# Patient Record
Sex: Male | Born: 1945
Health system: Southern US, Community
[De-identification: ages and names within clinical notes are randomized; demographics above are authoritative.]

## PROBLEM LIST (undated history)

## (undated) DIAGNOSIS — C4491 Basal cell carcinoma of skin, unspecified: Secondary | ICD-10-CM

## (undated) DIAGNOSIS — K219 Gastro-esophageal reflux disease without esophagitis: Secondary | ICD-10-CM

## (undated) DIAGNOSIS — Z85528 Personal history of other malignant neoplasm of kidney: Secondary | ICD-10-CM

## (undated) DIAGNOSIS — T7840XA Allergy, unspecified, initial encounter: Secondary | ICD-10-CM

## (undated) DIAGNOSIS — I10 Essential (primary) hypertension: Secondary | ICD-10-CM

## (undated) HISTORY — PX: TONSILLECTOMY: SUR1361

## (undated) HISTORY — PX: VASECTOMY: SHX75

## (undated) HISTORY — DX: Personal history of other malignant neoplasm of kidney: Z85.528

## (undated) HISTORY — PX: HERNIA REPAIR: SHX51

## (undated) HISTORY — PX: CATARACT EXTRACTION: SUR2

## (undated) HISTORY — DX: Allergy, unspecified, initial encounter: T78.40XA

## (undated) HISTORY — PX: KIDNEY SURGERY: SHX687

## (undated) HISTORY — DX: Essential (primary) hypertension: I10

## (undated) HISTORY — DX: Basal cell carcinoma of skin, unspecified: C44.91

## (undated) HISTORY — DX: Gastro-esophageal reflux disease without esophagitis: K21.9

---

## 2012-03-09 ENCOUNTER — Ambulatory Visit (INDEPENDENT_AMBULATORY_CARE_PROVIDER_SITE_OTHER): Payer: Medicare Other | Admitting: Internal Medicine

## 2012-03-09 ENCOUNTER — Ambulatory Visit: Payer: Medicare Other

## 2012-03-09 VITALS — BP 133/81 | HR 68 | Resp 18 | Ht >= 80 in | Wt 225.0 lb

## 2012-03-09 DIAGNOSIS — S0180XA Unspecified open wound of other part of head, initial encounter: Secondary | ICD-10-CM

## 2012-03-09 DIAGNOSIS — I10 Essential (primary) hypertension: Secondary | ICD-10-CM

## 2012-03-09 DIAGNOSIS — S0003XA Contusion of scalp, initial encounter: Secondary | ICD-10-CM

## 2012-03-09 DIAGNOSIS — Z23 Encounter for immunization: Secondary | ICD-10-CM

## 2012-03-09 DIAGNOSIS — S0083XA Contusion of other part of head, initial encounter: Secondary | ICD-10-CM

## 2012-03-09 IMAGING — CR DG NASAL BONES 3+V
2 series · 2 of 2 positions shown · non-contrast
Comparison: None.

CLINICAL DATA: Fall today.  Nasal laceration.

NASAL BONES - 3+ VIEW

[ap waters]
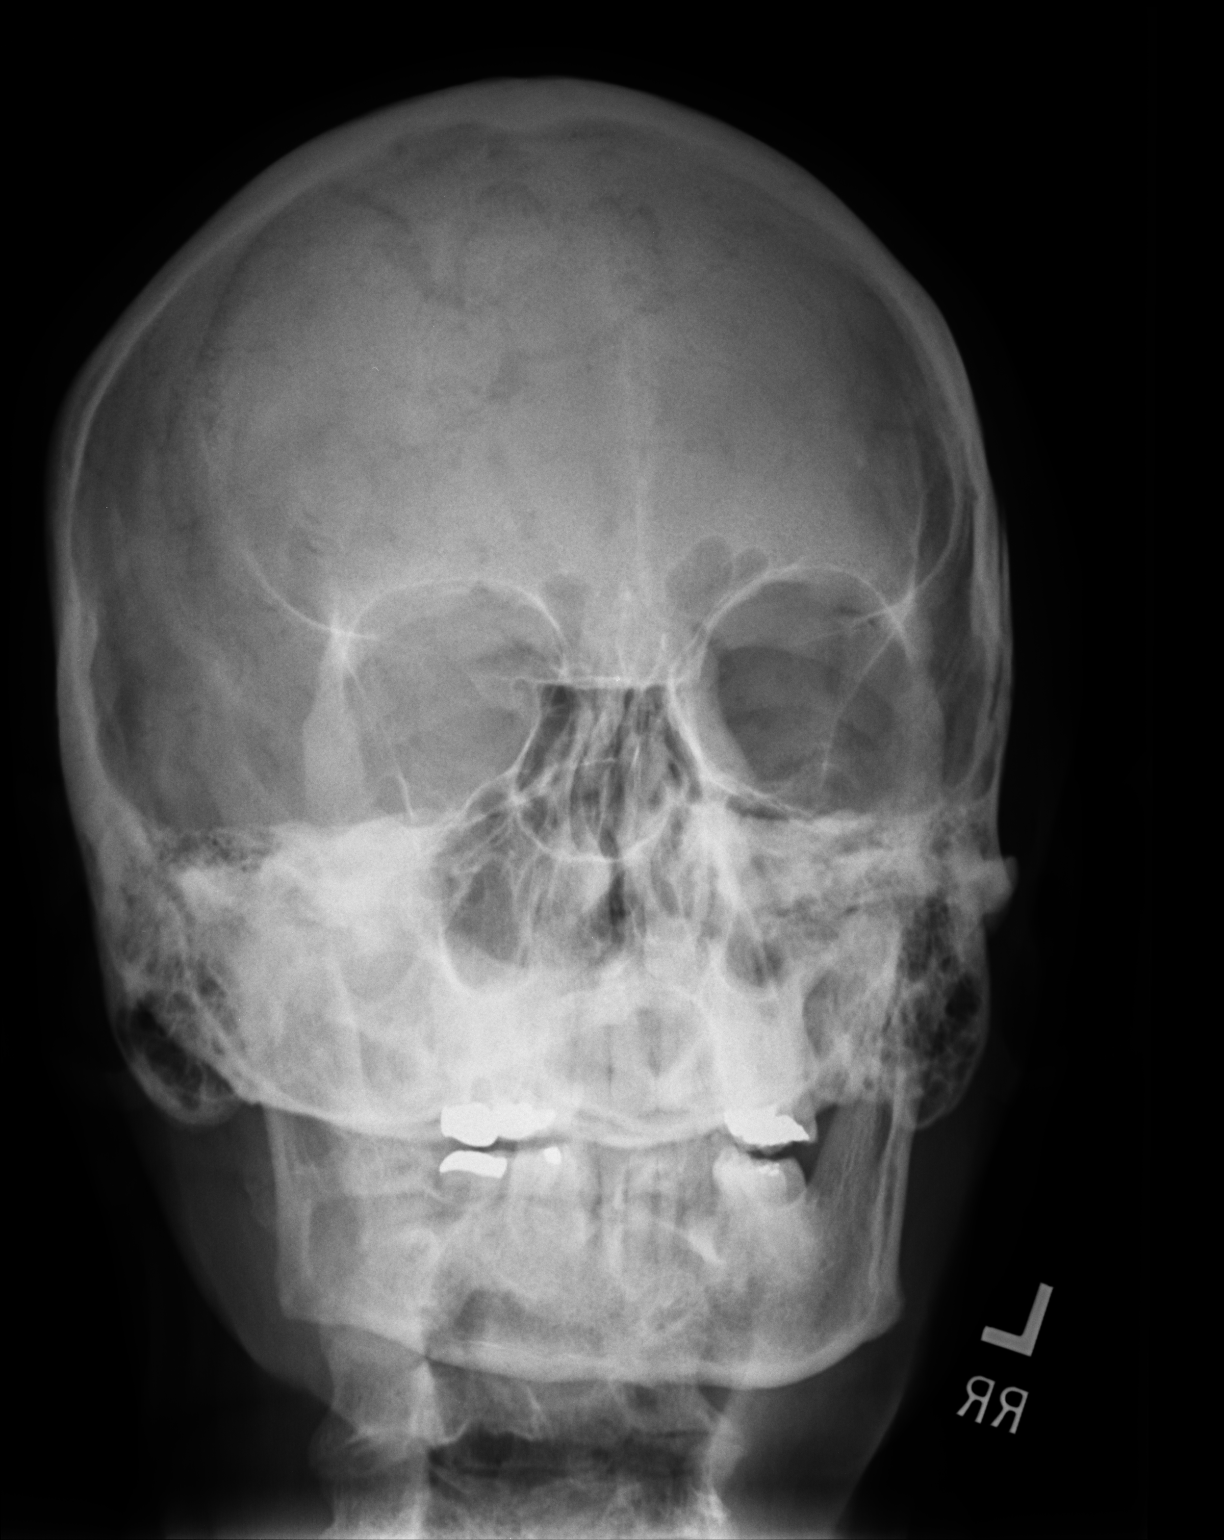

[lateral]
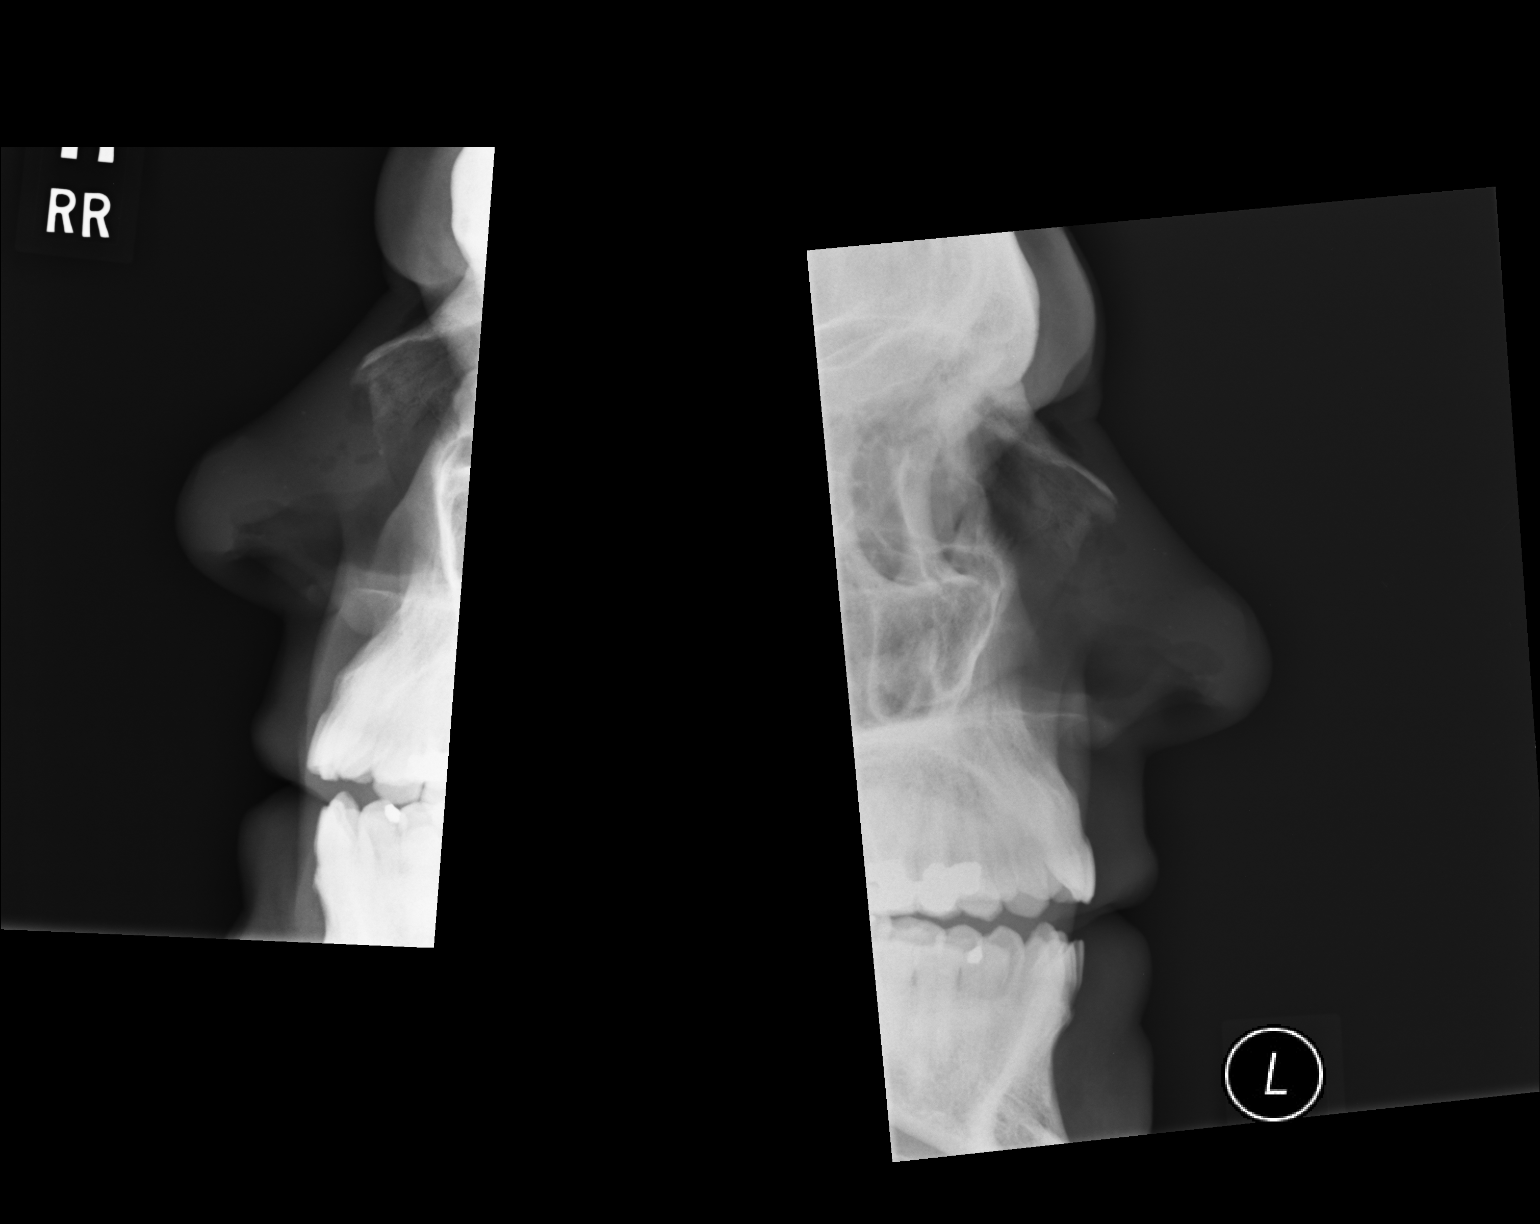

[2 of 2 positions shown; findings below may reference images not displayed]

FINDINGS: There is no evidence of acute fracture.  There are
lucencies within the soft tissues of the nose which may be related
to the laceration.  In addition, there are small radiodensities
which could reflect small foreign bodies or calcifications.
Visualized paranasal sinuses are clear.
IMPRESSION: No acute osseous findings. Apparent soft tissue emphysema and
calcifications or foreign bodies in the soft tissues of the nose.

## 2012-03-09 MED ORDER — HYDROCODONE-ACETAMINOPHEN 5-500 MG PO TABS: 1.0000 | ORAL_TABLET | Freq: Three times a day (TID) | ORAL | Status: DC | PRN

## 2012-03-09 NOTE — Progress Notes (Signed)
Patient ID: TRAMAIN GERSHMAN MRN: [id number], DOB: May 18, 1945, 66 y.o. Date of Encounter: 03/09/2012, 6:07 PM   PROCEDURE NOTE: Verbal consent obtained. Sterile technique employed. Numbing: Anesthesia obtained with 2% lidocaine plain.  Cleansed with soap and water. Irrigated.  Wound explored, no deep structures involved, no foreign bodies.   Wound repaired with # 6 SI and smaller inferior wound with #1 SI.   Hemostasis obtained. Wound cleansed and dressed.  Wound care instructions including precautions covered with patient. Handout given.  Anticipate suture removal in 10 days.   Rhoderick Moody, PA-C 03/09/2012 6:07 PM

## 2012-03-09 NOTE — Patient Instructions (Addendum)
Wound Care Wound care helps prevent pain and infection.  You may need a tetanus shot if:  You cannot remember when you had your last tetanus shot.  You have never had a tetanus shot.  The injury broke your skin. If you need a tetanus shot and you choose not to have one, you may get tetanus. Sickness from tetanus can be serious. HOME CARE   Only take medicine as told by your doctor.  Clean the wound daily with mild soap and water.  Change any bandages (dressings) as told by your doctor.  Put medicated cream and a bandage on the wound as told by your doctor.  Change the bandage if it gets wet, dirty, or starts to smell.  Take showers. Do not take baths, swim, or do anything that puts your wound under water.  Rest and raise (elevate) the wound until the pain and puffiness (swelling) are better.  Keep all doctor visits as told. GET HELP RIGHT AWAY IF:   Yellowish-white fluid (pus) comes from the wound.  Medicine does not lessen your pain.  There is a red streak going away from the wound.  You have a fever. MAKE SURE YOU:   Understand these instructions.  Will watch your condition.  Will get help right away if you are not doing well or get worse. Document Released: 02/07/2008 Document Revised: 07/23/2011 Document Reviewed: 09/03/2010 [facility] Patient Information 2013 [place], [place]. Head Injury, Adult You have had a head injury that does not appear serious at this time. A concussion is a state of changed mental ability, usually from a blow to the head. You should take clear liquids for the rest of the day and then resume your regular diet. You should not take sedatives or alcoholic beverages for as long as directed by your caregiver after discharge. After injuries such as yours, most problems occur within the first 24 hours. SYMPTOMS These minor symptoms may be experienced after discharge:  Memory difficulties.  Dizziness.  Headaches.  Double  vision.  Hearing difficulties.  Depression.  Tiredness.  Weakness.  Difficulty with concentration. If you experience any of these problems, you should not be alarmed. A concussion requires a few days for recovery. Many patients with head injuries frequently experience such symptoms. Usually, these problems disappear without medical care. If symptoms last for more than one day, notify your caregiver. See your caregiver sooner if symptoms are becoming worse rather than better. HOME CARE INSTRUCTIONS   During the next 24 hours you must stay with someone who can watch you for the warning signs listed below. Although it is unlikely that serious side effects will occur, you should be aware of signs and symptoms which may necessitate your return to this location. Side effects may occur up to 7  10 days following the injury. It is important for you to carefully monitor your condition and contact your caregiver or seek immediate medical attention if there is a change in your condition. SEEK IMMEDIATE MEDICAL CARE IF:   There is confusion or drowsiness.  You can not awaken the injured person.  There is nausea (feeling sick to your stomach) or continued, forceful vomiting.  You notice dizziness or unsteadiness which is getting worse, or inability to walk.  You have convulsions or unconsciousness.  You experience severe, persistent headaches not relieved by over-the-counter or prescription medicines for pain. (Do not take aspirin as this impairs clotting abilities). Take other pain medications only as directed.  You can not use arms  or legs normally.  There is clear or bloody discharge from the nose or ears. MAKE SURE YOU:   Understand these instructions.  Will watch your condition.  Will get help right away if you are not doing well or get worse. Document Released: 04/30/2005 Document Revised: 07/23/2011 Document Reviewed: 03/18/2009 [facility] Patient Information 2013 [place],  [place].

## 2012-03-09 NOTE — Progress Notes (Signed)
  Subjective:    Patient ID: Brandon [name], male    DOB: 08/13/1945, 66 y.o.   MRN: [id number]  HPI Tripped and fell on face today, End of nose has stellate wound. Nose does not appear deformed to family.   Review of Systems     Objective:   Physical Exam  Constitutional: He is oriented to person, place, and time. He appears well-developed and well-nourished. He appears distressed.  HENT:  Head: Head is with abrasion, with contusion and with laceration.    Right Ear: External ear normal.  Left Ear: External ear normal.  Mouth/Throat: Oropharynx is clear and moist.       Stellate wound face/nose  Eyes: EOM are normal. Pupils are equal, round, and reactive to light.  Neck: Normal range of motion. Neck supple.  Cardiovascular: Normal rate, regular rhythm and normal heart sounds.   Pulmonary/Chest: Effort normal and breath sounds normal.  Abdominal: Soft. There is no tenderness.  Musculoskeletal: Normal range of motion. He exhibits edema and tenderness.  Neurological: He is alert and oriented to person, place, and time. No cranial nerve deficit. He exhibits normal muscle tone. He displays a negative Romberg sign. Coordination and gait normal.       No drift  Balance good for him  Skin: Abrasion, ecchymosis and laceration noted.     Psychiatric: He has a normal mood and affect.  Wound repair by Ms. Marte PA  [facility] reading (PRIMARY) by  Dr.Leily Capek.no fx seen       Assessment & Plan:  Multiple contusions and abrasions Stellate laceration of nose

## 2015-11-21 DIAGNOSIS — H905 Unspecified sensorineural hearing loss: Secondary | ICD-10-CM | POA: Diagnosis not present

## 2015-11-21 DIAGNOSIS — R69 Illness, unspecified: Secondary | ICD-10-CM | POA: Diagnosis not present

## 2015-11-30 ENCOUNTER — Encounter: Payer: Self-pay | Admitting: Adult Health

## 2015-11-30 ENCOUNTER — Ambulatory Visit (INDEPENDENT_AMBULATORY_CARE_PROVIDER_SITE_OTHER): Payer: Commercial Managed Care - HMO | Admitting: Adult Health

## 2015-11-30 VITALS — BP 158/84 | Temp 98.1°F | Ht >= 80 in | Wt 239.6 lb

## 2015-11-30 DIAGNOSIS — Z7689 Persons encountering health services in other specified circumstances: Secondary | ICD-10-CM

## 2015-11-30 DIAGNOSIS — H6122 Impacted cerumen, left ear: Secondary | ICD-10-CM

## 2015-11-30 DIAGNOSIS — I10 Essential (primary) hypertension: Secondary | ICD-10-CM | POA: Diagnosis not present

## 2015-11-30 DIAGNOSIS — Z7189 Other specified counseling: Secondary | ICD-10-CM | POA: Diagnosis not present

## 2015-11-30 DIAGNOSIS — R351 Nocturia: Secondary | ICD-10-CM

## 2015-11-30 DIAGNOSIS — Z1211 Encounter for screening for malignant neoplasm of colon: Secondary | ICD-10-CM | POA: Diagnosis not present

## 2015-11-30 MED ORDER — LISINOPRIL-HYDROCHLOROTHIAZIDE 20-25 MG PO TABS: 1.0000 | ORAL_TABLET | Freq: Every day | ORAL | Status: DC

## 2015-11-30 MED ORDER — DOXAZOSIN MESYLATE 1 MG PO TABS: 1.0000 mg | ORAL_TABLET | Freq: Every day | ORAL | Status: DC

## 2015-11-30 NOTE — Progress Notes (Addendum)
Patient presents to clinic today to establish care. He is pleasant 70 year old male who  has a past medical history of Diabetes mellitus without complication ([place]); GERD (gastroesophageal reflux disease); Allergy; and Hypertension.  His last physical was two years ago.   Acute Concerns: Establish Care  Chronic Issues: DM  - Denies ever been on anything for diabetes, states " I think I am pre diabetic".   Hypertension  - He is not taking any medication currently due to running out. He felt as though it was well controlled when he was taking it.   BP Readings from Last 3 Encounters:  11/30/15 158/84  03/09/12 133/81   Nocturia - He has been getting up more frequent throughout the night and has found himself urinating more efoten throughout the day. He tried Flomax in the past and did not have good results with itr  Health Maintenance: Dental -- Does not see [facility] --Within Last five years Immunizations -- Will request from [facility].  Colonoscopy -- Unknown    Past Medical History  Diagnosis Date  . Diabetes mellitus without complication ([place])   . GERD (gastroesophageal reflux disease)   . Allergy   . Hypertension     No past surgical history on file.  Current Outpatient Prescriptions on File Prior to Visit  Medication Sig Dispense Refill  . lisinopril-hydrochlorothiazide (PRINZIDE,ZESTORETIC) 20-25 MG per tablet Take 1 tablet by mouth daily. Reported on 11/30/2015     No current facility-administered medications on file prior to visit.    No Known Allergies  Family History  Problem Relation Age of Onset  . Cancer Mother   . Cancer Father     Social History   Social History  . Marital Status: Married    Spouse Name: N/A  . Number of Children: N/A  . Years of Education: N/A   Occupational History  . Not on file.   Social History Main Topics  . Smoking status: Former Research scientist (life sciences)  . Smokeless tobacco: Not on file  . Alcohol Use: 0.0 oz/week    0 Standard  drinks or equivalent per week     Comment: "Beer every once in a while"  . Drug Use: Not on file  . Sexual Activity: Not on file   Other Topics Concern  . Not on file   Social History Narrative  . No narrative on file    Review of Systems  Constitutional: Negative.   HENT: Positive for hearing loss.   Eyes: Negative.   Respiratory: Negative.   Cardiovascular: Negative.   Gastrointestinal: Negative.   Genitourinary: Negative.   Neurological: Negative.   Psychiatric/Behavioral: Negative.   All other systems reviewed and are negative.   BP 158/84 mmHg  Temp(Src) 98.1 F (36.7 C) (Oral)  Ht 6\' 8"  (2.032 m)  Wt 239 lb 9.6 oz (108.682 kg)  BMI 26.32 kg/m2  Physical Exam  Constitutional: He is oriented to person, place, and time and well-developed, well-nourished, and in no distress. No distress.  HENT:  Head: Normocephalic and atraumatic.  Right Ear: External ear normal.  Left Ear: External ear normal.  Nose: Nose normal.  Mouth/Throat: Oropharynx is clear and moist. No oropharyngeal exudate.  Cerumen impaction in left ear  Cardiovascular: Normal rate, regular rhythm and intact distal pulses.  Exam reveals no gallop and no friction rub.   Murmur (soft dystolic murmer heard best at apex at sternal boarder) heard. Pulmonary/Chest: Effort normal and breath sounds normal. No respiratory distress. He has no wheezes.  He has no rales. He exhibits no tenderness.  Neurological: He is alert and oriented to person, place, and time. Gait normal. GCS score is 15.  Skin: Skin is warm and dry. No rash noted. He is not diaphoretic. No erythema. No pallor.  Psychiatric: Mood, memory, affect and judgment normal.  Nursing note and vitals reviewed.   Assessment/Plan: 1. Encounter to establish care - Follow up for CPE - Follow up sooner if needed - Work on diet and exercise  2. Colon cancer screening - Ambulatory referral to Gastroenterology  3. Essential hypertension -  lisinopril-hydrochlorothiazide (PRINZIDE,ZESTORETIC) 20-25 MG tablet; Take 1 tablet by mouth daily. Reported on 11/30/2015  Dispense: 90 tablet; Refill: 1 - Monitor BP at home.  - return precautions given  - Will follow up with at CPE 4. Cerumen impaction, left - Cerumen easily removed with irrigation   5. Nocturia - doxazosin (CARDURA) 1 MG tablet; Take 1 tablet (1 mg total) by mouth daily.  Dispense: 30 tablet; Refill: 3 Dorothyann Peng, NP

## 2015-11-30 NOTE — Patient Instructions (Signed)
It was great meeting you today!  I have sent in a prescription for the blood pressure medication you were taking. Take this daily.   Someone will call you to schedule your appointment for the colonoscopy  Schedule your exam for the physical

## 2015-12-05 ENCOUNTER — Encounter: Payer: Self-pay | Admitting: Gastroenterology

## 2015-12-13 ENCOUNTER — Encounter: Payer: Self-pay | Admitting: Adult Health

## 2016-01-20 ENCOUNTER — Ambulatory Visit (INDEPENDENT_AMBULATORY_CARE_PROVIDER_SITE_OTHER)
Admission: RE | Admit: 2016-01-20 | Discharge: 2016-01-20 | Disposition: A | Discharge status: Home / Self Care | Payer: Commercial Managed Care - HMO | Source: Ambulatory Visit | Attending: Adult Health | Admitting: Adult Health

## 2016-01-20 ENCOUNTER — Encounter: Payer: Self-pay | Admitting: Adult Health

## 2016-01-20 ENCOUNTER — Ambulatory Visit (INDEPENDENT_AMBULATORY_CARE_PROVIDER_SITE_OTHER): Payer: Commercial Managed Care - HMO | Admitting: Adult Health

## 2016-01-20 VITALS — BP 142/88 | HR 69 | Temp 98.1°F | Ht 78.0 in | Wt 235.2 lb

## 2016-01-20 DIAGNOSIS — R351 Nocturia: Secondary | ICD-10-CM | POA: Diagnosis not present

## 2016-01-20 DIAGNOSIS — Z0000 Encounter for general adult medical examination without abnormal findings: Secondary | ICD-10-CM

## 2016-01-20 DIAGNOSIS — R29898 Other symptoms and signs involving the musculoskeletal system: Secondary | ICD-10-CM

## 2016-01-20 DIAGNOSIS — I10 Essential (primary) hypertension: Secondary | ICD-10-CM

## 2016-01-20 DIAGNOSIS — M47812 Spondylosis without myelopathy or radiculopathy, cervical region: Secondary | ICD-10-CM | POA: Diagnosis not present

## 2016-01-20 DIAGNOSIS — Z23 Encounter for immunization: Secondary | ICD-10-CM | POA: Diagnosis not present

## 2016-01-20 DIAGNOSIS — R011 Cardiac murmur, unspecified: Secondary | ICD-10-CM | POA: Diagnosis not present

## 2016-01-20 LAB — LIPID PANEL
Cholesterol: 165 mg/dL (ref 0–200)
HDL: 38.3 mg/dL — ABNORMAL LOW (ref >39.00)
LDL Cholesterol: 116 mg/dL — ABNORMAL HIGH (ref 0–99)
NONHDL: 126.3
Total CHOL/HDL Ratio: 4
Triglycerides: 53 mg/dL (ref 0.0–149.0)
VLDL: 10.6 mg/dL (ref 0.0–40.0)

## 2016-01-20 LAB — CBC WITH DIFFERENTIAL/PLATELET
BASOS PCT: 0.6 % (ref 0.0–3.0)
Basophils Absolute: 0 10*3/uL (ref 0.0–0.1)
EOS ABS: 0 10*3/uL (ref 0.0–0.7)
EOS PCT: 1 % (ref 0.0–5.0)
HCT: 43.4 % (ref 39.0–52.0)
HEMOGLOBIN: 14.8 g/dL (ref 13.0–17.0)
LYMPHS PCT: 22.1 % (ref 12.0–46.0)
Lymphs Abs: 1.1 10*3/uL (ref 0.7–4.0)
MCHC: 34.1 g/dL (ref 30.0–36.0)
MCV: 88 fl (ref 78.0–100.0)
Monocytes Absolute: 0.3 10*3/uL (ref 0.1–1.0)
Monocytes Relative: 5.7 % (ref 3.0–12.0)
Neutro Abs: 3.6 10*3/uL (ref 1.4–7.7)
Neutrophils Relative %: 70.6 % (ref 43.0–77.0)
Platelets: 161 10*3/uL (ref 150.0–400.0)
RBC: 4.93 Mil/uL (ref 4.22–5.81)
RDW: 14.8 % (ref 11.5–15.5)
WBC: 5.1 10*3/uL (ref 4.0–10.5)

## 2016-01-20 LAB — POC URINALSYSI DIPSTICK (AUTOMATED)
Bilirubin, UA: NEGATIVE
Blood, UA: NEGATIVE
Glucose, UA: NEGATIVE
Ketones, UA: NEGATIVE
LEUKOCYTES UA: NEGATIVE
NITRITE UA: NEGATIVE
PH UA: 6
Spec Grav, UA: 1.02
UROBILINOGEN UA: 1

## 2016-01-20 LAB — BASIC METABOLIC PANEL
BUN: 24 mg/dL — ABNORMAL HIGH (ref 6–23)
CHLORIDE: 103 meq/L (ref 96–112)
CO2: 33 mEq/L — ABNORMAL HIGH (ref 19–32)
Calcium: 8.7 mg/dL (ref 8.4–10.5)
Creatinine, Ser: 1.07 mg/dL (ref 0.40–1.50)
GFR: 72.07 mL/min (ref >60.00)
Glucose, Bld: 105 mg/dL — ABNORMAL HIGH (ref 70–99)
POTASSIUM: 3.7 meq/L (ref 3.5–5.1)
Sodium: 140 mEq/L (ref 135–145)

## 2016-01-20 LAB — HEPATIC FUNCTION PANEL
ALBUMIN: 3.9 g/dL (ref 3.5–5.2)
ALK PHOS: 56 U/L (ref 39–117)
ALT: 19 U/L (ref 0–53)
AST: 22 U/L (ref 0–37)
Bilirubin, Direct: 0.2 mg/dL (ref 0.0–0.3)
TOTAL PROTEIN: 6.4 g/dL (ref 6.0–8.3)
Total Bilirubin: 1 mg/dL (ref 0.2–1.2)

## 2016-01-20 LAB — TSH: TSH: 1.24 u[IU]/mL (ref 0.35–4.50)

## 2016-01-20 LAB — HEMOGLOBIN A1C: Hgb A1c MFr Bld: 5.8 % (ref 4.6–6.5)

## 2016-01-20 IMAGING — DX DG CERVICAL SPINE COMPLETE 4+V
5 series · 5 of 5 positions shown · non-contrast
Comparison: None.

CLINICAL DATA: Chronic limited range of motion.  No known injury.

EXAM:
CERVICAL SPINE - COMPLETE 4+ VIEW

[c-spine lat]
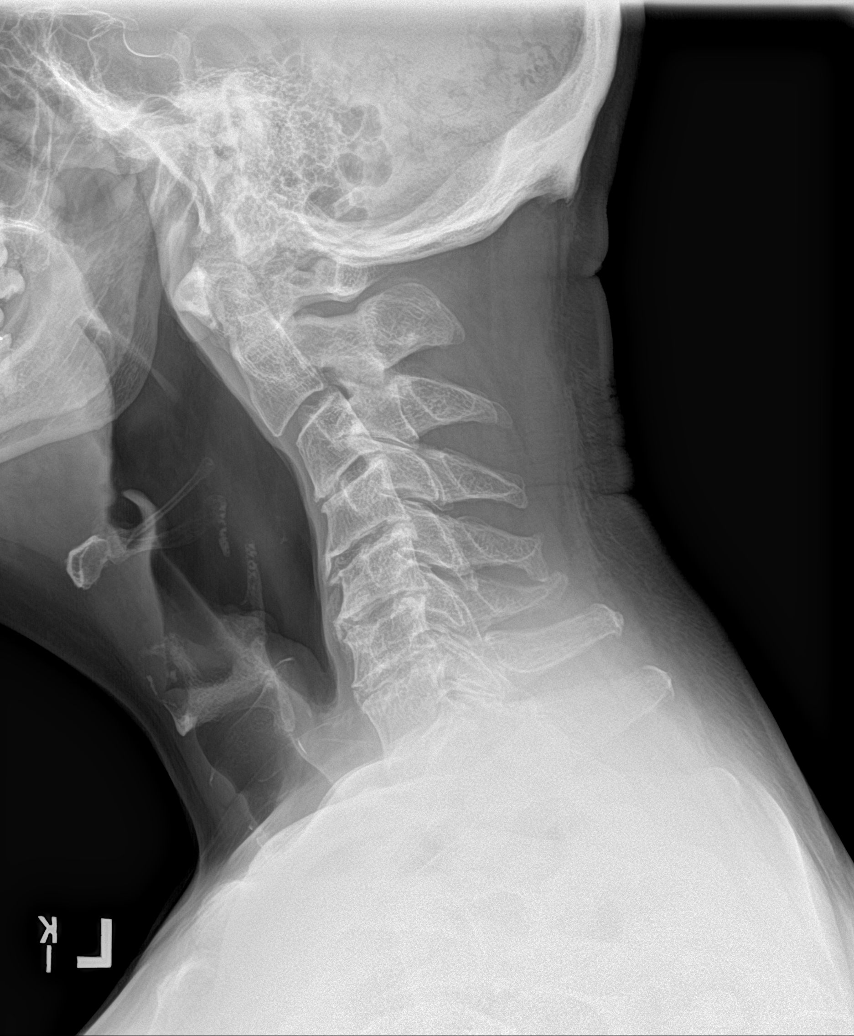

[c-spine obl (1 of 2)]
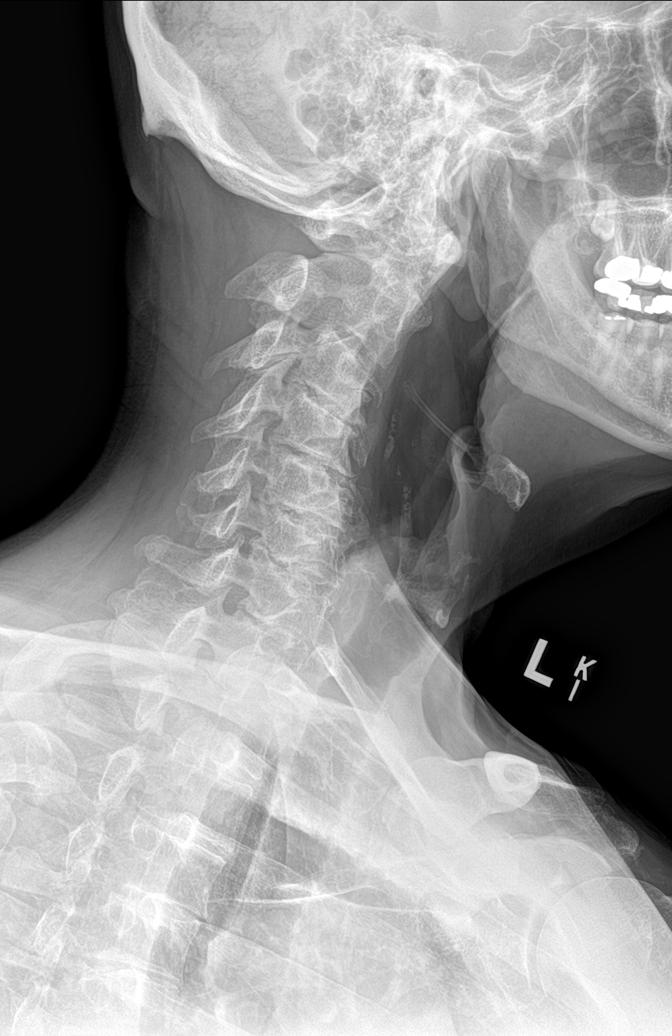

[c-spine obl (2 of 2)]
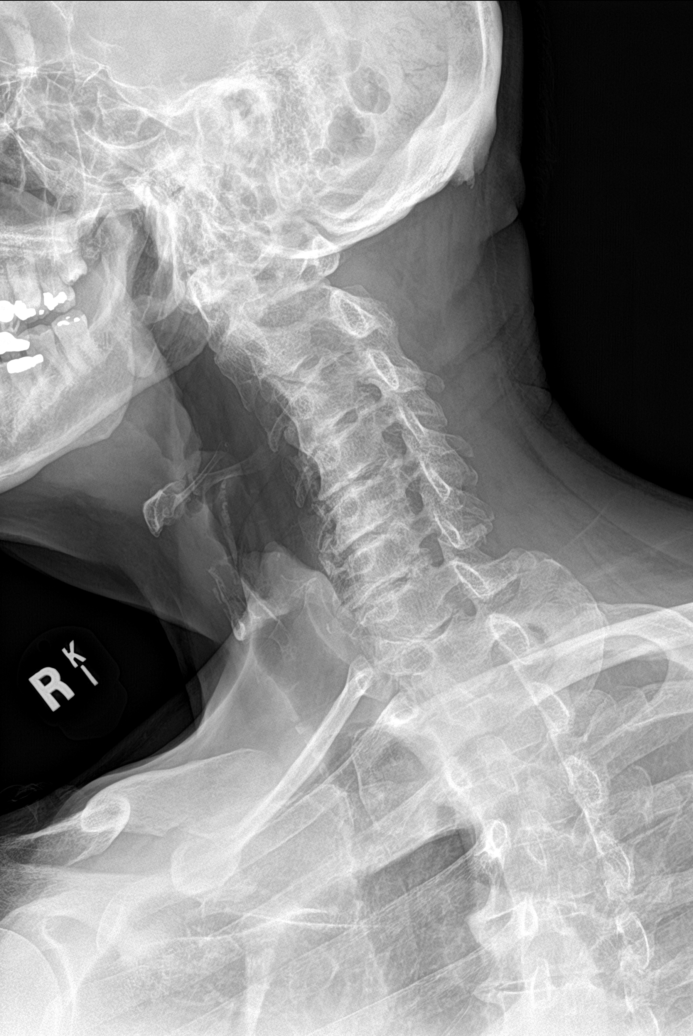

[c-spine ap]
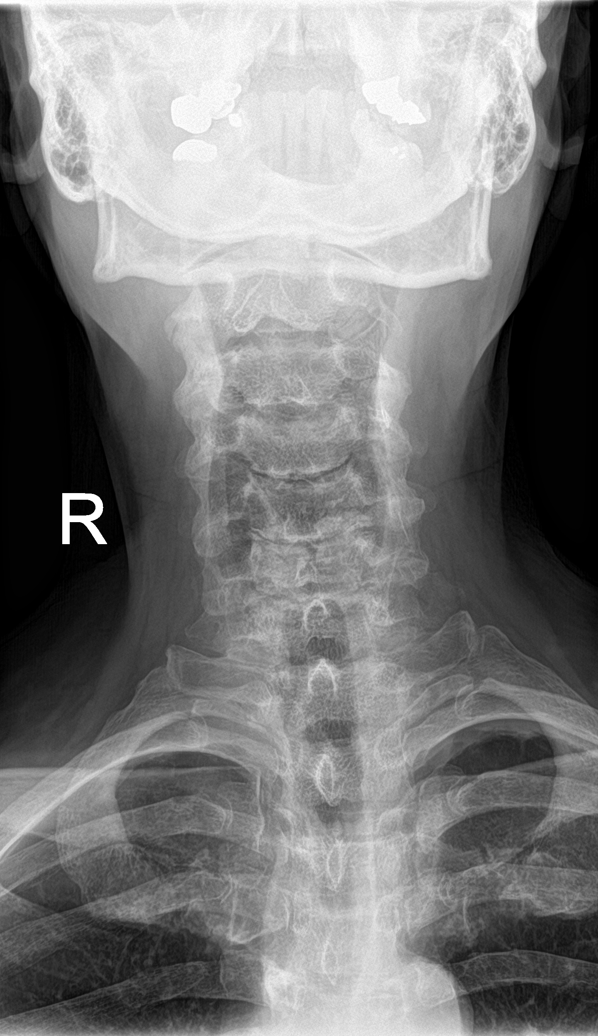

[c-spine open mouth]
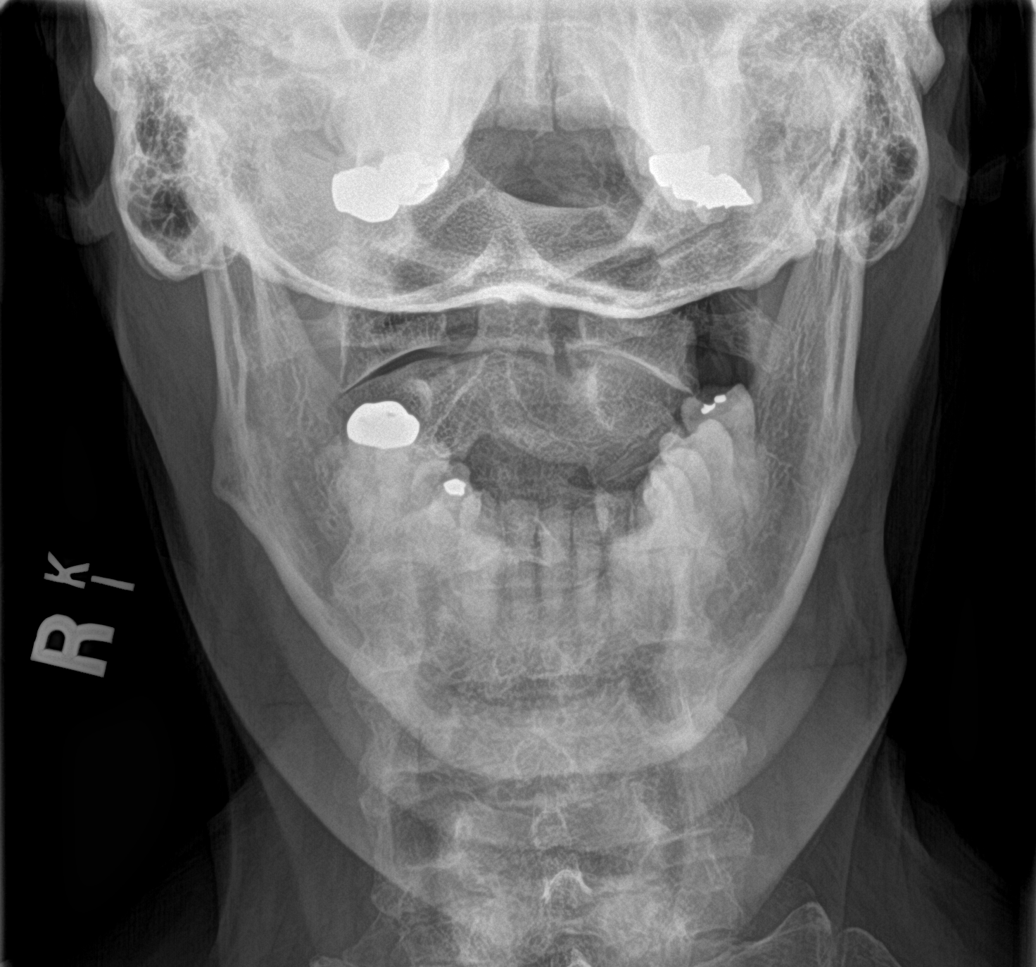

[5 of 5 positions shown; findings below may reference images not displayed]

FINDINGS: Diffuse degenerative disc disease throughout the cervical spine from
C3-4 through C7-T1. Degenerative facet disease bilaterally. Mild to
moderate multilevel neural foraminal narrowing, right greater than
left. No fracture. Loss of normal cervical lordosis. Prevertebral
soft tissues are normal.
IMPRESSION: Loss of cervical lordosis.

Diffuse spondylosis with bilateral multi level neural foraminal
narrowing, right greater than left.

No acute findings.

## 2016-01-20 MED ORDER — DOXAZOSIN MESYLATE 1 MG PO TABS: 1.0000 mg | ORAL_TABLET | Freq: Every day | ORAL | 1 refills | Status: DC

## 2016-01-20 NOTE — Progress Notes (Signed)
Pre visit review using our clinic review tool, if applicable. No additional management support is needed unless otherwise documented below in the visit note. 

## 2016-01-20 NOTE — Patient Instructions (Addendum)
It was great seeing you today!  I will follow up with you regarding your blood work   Someone from Cardiology will call you to schedule your appointment   Please go to the [place] office for your x ray   Health Maintenance, Male A healthy lifestyle and preventative care can promote health and wellness.  Maintain regular health, dental, and eye exams.  Eat a healthy diet. Foods like vegetables, fruits, whole grains, low-fat dairy products, and lean protein foods contain the nutrients you need and are low in calories. Decrease your intake of foods high in solid fats, added sugars, and salt. Get information about a proper diet from your health care provider, if necessary.  Regular physical exercise is one of the most important things you can do for your health. Most adults should get at least 150 minutes of moderate-intensity exercise (any activity that increases your heart rate and causes you to sweat) each week. In addition, most adults need muscle-strengthening exercises on 2 or more days a week.   Maintain a healthy weight. The body mass index (BMI) is a screening tool to identify possible weight problems. It provides an estimate of body fat based on height and weight. Your health care provider can find your BMI and can help you achieve or maintain a healthy weight. For males 20 years and older:  A BMI below 18.5 is considered underweight.  A BMI of 18.5 to 24.9 is normal.  A BMI of 25 to 29.9 is considered overweight.  A BMI of 30 and above is considered obese.  Maintain normal blood lipids and cholesterol by exercising and minimizing your intake of saturated fat. Eat a balanced diet with plenty of fruits and vegetables. Blood tests for lipids and cholesterol should begin at age 75 and be repeated every 5 years. If your lipid or cholesterol levels are high, you are over age 85, or you are at high risk for heart disease, you may need your cholesterol levels checked more frequently.Ongoing  high lipid and cholesterol levels should be treated with medicines if diet and exercise are not working.  If you smoke, find out from your health care provider how to quit. If you do not use tobacco, do not start.  Lung cancer screening is recommended for adults aged 74-80 years who are at high risk for developing lung cancer because of a history of smoking. A yearly low-dose CT scan of the lungs is recommended for people who have at least a 30-pack-year history of smoking and are current smokers or have quit within the past 15 years. A pack year of smoking is smoking an average of 1 pack of cigarettes a day for 1 year (for example, a 30-pack-year history of smoking could mean smoking 1 pack a day for 30 years or 2 packs a day for 15 years). Yearly screening should continue until the smoker has stopped smoking for at least 15 years. Yearly screening should be stopped for people who develop a health problem that would prevent them from having lung cancer treatment.  If you choose to drink alcohol, do not have more than 2 drinks per day. One drink is considered to be 12 oz (360 mL) of beer, 5 oz (150 mL) of wine, or 1.5 oz (45 mL) of liquor.  Avoid the use of street drugs. Do not share needles with anyone. Ask for help if you need support or instructions about stopping the use of drugs.  High blood pressure causes heart disease and increases  the risk of stroke. High blood pressure is more likely to develop in:  People who have blood pressure in the end of the normal range (100-139/85-89 mm Hg).  People who are overweight or obese.  People who are African American.  If you are 13-77 years of age, have your blood pressure checked every 3-5 years. If you are 31 years of age or older, have your blood pressure checked every year. You should have your blood pressure measured twice--once when you are at a hospital or clinic, and once when you are not at a hospital or clinic. Record the average of the two  measurements. To check your blood pressure when you are not at a hospital or clinic, you can use:  An automated blood pressure machine at a pharmacy.  A home blood pressure monitor.  If you are 19-31 years old, ask your health care provider if you should take aspirin to prevent heart disease.  Diabetes screening involves taking a blood sample to check your fasting blood sugar level. This should be done once every 3 years after age 17 if you are at a normal weight and without risk factors for diabetes. Testing should be considered at a younger age or be carried out more frequently if you are overweight and have at least 1 risk factor for diabetes.  Colorectal cancer can be detected and often prevented. Most routine colorectal cancer screening begins at the age of 34 and continues through age 44. However, your health care provider may recommend screening at an earlier age if you have risk factors for colon cancer. On a yearly basis, your health care provider may provide home test kits to check for hidden blood in the stool. A small camera at the end of a tube may be used to directly examine the colon (sigmoidoscopy or colonoscopy) to detect the earliest forms of colorectal cancer. Talk to your health care provider about this at age 48 when routine screening begins. A direct exam of the colon should be repeated every 5-10 years through age 6, unless early forms of precancerous polyps or small growths are found.  People who are at an increased risk for hepatitis B should be screened for this virus. You are considered at high risk for hepatitis B if:  You were born in a country where hepatitis B occurs often. Talk with your health care provider about which countries are considered high risk.  Your parents were born in a high-risk country and you have not received a shot to protect against hepatitis B (hepatitis B vaccine).  You have HIV or AIDS.  You use needles to inject street drugs.  You live  with, or have sex with, someone who has hepatitis B.  You are a man who has sex with other men (MSM).  You get hemodialysis treatment.  You take certain medicines for conditions like cancer, organ transplantation, and autoimmune conditions.  Hepatitis C blood testing is recommended for all people born from [birth year] through 1967 and any individual with known risk factors for hepatitis C.  Healthy men should no longer receive prostate-specific antigen (PSA) blood tests as part of routine cancer screening. Talk to your health care provider about prostate cancer screening.  Testicular cancer screening is not recommended for adolescents or adult males who have no symptoms. Screening includes self-exam, a health care provider exam, and other screening tests. Consult with your health care provider about any symptoms you have or any concerns you have about testicular cancer.  Practice  safe sex. Use condoms and avoid high-risk sexual practices to reduce the spread of sexually transmitted infections (STIs).  You should be screened for STIs, including gonorrhea and chlamydia if:  You are sexually active and are younger than 24 years.  You are older than 24 years, and your health care provider tells you that you are at risk for this type of infection.  Your sexual activity has changed since you were last screened, and you are at an increased risk for chlamydia or gonorrhea. Ask your health care provider if you are at risk.  If you are at risk of being infected with HIV, it is recommended that you take a prescription medicine daily to prevent HIV infection. This is called pre-exposure prophylaxis (PrEP). You are considered at risk if:  You are a man who has sex with other men (MSM).  You are a heterosexual man who is sexually active with multiple partners.  You take drugs by injection.  You are sexually active with a partner who has HIV.  Talk with your health care provider about whether you are at  high risk of being infected with HIV. If you choose to begin PrEP, you should first be tested for HIV. You should then be tested every 3 months for as long as you are taking PrEP.  Use sunscreen. Apply sunscreen liberally and repeatedly throughout the day. You should seek shade when your shadow is shorter than you. Protect yourself by wearing long sleeves, pants, a wide-brimmed hat, and sunglasses year round whenever you are outdoors.  Tell your health care provider of new moles or changes in moles, especially if there is a change in shape or color. Also, tell your health care provider if a mole is larger than the size of a pencil eraser.  A one-time screening for abdominal aortic aneurysm (AAA) and surgical repair of large AAAs by ultrasound is recommended for men aged 34-75 years who are current or former smokers.  Stay current with your vaccines (immunizations).   This information is not intended to replace advice given to you by your health care provider. Make sure you discuss any questions you have with your health care provider.   Document Released: 10/27/2007 Document Revised: 05/21/2014 Document Reviewed: 09/25/2010 Elsevier Interactive Patient Education [employer].

## 2016-01-20 NOTE — Progress Notes (Signed)
Called and left VM for pt to return call to office.  

## 2016-01-20 NOTE — Progress Notes (Signed)
Subjective:    Patient ID: Brandon [name], male    DOB: Apr 10, 1946, 70 y.o.   MRN: [id number]  HPI  Patient presents for yearly preventative medicine examination. He is a pleasant 70 year old male who  has a past medical history of Allergy; Diabetes mellitus without complication ([place]); GERD (gastroesophageal reflux disease); History of kidney cancer; and Hypertension.   All immunizations and health maintenance protocols were reviewed with the patient and needed orders were placed.  Appropriate screening laboratory values were ordered for the patient including screening of hyperlipidemia, renal function and hepatic function. If indicated by BPH, a PSA was ordered.  Medication reconciliation,  past medical history, social history, problem list and allergies were reviewed in detail with the patient  Goals were established with regard to weight loss, exercise, and  diet in compliance with medications  End of life planning was discussed.   Review of Systems  Constitutional: Negative.   HENT: Positive for hearing loss.   Eyes: Negative.   Respiratory: Negative.   Cardiovascular: Negative.   Gastrointestinal: Negative.   Genitourinary: Negative.   Musculoskeletal: Negative.   Skin: Negative.   Neurological: Negative.   Hematological: Negative.   Psychiatric/Behavioral: Negative.   All other systems reviewed and are negative.      Objective:   Physical Exam  Constitutional: He is oriented to person, place, and time. He appears well-developed and well-nourished. No distress.  HENT:  Head: Normocephalic and atraumatic.  Right Ear: External ear normal.  Left Ear: External ear normal.  Nose: Nose normal.  Mouth/Throat: Oropharynx is clear and moist. No oropharyngeal exudate.  Eyes: Conjunctivae and EOM are normal. Pupils are equal, round, and reactive to light. Right eye exhibits no discharge. Left eye exhibits no discharge.  Neck: Neck supple. Normal carotid pulses present.  Carotid bruit is not present. No tracheal deviation present. No thyroid mass and no thyromegaly present.  Loss of ROM with turning head to the left. This is a chroic issue. Denies any pain or  Tightness in his neck.   Cardiovascular: Normal rate, regular rhythm and intact distal pulses.  Exam reveals no gallop and no friction rub.   Murmur (soft, heard best at apex ) heard. Pulmonary/Chest: Effort normal and breath sounds normal. No respiratory distress. He has no wheezes. He has no rales. He exhibits no tenderness.  Abdominal: Soft. Bowel sounds are normal. He exhibits no distension and no mass. There is no tenderness. There is no rebound and no guarding.  Genitourinary: Penis normal. Rectal exam shows external hemorrhoid. Rectal exam shows no internal hemorrhoid, no mass, no tenderness, anal tone normal and guaiac negative stool. Prostate is enlarged. Prostate is not tender. No penile tenderness.  Musculoskeletal: Normal range of motion.  Lymphadenopathy:    He has no cervical adenopathy.  Neurological: He is alert and oriented to person, place, and time. He has normal reflexes. He displays normal reflexes. No cranial nerve deficit. He exhibits normal muscle tone. Coordination normal.  Skin: Skin is warm and dry. No rash noted. He is not diaphoretic. No erythema. No pallor.  Psychiatric: He has a normal mood and affect. His behavior is normal. Judgment and thought content normal.  Nursing note and vitals reviewed.     Assessment & Plan:  1. Routine general medical examination at a health care facility - Encouraged heart healthy diet and exercise - Follow up in one year or sooner if needed - POCT Urinalysis Dipstick (Automated) - Basic metabolic panel - CBC with  Differential/Platelet - Hemoglobin A1c - Hepatic function panel - Lipid panel - TSH  2. Heart murmur - He has not been told in the past that he has a heart murmur.  - EKG 12-Lead - Ambulatory referral to Cardiology  3.  Essential hypertension - Not at goal at this time. - Will continue to monitor  - POCT Urinalysis Dipstick (Automated) - Basic metabolic panel - CBC with Differential/Platelet - Hemoglobin A1c - Hepatic function panel - Lipid panel - TSH  4. Nocturia - doxazosin (CARDURA) 1 MG tablet; Take 1 tablet (1 mg total) by mouth daily.  Dispense: 90 tablet; Refill: 1  5. Decreased range of motion of neck - DG Cervical Spine Complete; Future 6. Need for prophylactic vaccination against Streptococcus pneumoniae (pneumococcus) - Pneumococcal conjugate vaccine 13-valent  7. Encounter for immunization - Flu vaccine HIGH DOSE PF  Dorothyann Peng, NP

## 2016-01-23 ENCOUNTER — Telehealth: Payer: Self-pay | Admitting: Adult Health

## 2016-01-23 NOTE — Telephone Encounter (Signed)
Updated patient on his x ray results. He will discuss findings with his wife and then will decide if he would like to see orthopedics

## 2016-01-24 ENCOUNTER — Encounter: Payer: Self-pay | Admitting: Adult Health

## 2016-01-30 ENCOUNTER — Ambulatory Visit (AMBULATORY_SURGERY_CENTER): Payer: Self-pay

## 2016-01-30 VITALS — Ht 78.0 in | Wt 236.0 lb

## 2016-01-30 DIAGNOSIS — Z1211 Encounter for screening for malignant neoplasm of colon: Secondary | ICD-10-CM

## 2016-01-30 MED ORDER — SUPREP BOWEL PREP KIT 17.5-3.13-1.6 GM/177ML PO SOLN: 1.0000 | Freq: Once | ORAL | 0 refills | Status: AC

## 2016-01-30 NOTE — Progress Notes (Signed)
No allergies to eggs or soy No past problems with anesthesia No diet meds No home oxygen  Declined emmi 

## 2016-02-01 ENCOUNTER — Encounter: Payer: Self-pay | Admitting: Gastroenterology

## 2016-02-03 ENCOUNTER — Encounter: Payer: Self-pay | Admitting: Gastroenterology

## 2016-02-13 ENCOUNTER — Other Ambulatory Visit: Payer: Self-pay | Admitting: *Deleted

## 2016-02-13 ENCOUNTER — Encounter: Payer: Commercial Managed Care - HMO | Admitting: Gastroenterology

## 2016-02-13 ENCOUNTER — Encounter: Payer: Self-pay | Admitting: Adult Health

## 2016-02-13 DIAGNOSIS — I10 Essential (primary) hypertension: Secondary | ICD-10-CM

## 2016-02-13 MED ORDER — LISINOPRIL-HYDROCHLOROTHIAZIDE 20-25 MG PO TABS: 1.0000 | ORAL_TABLET | Freq: Every day | ORAL | 1 refills | Status: DC

## 2016-02-13 NOTE — Telephone Encounter (Signed)
Rx done and the pt was notified via Mychart message. 

## 2016-02-14 ENCOUNTER — Ambulatory Visit: Payer: Commercial Managed Care - HMO | Admitting: Cardiovascular Disease

## 2016-02-17 ENCOUNTER — Ambulatory Visit (AMBULATORY_SURGERY_CENTER): Payer: Commercial Managed Care - HMO | Admitting: Gastroenterology

## 2016-02-17 ENCOUNTER — Encounter: Payer: Self-pay | Admitting: Gastroenterology

## 2016-02-17 VITALS — BP 131/73 | HR 59 | Temp 97.3°F | Resp 11 | Ht 78.0 in | Wt 236.0 lb

## 2016-02-17 DIAGNOSIS — Z1212 Encounter for screening for malignant neoplasm of rectum: Secondary | ICD-10-CM

## 2016-02-17 DIAGNOSIS — D125 Benign neoplasm of sigmoid colon: Secondary | ICD-10-CM

## 2016-02-17 DIAGNOSIS — Z1211 Encounter for screening for malignant neoplasm of colon: Secondary | ICD-10-CM | POA: Diagnosis present

## 2016-02-17 DIAGNOSIS — D123 Benign neoplasm of transverse colon: Secondary | ICD-10-CM

## 2016-02-17 MED ORDER — SODIUM CHLORIDE 0.9 % IV SOLN: 500.0000 mL | INTRAVENOUS | Status: DC

## 2016-02-17 NOTE — Patient Instructions (Addendum)
  NO NSAIDS FOR TWO WEEKS, 03/02/16. (MOTRIN, IBUPROFEN, ADVIL, ALEVE, NAPROSYN ETC)   YOU HAD AN ENDOSCOPIC PROCEDURE TODAY AT [facility]:   Refer to the procedure report that was given to you for any specific questions about what was found during the examination.  If the procedure report does not answer your questions, please call your gastroenterologist to clarify.  If you requested that your care partner not be given the details of your procedure findings, then the procedure report has been included in a sealed envelope for you to review at your convenience later.  YOU SHOULD EXPECT: Some feelings of bloating in the abdomen. Passage of more gas than usual.  Walking can help get rid of the air that was put into your GI tract during the procedure and reduce the bloating. If you had a lower endoscopy (such as a colonoscopy or flexible sigmoidoscopy) you may notice spotting of blood in your stool or on the toilet paper. If you underwent a bowel prep for your procedure, you may not have a normal bowel movement for a few days.  Please Note:  You might notice some irritation and congestion in your nose or some drainage.  This is from the oxygen used during your procedure.  There is no need for concern and it should clear up in a day or so.  SYMPTOMS TO REPORT IMMEDIATELY:   Following lower endoscopy (colonoscopy or flexible sigmoidoscopy):  Excessive amounts of blood in the stool  Significant tenderness or worsening of abdominal pains  Swelling of the abdomen that is new, acute  Fever of 100F or higher    For urgent or emergent issues, a gastroenterologist can be reached at any hour by calling [phone].   DIET:  We do recommend a small meal at first, but then you may proceed to your regular diet.  Drink plenty of fluids but you should avoid alcoholic beverages for 24 hours.  ACTIVITY:  You should plan to take it easy for the rest of today and you should NOT DRIVE or use  heavy machinery until tomorrow (because of the sedation medicines used during the test).    FOLLOW UP: Our staff will call the number listed on your records the next business day following your procedure to check on you and address any questions or concerns that you may have regarding the information given to you following your procedure. If we do not reach you, we will leave a message.  However, if you are feeling well and you are not experiencing any problems, there is no need to return our call.  We will assume that you have returned to your regular daily activities without incident.  If any biopsies were taken you will be contacted by phone or by letter within the next 1-3 weeks.  Please call us at [phone] if you have not heard about the biopsies in 3 weeks.    SIGNATURES/CONFIDENTIALITY: You and/or your care partner have signed paperwork which will be entered into your electronic medical record.  These signatures attest to the fact that that the information above on your After Visit Summary has been reviewed and is understood.  Full responsibility of the confidentiality of this discharge information lies with you and/or your care-partner.

## 2016-02-17 NOTE — Op Note (Signed)
[facility] Patient Name: Brandon [name] Procedure Date: 02/17/2016 11:01 AM MRN: [id number] Endoscopist: Remo Lipps P. Havery Moros , MD Age: 70 Referring MD:  Date of Birth: June 25, 1945 Gender: Male Account #: [id number] Procedure:                Colonoscopy Indications:              Screening for malignant neoplasm in the colon Medicines:                Monitored Anesthesia Care Procedure:                Pre-Anesthesia Assessment:                           - Prior to the procedure, a History and Physical                            was performed, and patient medications and                            allergies were reviewed. The patient's tolerance of                            previous anesthesia was also reviewed. The risks                            and benefits of the procedure and the sedation                            options and risks were discussed with the patient.                            All questions were answered, and informed consent                            was obtained. Prior Anticoagulants: The patient has                            taken no previous anticoagulant or antiplatelet                            agents. ASA Grade Assessment: II - A patient with                            mild systemic disease. After reviewing the risks                            and benefits, the patient was deemed in                            satisfactory condition to undergo the procedure.                           After obtaining informed consent, the colonoscope  was passed under direct vision. Throughout the                            procedure, the patient's blood pressure, pulse, and                            oxygen saturations were monitored continuously. The                            Model CF-HQ190L [id number]) scope was introduced                            through the anus and advanced to the the cecum,                            identified  by appendiceal orifice and ileocecal                            valve. The quality of the bowel preparation was                            good. The ileocecal valve, appendiceal orifice, and                            rectum were photographed. Scope In: 11:03:44 AM Scope Out: 11:32:40 AM Scope Withdrawal Time: 0 hours 21 minutes 40 seconds  Total Procedure Duration: 0 hours 28 minutes 56 seconds  Findings:                 The perianal exam findings include non-thrombosed                            external hemorrhoids.                           A single medium-sized angiodysplastic lesion was                            found in the transverse colon.                           The colon revealed excessive looping, it was                            technically challenging to intubate the cecum.                           Four sessile polyps were found in the transverse                            colon. The polyps were 3 to 8 mm in size. These                            polyps were removed with a cold snare. Resection  and retrieval were complete.                           A 6 mm polyp was found in the sigmoid colon. The                            polyp was pedunculated. The polyp was removed with                            a hot snare. Resection and retrieval were complete.                           Multiple medium-mouthed diverticula were found in                            the left colon.                           Non-bleeding internal hemorrhoids were found during                            retroflexion.                           The exam was otherwise without abnormality. Complications:            No immediate complications. Estimated blood loss:                            Minimal. Estimated Blood Loss:     Estimated blood loss was minimal. Impression:               - Large non-thrombosed external hemorrhoids found                            on perianal exam.                            - A single colonic angiodysplastic lesion.                           - There was significant looping of the colon.                           - Four 3 to 8 mm polyps in the transverse colon,                            removed with a cold snare. Resected and retrieved.                           - One 6 mm polyp in the sigmoid colon, removed with                            a hot snare. Resected and retrieved.                           -  Diverticulosis in the left colon.                           - Non-bleeding internal hemorrhoids.                           - The examination was otherwise normal. Recommendation:           - Patient has a contact number available for                            emergencies. The signs and symptoms of potential                            delayed complications were discussed with the                            patient. Return to normal activities tomorrow.                            Written discharge instructions were provided to the                            patient.                           - Resume previous diet.                           - Continue present medications.                           - No ibuprofen, naproxen, or other non-steroidal                            anti-inflammatory drugs for 2 weeks after polyp                            removal.                           - Await pathology results.                           - Consideration for hemorrhoid banding if                            hemorrhoids are causing symptoms.                           - Repeat colonoscopy is recommended for                            surveillance. The colonoscopy date will be                            determined after pathology results from today's  exam become available for review. Remo Lipps P. Armbruster, MD 02/17/2016 11:38:02 AM This report has been signed electronically.

## 2016-02-17 NOTE — Progress Notes (Signed)
A/ox3 pleased with MAC, report to Karen RN 

## 2016-02-17 NOTE — Progress Notes (Signed)
Called to room to assist during endoscopic procedure.  Patient ID and intended procedure confirmed with present staff. Received instructions for my participation in the procedure from the performing physician.  

## 2016-02-20 ENCOUNTER — Telehealth: Payer: Self-pay

## 2016-02-20 ENCOUNTER — Telehealth: Payer: Self-pay | Admitting: *Deleted

## 2016-02-20 NOTE — Telephone Encounter (Signed)
No answer, message left for the patient. 

## 2016-02-20 NOTE — Telephone Encounter (Signed)
  Follow up Call-  Call back number 02/17/2016  Post procedure Call Back phone  # [phone]  Permission to leave phone message Yes  Some recent data might be hidden    Patient was called for follow up after his procedure on 02/17/2016. No answer at the number given for follow up phone call. A message was left on the answering machine. This was the third attempt to contact the patient.

## 2016-02-20 NOTE — Telephone Encounter (Signed)
  Follow up Call-  Call back number 02/17/2016  Post procedure Call Back phone  # [phone]  Permission to leave phone message Yes  Some recent data might be hidden    Patient was called for follow up after his procedure on 02/17/2016. No answer at the number given for follow up phone call. A message was left on the answering machine. This was the second attempt to contact the patient.

## 2016-02-21 NOTE — Progress Notes (Signed)
Cardiology Office Note   Date:  02/22/2016   ID:  Brandon [name], DOB 16-Dec-1945, MRN [id number]  PCP:  Dorothyann Peng, NP  Cardiologist:   Skeet Latch, MD   Chief Complaint  Patient presents with  . New Patient (Initial Visit)  . Edema    pt states right foot a month ago       History of Present Illness: Brandon [name] is a 70 y.o. male with hypertension who presents for evaluation of a murmur.  [name] saw Dorothyann Peng, NP on 01/20/16.  At that appointment he was noted to have a murmur and was referred to cardiology for evaluation. At that appointment his blood pressure was poorly-controlled   He reports that he has been feeling well.  He and his wife exercise twice per week through the [employer] program.  He also does yard work regularly. Any palpitations, lightheadedness, or dizziness. He quit smoking in 1980. He denies chest pain or shortness of breath with this activity. Occasionally have swelling in his legs when the weather is hot outside. It improves with elevation. He denies orthopnea or PND.  He hasn't noted    Past Medical History:  Diagnosis Date  . Allergy   . Diabetes mellitus without complication ([place])   . GERD (gastroesophageal reflux disease)   . History of kidney cancer    benign; told not cancerous  . Hypertension     Past Surgical History:  Procedure Laterality Date  . CATARACT EXTRACTION Bilateral   . HERNIA REPAIR     x2  . KIDNEY SURGERY Right    Had right kidney removed.   . TONSILLECTOMY    . VASECTOMY       Current Outpatient Prescriptions  Medication Sig Dispense Refill  . Cyanocobalamin (VITAMIN B-12 PO)     . doxazosin (CARDURA) 1 MG tablet Take 1 tablet (1 mg total) by mouth daily. 90 tablet 1  . lisinopril-hydrochlorothiazide (PRINZIDE,ZESTORETIC) 20-25 MG tablet Take 1 tablet by mouth daily. Reported on 11/30/2015 90 tablet 1   Current Facility-Administered Medications  Medication Dose Route Frequency Provider Last  Rate Last Dose  . 0.9 %  sodium chloride infusion  500 mL Intravenous Continuous Manus Gunning, MD        Allergies:   Review of patient's allergies indicates no known allergies.    Social History:  The patient  reports that he has quit smoking. He has never used smokeless tobacco. He reports that he drinks alcohol. He reports that he does not use drugs.   Family History:  The patient's family history includes Cancer in his father and mother.    ROS:  Please see the history of present illness.   Otherwise, review of systems are positive for none.   All other systems are reviewed and negative.    PHYSICAL EXAM: VS:  BP 113/72   Pulse 70   Ht 6' (1.829 m)   Wt 236 lb 6.4 oz (107.2 kg)   SpO2 95%   BMI 32.06 kg/m  , BMI Body mass index is 32.06 kg/m. GENERAL:  Well appearing HEENT:  Pupils equal round and reactive, fundi not visualized, oral mucosa unremarkable NECK:  No jugular venous distention, waveform within normal limits, carotid upstroke brisk and symmetric, no bruits, no thyromegaly LYMPHATICS:  No cervical adenopathy LUNGS:  Clear to auscultation bilaterally HEART:  RRR.  PMI not displaced or sustained,S1 and S2 within normal limits, no S3, no S4, no clicks, no rubs,  I/VY holosystolic murmur at the apex ABD:  Flat, positive bowel sounds normal in frequency in pitch, no bruits, no rebound, no guarding, no midline pulsatile mass, no hepatomegaly, no splenomegaly EXT:  2 plus pulses throughout, no edema, no cyanosis no clubbing SKIN:  No rashes no nodules NEURO:  Cranial nerves II through XII grossly intact, motor grossly intact throughout PSYCH:  Cognitively intact, oriented to person place and time    EKG:  EKG is not ordered today. The ekg ordered 01/20/16 demonstrates sinus rhythm. Rate 64 bpm. RSR prime in lead V1.     Recent Labs: 01/20/2016: ALT 19; BUN 24; Creatinine, Ser 1.07; Hemoglobin 14.8; Platelets 161.0; Potassium 3.7; Sodium 140; TSH 1.24     Lipid Panel    Component Value Date/Time   CHOL 165 01/20/2016 0928   TRIG 53.0 01/20/2016 0928   HDL 38.30 (L) 01/20/2016 0928   CHOLHDL 4 01/20/2016 0928   VLDL 10.6 01/20/2016 0928   LDLCALC 116 (H) 01/20/2016 0928      Wt Readings from Last 3 Encounters:  02/22/16 236 lb 6.4 oz (107.2 kg)  02/17/16 236 lb (107 kg)  01/30/16 236 lb (107 kg)      ASSESSMENT AND PLAN:  #  Murmur: [name] has a murmur that seems to be consistent with mild mitral regurgitation. He is completely asymptomatic and has no physical exam findings concerning for heart failure.  We will obtain a baseline echocardiogram to better assess.   # Hypertension: Blood pressure well-controlled. Continue lisinopril, hydrochlorothiazide, and doxazosin.   Current medicines are reviewed at length with the patient today.  The patient  concerns regarding medicines.  The following changes have been made:  no change  Labs/ tests ordered today include:   Orders Placed This Encounter  Procedures  . ECHOCARDIOGRAM COMPLETE     Disposition:   FU with Sonni Barse C. Oval Linsey, MD, [facility] as needed.     This note was written with the assistance of speech recognition software.  Please excuse any transcriptional errors.  Signed, Leonte Horrigan C. Oval Linsey, MD, [facility]  02/22/2016 10:50 AM    [place]

## 2016-02-22 ENCOUNTER — Ambulatory Visit (INDEPENDENT_AMBULATORY_CARE_PROVIDER_SITE_OTHER): Payer: Commercial Managed Care - HMO | Admitting: Cardiovascular Disease

## 2016-02-22 ENCOUNTER — Encounter: Payer: Self-pay | Admitting: Cardiovascular Disease

## 2016-02-22 VITALS — BP 113/72 | HR 70 | Ht 72.0 in | Wt 236.4 lb

## 2016-02-22 DIAGNOSIS — R011 Cardiac murmur, unspecified: Secondary | ICD-10-CM

## 2016-02-22 DIAGNOSIS — I10 Essential (primary) hypertension: Secondary | ICD-10-CM | POA: Diagnosis not present

## 2016-02-22 NOTE — Patient Instructions (Signed)
Medication Instructions:  Your physician recommends that you continue on your current medications as directed. Please refer to the Current Medication list given to you today.  Labwork: none  Testing/Procedures: Your physician has requested that you have an echocardiogram. Echocardiography is a painless test that uses sound waves to create images of your heart. It provides your doctor with information about the size and shape of your heart and how well your heart's chambers and valves are working. This procedure takes approximately one hour. There are no restrictions for this procedure. [facility] [address]  Follow-Up: As needed

## 2016-02-23 ENCOUNTER — Encounter: Payer: Self-pay | Admitting: Gastroenterology

## 2016-03-12 ENCOUNTER — Ambulatory Visit (HOSPITAL_COMMUNITY): Payer: Commercial Managed Care - HMO | Attending: Cardiology

## 2016-03-12 ENCOUNTER — Other Ambulatory Visit: Payer: Self-pay

## 2016-03-12 DIAGNOSIS — I071 Rheumatic tricuspid insufficiency: Secondary | ICD-10-CM | POA: Insufficient documentation

## 2016-03-12 DIAGNOSIS — I341 Nonrheumatic mitral (valve) prolapse: Secondary | ICD-10-CM | POA: Diagnosis not present

## 2016-03-12 DIAGNOSIS — R011 Cardiac murmur, unspecified: Secondary | ICD-10-CM | POA: Insufficient documentation

## 2016-03-12 DIAGNOSIS — I340 Nonrheumatic mitral (valve) insufficiency: Secondary | ICD-10-CM | POA: Insufficient documentation

## 2016-03-14 ENCOUNTER — Telehealth: Payer: Self-pay | Admitting: Cardiovascular Disease

## 2016-03-14 NOTE — Telephone Encounter (Signed)
New message  ° ° °Patient calling back to speak with nurse  °

## 2016-03-14 NOTE — Telephone Encounter (Signed)
Advised patient

## 2016-06-18 ENCOUNTER — Other Ambulatory Visit: Payer: Self-pay | Admitting: Adult Health

## 2016-06-18 DIAGNOSIS — I10 Essential (primary) hypertension: Secondary | ICD-10-CM

## 2016-10-04 ENCOUNTER — Ambulatory Visit (INDEPENDENT_AMBULATORY_CARE_PROVIDER_SITE_OTHER): Payer: Commercial Managed Care - HMO | Admitting: Adult Health

## 2016-10-04 ENCOUNTER — Encounter: Payer: Self-pay | Admitting: Adult Health

## 2016-10-04 VITALS — BP 128/60 | Ht 72.0 in | Wt 247.0 lb

## 2016-10-04 DIAGNOSIS — H9193 Unspecified hearing loss, bilateral: Secondary | ICD-10-CM | POA: Diagnosis not present

## 2016-10-04 NOTE — Progress Notes (Signed)
Subjective:    Patient ID: Brandon [name], male    DOB: [birth year], 71 y.o.   MRN: [id number]  HPI  71 year old male who  has a past medical history of Allergy; Diabetes mellitus without complication ([place]); GERD (gastroesophageal reflux disease); History of kidney cancer; and Hypertension. He presents to the office today for what he believes is cerumen impaction in bilateral ears. He has an appointment with his hearing aid company tomorrow. Has found that it has been harder for him to hear lately.   Denies any ear pain or drainage    Review of Systems See HPI   Past Medical History:  Diagnosis Date  . Allergy   . Diabetes mellitus without complication ([place])   . GERD (gastroesophageal reflux disease)   . History of kidney cancer    benign; told not cancerous  . Hypertension     Social History   Social History  . Marital status: Married    Spouse name: N/A  . Number of children: N/A  . Years of education: N/A   Occupational History  . Not on file.   Social History Main Topics  . Smoking status: Former Research scientist (life sciences)  . Smokeless tobacco: Never Used  . Alcohol use 0.0 oz/week     Comment: "Beer every once in a while"  . Drug use: No  . Sexual activity: Not on file   Other Topics Concern  . Not on file   Social History Narrative   Retired from [employer]   Married    Three children (Two in [place] and one in [place])     Past Surgical History:  Procedure Laterality Date  . CATARACT EXTRACTION Bilateral   . HERNIA REPAIR     x2  . KIDNEY SURGERY Right    Had right kidney removed.   . TONSILLECTOMY    . VASECTOMY      Family History  Problem Relation Age of Onset  . Cancer Mother        Breast   . Cancer Father        Lung   . Colon cancer Neg Hx     No Known Allergies  Current Outpatient Prescriptions on File Prior to Visit  Medication Sig Dispense Refill  . Cyanocobalamin (VITAMIN B-12 PO)     . doxazosin (CARDURA) 1 MG tablet Take 1 tablet (1 mg  total) by mouth daily. 90 tablet 1  . lisinopril-hydrochlorothiazide (PRINZIDE,ZESTORETIC) 20-25 MG tablet TAKE 1 TABLET EVERY DAY 90 tablet 2   Current Facility-Administered Medications on File Prior to Visit  Medication Dose Route Frequency Provider Last Rate Last Dose  . 0.9 %  sodium chloride infusion  500 mL Intravenous Continuous Armbruster, Renelda Loma, MD        BP 128/60 (BP Location: Right Arm, Patient Position: Sitting, Cuff Size: Normal)   Ht 6' (1.829 m)   Wt 247 lb (112 kg)   BMI 33.50 kg/m       Objective:   Physical Exam  Constitutional: He is oriented to person, place, and time. He appears well-developed and well-nourished. No distress.  HENT:  Bilateral TM's visualized. No signs of infection.  No cerumen impaction   Neurological: He is alert and oriented to person, place, and time.  Skin: Skin is warm and dry. No rash noted. He is not diaphoretic. No erythema. No pallor.  Psychiatric: He has a normal mood and affect. His behavior is normal. Judgment and thought content normal.  Nursing note and vitals reviewed.     Assessment & Plan:  1. Bilateral hearing loss, unspecified hearing loss type - Appears to be a hearing aid malfunction rather than cerumen impaction   Dorothyann Peng, NP

## 2016-11-23 ENCOUNTER — Other Ambulatory Visit: Payer: Self-pay | Admitting: Adult Health

## 2016-11-23 DIAGNOSIS — I10 Essential (primary) hypertension: Secondary | ICD-10-CM

## 2017-01-31 ENCOUNTER — Encounter: Payer: Self-pay | Admitting: Adult Health

## 2017-01-31 ENCOUNTER — Ambulatory Visit (INDEPENDENT_AMBULATORY_CARE_PROVIDER_SITE_OTHER): Payer: Medicare HMO | Admitting: Adult Health

## 2017-01-31 VITALS — BP 144/84 | Temp 97.6°F | Ht 78.0 in | Wt 243.0 lb

## 2017-01-31 DIAGNOSIS — Z23 Encounter for immunization: Secondary | ICD-10-CM

## 2017-01-31 DIAGNOSIS — N400 Enlarged prostate without lower urinary tract symptoms: Secondary | ICD-10-CM | POA: Diagnosis not present

## 2017-01-31 DIAGNOSIS — Z1159 Encounter for screening for other viral diseases: Secondary | ICD-10-CM | POA: Diagnosis not present

## 2017-01-31 DIAGNOSIS — I10 Essential (primary) hypertension: Secondary | ICD-10-CM

## 2017-01-31 DIAGNOSIS — R0989 Other specified symptoms and signs involving the circulatory and respiratory systems: Secondary | ICD-10-CM

## 2017-01-31 DIAGNOSIS — Z0000 Encounter for general adult medical examination without abnormal findings: Secondary | ICD-10-CM | POA: Diagnosis not present

## 2017-01-31 LAB — LIPID PANEL
CHOL/HDL RATIO: 4
CHOLESTEROL: 160 mg/dL (ref 0–200)
HDL: 45.6 mg/dL (ref >39.00)
LDL CALC: 102 mg/dL — AB (ref 0–99)
NonHDL: 114.37
TRIGLYCERIDES: 63 mg/dL (ref 0.0–149.0)
VLDL: 12.6 mg/dL (ref 0.0–40.0)

## 2017-01-31 LAB — CBC WITH DIFFERENTIAL/PLATELET
BASOS PCT: 1 % (ref 0.0–3.0)
Basophils Absolute: 0 10*3/uL (ref 0.0–0.1)
EOS ABS: 0.1 10*3/uL (ref 0.0–0.7)
EOS PCT: 1.5 % (ref 0.0–5.0)
HEMATOCRIT: 44.1 % (ref 39.0–52.0)
Hemoglobin: 14.8 g/dL (ref 13.0–17.0)
LYMPHS PCT: 22 % (ref 12.0–46.0)
Lymphs Abs: 1.1 10*3/uL (ref 0.7–4.0)
MCHC: 33.6 g/dL (ref 30.0–36.0)
MCV: 89 fl (ref 78.0–100.0)
MONOS PCT: 7 % (ref 3.0–12.0)
Monocytes Absolute: 0.4 10*3/uL (ref 0.1–1.0)
NEUTROS ABS: 3.4 10*3/uL (ref 1.4–7.7)
Neutrophils Relative %: 68.5 % (ref 43.0–77.0)
Platelets: 143 10*3/uL — ABNORMAL LOW (ref 150.0–400.0)
RBC: 4.96 Mil/uL (ref 4.22–5.81)
RDW: 14.2 % (ref 11.5–15.5)
WBC: 5 10*3/uL (ref 4.0–10.5)

## 2017-01-31 LAB — HEPATIC FUNCTION PANEL
ALBUMIN: 3.8 g/dL (ref 3.5–5.2)
ALT: 10 U/L (ref 0–53)
AST: 16 U/L (ref 0–37)
Alkaline Phosphatase: 48 U/L (ref 39–117)
Bilirubin, Direct: 0.2 mg/dL (ref 0.0–0.3)
TOTAL PROTEIN: 6.4 g/dL (ref 6.0–8.3)
Total Bilirubin: 1 mg/dL (ref 0.2–1.2)

## 2017-01-31 LAB — BASIC METABOLIC PANEL
BUN: 23 mg/dL (ref 6–23)
CHLORIDE: 106 meq/L (ref 96–112)
CO2: 31 meq/L (ref 19–32)
CREATININE: 1.06 mg/dL (ref 0.40–1.50)
Calcium: 8.9 mg/dL (ref 8.4–10.5)
GFR: 72.65 mL/min (ref >60.00)
Glucose, Bld: 93 mg/dL (ref 70–99)
Potassium: 3.6 mEq/L (ref 3.5–5.1)
Sodium: 143 mEq/L (ref 135–145)

## 2017-01-31 LAB — TSH: TSH: 1.24 u[IU]/mL (ref 0.35–4.50)

## 2017-01-31 LAB — PSA: PSA: 5.63 ng/mL — ABNORMAL HIGH (ref 0.10–4.00)

## 2017-01-31 LAB — HEMOGLOBIN A1C: Hgb A1c MFr Bld: 5.8 % (ref 4.6–6.5)

## 2017-01-31 MED ORDER — TAMSULOSIN HCL 0.4 MG PO CAPS: 0.4000 mg | ORAL_CAPSULE | Freq: Every day | ORAL | 1 refills | Status: DC

## 2017-01-31 NOTE — Progress Notes (Signed)
Subjective:    Patient ID: Brandon [name], male    DOB: 03-23-1946, 71 y.o.   MRN: [id number]  HPI  Patient presents for yearly preventative medicine examination. He is a pleasant 71 year old male who  has a past medical history of Allergy; GERD (gastroesophageal reflux disease); History of kidney cancer; and Hypertension.   He takes Lisinopril-HCTZ 20-25 mg for hypertension. BP today is BP: (!) 144/84 - He did not take his medication this morning.    He takes Cardura 1 mg for BPH with LUTS. He reports that he tried this medication for three months and did not notice any improvement. He continues to have nocturia and will have the urge to urinate hourly. He stream is poor. Denies any pain   All immunizations and health maintenance protocols were reviewed with the patient and needed orders were placed. Flu shot will be given today   Appropriate screening laboratory values were ordered for the patient including screening of hyperlipidemia, renal function and hepatic function.  Medication reconciliation,  past medical history, social history, problem list and allergies were reviewed in detail with the patient  Goals were established with regard to weight loss, exercise, and  diet in compliance with medications. He tries to eat healthy and exercises multiple times per week as well as does yard work.   End of life planning was discussed. He has an advanced directive and living will   He had a colonoscopy in 2017, he is to return in 2020 due to polyps. He does not participate in routine dental or vision care.   Review of Systems  Constitutional: Negative.   HENT: Positive for hearing loss (chronic ).   Eyes: Negative.   Respiratory: Negative.   Cardiovascular: Negative.   Gastrointestinal: Negative.   Endocrine: Negative.   Genitourinary: Negative.   Musculoskeletal: Negative.   Skin: Negative.   Allergic/Immunologic: Negative.   Neurological: Negative.   Hematological: Negative.     Psychiatric/Behavioral: Negative.    Past Medical History:  Diagnosis Date  . Allergy   . GERD (gastroesophageal reflux disease)   . History of kidney cancer    benign; told not cancerous  . Hypertension     Social History   Social History  . Marital status: Married    Spouse name: N/A  . Number of children: N/A  . Years of education: N/A   Occupational History  . Not on file.   Social History Main Topics  . Smoking status: Former Research scientist (life sciences)  . Smokeless tobacco: Never Used  . Alcohol use 0.0 oz/week     Comment: "Beer every once in a while"  . Drug use: No  . Sexual activity: Not on file   Other Topics Concern  . Not on file   Social History Narrative   Retired from [employer]   Married    Three children (Two in [place] and one in [place])     Past Surgical History:  Procedure Laterality Date  . CATARACT EXTRACTION Bilateral   . HERNIA REPAIR     x2  . KIDNEY SURGERY Right    Had right kidney removed.   . TONSILLECTOMY    . VASECTOMY      Family History  Problem Relation Age of Onset  . Cancer Mother        Breast   . Cancer Father        Lung   . Colon cancer Neg Hx     No Known  Allergies  Current Outpatient Prescriptions on File Prior to Visit  Medication Sig Dispense Refill  . Cyanocobalamin (VITAMIN B-12 PO)     . lisinopril-hydrochlorothiazide (PRINZIDE,ZESTORETIC) 20-25 MG tablet TAKE 1 TABLET EVERY DAY 90 tablet 2  . doxazosin (CARDURA) 1 MG tablet Take 1 tablet (1 mg total) by mouth daily. (Patient not taking: Reported on 01/31/2017) 90 tablet 1   No current facility-administered medications on file prior to visit.     BP (!) 144/84 (BP Location: Left Arm)   Temp 97.6 F (36.4 C) (Oral)   Ht 6\' 6"  (1.981 m)   Wt 243 lb (110.2 kg)   BMI 28.08 kg/m       Objective:   Physical Exam  Constitutional: He is oriented to person, place, and time. He appears well-developed and well-nourished. No distress.  HENT:  Head: Normocephalic and  atraumatic.  Right Ear: External ear normal.  Left Ear: External ear normal.  Nose: Nose normal.  Mouth/Throat: Oropharynx is clear and moist. No oropharyngeal exudate.  Eyes: Pupils are equal, round, and reactive to light. Conjunctivae and EOM are normal. Right eye exhibits no discharge. Left eye exhibits no discharge. No scleral icterus.  Neck: Trachea normal and normal range of motion. Neck supple. No JVD present. Carotid bruit is not present. No tracheal deviation present. No thyroid mass and no thyromegaly present.  Cardiovascular: Normal rate, regular rhythm and intact distal pulses.  Exam reveals no gallop and no friction rub.   Murmur heard.  Systolic murmur is present  Pulmonary/Chest: Effort normal and breath sounds normal. No stridor. No respiratory distress. He has no wheezes. He has no rales. He exhibits no tenderness.  Abdominal: Soft. Bowel sounds are normal. He exhibits abdominal bruit. He exhibits no distension and no mass. There is no tenderness. There is no rebound and no guarding.  Musculoskeletal: Normal range of motion. He exhibits no edema, tenderness or deformity.  Lymphadenopathy:    He has no cervical adenopathy.  Neurological: He is alert and oriented to person, place, and time. No cranial nerve deficit. Coordination normal.  Skin: Skin is warm and dry. No rash noted. He is not diaphoretic. No erythema. No pallor.  Scattered cheery angiomas and AK's   Psychiatric: He has a normal mood and affect. His behavior is normal. Judgment and thought content normal.  Nursing note and vitals reviewed.     Assessment & Plan:  1. Routine general medical examination at a health care facility - Continue to exercise and eat healthy  - Follow up in one year or sooner if needed - Basic metabolic panel - CBC with Differential/Platelet - Hepatic function panel - Lipid panel - TSH - Hemoglobin A1c - PSA  2. Essential hypertension - BP stable when taking medication - Basic  metabolic panel - CBC with Differential/Platelet - Hepatic function panel - Lipid panel - TSH - Hemoglobin A1c  3. Benign prostatic hyperplasia without lower urinary tract symptoms - D/c Cardura and try flomax  - PSA - tamsulosin (FLOMAX) 0.4 MG CAPS capsule; Take 1 capsule (0.4 mg total) by mouth daily.  Dispense: 90 capsule; Refill: 1  4. Abdominal bruit  - CT ANGIO CHEST AORTA W/CM &/OR WO/CM; Future - Consider referral to cardio thoracic  5. Need for hepatitis C screening test  - Hep C Antibody  6. Need for prophylactic vaccination and inoculation against influenza  - Flu vaccine HIGH DOSE PF (Fluzone High dose)  Dorothyann Peng, NP

## 2017-01-31 NOTE — Patient Instructions (Signed)
It was great seeing you today   I will follow up with you regarding your blood work   Someone will call you to schedule your CT of the abdomen   Health Maintenance, Male A healthy lifestyle and preventative care can promote health and wellness.  Maintain regular health, dental, and eye exams.  Eat a healthy diet. Foods like vegetables, fruits, whole grains, low-fat dairy products, and lean protein foods contain the nutrients you need and are low in calories. Decrease your intake of foods high in solid fats, added sugars, and salt. Get information about a proper diet from your health care provider, if necessary.  Regular physical exercise is one of the most important things you can do for your health. Most adults should get at least 150 minutes of moderate-intensity exercise (any activity that increases your heart rate and causes you to sweat) each week. In addition, most adults need muscle-strengthening exercises on 2 or more days a week.   Maintain a healthy weight. The body mass index (BMI) is a screening tool to identify possible weight problems. It provides an estimate of body fat based on height and weight. Your health care provider can find your BMI and can help you achieve or maintain a healthy weight. For males 20 years and older:  A BMI below 18.5 is considered underweight.  A BMI of 18.5 to 24.9 is normal.  A BMI of 25 to 29.9 is considered overweight.  A BMI of 30 and above is considered obese.  Maintain normal blood lipids and cholesterol by exercising and minimizing your intake of saturated fat. Eat a balanced diet with plenty of fruits and vegetables. Blood tests for lipids and cholesterol should begin at age 22 and be repeated every 5 years. If your lipid or cholesterol levels are high, you are over age 41, or you are at high risk for heart disease, you may need your cholesterol levels checked more frequently.Ongoing high lipid and cholesterol levels should be treated with  medicines if diet and exercise are not working.  If you smoke, find out from your health care provider how to quit. If you do not use tobacco, do not start.  Lung cancer screening is recommended for adults aged 61-80 years who are at high risk for developing lung cancer because of a history of smoking. A yearly low-dose CT scan of the lungs is recommended for people who have at least a 30-pack-year history of smoking and are current smokers or have quit within the past 15 years. A pack year of smoking is smoking an average of 1 pack of cigarettes a day for 1 year (for example, a 30-pack-year history of smoking could mean smoking 1 pack a day for 30 years or 2 packs a day for 15 years). Yearly screening should continue until the smoker has stopped smoking for at least 15 years. Yearly screening should be stopped for people who develop a health problem that would prevent them from having lung cancer treatment.  If you choose to drink alcohol, do not have more than 2 drinks per day. One drink is considered to be 12 oz (360 mL) of beer, 5 oz (150 mL) of wine, or 1.5 oz (45 mL) of liquor.  Avoid the use of street drugs. Do not share needles with anyone. Ask for help if you need support or instructions about stopping the use of drugs.  High blood pressure causes heart disease and increases the risk of stroke. High blood pressure is more likely  develop in:  People who have blood pressure in the end of the normal range (100-139/85-89 mm Hg).  People who are overweight or obese.  People who are African American.  If you are 18-39 years of age, have your blood pressure checked every 3-5 years. If you are 40 years of age or older, have your blood pressure checked every year. You should have your blood pressure measured twice--once when you are at a hospital or clinic, and once when you are not at a hospital or clinic. Record the average of the two measurements. To check your blood pressure when you are not  at a hospital or clinic, you can use:  An automated blood pressure machine at a pharmacy.  A home blood pressure monitor.  If you are 45-79 years old, ask your health care provider if you should take aspirin to prevent heart disease.  Diabetes screening involves taking a blood sample to check your fasting blood sugar level. This should be done once every 3 years after age 45 if you are at a normal weight and without risk factors for diabetes. Testing should be considered at a younger age or be carried out more frequently if you are overweight and have at least 1 risk factor for diabetes.  Colorectal cancer can be detected and often prevented. Most routine colorectal cancer screening begins at the age of 50 and continues through age 75. However, your health care provider may recommend screening at an earlier age if you have risk factors for colon cancer. On a yearly basis, your health care provider may provide home test kits to check for hidden blood in the stool. A small camera at the end of a tube may be used to directly examine the colon (sigmoidoscopy or colonoscopy) to detect the earliest forms of colorectal cancer. Talk to your health care provider about this at age 50 when routine screening begins. A direct exam of the colon should be repeated every 5-10 years through age 75, unless early forms of precancerous polyps or small growths are found.  People who are at an increased risk for hepatitis B should be screened for this virus. You are considered at high risk for hepatitis B if:  You were born in a country where hepatitis B occurs often. Talk with your health care provider about which countries are considered high risk.  Your parents were born in a high-risk country and you have not received a shot to protect against hepatitis B (hepatitis B vaccine).  You have HIV or AIDS.  You use needles to inject street drugs.  You live with, or have sex with, someone who has hepatitis B.  You  are a man who has sex with other men (MSM).  You get hemodialysis treatment.  You take certain medicines for conditions like cancer, organ transplantation, and autoimmune conditions.  Hepatitis C blood testing is recommended for all people born from 1945 through 1965 and any individual with known risk factors for hepatitis C.  Healthy men should no longer receive prostate-specific antigen (PSA) blood tests as part of routine cancer screening. Talk to your health care provider about prostate cancer screening.  Testicular cancer screening is not recommended for adolescents or adult males who have no symptoms. Screening includes self-exam, a health care provider exam, and other screening tests. Consult with your health care provider about any symptoms you have or any concerns you have about testicular cancer.  Practice safe sex. Use condoms and avoid high-risk sexual practices to reduce   the spread of sexually transmitted infections (STIs).  You should be screened for STIs, including gonorrhea and chlamydia if:  You are sexually active and are younger than 24 years.  You are older than 24 years, and your health care provider tells you that you are at risk for this type of infection.  Your sexual activity has changed since you were last screened, and you are at an increased risk for chlamydia or gonorrhea. Ask your health care provider if you are at risk.  If you are at risk of being infected with HIV, it is recommended that you take a prescription medicine daily to prevent HIV infection. This is called pre-exposure prophylaxis (PrEP). You are considered at risk if:  You are a man who has sex with other men (MSM).  You are a heterosexual man who is sexually active with multiple partners.  You take drugs by injection.  You are sexually active with a partner who has HIV.  Talk with your health care provider about whether you are at high risk of being infected with HIV. If you choose to begin  PrEP, you should first be tested for HIV. You should then be tested every 3 months for as long as you are taking PrEP.  Use sunscreen. Apply sunscreen liberally and repeatedly throughout the day. You should seek shade when your shadow is shorter than you. Protect yourself by wearing long sleeves, pants, a wide-brimmed hat, and sunglasses year round whenever you are outdoors.  Tell your health care provider of new moles or changes in moles, especially if there is a change in shape or color. Also, tell your health care provider if a mole is larger than the size of a pencil eraser.  A one-time screening for abdominal aortic aneurysm (AAA) and surgical repair of large AAAs by ultrasound is recommended for men aged 65-75 years who are current or former smokers.  Stay current with your vaccines (immunizations).   This information is not intended to replace advice given to you by your health care provider. Make sure you discuss any questions you have with your health care provider.   Document Released: 10/27/2007 Document Revised: 05/21/2014 Document Reviewed: 09/25/2010 Elsevier Interactive Patient Education 2016 [employer]   

## 2017-02-01 ENCOUNTER — Encounter: Payer: Self-pay | Admitting: Adult Health

## 2017-02-01 ENCOUNTER — Other Ambulatory Visit: Payer: Self-pay

## 2017-02-01 DIAGNOSIS — R0989 Other specified symptoms and signs involving the circulatory and respiratory systems: Secondary | ICD-10-CM

## 2017-02-01 LAB — HEPATITIS C ANTIBODY
HEP C AB: NONREACTIVE
SIGNAL TO CUT-OFF: 0.04 (ref <1.00)

## 2017-02-05 ENCOUNTER — Other Ambulatory Visit: Payer: Self-pay | Admitting: Adult Health

## 2017-02-05 DIAGNOSIS — N400 Enlarged prostate without lower urinary tract symptoms: Secondary | ICD-10-CM

## 2017-02-07 ENCOUNTER — Ambulatory Visit (INDEPENDENT_AMBULATORY_CARE_PROVIDER_SITE_OTHER)
Admission: RE | Admit: 2017-02-07 | Discharge: 2017-02-07 | Disposition: A | Discharge status: Home / Self Care | Payer: Medicare HMO | Source: Ambulatory Visit | Attending: Adult Health | Admitting: Adult Health

## 2017-02-07 ENCOUNTER — Telehealth: Payer: Self-pay | Admitting: Adult Health

## 2017-02-07 DIAGNOSIS — R0989 Other specified symptoms and signs involving the circulatory and respiratory systems: Secondary | ICD-10-CM | POA: Diagnosis not present

## 2017-02-07 IMAGING — CT CT ANGIO ABDOMEN
2 of 8 series · 14 of 46 positions shown, 18 images · IV contrast (isovue)
Comparison: None.

CLINICAL DATA: Abdominal bruit.

EXAM:
CT ANGIOGRAPHY CHEST AND ABDOMEN
TECHNIQUE: Multidetector CT imaging of the chest and abdomen was performed
using the standard protocol during bolus administration of
intravenous contrast. Multiplanar CT image reconstructions and MIPs
were obtained to evaluate the vascular anatomy.
CONTRAST:  80 mL of Isovue 370 intravenously.

[Series 4: aorta 3.0 i31f 2 · axial · 0.82mm/px · z∈[+931,+1336]mm · 11 of 155 slices shown, 15 images]
[im 10/155  soft-tissue]
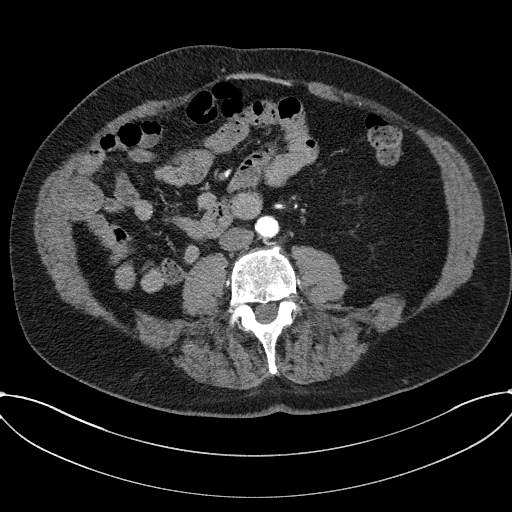
[im 10/155  bone]
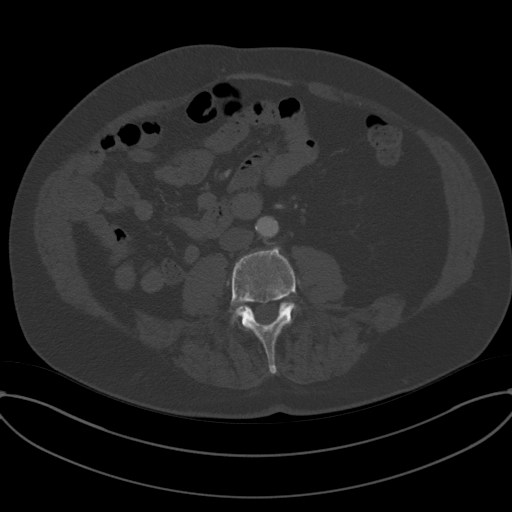
[im 28/155  soft-tissue]
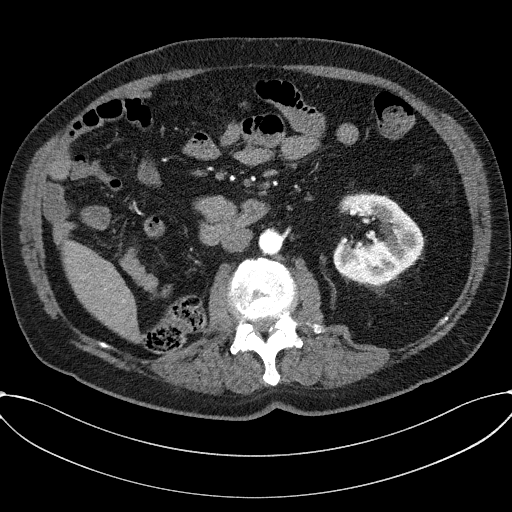
[im 46/155  soft-tissue]
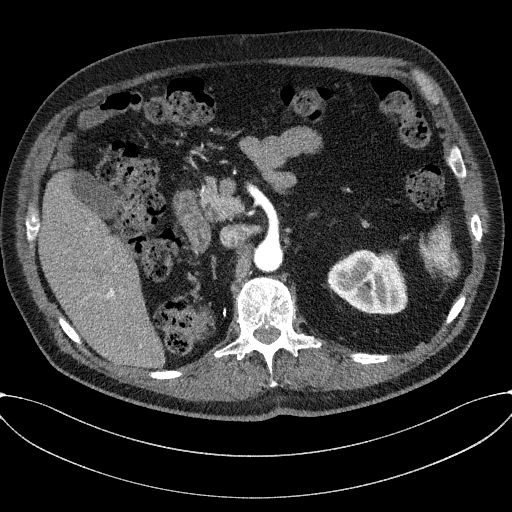
[im 64/155  soft-tissue]
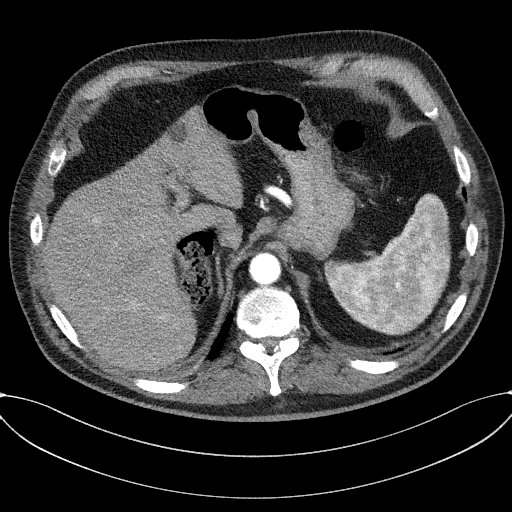
[im 82/155  soft-tissue]
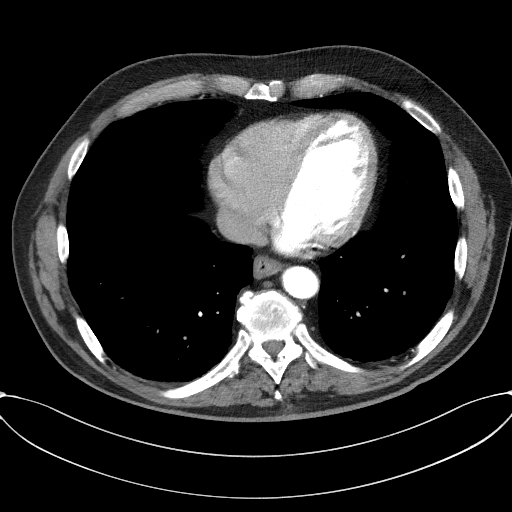
[im 91/155  soft-tissue]
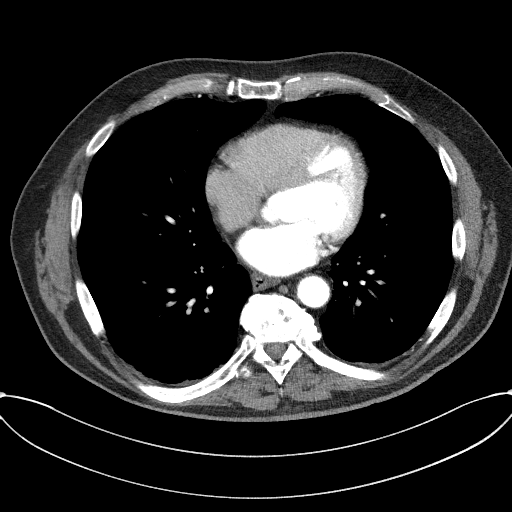
[im 109/155  soft-tissue]
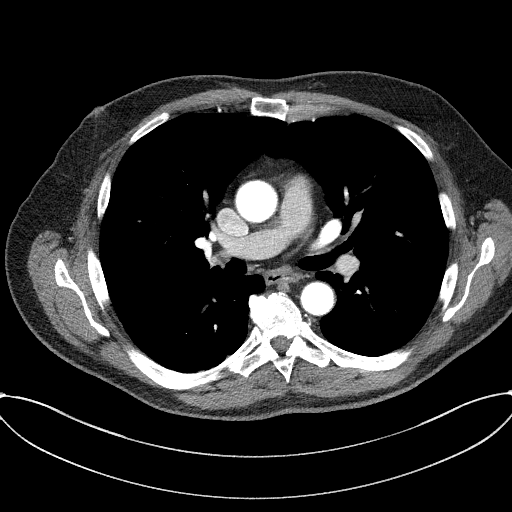
[im 118/155  lung]
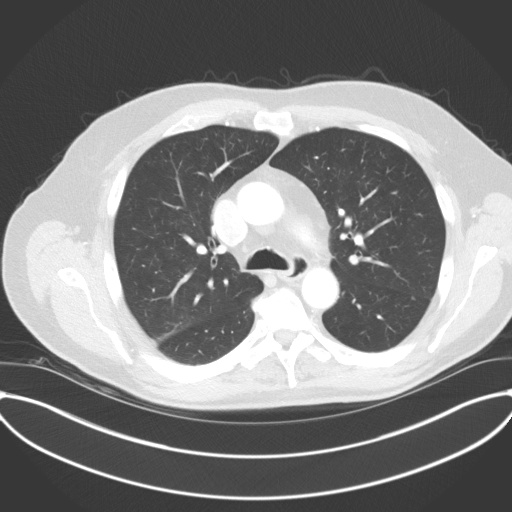
[im 127/155  soft-tissue]
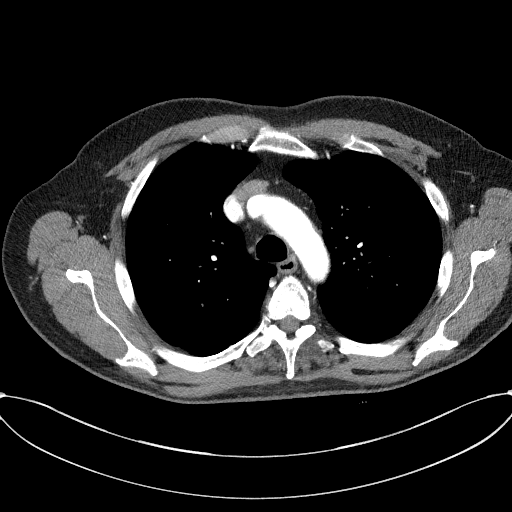
[im 127/155  lung]
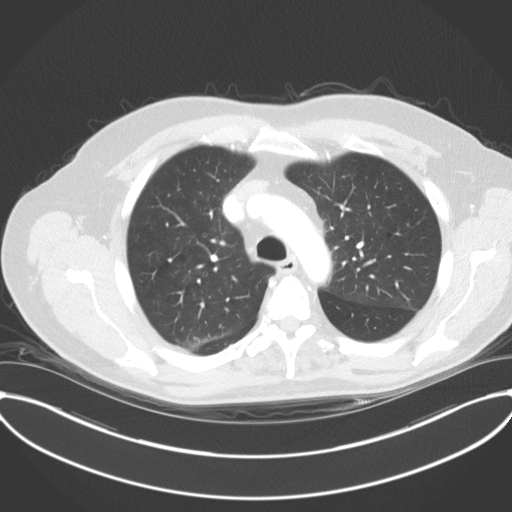
[im 136/155  lung]
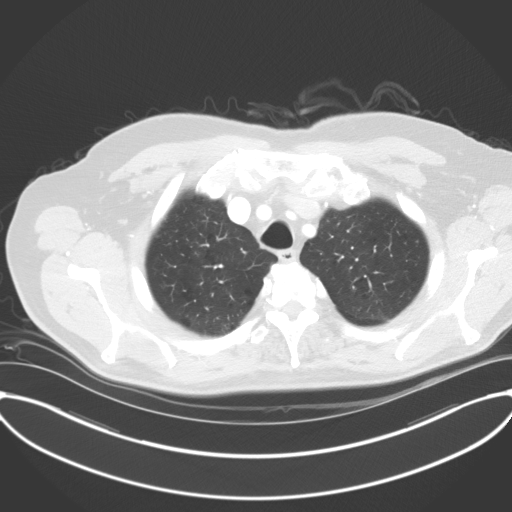
[im 145/155  soft-tissue]
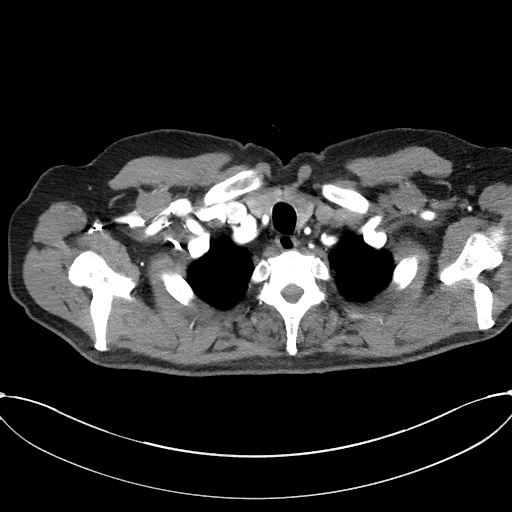
[im 145/155  lung]
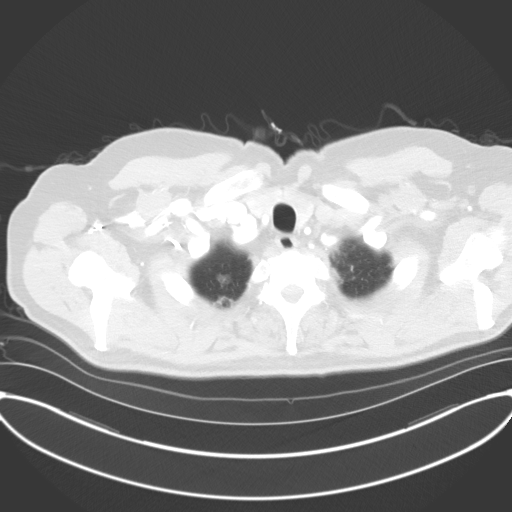
[im 145/155  bone]
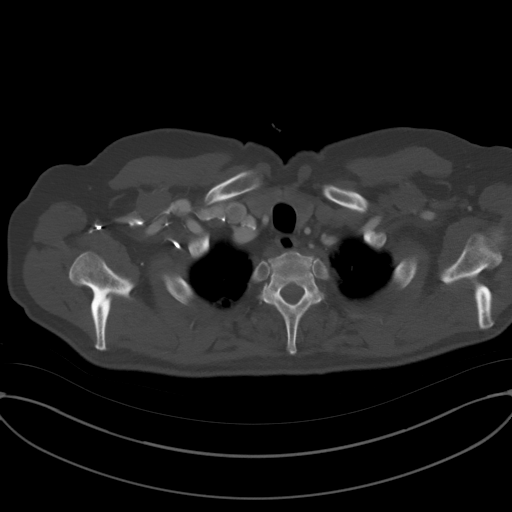

[Series 8: coronals · coronal · 0.86mm/px · 3 of 148 slices shown]
[im 37/148  soft-tissue]
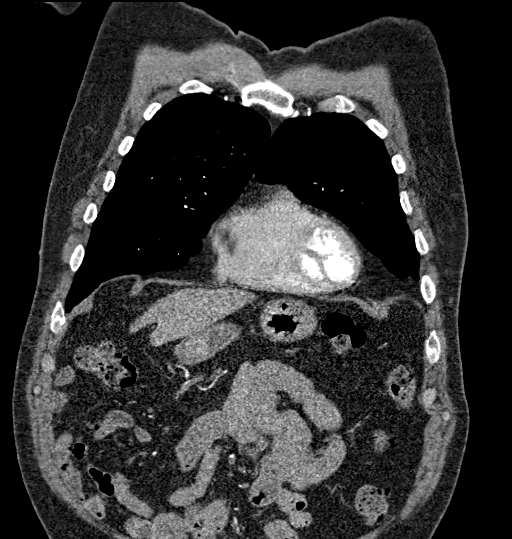
[im 74/148  soft-tissue]
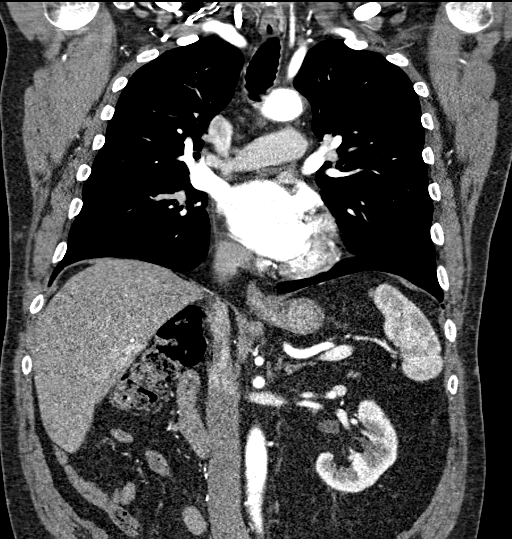
[im 111/148  soft-tissue]
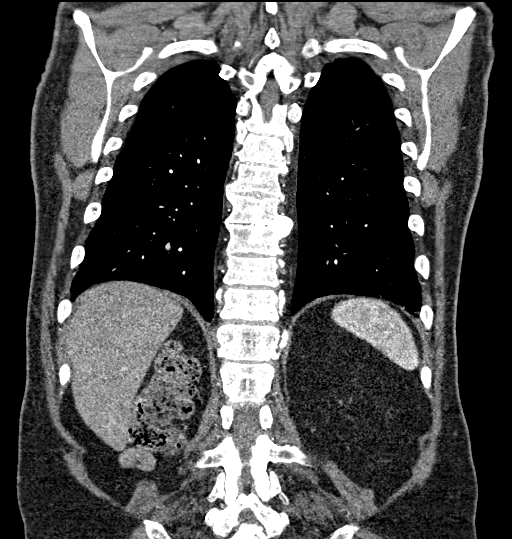

[14 of 46 positions shown; findings below may reference images not displayed]

FINDINGS: CTA CHEST FINDINGS

Cardiovascular: Satisfactory opacification of the pulmonary arteries
to the segmental level. No evidence of pulmonary embolism. Normal
heart size. No pericardial effusion.

Mediastinum/Nodes: No enlarged mediastinal, hilar, or axillary lymph
nodes. Thyroid gland, trachea, and esophagus demonstrate no
significant findings.

Lungs/Pleura: Lungs are clear. No pleural effusion or pneumothorax.

Musculoskeletal: No chest wall abnormality. No acute or significant
osseous findings.

Review of the MIP images confirms the above findings.

CTA ABDOMEN FINDINGS

Aorta: Normal caliber aorta without aneurysm, dissection, vasculitis
or significant stenosis. Atherosclerosis of infrarenal abdominal
aorta is noted.

Celiac: Patent without evidence of aneurysm, dissection, vasculitis
or significant stenosis.

SMA: Patent without evidence of aneurysm, dissection, vasculitis or
significant stenosis.

Renals: Status post right nephrectomy. Two left renal arteries are
noted without significant stenosis.

IMA: Patent without evidence of aneurysm, dissection, vasculitis or
significant stenosis.

Inflow: Patent without evidence of aneurysm, dissection, vasculitis
or significant stenosis.

Veins: No obvious venous abnormality within the limitations of this
arterial phase study.

Hepatobiliary: No gallstones are noted.  Hepatic cysts are noted.

Pancreas: No abnormality seen.

Spleen: No abnormality seen.

Adrenals/Urinary Tract: Adrenal glands appear normal. Status post
right nephrectomy. Left kidney and visualize ureter appear normal.
No hydronephrosis or renal obstruction is noted.

Stomach/Bowel: Stomach and visualized bowel appear normal.

Lymphatic: No significant adenopathy is noted.

Musculoskeletal: Multilevel degenerative disc disease is noted in
the visualized lumbar spine. No acute abnormality is noted.

Other: No abnormal fluid collection or hernia is noted.

Review of the MIP images confirms the above findings.
IMPRESSION: No evidence of thoracic or abdominal aortic aneurysm or dissection.

Atherosclerosis of abdominal aorta is noted.

Status post right nephrectomy.

Multilevel degenerative disc disease is noted in the lumbar spine.

Great vessels in the chest are widely patent, as well as visualized
mesenteric and renal arteries in the abdomen.

## 2017-02-07 MED ORDER — ATORVASTATIN CALCIUM 20 MG PO TABS
20.0000 mg | ORAL_TABLET | Freq: Every day | ORAL | 3 refills | Status: DC
Start: 2017-02-07 — End: 2018-02-03

## 2017-02-07 MED ORDER — IOPAMIDOL (ISOVUE-370) INJECTION 76%
80.0000 mL | Freq: Once | INTRAVENOUS | Status: AC | PRN
  Administered 2017-02-07: 80 mL via INTRAVENOUS

## 2017-02-07 NOTE — Telephone Encounter (Signed)
Spoke to patient and informed him of his CT results. He does not have a aneurysm. He did have some aorta atherosclerosis . He is ok with starting Lipitor

## 2017-03-07 ENCOUNTER — Ambulatory Visit (INDEPENDENT_AMBULATORY_CARE_PROVIDER_SITE_OTHER): Payer: Medicare HMO | Admitting: Adult Health

## 2017-03-07 ENCOUNTER — Other Ambulatory Visit: Payer: Medicare HMO

## 2017-03-07 ENCOUNTER — Encounter: Payer: Self-pay | Admitting: Adult Health

## 2017-03-07 VITALS — BP 118/80 | Temp 98.2°F | Ht 78.0 in | Wt 249.0 lb

## 2017-03-07 DIAGNOSIS — N400 Enlarged prostate without lower urinary tract symptoms: Secondary | ICD-10-CM | POA: Diagnosis not present

## 2017-03-07 DIAGNOSIS — D492 Neoplasm of unspecified behavior of bone, soft tissue, and skin: Secondary | ICD-10-CM | POA: Diagnosis not present

## 2017-03-07 DIAGNOSIS — C44612 Basal cell carcinoma of skin of right upper limb, including shoulder: Secondary | ICD-10-CM | POA: Diagnosis not present

## 2017-03-07 LAB — PSA: PSA: 6.38 ng/mL — AB (ref 0.10–4.00)

## 2017-03-07 NOTE — Progress Notes (Signed)
Subjective:    Patient ID: Brandon [name], male    DOB: Jan 30, 1946, 71 y.o.   MRN: [id number]  HPI  71 year old male who  has a past medical history of Allergy; GERD (gastroesophageal reflux disease); History of kidney cancer; and Hypertension.  He presents to the office today for removal of skin neoplasm on right hand. Reports that this has been present for " a long time" but has just started to become painful.   Denies any drainage from area.   Review of Systems See HPI   Past Medical History:  Diagnosis Date  . Allergy   . GERD (gastroesophageal reflux disease)   . History of kidney cancer    benign; told not cancerous  . Hypertension     Social History   Social History  . Marital status: Married    Spouse name: N/A  . Number of children: N/A  . Years of education: N/A   Occupational History  . Not on file.   Social History Main Topics  . Smoking status: Former Research scientist (life sciences)  . Smokeless tobacco: Never Used  . Alcohol use 0.0 oz/week     Comment: "Beer every once in a while"  . Drug use: No  . Sexual activity: Not on file   Other Topics Concern  . Not on file   Social History Narrative   Retired from [employer]   Married    Three children (Two in [place] and one in [place])     Past Surgical History:  Procedure Laterality Date  . CATARACT EXTRACTION Bilateral   . HERNIA REPAIR     x2  . KIDNEY SURGERY Right    Had right kidney removed.   . TONSILLECTOMY    . VASECTOMY      Family History  Problem Relation Age of Onset  . Cancer Mother        Breast   . Cancer Father        Lung   . Colon cancer Neg Hx     No Known Allergies  Current Outpatient Prescriptions on File Prior to Visit  Medication Sig Dispense Refill  . atorvastatin (LIPITOR) 20 MG tablet Take 1 tablet (20 mg total) by mouth daily. 90 tablet 3  . cholecalciferol (VITAMIN D) 1000 units tablet Take 1,000 Units by mouth daily.    . Cyanocobalamin (VITAMIN B-12 PO)     .  lisinopril-hydrochlorothiazide (PRINZIDE,ZESTORETIC) 20-25 MG tablet TAKE 1 TABLET EVERY DAY 90 tablet 2  . tamsulosin (FLOMAX) 0.4 MG CAPS capsule Take 1 capsule (0.4 mg total) by mouth daily. 90 capsule 1  . vitamin C (ASCORBIC ACID) 500 MG tablet      No current facility-administered medications on file prior to visit.     BP 118/80 (BP Location: Left Arm)   Temp 98.2 F (36.8 C) (Oral)   Ht 6\' 6"  (1.981 m)   Wt 249 lb (112.9 kg)   BMI 28.77 kg/m       Objective:   Physical Exam  Constitutional: He is oriented to person, place, and time. He appears well-developed and well-nourished. No distress.  Neurological: He is alert and oriented to person, place, and time.  Skin: Skin is warm and dry. No rash noted. He is not diaphoretic. No erythema. No pallor.  Approx 4 mm Irritated neoplasm of uncertain origin on right hand in webbing between thumb and pointer finger.   Psychiatric: He has a normal mood and affect. His behavior is  normal. Judgment and thought content normal.  Nursing note and vitals reviewed.     Assessment & Plan:  1. Skin neoplasm Procedure including risks/benefits explained to patient.  Questions were answered. After informed consent was obtained and a time out completed, site was cleansed with betadine and then alcohol. 1% Lidocaine with epinephrine was injected under lesion and then shave biopsy was performed. Area was cauterized to obtain hemostasis.  Pt tolerated procedure well.  Specimen sent for pathology review.  Pt instructed to keep the area dry for 24 hours and to contact us if he develops redness, drainage or swelling at the site.  Pt may use tylenol as needed for discomfort today.  - Dermatology pathology  2. Benign prostatic hyperplasia without lower urinary tract symptoms  - PSA   Dorothyann Peng, NP

## 2017-03-15 ENCOUNTER — Other Ambulatory Visit: Payer: Self-pay | Admitting: Adult Health

## 2017-03-15 DIAGNOSIS — C4491 Basal cell carcinoma of skin, unspecified: Secondary | ICD-10-CM

## 2017-03-15 DIAGNOSIS — R972 Elevated prostate specific antigen [PSA]: Secondary | ICD-10-CM

## 2017-03-15 DIAGNOSIS — N400 Enlarged prostate without lower urinary tract symptoms: Secondary | ICD-10-CM

## 2017-03-26 DIAGNOSIS — C4491 Basal cell carcinoma of skin, unspecified: Secondary | ICD-10-CM | POA: Diagnosis not present

## 2017-04-18 ENCOUNTER — Encounter: Payer: Self-pay | Admitting: Adult Health

## 2017-04-18 DIAGNOSIS — N400 Enlarged prostate without lower urinary tract symptoms: Secondary | ICD-10-CM

## 2017-04-18 MED ORDER — TAMSULOSIN HCL 0.4 MG PO CAPS: 0.4000 mg | ORAL_CAPSULE | Freq: Every day | ORAL | 2 refills | Status: DC

## 2017-05-01 DIAGNOSIS — S90229A Contusion of unspecified lesser toe(s) with damage to nail, initial encounter: Secondary | ICD-10-CM | POA: Diagnosis not present

## 2017-05-01 DIAGNOSIS — L219 Seborrheic dermatitis, unspecified: Secondary | ICD-10-CM | POA: Diagnosis not present

## 2017-05-01 DIAGNOSIS — Z85828 Personal history of other malignant neoplasm of skin: Secondary | ICD-10-CM | POA: Diagnosis not present

## 2017-05-01 DIAGNOSIS — L821 Other seborrheic keratosis: Secondary | ICD-10-CM | POA: Diagnosis not present

## 2017-05-02 ENCOUNTER — Ambulatory Visit: Payer: Medicare HMO | Admitting: Adult Health

## 2017-05-03 ENCOUNTER — Encounter: Payer: Self-pay | Admitting: Family Medicine

## 2017-05-03 ENCOUNTER — Ambulatory Visit: Payer: Medicare HMO | Admitting: Family Medicine

## 2017-05-03 VITALS — BP 110/80 | HR 71 | Temp 98.3°F | Wt 248.8 lb

## 2017-05-03 DIAGNOSIS — H6123 Impacted cerumen, bilateral: Secondary | ICD-10-CM

## 2017-05-03 NOTE — Progress Notes (Signed)
Subjective:    Patient ID: Brandon [name], male    DOB: Oct 08, 1945, 71 y.o.   MRN: [id number]  No chief complaint on file.   HPI Pt was seen today for acute concern.  Pt endorses history of frequent cerumen impaction.  Pt wears hearing aids and often has the wax removed prior to fitting of new hearing aids.  Pt denies ear pain.  Past Medical History:  Diagnosis Date  . Allergy   . GERD (gastroesophageal reflux disease)   . History of kidney cancer    benign; told not cancerous  . Hypertension     No Known Allergies  ROS General: Denies fever, chills, night sweats, changes in weight, changes in appetite HEENT: Denies headaches, ear pain, changes in vision, rhinorrhea, sore throat   +cerumen in both ears, hearing loss CV: Denies CP, palpitations, SOB, orthopnea Pulm: Denies SOB, cough, wheezing GI: Denies abdominal pain, nausea, vomiting, diarrhea, constipation GU: Denies dysuria, hematuria, frequency, vaginal discharge Msk: Denies muscle cramps, joint pains Neuro: Denies weakness, numbness, tingling Skin: Denies rashes, bruising Psych: Denies depression, anxiety, hallucinations     Objective:    Blood pressure 110/80, pulse 71, temperature 98.3 F (36.8 C), temperature source Oral, weight 248 lb 12.8 oz (112.9 kg).   Gen. Pleasant, well-nourished, in no distress, normal affect  HEENT: Hearing aids in place b/l.  [place]/AT, face symmetric, no scleral icterus, PERRLA, nares patent without drainage, b/l ear canals with moderate cerumen, TMs nml.  Canals clear after irrigation. Lungs: no accessory muscle use, CTAB, no wheezes or rales Cardiovascular: RRR, no m/r/g, no peripheral edema Neuro:  A&Ox3, CN II-XII intact, normal gait    Wt Readings from Last 3 Encounters:  05/03/17 248 lb 12.8 oz (112.9 kg)  03/07/17 249 lb (112.9 kg)  01/31/17 243 lb (110.2 kg)      Assessment/Plan:  Bilateral impacted cerumen  -Bilateral ear irrigation -Patient encouraged to obtain  Debrox eardrops for home use.  Follow-up PRN  Grier Mitts, MD

## 2017-05-03 NOTE — Patient Instructions (Addendum)
Earwax Buildup, Adult The ears produce a substance called earwax that helps keep bacteria out of the ear and protects the skin in the ear canal. Occasionally, earwax can build up in the ear and cause discomfort or hearing loss. What increases the risk? This condition is more likely to develop in people who:  Are male.  Are elderly.  Naturally produce more earwax.  Clean their ears often with cotton swabs.  Use earplugs often.  Use in-ear headphones often.  Wear hearing aids.  Have narrow ear canals.  Have earwax that is overly thick or sticky.  Have eczema.  Are dehydrated.  Have excess hair in the ear canal.  What are the signs or symptoms? Symptoms of this condition include:  Reduced or muffled hearing.  A feeling of fullness in the ear or feeling that the ear is plugged.  Fluid coming from the ear.  Ear pain.  Ear itch.  Ringing in the ear.  Coughing.  An obvious piece of earwax that can be seen inside the ear canal.  How is this diagnosed? This condition may be diagnosed based on:  Your symptoms.  Your medical history.  An ear exam. During the exam, your health care provider will look into your ear with an instrument called an otoscope.  You may have tests, including a hearing test. How is this treated? This condition may be treated by:  Using ear drops to soften the earwax.  Having the earwax removed by a health care provider. The health care provider may: ? Flush the ear with water. ? Use an instrument that has a loop on the end (curette). ? Use a suction device.  Surgery to remove the wax buildup. This may be done in severe cases.  Follow these instructions at home:  Take over-the-counter and prescription medicines only as told by your health care provider.  Do not put any objects, including cotton swabs, into your ear. You can clean the opening of your ear canal with a washcloth or facial tissue.  Follow instructions from your health  care provider about cleaning your ears. Do not over-clean your ears.  Drink enough fluid to keep your urine clear or pale yellow. This will help to thin the earwax.  Keep all follow-up visits as told by your health care provider. If earwax builds up in your ears often or if you use hearing aids, consider seeing your health care provider for routine, preventive ear cleanings. Ask your health care provider how often you should schedule your cleanings.  If you have hearing aids, clean them according to instructions from the manufacturer and your health care provider. Contact a health care provider if:  You have ear pain.  You develop a fever.  You have blood, pus, or other fluid coming from your ear.  You have hearing loss.  You have ringing in your ears that does not go away.  Your symptoms do not improve with treatment.  You feel like the room is spinning (vertigo). Summary  Earwax can build up in the ear and cause discomfort or hearing loss.  The most common symptoms of this condition include reduced or muffled hearing and a feeling of fullness in the ear or feeling that the ear is plugged.  This condition may be diagnosed based on your symptoms, your medical history, and an ear exam.  This condition may be treated by using ear drops to soften the earwax or by having the earwax removed by a health care provider.  Do   not put any objects, including cotton swabs, into your ear. You can clean the opening of your ear canal with a washcloth or facial tissue. This information is not intended to replace advice given to you by your health care provider. Make sure you discuss any questions you have with your health care provider. Document Released: 06/07/2004 Document Revised: 07/11/2016 Document Reviewed: 07/11/2016 Elsevier Interactive Patient Education  2018 [employer] Inc[employer]  

## 2017-05-16 DIAGNOSIS — R972 Elevated prostate specific antigen [PSA]: Secondary | ICD-10-CM | POA: Diagnosis not present

## 2017-05-16 DIAGNOSIS — Z905 Acquired absence of kidney: Secondary | ICD-10-CM | POA: Diagnosis not present

## 2017-05-16 DIAGNOSIS — R3915 Urgency of urination: Secondary | ICD-10-CM | POA: Diagnosis not present

## 2017-07-17 ENCOUNTER — Encounter: Payer: Self-pay | Admitting: Adult Health

## 2017-08-06 DIAGNOSIS — R972 Elevated prostate specific antigen [PSA]: Secondary | ICD-10-CM | POA: Diagnosis not present

## 2017-08-06 LAB — PSA: PSA: 5.91

## 2017-08-15 DIAGNOSIS — R972 Elevated prostate specific antigen [PSA]: Secondary | ICD-10-CM | POA: Diagnosis not present

## 2017-08-15 DIAGNOSIS — Z905 Acquired absence of kidney: Secondary | ICD-10-CM | POA: Diagnosis not present

## 2017-08-15 DIAGNOSIS — R3915 Urgency of urination: Secondary | ICD-10-CM | POA: Diagnosis not present

## 2017-09-05 ENCOUNTER — Encounter: Payer: Self-pay | Admitting: Family Medicine

## 2017-09-17 DIAGNOSIS — R972 Elevated prostate specific antigen [PSA]: Secondary | ICD-10-CM | POA: Diagnosis not present

## 2017-09-17 DIAGNOSIS — C61 Malignant neoplasm of prostate: Secondary | ICD-10-CM | POA: Diagnosis not present

## 2017-09-26 DIAGNOSIS — R3915 Urgency of urination: Secondary | ICD-10-CM | POA: Diagnosis not present

## 2017-09-26 DIAGNOSIS — C61 Malignant neoplasm of prostate: Secondary | ICD-10-CM | POA: Diagnosis not present

## 2017-09-26 DIAGNOSIS — N5201 Erectile dysfunction due to arterial insufficiency: Secondary | ICD-10-CM | POA: Diagnosis not present

## 2017-09-26 DIAGNOSIS — Z905 Acquired absence of kidney: Secondary | ICD-10-CM | POA: Diagnosis not present

## 2017-09-27 ENCOUNTER — Encounter: Payer: Self-pay | Admitting: Family Medicine

## 2017-12-02 ENCOUNTER — Encounter: Payer: Self-pay | Admitting: Adult Health

## 2017-12-02 ENCOUNTER — Other Ambulatory Visit: Payer: Self-pay

## 2017-12-02 DIAGNOSIS — I10 Essential (primary) hypertension: Secondary | ICD-10-CM

## 2017-12-02 MED ORDER — LISINOPRIL-HYDROCHLOROTHIAZIDE 20-25 MG PO TABS: 1.0000 | ORAL_TABLET | Freq: Every day | ORAL | 0 refills | Status: DC

## 2018-02-03 ENCOUNTER — Other Ambulatory Visit: Payer: Self-pay | Admitting: Adult Health

## 2018-02-03 DIAGNOSIS — I10 Essential (primary) hypertension: Secondary | ICD-10-CM

## 2018-02-13 ENCOUNTER — Encounter: Payer: Self-pay | Admitting: Adult Health

## 2018-02-13 ENCOUNTER — Ambulatory Visit (INDEPENDENT_AMBULATORY_CARE_PROVIDER_SITE_OTHER): Payer: Medicare HMO | Admitting: Adult Health

## 2018-02-13 VITALS — BP 126/74 | HR 68 | Temp 98.4°F | Ht >= 80 in | Wt 249.6 lb

## 2018-02-13 DIAGNOSIS — Z0000 Encounter for general adult medical examination without abnormal findings: Secondary | ICD-10-CM | POA: Diagnosis not present

## 2018-02-13 DIAGNOSIS — Z23 Encounter for immunization: Secondary | ICD-10-CM | POA: Diagnosis not present

## 2018-02-13 DIAGNOSIS — N400 Enlarged prostate without lower urinary tract symptoms: Secondary | ICD-10-CM | POA: Diagnosis not present

## 2018-02-13 DIAGNOSIS — E785 Hyperlipidemia, unspecified: Secondary | ICD-10-CM | POA: Insufficient documentation

## 2018-02-13 DIAGNOSIS — I10 Essential (primary) hypertension: Secondary | ICD-10-CM

## 2018-02-13 LAB — HEPATIC FUNCTION PANEL
ALK PHOS: 54 U/L (ref 39–117)
ALT: 23 U/L (ref 0–53)
AST: 21 U/L (ref 0–37)
Albumin: 3.8 g/dL (ref 3.5–5.2)
BILIRUBIN DIRECT: 0.2 mg/dL (ref 0.0–0.3)
BILIRUBIN TOTAL: 0.8 mg/dL (ref 0.2–1.2)
TOTAL PROTEIN: 6.4 g/dL (ref 6.0–8.3)

## 2018-02-13 LAB — CBC WITH DIFFERENTIAL/PLATELET
BASOS ABS: 0 10*3/uL (ref 0.0–0.1)
BASOS PCT: 0.8 % (ref 0.0–3.0)
EOS ABS: 0.1 10*3/uL (ref 0.0–0.7)
Eosinophils Relative: 1.4 % (ref 0.0–5.0)
HEMATOCRIT: 42.4 % (ref 39.0–52.0)
Hemoglobin: 14.5 g/dL (ref 13.0–17.0)
LYMPHS ABS: 1.3 10*3/uL (ref 0.7–4.0)
LYMPHS PCT: 25.8 % (ref 12.0–46.0)
MCHC: 34.3 g/dL (ref 30.0–36.0)
MCV: 86.3 fl (ref 78.0–100.0)
Monocytes Absolute: 0.4 10*3/uL (ref 0.1–1.0)
Monocytes Relative: 7.3 % (ref 3.0–12.0)
NEUTROS ABS: 3.3 10*3/uL (ref 1.4–7.7)
NEUTROS PCT: 64.7 % (ref 43.0–77.0)
PLATELETS: 130 10*3/uL — AB (ref 150.0–400.0)
RBC: 4.91 Mil/uL (ref 4.22–5.81)
RDW: 13.6 % (ref 11.5–15.5)
WBC: 5 10*3/uL (ref 4.0–10.5)

## 2018-02-13 LAB — LIPID PANEL
CHOL/HDL RATIO: 3
Cholesterol: 98 mg/dL (ref 0–200)
HDL: 36.1 mg/dL — AB (ref >39.00)
LDL Cholesterol: 52 mg/dL (ref 0–99)
NONHDL: 62.33
TRIGLYCERIDES: 51 mg/dL (ref 0.0–149.0)
VLDL: 10.2 mg/dL (ref 0.0–40.0)

## 2018-02-13 LAB — BASIC METABOLIC PANEL
BUN: 23 mg/dL (ref 6–23)
CALCIUM: 9 mg/dL (ref 8.4–10.5)
CHLORIDE: 103 meq/L (ref 96–112)
CO2: 31 meq/L (ref 19–32)
CREATININE: 1.2 mg/dL (ref 0.40–1.50)
GFR: 62.78 mL/min (ref >60.00)
Glucose, Bld: 125 mg/dL — ABNORMAL HIGH (ref 70–99)
Potassium: 3.5 mEq/L (ref 3.5–5.1)
Sodium: 139 mEq/L (ref 135–145)

## 2018-02-13 LAB — PSA: PSA: 2.21 ng/mL (ref 0.10–4.00)

## 2018-02-13 LAB — TSH: TSH: 1.62 u[IU]/mL (ref 0.35–4.50)

## 2018-02-13 NOTE — Progress Notes (Signed)
Subjective:    Patient ID: Brandon [name], male    DOB: [birth year], 72 y.o.   MRN: [id number]  HPI Patient presents for yearly preventative medicine examination. He is a pleasant 72 year old male who  has a past medical history of Allergy, Basal cell carcinoma, GERD (gastroesophageal reflux disease), History of kidney cancer, and Hypertension.  Essential Hypertension - Takes lisinopril-HCTZ 20-25 mg. He is very well controlled on this medication.  He denies any episodes of lightheadedness dizziness or syncopal episodes BP Readings from Last 3 Encounters:  02/13/18 126/74  05/03/17 110/80  03/07/17 118/80   Hyperlipidemia - Takes Lipitor - controlled Lab Results  Component Value Date   CHOL 160 01/31/2017   HDL 45.60 01/31/2017   LDLCALC 102 (H) 01/31/2017   TRIG 63.0 01/31/2017   CHOLHDL 4 01/31/2017   BPH -elevated PSA last year, he was seen by urology and had biopsy done with was concerning for slow growing cancer. He was started on Proscar in addition to Flomax. Reports that he continues to get up the the restroom at night frequently.   All immunizations and health maintenance protocols were reviewed with the patient and needed orders were placed. He is due for flu vaccination   Appropriate screening laboratory values were ordered for the patient including screening of hyperlipidemia, renal function and hepatic function. If indicated by BPH, a PSA was ordered.  Medication reconciliation,  past medical history, social history, problem list and allergies were reviewed in detail with the patient  Goals were established with regard to weight loss, exercise, and  diet in compliance with medications Wt Readings from Last 3 Encounters:  02/13/18 249 lb 9.6 oz (113.2 kg)  05/03/17 248 lb 12.8 oz (112.9 kg)  03/07/17 249 lb (112.9 kg)   End of life planning was discussed.  He is up-to-date on his colonoscopy. He has not had his dental or vision screens this year.   He has no  acute complaints.   Review of Systems  Constitutional: Negative.   HENT: Positive for hearing loss.   Eyes: Negative.   Respiratory: Negative.   Cardiovascular: Negative.   Gastrointestinal: Negative.   Endocrine: Negative.   Genitourinary: Negative.   Musculoskeletal: Negative.   Skin: Negative.   Allergic/Immunologic: Negative.   Neurological: Negative.   Hematological: Negative.   Psychiatric/Behavioral: Negative.   All other systems reviewed and are negative.  Past Medical History:  Diagnosis Date  . Allergy   . Basal cell carcinoma   . GERD (gastroesophageal reflux disease)   . History of kidney cancer    benign; told not cancerous  . Hypertension     Social History   Socioeconomic History  . Marital status: Married    Spouse name: Not on file  . Number of children: Not on file  . Years of education: Not on file  . Highest education level: Not on file  Occupational History  . Not on file  Social Needs  . Financial resource strain: Not on file  . Food insecurity:    Worry: Not on file    Inability: Not on file  . Transportation needs:    Medical: Not on file    Non-medical: Not on file  Tobacco Use  . Smoking status: Former Research scientist (life sciences)  . Smokeless tobacco: Never Used  Substance and Sexual Activity  . Alcohol use: Yes    Alcohol/week: 0.0 standard drinks    Comment: "Beer every once in a while"  . Drug use: No  .  Sexual activity: Not on file  Lifestyle  . Physical activity:    Days per week: Not on file    Minutes per session: Not on file  . Stress: Not on file  Relationships  . Social connections:    Talks on phone: Not on file    Gets together: Not on file    Attends religious service: Not on file    Active member of club or organization: Not on file    Attends meetings of clubs or organizations: Not on file    Relationship status: Not on file  . Intimate partner violence:    Fear of current or ex partner: Not on file    Emotionally abused: Not  on file    Physically abused: Not on file    Forced sexual activity: Not on file  Other Topics Concern  . Not on file  Social History Narrative   Retired from [employer]   Married    Three children (Two in [place] and one in [place])     Past Surgical History:  Procedure Laterality Date  . CATARACT EXTRACTION Bilateral   . HERNIA REPAIR     x2  . KIDNEY SURGERY Right    Had right kidney removed.   . TONSILLECTOMY    . VASECTOMY      Family History  Problem Relation Age of Onset  . Cancer Mother        Breast   . Cancer Father        Lung   . Colon cancer Neg Hx     No Known Allergies  Current Outpatient Medications on File Prior to Visit  Medication Sig Dispense Refill  . atorvastatin (LIPITOR) 20 MG tablet TAKE 1 TABLET EVERY DAY 90 tablet 0  . cholecalciferol (VITAMIN D) 1000 units tablet Take 1,000 Units by mouth daily.    . Cyanocobalamin (VITAMIN B-12 PO)     . finasteride (PROSCAR) 5 MG tablet     . lisinopril-hydrochlorothiazide (PRINZIDE,ZESTORETIC) 20-25 MG tablet TAKE 1 TABLET EVERY DAY 90 tablet 0  . tamsulosin (FLOMAX) 0.4 MG CAPS capsule Take 1 capsule (0.4 mg total) by mouth daily. (Patient taking differently: Take 0.4 mg by mouth 2 (two) times daily. ) 90 capsule 2  . vitamin C (ASCORBIC ACID) 500 MG tablet      No current facility-administered medications on file prior to visit.     BP 126/74 (BP Location: Left Arm, Patient Position: Sitting, Cuff Size: Normal)   Pulse 68   Temp 98.4 F (36.9 C) (Oral)   Ht 6\' 8"  (2.032 m)   Wt 249 lb 9.6 oz (113.2 kg)   SpO2 97%   BMI 27.42 kg/m       Objective:   Physical Exam  Constitutional: He is oriented to person, place, and time. He appears well-developed and well-nourished. No distress.  HENT:  Head: Normocephalic and atraumatic.  Right Ear: External ear normal.  Left Ear: External ear normal.  Nose: Nose normal.  Mouth/Throat: Oropharynx is clear and moist. No oropharyngeal exudate.  Eyes:  Pupils are equal, round, and reactive to light. Conjunctivae and EOM are normal. Right eye exhibits no discharge. Left eye exhibits no discharge. No scleral icterus.  Neck: Normal range of motion. Neck supple. No JVD present. No tracheal deviation present. No thyromegaly present.  Cardiovascular: Normal rate, regular rhythm and intact distal pulses. Exam reveals no gallop and no friction rub.  Murmur heard. Pulmonary/Chest: Effort normal and breath sounds  normal. No stridor. No respiratory distress. He has no wheezes. He has no rales. He exhibits no tenderness.  Abdominal: Soft. Bowel sounds are normal. He exhibits no distension and no mass. There is no tenderness. There is no rebound and no guarding. No hernia.  Musculoskeletal: Normal range of motion. He exhibits no edema, tenderness or deformity.  Lymphadenopathy:    He has no cervical adenopathy.  Neurological: He is alert and oriented to person, place, and time. He displays normal reflexes. No cranial nerve deficit or sensory deficit. He exhibits normal muscle tone. Coordination normal.  Skin: Skin is warm and dry. Capillary refill takes less than 2 seconds. No rash noted. He is not diaphoretic. No erythema. No pallor.  Psychiatric: He has a normal mood and affect. His behavior is normal. Judgment and thought content normal.  Nursing note and vitals reviewed.     Assessment & Plan:  1. Routine general medical examination at a health care facility - Encouraged heart healthy diet and exercise  - Follow up in one year or sooner if needed - Basic metabolic panel - CBC with Differential/Platelet - Hepatic function panel - Lipid panel - TSH  2. Benign prostatic hyperplasia without lower urinary tract symptoms  - PSA  3. Essential hypertension - Well controlled. No change in medications  - Basic metabolic panel - CBC with Differential/Platelet - Hepatic function panel - Lipid panel - TSH  4. Hyperlipidemia, unspecified  hyperlipidemia type - Consider increase in statin  - Basic metabolic panel - CBC with Differential/Platelet - Hepatic function panel - Lipid panel - TSH  5. Need for influenza vaccination - Flu vaccine HIGH DOSE PF (Fluzone High dose)  Dorothyann Peng, NP

## 2018-03-21 DIAGNOSIS — C61 Malignant neoplasm of prostate: Secondary | ICD-10-CM | POA: Diagnosis not present

## 2018-03-27 DIAGNOSIS — N5201 Erectile dysfunction due to arterial insufficiency: Secondary | ICD-10-CM | POA: Diagnosis not present

## 2018-03-27 DIAGNOSIS — R3915 Urgency of urination: Secondary | ICD-10-CM | POA: Diagnosis not present

## 2018-03-27 DIAGNOSIS — C61 Malignant neoplasm of prostate: Secondary | ICD-10-CM | POA: Diagnosis not present

## 2018-04-30 ENCOUNTER — Other Ambulatory Visit: Payer: Self-pay | Admitting: Adult Health

## 2018-04-30 DIAGNOSIS — I10 Essential (primary) hypertension: Secondary | ICD-10-CM

## 2018-05-01 NOTE — Telephone Encounter (Signed)
Sent to the pharmacy by e-scribe. 

## 2018-07-25 DIAGNOSIS — H26491 Other secondary cataract, right eye: Secondary | ICD-10-CM | POA: Diagnosis not present

## 2018-07-28 ENCOUNTER — Other Ambulatory Visit: Payer: Self-pay | Admitting: Urology

## 2018-07-28 DIAGNOSIS — C61 Malignant neoplasm of prostate: Secondary | ICD-10-CM

## 2018-10-09 ENCOUNTER — Other Ambulatory Visit: Payer: Self-pay

## 2018-10-09 ENCOUNTER — Ambulatory Visit
Admission: RE | Admit: 2018-10-09 | Discharge: 2018-10-09 | Disposition: A | Discharge status: Home / Self Care | Payer: Medicare HMO | Source: Ambulatory Visit | Attending: Urology | Admitting: Urology

## 2018-10-09 DIAGNOSIS — C61 Malignant neoplasm of prostate: Secondary | ICD-10-CM

## 2018-10-09 DIAGNOSIS — N401 Enlarged prostate with lower urinary tract symptoms: Secondary | ICD-10-CM | POA: Diagnosis not present

## 2018-10-09 IMAGING — MR MR PROSTATE WO/W CM
56 series · 56 of 56 positions shown · IV contrast (20 ml multihance)
Comparison: None.

CLINICAL DATA: Prostate cancer. PSA level of 2.05 on [DATE].
Biopsy on [DATE] revealed Gleason 3+3=6 prostate adenocarcinoma
in 20% of 1 core at the right mid lateral gland.

EXAM:
MR PROSTATE WITHOUT AND WITH CONTRAST
TECHNIQUE: Multiplanar multisequence MRI images were obtained of the pelvis
centered about the prostate. Pre and post contrast images were
obtained.
CONTRAST:  20mL MULTIHANCE GADOBENATE DIMEGLUMINE 529 MG/ML IV SOLN

[Series 3: T1 · axial · 8.0mm · 1.06mm/px · 1 of 28 slices shown (1 of 2)]
[im 1/28]
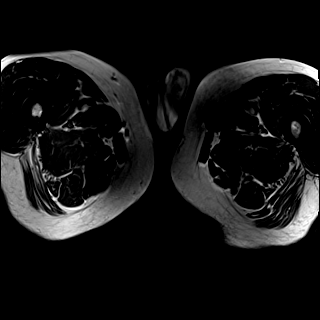

[Series 4: bSSFP fat-sat · axial · 8.0mm · 0.74mm/px · 1 of 28 slices shown]
[im 1/28]
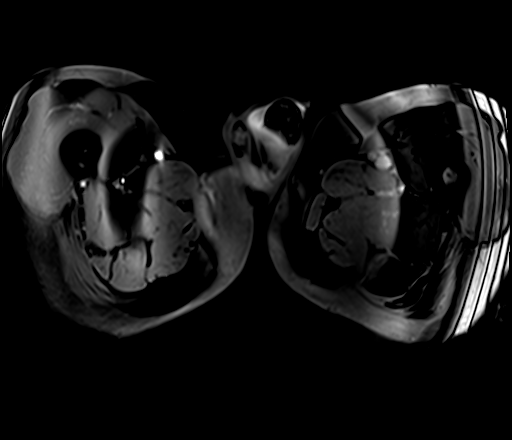

[Series 5: T2 · sagittal · 3.5mm · 0.56mm/px · 1 of 39 slices shown (1 of 4)]
[im 1/39]
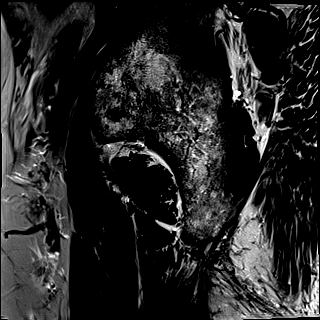

[Series 6: T1 · axial · 3.0mm · 0.31mm/px · 1 of 28 slices shown (2 of 2)]
[im 1/28]
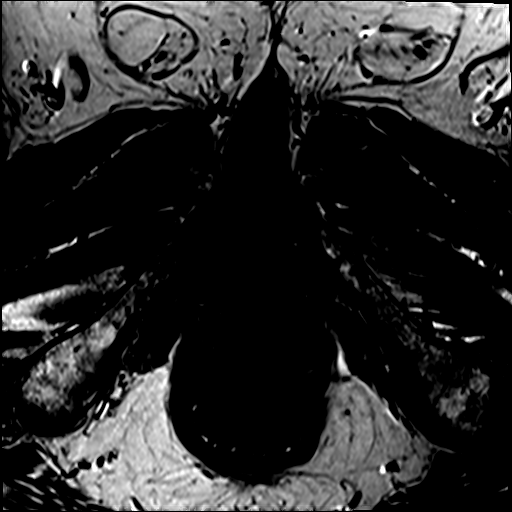

[Series 7: T2 · axial · 3.5mm · 0.56mm/px · 1 of 23 slices shown (2 of 4)]
[im 1/23]
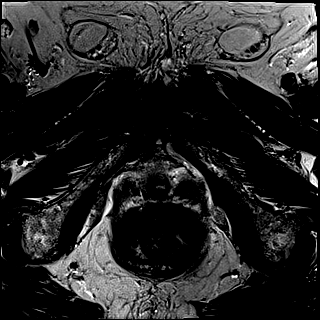

[Series 8: T2 · axial · 1.0mm · 1.04mm/px · 1 of 88 slices shown (3 of 4)]
[im 1/88]
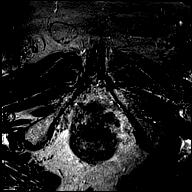

[Series 9: T2 · coronal · 3.5mm · 0.56mm/px · 1 of 23 slices shown (4 of 4)]
[im 1/23]
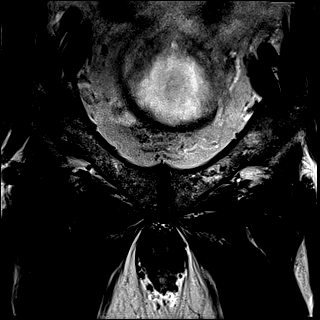

[Series 10: DWI · axial · 3.5mm · 1.56mm/px · 1 of 72 slices shown (1 of 2)]
[im 1/72]
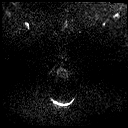

[Series 11: DWI · axial · 3.5mm · 1.56mm/px · 1 of 24 slices shown (2 of 2)]
[im 1/24]
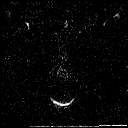

[Series 12: pre t1_twist_tra_dyn_ttc=6.4s · axial · non-contrast · 3.5mm · 0.83mm/px · 1 of 26 slices shown]
[im 1/26]
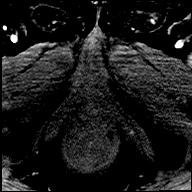

[Series 13: post t1_twist_tra_dyn-copy center · axial · 3.5mm · 0.83mm/px · 1 of 26 slices shown (1 of 24)]
[im 1/26]
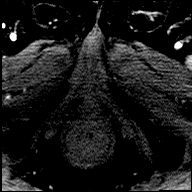

[Series 14: post t1_twist_tra_dyn-copy center · axial · 3.5mm · 0.83mm/px · 1 of 26 slices shown (2 of 24)]
[im 1/26]
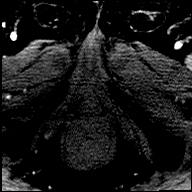

[Series 15: post t1_twist_tra_dyn-copy cent_sub_ttc=(id) · axial · 3.5mm · 0.83mm/px · 1 of 26 slices shown (1 of 22)]
[im 1/26]
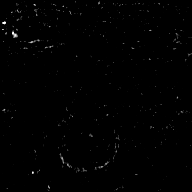

[Series 16: post t1_twist_tra_dyn-copy center · axial · 3.5mm · 0.83mm/px · 1 of 26 slices shown (3 of 24)]
[im 1/26]
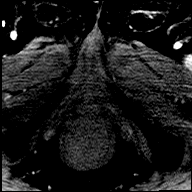

[Series 17: post t1_twist_tra_dyn-copy cent_sub_ttc=(id) · axial · 3.5mm · 0.83mm/px · 1 of 26 slices shown (2 of 22)]
[im 1/26]
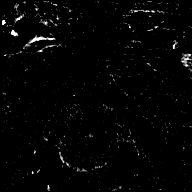

[Series 18: post t1_twist_tra_dyn-copy center · axial · 3.5mm · 0.83mm/px · 1 of 26 slices shown (4 of 24)]
[im 1/26]
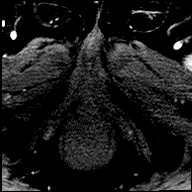

[Series 19: post t1_twist_tra_dyn-copy cent_sub_ttc=(id) · axial · 3.5mm · 0.83mm/px · 1 of 26 slices shown (3 of 22)]
[im 1/26]
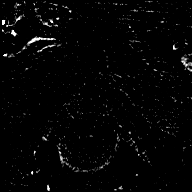

[Series 20: post t1_twist_tra_dyn-copy center · axial · 3.5mm · 0.83mm/px · 1 of 26 slices shown (5 of 24)]
[im 1/26]
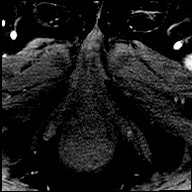

[Series 21: post t1_twist_tra_dyn-copy cent_sub_ttc=(id) · axial · 3.5mm · 0.83mm/px · 1 of 26 slices shown (4 of 22)]
[im 1/26]
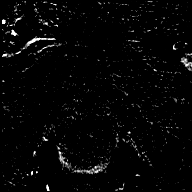

[Series 22: post t1_twist_tra_dyn-copy center · axial · 3.5mm · 0.83mm/px · 1 of 26 slices shown (6 of 24)]
[im 1/26]
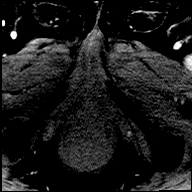

[Series 23: post t1_twist_tra_dyn-copy cent_sub_ttc=(id) · axial · 3.5mm · 0.83mm/px · 1 of 26 slices shown (5 of 22)]
[im 1/26]
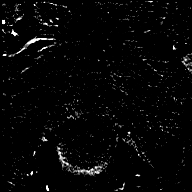

[Series 24: post t1_twist_tra_dyn-copy center · axial · 3.5mm · 0.83mm/px · 1 of 26 slices shown (7 of 24)]
[im 1/26]
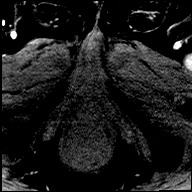

[Series 25: post t1_twist_tra_dyn-copy cent_sub_ttc=(id) · axial · 3.5mm · 0.83mm/px · 1 of 26 slices shown (6 of 22)]
[im 1/26]
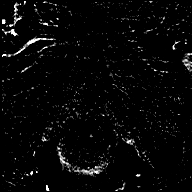

[Series 26: post t1_twist_tra_dyn-copy center · axial · 3.5mm · 0.83mm/px · 1 of 26 slices shown (8 of 24)]
[im 1/26]
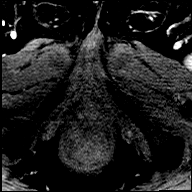

[Series 27: post t1_twist_tra_dyn-copy cent_sub_ttc=(id) · axial · 3.5mm · 0.83mm/px · 1 of 26 slices shown (7 of 22)]
[im 1/26]
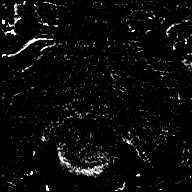

[Series 28: post t1_twist_tra_dyn-copy center · axial · 3.5mm · 0.83mm/px · 1 of 26 slices shown (9 of 24)]
[im 1/26]
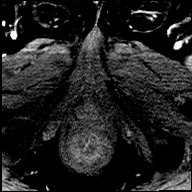

[Series 29: post t1_twist_tra_dyn-copy cent_sub_ttc=(id) · axial · 3.5mm · 0.83mm/px · 1 of 26 slices shown (8 of 22)]
[im 1/26]
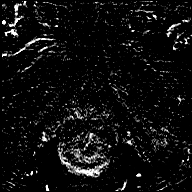

[Series 30: post t1_twist_tra_dyn-copy center · axial · 3.5mm · 0.83mm/px · 1 of 26 slices shown (10 of 24)]
[im 1/26]
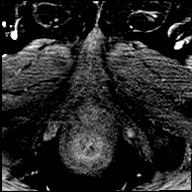

[Series 31: post t1_twist_tra_dyn-copy cent_sub_ttc=(id) · axial · 3.5mm · 0.83mm/px · 1 of 26 slices shown (9 of 22)]
[im 1/26]
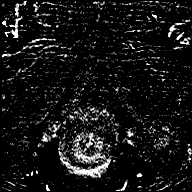

[Series 32: post t1_twist_tra_dyn-copy center · axial · 3.5mm · 0.83mm/px · 1 of 26 slices shown (11 of 24)]
[im 1/26]
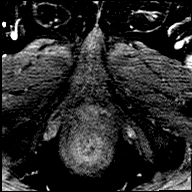

[Series 33: post t1_twist_tra_dyn-copy cent_sub_ttc=(id) · axial · 3.5mm · 0.83mm/px · 1 of 26 slices shown (10 of 22)]
[im 1/26]
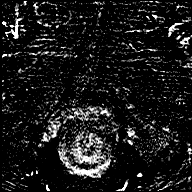

[Series 34: post t1_twist_tra_dyn-copy center · axial · 3.5mm · 0.83mm/px · 1 of 26 slices shown (12 of 24)]
[im 1/26]
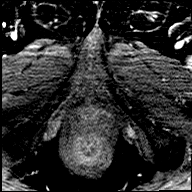

[Series 35: post t1_twist_tra_dyn-copy cent_sub_ttc=(id) · axial · 3.5mm · 0.83mm/px · 1 of 26 slices shown (11 of 22)]
[im 1/26]
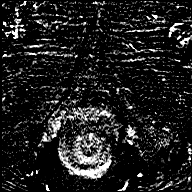

[Series 36: post t1_twist_tra_dyn-copy center · axial · 3.5mm · 0.83mm/px · 1 of 26 slices shown (13 of 24)]
[im 1/26]
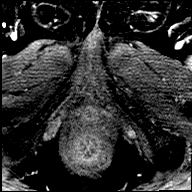

[Series 37: post t1_twist_tra_dyn-copy cent_sub_ttc=(id) · axial · 3.5mm · 0.83mm/px · 1 of 26 slices shown (12 of 22)]
[im 1/26]
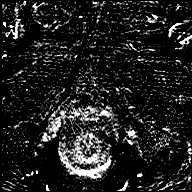

[Series 38: post t1_twist_tra_dyn-copy center · axial · 3.5mm · 0.83mm/px · 1 of 26 slices shown (14 of 24)]
[im 1/26]
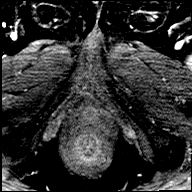

[Series 39: post t1_twist_tra_dyn-copy cent_sub_ttc=(id) · axial · 3.5mm · 0.83mm/px · 1 of 26 slices shown (13 of 22)]
[im 1/26]
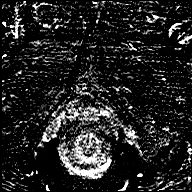

[Series 40: post t1_twist_tra_dyn-copy center · axial · 3.5mm · 0.83mm/px · 1 of 26 slices shown (15 of 24)]
[im 1/26]
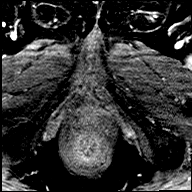

[Series 41: post t1_twist_tra_dyn-copy cent_sub_ttc=(id) · axial · 3.5mm · 0.83mm/px · 1 of 26 slices shown (14 of 22)]
[im 1/26]
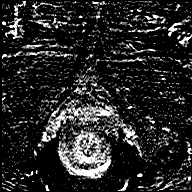

[Series 42: post t1_twist_tra_dyn-copy center · axial · 3.5mm · 0.83mm/px · 1 of 26 slices shown (16 of 24)]
[im 1/26]
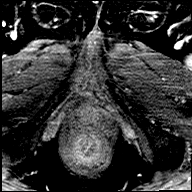

[Series 43: post t1_twist_tra_dyn-copy cent_sub_ttc=(id) · axial · 3.5mm · 0.83mm/px · 1 of 26 slices shown (15 of 22)]
[im 1/26]
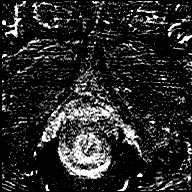

[Series 44: post t1_twist_tra_dyn-copy center · axial · 3.5mm · 0.83mm/px · 1 of 26 slices shown (17 of 24)]
[im 1/26]
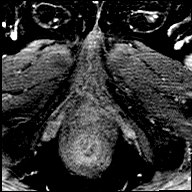

[Series 45: post t1_twist_tra_dyn-copy cent_sub_ttc=(id) · axial · 3.5mm · 0.83mm/px · 1 of 26 slices shown (16 of 22)]
[im 1/26]
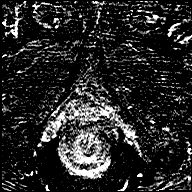

[Series 46: post t1_twist_tra_dyn-copy center · axial · 3.5mm · 0.83mm/px · 1 of 26 slices shown (18 of 24)]
[im 1/26]
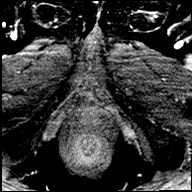

[Series 47: post t1_twist_tra_dyn-copy cent_sub_ttc=(id) · axial · 3.5mm · 0.83mm/px · 1 of 26 slices shown (17 of 22)]
[im 1/26]
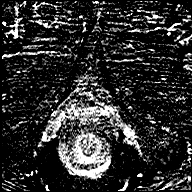

[Series 48: post t1_twist_tra_dyn-copy center · axial · 3.5mm · 0.83mm/px · 1 of 26 slices shown (19 of 24)]
[im 1/26]
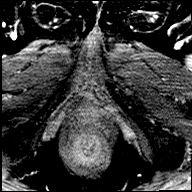

[Series 49: post t1_twist_tra_dyn-copy cent_sub_ttc=(id) · axial · 3.5mm · 0.83mm/px · 1 of 26 slices shown (18 of 22)]
[im 1/26]
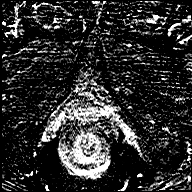

[Series 50: post t1_twist_tra_dyn-copy center · axial · 3.5mm · 0.83mm/px · 1 of 26 slices shown (20 of 24)]
[im 1/26]
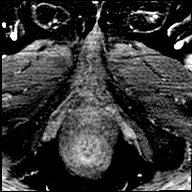

[Series 51: post t1_twist_tra_dyn-copy cent_sub_ttc=(id) · axial · 3.5mm · 0.83mm/px · 1 of 26 slices shown (19 of 22)]
[im 1/26]
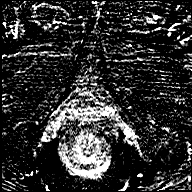

[Series 52: post t1_twist_tra_dyn-copy center · axial · 3.5mm · 0.83mm/px · 1 of 26 slices shown (21 of 24)]
[im 1/26]
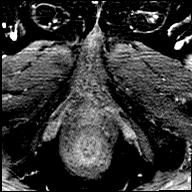

[Series 53: post t1_twist_tra_dyn-copy cent_sub_ttc=(id) · axial · 3.5mm · 0.83mm/px · 1 of 26 slices shown (20 of 22)]
[im 1/26]
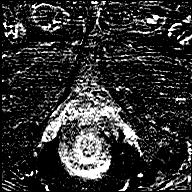

[Series 54: post t1_twist_tra_dyn-copy center · axial · 3.5mm · 0.83mm/px · 1 of 26 slices shown (22 of 24)]
[im 1/26]
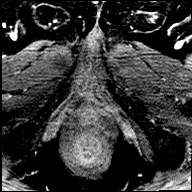

[Series 55: post t1_twist_tra_dyn-copy cent_sub_ttc=(id) · axial · 3.5mm · 0.83mm/px · 1 of 26 slices shown (21 of 22)]
[im 1/26]
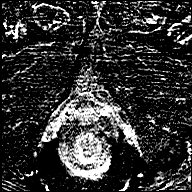

[Series 56: post t1_twist_tra_dyn-copy center · axial · 3.5mm · 0.83mm/px · 1 of 26 slices shown (23 of 24)]
[im 1/26]
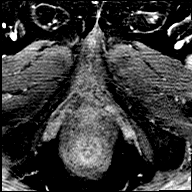

[Series 57: post t1_twist_tra_dyn-copy cent_sub_ttc=(id) · axial · 3.5mm · 0.83mm/px · 1 of 26 slices shown (22 of 22)]
[im 1/26]
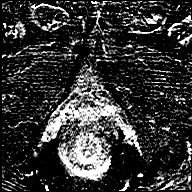

[Series 58: post t1_twist_tra_dyn-copy center · axial · 3.5mm · 0.83mm/px · 1 of 26 slices shown (24 of 24)]
[im 1/26]
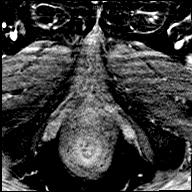

[56 of 56 positions shown; findings below may reference images not displayed]

FINDINGS: Prostate: Typical findings of benign prostatic hypertrophy noted.
Somewhat thin peripheral zone noted without discrete focal lesion of
PI-RADS category 3 or higher. There is some streaky T2 hypodensity
in the peripheral zone but without a significant degree of
hypointensity on ADC map images without associated enhancement or
accentuated restriction of diffusion on high B value images.

Volume: 3D volumetric analysis: Prostate volume 133.89 cubic cm (6.5
by 5.9 by 6.9 cm).

Transcapsular spread:  Absent

Seminal vesicle involvement: Absent

Neurovascular bundle involvement: Absent

Pelvic adenopathy: Absent

Bone metastasis: Absent

Other findings: No supplemental non-categorized findings.
IMPRESSION: 1. No definite identified lesions characteristic of prostate cancer.
2. Typical findings of benign prostatic hypertrophy. Prostate volume
approximately 134 cubic cm.

## 2018-10-09 MED ORDER — GADOBENATE DIMEGLUMINE 529 MG/ML IV SOLN
20.0000 mL | Freq: Once | INTRAVENOUS | Status: AC | PRN
  Administered 2018-10-09: 09:00:00 20 mL via INTRAVENOUS

## 2018-10-13 DIAGNOSIS — C61 Malignant neoplasm of prostate: Secondary | ICD-10-CM | POA: Diagnosis not present

## 2018-10-16 DIAGNOSIS — Z905 Acquired absence of kidney: Secondary | ICD-10-CM | POA: Diagnosis not present

## 2018-10-16 DIAGNOSIS — C61 Malignant neoplasm of prostate: Secondary | ICD-10-CM | POA: Diagnosis not present

## 2018-10-16 DIAGNOSIS — N5201 Erectile dysfunction due to arterial insufficiency: Secondary | ICD-10-CM | POA: Diagnosis not present

## 2018-10-16 DIAGNOSIS — R3915 Urgency of urination: Secondary | ICD-10-CM | POA: Diagnosis not present

## 2019-01-31 ENCOUNTER — Other Ambulatory Visit: Payer: Self-pay | Admitting: Adult Health

## 2019-01-31 DIAGNOSIS — I10 Essential (primary) hypertension: Secondary | ICD-10-CM

## 2019-02-03 ENCOUNTER — Encounter: Payer: Self-pay | Admitting: Family Medicine

## 2019-02-03 NOTE — Telephone Encounter (Signed)
Sent to the pharmacy by e-scribe for 90 days.  Letter released to MyChart. 

## 2019-02-11 ENCOUNTER — Encounter: Payer: Self-pay | Admitting: Adult Health

## 2019-02-12 ENCOUNTER — Encounter: Payer: Self-pay | Admitting: Gastroenterology

## 2019-04-14 DIAGNOSIS — N5201 Erectile dysfunction due to arterial insufficiency: Secondary | ICD-10-CM | POA: Diagnosis not present

## 2019-04-14 DIAGNOSIS — C61 Malignant neoplasm of prostate: Secondary | ICD-10-CM | POA: Diagnosis not present

## 2019-04-14 DIAGNOSIS — R3915 Urgency of urination: Secondary | ICD-10-CM | POA: Diagnosis not present

## 2019-05-27 NOTE — Progress Notes (Signed)
Subjective:    Patient ID: Brandon [name], male    DOB: 1945-11-13, 74 y.o.   MRN: [id number]  HPI Patient presents for yearly preventative medicine examination. He is a pleasant 74 year old male who  has a past medical history of Allergy, Basal cell carcinoma, GERD (gastroesophageal reflux disease), History of kidney cancer, and Hypertension.  Essential Hypertension -currently prescribed lisinopril/hydrochlorothiazide 20-25 mg.  He is very well controlled on this medication.  He denies episodes of lightheadedness, dizziness, headaches, blurred vision or syncope. BP Readings from Last 3 Encounters:  05/28/19 120/78  02/13/18 126/74  05/03/17 110/80   Hyperlipidemia - takes Lipitor. He denies myalgia or fatigue  Lab Results  Component Value Date   CHOL 98 02/13/2018   HDL 36.10 (L) 02/13/2018   LDLCALC 52 02/13/2018   TRIG 51.0 02/13/2018   CHOLHDL 3 02/13/2018   Malignant Neoplasm of Prostate -is currently managed by urology.  On proscar and Flomax. Is seen every 6 months. No active treatment due to slow growing nature.   All immunizations and health maintenance protocols were reviewed with the patient and needed orders were placed. He is up to date on vaccinations   Appropriate screening laboratory values were ordered for the patient including screening of hyperlipidemia, renal function and hepatic function.  Medication reconciliation,  past medical history, social history, problem list and allergies were reviewed in detail with the patient  Goals were established with regard to weight loss, exercise, and  diet in compliance with medications, he has not been exercising much but tries to follow a heart healthy diet  Wt Readings from Last 3 Encounters:  05/28/19 251 lb (113.9 kg)  02/13/18 249 lb 9.6 oz (113.2 kg)  05/03/17 248 lb 12.8 oz (112.9 kg)   End of life planning was discussed. He has an advanced directive and living will.   Review of Systems  Constitutional:  Negative.   HENT: Positive for hearing loss.   Eyes: Negative.   Respiratory: Negative.   Cardiovascular: Negative.   Gastrointestinal: Negative.   Endocrine: Negative.   Genitourinary: Negative.   Musculoskeletal: Negative.   Skin: Negative.   Allergic/Immunologic: Negative.   Neurological: Negative.   Hematological: Negative.   Psychiatric/Behavioral: Negative.   All other systems reviewed and are negative.  Past Medical History:  Diagnosis Date  . Allergy   . Basal cell carcinoma   . GERD (gastroesophageal reflux disease)   . History of kidney cancer    benign; told not cancerous  . Hypertension     Social History   Socioeconomic History  . Marital status: Married    Spouse name: Not on file  . Number of children: Not on file  . Years of education: Not on file  . Highest education level: Not on file  Occupational History  . Not on file  Tobacco Use  . Smoking status: Former Research scientist (life sciences)  . Smokeless tobacco: Never Used  Substance and Sexual Activity  . Alcohol use: Yes    Alcohol/week: 0.0 standard drinks    Comment: "Beer every once in a while"  . Drug use: No  . Sexual activity: Not on file  Other Topics Concern  . Not on file  Social History Narrative   Retired from [employer]   Married    Three children (Two in [place] and one in [place])    Social Determinants of Health   Financial Resource Strain:   . Difficulty of Paying Living Expenses: Not on file  Food Insecurity:   . Worried About Charity fundraiser in the Last Year: Not on file  . Ran Out of Food in the Last Year: Not on file  Transportation Needs:   . Lack of Transportation (Medical): Not on file  . Lack of Transportation (Non-Medical): Not on file  Physical Activity:   . Days of Exercise per Week: Not on file  . Minutes of Exercise per Session: Not on file  Stress:   . Feeling of Stress : Not on file  Social Connections:   . Frequency of Communication with Friends and Family: Not on file    . Frequency of Social Gatherings with Friends and Family: Not on file  . Attends Religious Services: Not on file  . Active Member of Clubs or Organizations: Not on file  . Attends Archivist Meetings: Not on file  . Marital Status: Not on file  Intimate Partner Violence:   . Fear of Current or Ex-Partner: Not on file  . Emotionally Abused: Not on file  . Physically Abused: Not on file  . Sexually Abused: Not on file    Past Surgical History:  Procedure Laterality Date  . CATARACT EXTRACTION Bilateral   . HERNIA REPAIR     x2  . KIDNEY SURGERY Right    Had right kidney removed.   . TONSILLECTOMY    . VASECTOMY      Family History  Problem Relation Age of Onset  . Cancer Mother        Breast   . Cancer Father        Lung   . Colon cancer Neg Hx     No Known Allergies  Current Outpatient Medications on File Prior to Visit  Medication Sig Dispense Refill  . cholecalciferol (VITAMIN D) 1000 units tablet Take 1,000 Units by mouth daily.    . Cyanocobalamin (VITAMIN B-12 PO)     . finasteride (PROSCAR) 5 MG tablet     . FLUZONE HIGH-DOSE QUADRIVALENT 0.7 ML SUSY     . SHINGRIX injection     . tamsulosin (FLOMAX) 0.4 MG CAPS capsule Take 1 capsule (0.4 mg total) by mouth daily. (Patient taking differently: Take 0.4 mg by mouth 2 (two) times daily. ) 90 capsule 2  . vitamin C (ASCORBIC ACID) 500 MG tablet      No current facility-administered medications on file prior to visit.    BP 120/78   Pulse 69   Temp (!) 97.2 F (36.2 C) (Other (Comment))   Ht 6' 6.75" (2 m)   Wt 251 lb (113.9 kg)   SpO2 97%   BMI 28.46 kg/m       Objective:   Physical Exam Vitals and nursing note reviewed.  Constitutional:      General: He is not in acute distress.    Appearance: He is well-developed. He is obese. He is not diaphoretic.  HENT:     Head: Normocephalic and atraumatic.     Right Ear: Tympanic membrane, ear canal and external ear normal.     Left Ear:  Tympanic membrane, ear canal and external ear normal. There is impacted cerumen.     Nose: Nose normal. No congestion or rhinorrhea.     Mouth/Throat:     Mouth: Mucous membranes are moist.     Pharynx: Oropharynx is clear. No oropharyngeal exudate or posterior oropharyngeal erythema.  Eyes:     General:        Right eye: No discharge.  Left eye: No discharge.     Conjunctiva/sclera: Conjunctivae normal.     Pupils: Pupils are equal, round, and reactive to light.  Neck:     Thyroid: No thyromegaly.     Trachea: No tracheal deviation.  Cardiovascular:     Rate and Rhythm: Normal rate and regular rhythm.     Pulses: Normal pulses.     Heart sounds: Murmur present. No friction rub. No gallop.   Pulmonary:     Effort: Pulmonary effort is normal. No respiratory distress.     Breath sounds: Normal breath sounds. No stridor. No wheezing, rhonchi or rales.  Chest:     Chest wall: No tenderness.  Abdominal:     General: Bowel sounds are normal. There is no distension.     Palpations: Abdomen is soft. There is no mass.     Tenderness: There is no abdominal tenderness. There is no right CVA tenderness, left CVA tenderness, guarding or rebound.     Hernia: No hernia is present.  Musculoskeletal:        General: No swelling, tenderness, deformity or signs of injury. Normal range of motion.     Right lower leg: No edema.     Left lower leg: No edema.  Lymphadenopathy:     Cervical: No cervical adenopathy.  Skin:    General: Skin is warm and dry.     Capillary Refill: Capillary refill takes less than 2 seconds.     Coloration: Skin is not jaundiced or pale.     Findings: No bruising, erythema, lesion or rash.  Neurological:     General: No focal deficit present.     Mental Status: He is alert and oriented to person, place, and time.     Cranial Nerves: No cranial nerve deficit.     Sensory: No sensory deficit.     Motor: No weakness.     Coordination: Coordination normal.      Gait: Gait normal.     Deep Tendon Reflexes: Reflexes normal.  Psychiatric:        Mood and Affect: Mood normal.        Behavior: Behavior normal.        Thought Content: Thought content normal.        Judgment: Judgment normal.       Assessment & Plan:  1. Routine general medical examination at a health care facility - Encouraged to start walking more. Continue with heart healthy diet.  - Follow up in one year or sooner if needed - CBC with Differential/Platelet - CMP - Lipid panel - TSH  2. 2. Prostate cancer ([place]) - Follow up with urology as directed. - PSA  3. Essential hypertension - Well controlled. No change in medication  - CBC with Differential/Platelet - CMP - Lipid panel - TSH - lisinopril-hydrochlorothiazide (ZESTORETIC) 20-25 MG tablet; Take 1 tablet by mouth daily.  Dispense: 90 tablet; Refill: 3  4. Hyperlipidemia, unspecified hyperlipidemia type - Consider change in statin dose - CBC with Differential/Platelet - CMP - Lipid panel - TSH  5. Impacted cerumen of left ear -Warm water was applied and gentle ear lavage was performed. There were no complications and following the disimpaction tympanic membrane was visible. Tympanic membrane was intact following the procedure. Auditory canal was normal. Patient tolerated procedure well.   Dorothyann Peng, NP

## 2019-05-28 ENCOUNTER — Ambulatory Visit (INDEPENDENT_AMBULATORY_CARE_PROVIDER_SITE_OTHER): Payer: Medicare HMO | Admitting: Adult Health

## 2019-05-28 ENCOUNTER — Encounter: Payer: Self-pay | Admitting: Adult Health

## 2019-05-28 ENCOUNTER — Other Ambulatory Visit: Payer: Self-pay

## 2019-05-28 VITALS — BP 120/78 | HR 69 | Temp 97.2°F | Ht 78.75 in | Wt 251.0 lb

## 2019-05-28 DIAGNOSIS — C61 Malignant neoplasm of prostate: Secondary | ICD-10-CM

## 2019-05-28 DIAGNOSIS — H6122 Impacted cerumen, left ear: Secondary | ICD-10-CM

## 2019-05-28 DIAGNOSIS — I10 Essential (primary) hypertension: Secondary | ICD-10-CM

## 2019-05-28 DIAGNOSIS — Z0001 Encounter for general adult medical examination with abnormal findings: Secondary | ICD-10-CM | POA: Diagnosis not present

## 2019-05-28 DIAGNOSIS — E785 Hyperlipidemia, unspecified: Secondary | ICD-10-CM | POA: Diagnosis not present

## 2019-05-28 DIAGNOSIS — Z0000 Encounter for general adult medical examination without abnormal findings: Secondary | ICD-10-CM

## 2019-05-28 LAB — CBC WITH DIFFERENTIAL/PLATELET
Basophils Absolute: 0.1 10*3/uL (ref 0.0–0.1)
Basophils Relative: 1.1 % (ref 0.0–3.0)
Eosinophils Absolute: 0.1 10*3/uL (ref 0.0–0.7)
Eosinophils Relative: 1.4 % (ref 0.0–5.0)
HCT: 42 % (ref 39.0–52.0)
Hemoglobin: 14.6 g/dL (ref 13.0–17.0)
Lymphocytes Relative: 23.8 % (ref 12.0–46.0)
Lymphs Abs: 1.3 10*3/uL (ref 0.7–4.0)
MCHC: 34.7 g/dL (ref 30.0–36.0)
MCV: 86.2 fl (ref 78.0–100.0)
Monocytes Absolute: 0.5 10*3/uL (ref 0.1–1.0)
Monocytes Relative: 8.4 % (ref 3.0–12.0)
Neutro Abs: 3.6 10*3/uL (ref 1.4–7.7)
Neutrophils Relative %: 65.3 % (ref 43.0–77.0)
Platelets: 124 10*3/uL — ABNORMAL LOW (ref 150.0–400.0)
RBC: 4.88 Mil/uL (ref 4.22–5.81)
RDW: 13.5 % (ref 11.5–15.5)
WBC: 5.5 10*3/uL (ref 4.0–10.5)

## 2019-05-28 LAB — LIPID PANEL
Cholesterol: 101 mg/dL (ref 0–200)
HDL: 38.9 mg/dL — ABNORMAL LOW (ref >39.00)
LDL Cholesterol: 54 mg/dL (ref 0–99)
NonHDL: 62.59
Total CHOL/HDL Ratio: 3
Triglycerides: 41 mg/dL (ref 0.0–149.0)
VLDL: 8.2 mg/dL (ref 0.0–40.0)

## 2019-05-28 LAB — COMPREHENSIVE METABOLIC PANEL
ALT: 16 U/L (ref 0–53)
AST: 19 U/L (ref 0–37)
Albumin: 3.8 g/dL (ref 3.5–5.2)
Alkaline Phosphatase: 61 U/L (ref 39–117)
BUN: 19 mg/dL (ref 6–23)
CO2: 30 mEq/L (ref 19–32)
Calcium: 9 mg/dL (ref 8.4–10.5)
Chloride: 104 mEq/L (ref 96–112)
Creatinine, Ser: 1 mg/dL (ref 0.40–1.50)
GFR: 72.65 mL/min (ref >60.00)
Glucose, Bld: 137 mg/dL — ABNORMAL HIGH (ref 70–99)
Potassium: 3.4 mEq/L — ABNORMAL LOW (ref 3.5–5.1)
Sodium: 141 mEq/L (ref 135–145)
Total Bilirubin: 0.8 mg/dL (ref 0.2–1.2)
Total Protein: 6.2 g/dL (ref 6.0–8.3)

## 2019-05-28 LAB — PSA: PSA: 1.62 ng/mL (ref 0.10–4.00)

## 2019-05-28 LAB — TSH: TSH: 1.49 u[IU]/mL (ref 0.35–4.50)

## 2019-05-28 MED ORDER — ATORVASTATIN CALCIUM 20 MG PO TABS: 20.0000 mg | ORAL_TABLET | Freq: Every day | ORAL | 3 refills | Status: DC

## 2019-05-28 MED ORDER — LISINOPRIL-HYDROCHLOROTHIAZIDE 20-25 MG PO TABS: 1.0000 | ORAL_TABLET | Freq: Every day | ORAL | 3 refills | Status: DC

## 2019-05-28 NOTE — Patient Instructions (Signed)
Health Maintenance Due  Topic Date Due  . COLONOSCOPY  02/17/2019    Depression screen [facility] 2/9 05/28/2019 02/13/2018 01/31/2017  Decreased Interest 0 0 0  Down, Depressed, Hopeless 0 0 0  PHQ - 2 Score 0 0 0

## 2019-07-30 DIAGNOSIS — H04123 Dry eye syndrome of bilateral lacrimal glands: Secondary | ICD-10-CM | POA: Diagnosis not present

## 2019-09-22 DIAGNOSIS — L819 Disorder of pigmentation, unspecified: Secondary | ICD-10-CM | POA: Diagnosis not present

## 2019-09-22 DIAGNOSIS — D229 Melanocytic nevi, unspecified: Secondary | ICD-10-CM | POA: Diagnosis not present

## 2019-09-22 DIAGNOSIS — L905 Scar conditions and fibrosis of skin: Secondary | ICD-10-CM | POA: Diagnosis not present

## 2019-09-22 DIAGNOSIS — L814 Other melanin hyperpigmentation: Secondary | ICD-10-CM | POA: Diagnosis not present

## 2019-09-22 DIAGNOSIS — L821 Other seborrheic keratosis: Secondary | ICD-10-CM | POA: Diagnosis not present

## 2019-09-22 DIAGNOSIS — D1801 Hemangioma of skin and subcutaneous tissue: Secondary | ICD-10-CM | POA: Diagnosis not present

## 2019-09-22 DIAGNOSIS — L989 Disorder of the skin and subcutaneous tissue, unspecified: Secondary | ICD-10-CM | POA: Diagnosis not present

## 2019-09-22 DIAGNOSIS — D485 Neoplasm of uncertain behavior of skin: Secondary | ICD-10-CM | POA: Diagnosis not present

## 2019-09-22 DIAGNOSIS — Z85828 Personal history of other malignant neoplasm of skin: Secondary | ICD-10-CM | POA: Diagnosis not present

## 2019-10-13 DIAGNOSIS — C61 Malignant neoplasm of prostate: Secondary | ICD-10-CM | POA: Diagnosis not present

## 2019-10-19 DIAGNOSIS — C61 Malignant neoplasm of prostate: Secondary | ICD-10-CM | POA: Diagnosis not present

## 2019-10-19 DIAGNOSIS — N400 Enlarged prostate without lower urinary tract symptoms: Secondary | ICD-10-CM | POA: Diagnosis not present

## 2019-10-26 DIAGNOSIS — N5201 Erectile dysfunction due to arterial insufficiency: Secondary | ICD-10-CM | POA: Diagnosis not present

## 2019-10-26 DIAGNOSIS — C61 Malignant neoplasm of prostate: Secondary | ICD-10-CM | POA: Diagnosis not present

## 2019-10-26 DIAGNOSIS — R3915 Urgency of urination: Secondary | ICD-10-CM | POA: Diagnosis not present

## 2020-02-29 ENCOUNTER — Other Ambulatory Visit: Payer: Self-pay | Admitting: Adult Health

## 2020-02-29 DIAGNOSIS — I10 Essential (primary) hypertension: Secondary | ICD-10-CM

## 2020-04-11 DIAGNOSIS — C61 Malignant neoplasm of prostate: Secondary | ICD-10-CM | POA: Diagnosis not present

## 2020-04-18 DIAGNOSIS — R3915 Urgency of urination: Secondary | ICD-10-CM | POA: Diagnosis not present

## 2020-04-18 DIAGNOSIS — C61 Malignant neoplasm of prostate: Secondary | ICD-10-CM | POA: Diagnosis not present

## 2020-04-18 DIAGNOSIS — N5201 Erectile dysfunction due to arterial insufficiency: Secondary | ICD-10-CM | POA: Diagnosis not present

## 2020-04-18 DIAGNOSIS — Z905 Acquired absence of kidney: Secondary | ICD-10-CM | POA: Diagnosis not present

## 2020-04-28 ENCOUNTER — Ambulatory Visit (INDEPENDENT_AMBULATORY_CARE_PROVIDER_SITE_OTHER): Payer: Medicare HMO | Admitting: Family Medicine

## 2020-04-28 ENCOUNTER — Other Ambulatory Visit: Payer: Self-pay

## 2020-04-28 ENCOUNTER — Encounter: Payer: Self-pay | Admitting: Family Medicine

## 2020-04-28 VITALS — BP 116/74 | HR 78 | Temp 98.6°F | Wt 255.6 lb

## 2020-04-28 DIAGNOSIS — H9193 Unspecified hearing loss, bilateral: Secondary | ICD-10-CM | POA: Diagnosis not present

## 2020-04-28 DIAGNOSIS — H6123 Impacted cerumen, bilateral: Secondary | ICD-10-CM | POA: Diagnosis not present

## 2020-04-28 NOTE — Patient Instructions (Signed)
Earwax Buildup, Adult The ears produce a substance called earwax that helps keep bacteria out of the ear and protects the skin in the ear canal. Occasionally, earwax can build up in the ear and cause discomfort or hearing loss. What increases the risk? This condition is more likely to develop in people who:  Are male.  Are elderly.  Naturally produce more earwax.  Clean their ears often with cotton swabs.  Use earplugs often.  Use in-ear headphones often.  Wear hearing aids.  Have narrow ear canals.  Have earwax that is overly thick or sticky.  Have eczema.  Are dehydrated.  Have excess hair in the ear canal. What are the signs or symptoms? Symptoms of this condition include:  Reduced or muffled hearing.  A feeling of fullness in the ear or feeling that the ear is plugged.  Fluid coming from the ear.  Ear pain.  Ear itch.  Ringing in the ear.  Coughing.  An obvious piece of earwax that can be seen inside the ear canal. How is this diagnosed? This condition may be diagnosed based on:  Your symptoms.  Your medical history.  An ear exam. During the exam, your health care provider will look into your ear with an instrument called an otoscope. You may have tests, including a hearing test. How is this treated? This condition may be treated by:  Using ear drops to soften the earwax.  Having the earwax removed by a health care provider. The health care provider may: ? Flush the ear with water. ? Use an instrument that has a loop on the end (curette). ? Use a suction device.  Surgery to remove the wax buildup. This may be done in severe cases. Follow these instructions at home:   Take over-the-counter and prescription medicines only as told by your health care provider.  Do not put any objects, including cotton swabs, into your ear. You can clean the opening of your ear canal with a washcloth or facial tissue.  Follow instructions from your health care  provider about cleaning your ears. Do not over-clean your ears.  Drink enough fluid to keep your urine clear or pale yellow. This will help to thin the earwax.  Keep all follow-up visits as told by your health care provider. If earwax builds up in your ears often or if you use hearing aids, consider seeing your health care provider for routine, preventive ear cleanings. Ask your health care provider how often you should schedule your cleanings.  If you have hearing aids, clean them according to instructions from the manufacturer and your health care provider. Contact a health care provider if:  You have ear pain.  You develop a fever.  You have blood, pus, or other fluid coming from your ear.  You have hearing loss.  You have ringing in your ears that does not go away.  Your symptoms do not improve with treatment.  You feel like the room is spinning (vertigo). Summary  Earwax can build up in the ear and cause discomfort or hearing loss.  The most common symptoms of this condition include reduced or muffled hearing and a feeling of fullness in the ear or feeling that the ear is plugged.  This condition may be diagnosed based on your symptoms, your medical history, and an ear exam.  This condition may be treated by using ear drops to soften the earwax or by having the earwax removed by a health care provider.  Do not put any   objects, including cotton swabs, into your ear. You can clean the opening of your ear canal with a washcloth or facial tissue. This information is not intended to replace advice given to you by your health care provider. Make sure you discuss any questions you have with your health care provider. Document Revised: 04/12/2017 Document Reviewed: 07/11/2016 Elsevier Patient Education  2020 [employer]  

## 2020-04-28 NOTE — Progress Notes (Signed)
Subjective:    Patient ID: Brandon [name], male    DOB: 04-18-1946, 74 y.o.   MRN: [id number]  No chief complaint on file. Patient accompanied by his wife.  HPI Patient is a 74 year old male with past medical history significant for HTN, GERD, BPH, HLD, heart murmur, decreased hearing who is followed by Sallee Provencal, NP and who was seen today for ongoing concern. Patient typically wears hearing aids however unable to hear 2/2 wax buildup.  States occurs several times per year.  OTC Debrox eardrops have not helped in the past.  Patient denies ear pain/pressure.  Past Medical History:  Diagnosis Date  . Allergy   . Basal cell carcinoma   . GERD (gastroesophageal reflux disease)   . History of kidney cancer    benign; told not cancerous  . Hypertension     No Known Allergies  ROS General: Denies fever, chills, night sweats, changes in weight, changes in appetite HEENT: Denies headaches, ear pain, changes in vision, rhinorrhea, sore throat  + decreased hearing, concern for earwax buildup CV: Denies CP, palpitations, SOB, orthopnea Pulm: Denies SOB, cough, wheezing GI: Denies abdominal pain, nausea, vomiting, diarrhea, constipation GU: Denies dysuria, hematuria, frequency Msk: Denies muscle cramps, joint pains Neuro: Denies weakness, numbness, tingling Skin: Denies rashes, bruising Psych: Denies depression, anxiety, hallucinations      Objective:    Blood pressure 116/74, pulse 78, temperature 98.6 F (37 C), temperature source Oral, weight 255 lb 9.6 oz (115.9 kg), SpO2 97 %.  Gen. Pleasant, well-nourished, in no distress, normal affect, hard of hearing HEENT: [place]/AT, face symmetric, conjunctiva clear, no scleral icterus, PERRLA, EOMI, nares patent without drainage.  Normal external ears bilaterally.  Left canal occluded with thick, hard appearing cerumen.  Right canal partially occluded with cerumen.   R TM and canal normal after irrigation.  L TM remained occluded after  irrigation.  Curette used to remove a large portion of cerumen.  Hydrogen peroxide and water used to soften remaining cerumen.   Lungs: no accessory muscle use Cardiovascular: RRR, no peripheral edema Musculoskeletal: No deformities, no cyanosis or clubbing, normal tone Neuro:  A&Ox3, CN II-XII intact, normal gait Skin:  Warm, no lesions/ rash   Wt Readings from Last 3 Encounters:  04/28/20 255 lb 9.6 oz (115.9 kg)  05/28/19 251 lb (113.9 kg)  02/13/18 249 lb 9.6 oz (113.2 kg)    Lab Results  Component Value Date   WBC 5.5 05/28/2019   HGB 14.6 05/28/2019   HCT 42.0 05/28/2019   PLT 124.0 (L) 05/28/2019   GLUCOSE 137 (H) 05/28/2019   CHOL 101 05/28/2019   TRIG 41.0 05/28/2019   HDL 38.90 (L) 05/28/2019   LDLCALC 54 05/28/2019   ALT 16 05/28/2019   AST 19 05/28/2019   NA 141 05/28/2019   K 3.4 (L) 05/28/2019   CL 104 05/28/2019   CREATININE 1.00 05/28/2019   BUN 19 05/28/2019   CO2 30 05/28/2019   TSH 1.49 05/28/2019   PSA 1.62 05/28/2019   HGBA1C 5.8 01/31/2017    Assessment/Plan:  Bilateral impacted cerumen -likely 2/2 hearing aid use. -Consent obtained.  Bilateral ears irrigated.  L TM remained occluded after irrigation.  Curette used by this provider to remove a large portion of cerumen.  Hydrogen peroxide and water used to soften remaining cerumen.   -pt to try debrox ear gtts to further soften the remaining portion of cerumen. -Patient advised okay to come in more frequently for ear cleaning if needed  -  Given handout  Decreased Hearing -use hearing aids regularly -continue f/u with Audiology as needed  F/u as needed  Grier Mitts, MD

## 2020-05-31 ENCOUNTER — Encounter: Payer: Medicare HMO | Admitting: Adult Health

## 2020-06-30 ENCOUNTER — Ambulatory Visit (INDEPENDENT_AMBULATORY_CARE_PROVIDER_SITE_OTHER): Payer: Medicare HMO | Admitting: Adult Health

## 2020-06-30 ENCOUNTER — Encounter: Payer: Self-pay | Admitting: Adult Health

## 2020-06-30 ENCOUNTER — Other Ambulatory Visit: Payer: Self-pay

## 2020-06-30 VITALS — BP 102/72 | Temp 98.4°F | Ht 79.0 in | Wt 251.0 lb

## 2020-06-30 DIAGNOSIS — E785 Hyperlipidemia, unspecified: Secondary | ICD-10-CM | POA: Diagnosis not present

## 2020-06-30 DIAGNOSIS — C61 Malignant neoplasm of prostate: Secondary | ICD-10-CM | POA: Diagnosis not present

## 2020-06-30 DIAGNOSIS — R2681 Unsteadiness on feet: Secondary | ICD-10-CM

## 2020-06-30 DIAGNOSIS — Z0000 Encounter for general adult medical examination without abnormal findings: Secondary | ICD-10-CM

## 2020-06-30 DIAGNOSIS — I10 Essential (primary) hypertension: Secondary | ICD-10-CM | POA: Diagnosis not present

## 2020-06-30 LAB — CBC WITH DIFFERENTIAL/PLATELET
Basophils Absolute: 0.1 10*3/uL (ref 0.0–0.1)
Basophils Relative: 1 % (ref 0.0–3.0)
Eosinophils Absolute: 0.1 10*3/uL (ref 0.0–0.7)
Eosinophils Relative: 1.4 % (ref 0.0–5.0)
HCT: 41.3 % (ref 39.0–52.0)
Hemoglobin: 14.4 g/dL (ref 13.0–17.0)
Lymphocytes Relative: 16.8 % (ref 12.0–46.0)
Lymphs Abs: 1 10*3/uL (ref 0.7–4.0)
MCHC: 34.9 g/dL (ref 30.0–36.0)
MCV: 86.2 fl (ref 78.0–100.0)
Monocytes Absolute: 0.4 10*3/uL (ref 0.1–1.0)
Monocytes Relative: 7.1 % (ref 3.0–12.0)
Neutro Abs: 4.3 10*3/uL (ref 1.4–7.7)
Neutrophils Relative %: 73.7 % (ref 43.0–77.0)
Platelets: 140 10*3/uL — ABNORMAL LOW (ref 150.0–400.0)
RBC: 4.79 Mil/uL (ref 4.22–5.81)
RDW: 13.7 % (ref 11.5–15.5)
WBC: 5.8 10*3/uL (ref 4.0–10.5)

## 2020-06-30 LAB — COMPREHENSIVE METABOLIC PANEL
ALT: 11 U/L (ref 0–53)
AST: 15 U/L (ref 0–37)
Albumin: 3.7 g/dL (ref 3.5–5.2)
Alkaline Phosphatase: 63 U/L (ref 39–117)
BUN: 18 mg/dL (ref 6–23)
CO2: 31 mEq/L (ref 19–32)
Calcium: 9.1 mg/dL (ref 8.4–10.5)
Chloride: 104 mEq/L (ref 96–112)
Creatinine, Ser: 1.01 mg/dL (ref 0.40–1.50)
GFR: 71.94 mL/min (ref >60.00)
Glucose, Bld: 122 mg/dL — ABNORMAL HIGH (ref 70–99)
Potassium: 3.6 mEq/L (ref 3.5–5.1)
Sodium: 139 mEq/L (ref 135–145)
Total Bilirubin: 1.3 mg/dL — ABNORMAL HIGH (ref 0.2–1.2)
Total Protein: 6.2 g/dL (ref 6.0–8.3)

## 2020-06-30 LAB — LIPID PANEL
Cholesterol: 93 mg/dL (ref 0–200)
HDL: 39.5 mg/dL (ref >39.00)
LDL Cholesterol: 44 mg/dL (ref 0–99)
NonHDL: 53.51
Total CHOL/HDL Ratio: 2
Triglycerides: 48 mg/dL (ref 0.0–149.0)
VLDL: 9.6 mg/dL (ref 0.0–40.0)

## 2020-06-30 LAB — TSH: TSH: 1.44 u[IU]/mL (ref 0.35–4.50)

## 2020-06-30 LAB — PSA: PSA: 1.34 ng/mL (ref 0.10–4.00)

## 2020-06-30 LAB — HEMOGLOBIN A1C: Hgb A1c MFr Bld: 6.5 % (ref 4.6–6.5)

## 2020-06-30 NOTE — Patient Instructions (Signed)
It was great seeing you today!   We will follow up with you regarding your blood work   Someone from PT will call you to schedule your appointment   Please let me know if you need anything   I will see you back in one year

## 2020-06-30 NOTE — Progress Notes (Signed)
Subjective:    Patient ID: Brandon [name], male    DOB: Jul 25, 1945, 75 y.o.   MRN: [id number]  HPI Patient presents for yearly preventative medicine examination. He is a pleasant 75 year old male who  has a past medical history of Allergy, Basal cell carcinoma, GERD (gastroesophageal reflux disease), History of kidney cancer, and Hypertension.  Essential Hypertension -currently prescribed lisinopril/hydrochlorothiazide 20-25 mg.  He is very well controlled on this medication and denies episodes of lightheadedness, dizziness, headaches, blurred vision, or syncopal episodes.  Hyperlipidemia-takes Lipitor.  He denies myalgia or fatigue  H/O prostate Cancer -currently managed by urology.  On Proscar and Flomax.  Is seen every 6 months.  There is no active treatment due to slow-growing nature of his cancer.  Gait Instability -longstanding issue.  Denies dizziness or lightheadedness just feels as though his gait is unsteady and that he is off balance from time to time.  Is interested currently in doing physical therapy.  He did have one fall over the last year when he was working outside, fell into a large [place] plantar. Denies serious injury with fall.   All immunizations and health maintenance protocols were reviewed with the patient and needed orders were placed.  Appropriate screening laboratory values were ordered for the patient including screening of hyperlipidemia, renal function and hepatic function. If indicated by BPH, a PSA was ordered.  Medication reconciliation,  past medical history, social history, problem list and allergies were reviewed in detail with the patient  Goals were established with regard to weight loss, exercise, and  diet in compliance with medications  End of life planning was discussed.   Review of Systems  Constitutional: Negative.   HENT: Positive for hearing loss.   Eyes: Negative.   Respiratory: Negative.   Cardiovascular: Negative.    Gastrointestinal: Negative.   Endocrine: Negative.   Genitourinary: Negative.   Musculoskeletal: Positive for gait problem.  Skin: Negative.   Allergic/Immunologic: Negative.   Hematological: Negative.   Psychiatric/Behavioral: Negative.   All other systems reviewed and are negative.  Past Medical History:  Diagnosis Date  . Allergy   . Basal cell carcinoma   . GERD (gastroesophageal reflux disease)   . History of kidney cancer    benign; told not cancerous  . Hypertension     Social History   Socioeconomic History  . Marital status: Married    Spouse name: Not on file  . Number of children: Not on file  . Years of education: Not on file  . Highest education level: Not on file  Occupational History  . Not on file  Tobacco Use  . Smoking status: Former Research scientist (life sciences)  . Smokeless tobacco: Never Used  Substance and Sexual Activity  . Alcohol use: Yes    Alcohol/week: 0.0 standard drinks    Comment: "Beer every once in a while"  . Drug use: No  . Sexual activity: Not on file  Other Topics Concern  . Not on file  Social History Narrative   Retired from [employer]   Married    Three children (Two in [place] and one in [place])    Social Determinants of Health   Financial Resource Strain: Not on file  Food Insecurity: Not on file  Transportation Needs: Not on file  Physical Activity: Not on file  Stress: Not on file  Social Connections: Not on file  Intimate Partner Violence: Not on file    Past Surgical History:  Procedure Laterality Date  .  CATARACT EXTRACTION Bilateral   . HERNIA REPAIR     x2  . KIDNEY SURGERY Right    Had right kidney removed.   . TONSILLECTOMY    . VASECTOMY      Family History  Problem Relation Age of Onset  . Cancer Mother        Breast   . Cancer Father        Lung   . Colon cancer Neg Hx     No Known Allergies  Current Outpatient Medications on File Prior to Visit  Medication Sig Dispense Refill  . atorvastatin (LIPITOR) 20  MG tablet TAKE 1 TABLET EVERY DAY 90 tablet 1  . cholecalciferol (VITAMIN D) 1000 units tablet Take 1,000 Units by mouth daily.    . Cyanocobalamin (VITAMIN B-12 PO)     . finasteride (PROSCAR) 5 MG tablet     . FLUZONE HIGH-DOSE QUADRIVALENT 0.7 ML SUSY     . lisinopril-hydrochlorothiazide (ZESTORETIC) 20-25 MG tablet TAKE 1 TABLET EVERY DAY 90 tablet 1  . SHINGRIX injection     . tamsulosin (FLOMAX) 0.4 MG CAPS capsule Take 1 capsule (0.4 mg total) by mouth daily. (Patient taking differently: Take 0.4 mg by mouth 2 (two) times daily.) 90 capsule 2  . vitamin C (ASCORBIC ACID) 500 MG tablet      No current facility-administered medications on file prior to visit.    There were no vitals taken for this visit.      Objective:   Physical Exam Vitals and nursing note reviewed.  Constitutional:      General: He is not in acute distress.    Appearance: Normal appearance. He is well-developed and normal weight.  HENT:     Head: Normocephalic and atraumatic.     Right Ear: Tympanic membrane, ear canal and external ear normal. There is no impacted cerumen.     Left Ear: Tympanic membrane, ear canal and external ear normal. There is no impacted cerumen.     Nose: Nose normal. No congestion or rhinorrhea.     Mouth/Throat:     Mouth: Mucous membranes are moist.     Pharynx: Oropharynx is clear. No oropharyngeal exudate or posterior oropharyngeal erythema.  Eyes:     General:        Right eye: No discharge.        Left eye: No discharge.     Extraocular Movements: Extraocular movements intact.     Conjunctiva/sclera: Conjunctivae normal.     Pupils: Pupils are equal, round, and reactive to light.  Neck:     Vascular: No carotid bruit.     Trachea: No tracheal deviation.  Cardiovascular:     Rate and Rhythm: Normal rate and regular rhythm.     Pulses: Normal pulses.     Heart sounds: Murmur (heard best at [facility]) heard.   Systolic murmur is present with a grade of 2/6. No friction rub.  No gallop.   Pulmonary:     Effort: Pulmonary effort is normal. No respiratory distress.     Breath sounds: Normal breath sounds. No stridor. No wheezing, rhonchi or rales.  Chest:     Chest wall: No tenderness.  Abdominal:     General: Bowel sounds are normal. There is no distension.     Palpations: Abdomen is soft. There is no mass.     Tenderness: There is no abdominal tenderness. There is no right CVA tenderness, left CVA tenderness, guarding or rebound.     Hernia:  No hernia is present.  Musculoskeletal:        General: No swelling, tenderness, deformity or signs of injury. Normal range of motion.     Right lower leg: No edema.     Left lower leg: No edema.  Lymphadenopathy:     Cervical: No cervical adenopathy.  Skin:    General: Skin is warm and dry.     Capillary Refill: Capillary refill takes less than 2 seconds.     Coloration: Skin is not jaundiced or pale.     Findings: No bruising, erythema, lesion or rash.  Neurological:     General: No focal deficit present.     Mental Status: He is alert and oriented to person, place, and time.     Cranial Nerves: No cranial nerve deficit.     Sensory: No sensory deficit.     Motor: No weakness.     Coordination: Coordination normal.     Gait: Gait normal.     Deep Tendon Reflexes: Reflexes normal.  Psychiatric:        Mood and Affect: Mood normal.        Behavior: Behavior normal.        Thought Content: Thought content normal.        Judgment: Judgment normal.        Assessment & Plan:  1. Routine general medical examination at a health care facility - Start walking more often  - Follow up in one year or sooner if needed - CBC with Differential/Platelet; Future - Comprehensive metabolic panel; Future - Hemoglobin A1c; Future - Lipid panel; Future - TSH; Future - TSH - Lipid panel - Hemoglobin A1c - Comprehensive metabolic panel - CBC with Differential/Platelet  2. Prostate cancer ([place]) - Follow up with Urology  as directed - PSA; Future - PSA  3. Essential hypertension - Well controlled. No change in medications  - CBC with Differential/Platelet; Future - Comprehensive metabolic panel; Future - Hemoglobin A1c; Future - Lipid panel; Future - TSH; Future - TSH - Lipid panel - Hemoglobin A1c - Comprehensive metabolic panel - CBC with Differential/Platelet  4. Hyperlipidemia, unspecified hyperlipidemia type - Consider increase in statin  - CBC with Differential/Platelet; Future - Comprehensive metabolic panel; Future - Hemoglobin A1c; Future - Lipid panel; Future - TSH; Future - TSH - Lipid panel - Hemoglobin A1c - Comprehensive metabolic panel - CBC with Differential/Platelet  5. Gait instability  - Ambulatory referral to Physical Therapy  Dorothyann Peng, NP

## 2020-07-05 ENCOUNTER — Encounter: Payer: Self-pay | Admitting: Adult Health

## 2020-07-13 ENCOUNTER — Ambulatory Visit (INDEPENDENT_AMBULATORY_CARE_PROVIDER_SITE_OTHER): Payer: Medicare HMO | Admitting: Adult Health

## 2020-07-13 ENCOUNTER — Other Ambulatory Visit: Payer: Self-pay

## 2020-07-13 VITALS — BP 124/68 | HR 75 | Temp 97.7°F | Ht 79.0 in | Wt 254.2 lb

## 2020-07-13 DIAGNOSIS — H6123 Impacted cerumen, bilateral: Secondary | ICD-10-CM | POA: Diagnosis not present

## 2020-07-13 NOTE — Progress Notes (Signed)
Subjective:    Patient ID: Brandon [name], male    DOB: [birth year], 75 y.o.   MRN: [id number]  HPI 75 year old male who  has a past medical history of Allergy, Basal cell carcinoma, GERD (gastroesophageal reflux disease), History of kidney cancer, and Hypertension.  He presents to the office today for bilateral cerumen impaction.  Went to [facility] earlier for audiology exam in order to get hearing aids and was told that he had too much earwax in both of his ear canals and needed to have the ears cleaned out.  He denies pain.  Does have loss of hearing but nothing worsening.   Review of Systems See HPI   Past Medical History:  Diagnosis Date  . Allergy   . Basal cell carcinoma   . GERD (gastroesophageal reflux disease)   . History of kidney cancer    benign; told not cancerous  . Hypertension     Social History   Socioeconomic History  . Marital status: Married    Spouse name: Not on file  . Number of children: Not on file  . Years of education: Not on file  . Highest education level: Not on file  Occupational History  . Not on file  Tobacco Use  . Smoking status: Former Research scientist (life sciences)  . Smokeless tobacco: Never Used  Substance and Sexual Activity  . Alcohol use: Yes    Alcohol/week: 0.0 standard drinks    Comment: "Beer every once in a while"  . Drug use: No  . Sexual activity: Not on file  Other Topics Concern  . Not on file  Social History Narrative   Retired from [employer]   Married    Three children (Two in [place] and one in [place])    Social Determinants of Health   Financial Resource Strain: Not on file  Food Insecurity: Not on file  Transportation Needs: Not on file  Physical Activity: Not on file  Stress: Not on file  Social Connections: Not on file  Intimate Partner Violence: Not on file    Past Surgical History:  Procedure Laterality Date  . CATARACT EXTRACTION Bilateral   . HERNIA REPAIR     x2  . KIDNEY SURGERY Right    Had right kidney  removed.   . TONSILLECTOMY    . VASECTOMY      Family History  Problem Relation Age of Onset  . Cancer Mother        Breast   . Cancer Father        Lung   . Colon cancer Neg Hx     No Known Allergies  Current Outpatient Medications on File Prior to Visit  Medication Sig Dispense Refill  . atorvastatin (LIPITOR) 20 MG tablet TAKE 1 TABLET EVERY DAY 90 tablet 1  . b complex vitamins capsule Take 1 capsule by mouth daily.    . cholecalciferol (VITAMIN D) 1000 units tablet Take 1,000 Units by mouth daily.    . finasteride (PROSCAR) 5 MG tablet     . FLUZONE HIGH-DOSE QUADRIVALENT 0.7 ML SUSY     . lisinopril-hydrochlorothiazide (ZESTORETIC) 20-25 MG tablet TAKE 1 TABLET EVERY DAY 90 tablet 1  . SHINGRIX injection     . tamsulosin (FLOMAX) 0.4 MG CAPS capsule Take 1 capsule (0.4 mg total) by mouth daily. (Patient taking differently: Take 0.4 mg by mouth 2 (two) times daily.) 90 capsule 2  . vitamin C (ASCORBIC ACID) 500 MG tablet  No current facility-administered medications on file prior to visit.    BP 124/68   Pulse 75   Temp 97.7 F (36.5 C) (Oral)   Ht 6\' 7"  (2.007 m)   Wt 254 lb 3.2 oz (115.3 kg)   SpO2 96%   BMI 28.64 kg/m       Objective:   Physical Exam Vitals and nursing note reviewed.  Constitutional:      Appearance: Normal appearance.  HENT:     Right Ear: There is impacted cerumen.     Left Ear: There is impacted cerumen.  Neurological:     General: No focal deficit present.     Mental Status: He is alert and oriented to person, place, and time.  Psychiatric:        Mood and Affect: Mood normal.        Behavior: Behavior normal.        Thought Content: Thought content normal.        Judgment: Judgment normal.       Assessment & Plan:  1. Bilateral impacted cerumen - Verbal consent obtained Warm water was applied and gentle ear lavage performed to bilaterally ears. There were no complications and following the disimpaction the tympanic  membrane were visible bilaterally. Tympanic membranes are intact following the procedure.  Auditory canals are normal.  The patient reported relief of symptoms after removal of cerumen. Patient tolerated procedure well.   Dorothyann Peng, NP

## 2020-07-14 ENCOUNTER — Ambulatory Visit: Payer: Medicare HMO | Attending: Adult Health | Admitting: Physical Therapy

## 2020-07-14 ENCOUNTER — Other Ambulatory Visit: Payer: Self-pay

## 2020-07-14 ENCOUNTER — Encounter: Payer: Self-pay | Admitting: Physical Therapy

## 2020-07-14 DIAGNOSIS — R2681 Unsteadiness on feet: Secondary | ICD-10-CM | POA: Diagnosis not present

## 2020-07-14 NOTE — Therapy (Signed)
[facility] [address], [place], [place], [address] Phone: [phone]   Fax:  [phone]  Physical Therapy Evaluation  Patient Details  Name: [name] MRN: [id number] Date of Birth: 03-23-1946 Referring Provider (PT): Dorothyann Peng, NP   Encounter Date: 07/14/2020   PT End of Session - 07/14/20 1306    Visit Number 1    Number of Visits 12    Date for PT Re-Evaluation 09/08/20    Authorization Type HUMANA MCR    Authorization Time Period 07/14/20 to 09/08/20    Authorization - Visit Number 1    Authorization - Number of Visits 12    Progress Note Due on Visit 10    PT Start Time 0849    PT Stop Time 0933    PT Time Calculation (min) 44 min    Activity Tolerance Patient tolerated treatment well    Behavior During Therapy [facility] for tasks assessed/performed           Past Medical History:  Diagnosis Date  . Allergy   . Basal cell carcinoma   . GERD (gastroesophageal reflux disease)   . History of kidney cancer    benign; told not cancerous  . Hypertension     Past Surgical History:  Procedure Laterality Date  . CATARACT EXTRACTION Bilateral   . HERNIA REPAIR     x2  . KIDNEY SURGERY Right    Had right kidney removed.   . TONSILLECTOMY    . VASECTOMY      There were no vitals filed for this visit.    Subjective Assessment - 07/14/20 1234    Subjective Patient reports he is having balance issues with walking. He has had one fall in Dec 2021 in the yard. He also has difficulty with sit to stand and with getting up from the ground or floor.    Patient is accompained by: Family member   wife   Pertinent History HTN, mild dementia, HOH, prostate CA (mild), restless leg syndrome    Patient Stated Goals to improve balance    Currently in Pain? No/denies              [facility] PT Assessment - 07/14/20 0001      Assessment   Medical Diagnosis gait instability    Referring Provider (PT) Dorothyann Peng, NP    Onset Date/Surgical Date 04/21/20    Hand Dominance Right      Precautions   Precautions Fall      Restrictions   Weight Bearing Restrictions No      Balance Screen   Has the patient fallen in the past 6 months Yes    How many times? 1    Has the patient had a decrease in activity level because of a fear of falling?  No    Is the patient reluctant to leave their home because of a fear of falling?  No      Home Ecologist residence    Living Arrangements Spouse/significant other    Additional Comments one level      Prior Function   Level of [place] Retired    Leisure walking 1 mile per day      Posture/Postural Control   Posture Comments right hip in ER in standing      ROM / Strength   AROM / PROM / Strength Strength;AROM      AROM  Overall AROM Comments Bil cervical rotation decreased 60% and SB decreased 75%      Strength   Overall Strength Comments hip ABD/ADD tested in sitting    Strength Assessment Site Hip;Knee    Right/Left Hip Right;Left    Right Hip Flexion 3+/5    Right Hip ABduction 5/5    Right Hip ADduction 4+/5    Left Hip Flexion 4-/5    Left Hip ABduction 5/5    Left Hip ADduction 4+/5    Right/Left Knee Right;Left    Right Knee Flexion 5/5    Right Knee Extension 4+/5    Left Knee Flexion 5/5    Left Knee Extension 5/5      Flexibility   Soft Tissue Assessment /Muscle Length --   marked HS tightness bil     Transfers   Five time sit to stand comments  12.91 sec with one UE assist from chair; 10 sec from hi/lo table at 90/90 hip/knee flex with no UE assist      Ambulation/Gait   Ambulation/Gait Yes    Ambulation/Gait Assistance 7: Independent    Ambulation Distance (Feet) 40 Feet    Assistive device None    Gait Pattern Step-through pattern;Scissoring;Narrow base of support    Ambulation Surface Level    Gait Comments narrow BOS, intermittent scissoring of left  LE      Standardized Balance Assessment   Standardized Balance Assessment Berg Balance Test;Timed Up and Go Test;Dynamic Gait Index      Berg Balance Test   Sit to Stand Able to stand  independently using hands    Standing Unsupported Able to stand safely 2 minutes    Sitting with Back Unsupported but Feet Supported on Floor or Stool Able to sit safely and securely 2 minutes    Stand to Sit Sits safely with minimal use of hands    Transfers Able to transfer safely, minor use of hands    Standing Unsupported with Eyes Closed Able to stand 10 seconds safely    Standing Unsupported with Feet Together Able to place feet together independently and stand 1 minute safely    From Standing, Reach Forward with Outstretched Arm Can reach confidently >25 cm (10")    From Standing Position, Pick up Object from Floor Able to pick up shoe safely and easily    From Standing Position, Turn to Look Behind Over each Shoulder Looks behind from both sides and weight shifts well    Turn 360 Degrees Able to turn 360 degrees safely in 4 seconds or less    Standing Unsupported, Alternately Place Feet on Step/Stool Able to stand independently and safely and complete 8 steps in 20 seconds    Standing Unsupported, One Foot in Front Able to place foot tandem independently and hold 30 seconds    Standing on One Leg Tries to lift leg/unable to hold 3 seconds but remains standing independently    Total Score 52      Dynamic Gait Index   Level Surface Mild Impairment    Change in Gait Speed Normal    Gait with Horizontal Head Turns Moderate Impairment    Gait with Vertical Head Turns Normal    Gait and Pivot Turn Normal    Step Over Obstacle Normal    Step Around Obstacles Normal    Steps Normal    Total Score 21      Timed Up and Go Test   Normal TUG (seconds) 9.67  Objective measurements completed on examination: See above findings.               PT Education -  07/14/20 0934    Education Details HEP    Person(s) Educated Patient;Spouse    Methods Explanation;Demonstration;Handout    Comprehension Verbalized understanding;Returned demonstration            PT Short Term Goals - 07/14/20 1308      PT SHORT TERM GOAL #1   Title Ind with initial HEP    Time 3    Period Weeks    Status New    Target Date 08/04/20             PT Long Term Goals - 07/14/20 1309      PT LONG TERM GOAL #1   Title Ind with advanced HEP    Time 8    Period Weeks    Status New    Target Date 09/08/20      PT LONG TERM GOAL #2   Title Improved DGI to 23/24 to decrease fall risk    Baseline 21/24    Time 8    Period Weeks    Status New      PT LONG TERM GOAL #3   Title Increased SLS time to >= 5 sec bil to decrease fall risk.    Time 8    Period Weeks    Status New      PT LONG TERM GOAL #4   Title Improved sit to stand from chair to <13 sec with no UE assist.    Time 8    Period Weeks    Status New      PT LONG TERM GOAL #5   Title Patient to demo improved gait pattern without scissoring and with normal BOS    Time 8    Period Weeks    Status New                  Plan - 07/14/20 1145    Clinical Impression Statement Patient presents with balance concerns x 1 year. He states that it has gotten worse in the past few months. He also reports of difficulties with sit to stand transfers and ground to stand transfers. Patient is 6 foot 6 in tall. He has had one fall in the yard in Dec 2021 and was unable to get up for a long period of time. His 5x sit to stand is 12.91 sec from the chair with one UE assist indicating he is a fall risk, although norm for his age is 12.6 sec. From the hi/lo table set at 90 degree hip/knee flex his sit to stand was 10 sec with no UE assist. Generally his unsteadiness comes from sit to stand from his usual chair from which he turns immediately left. His BERG score is 52/56, TUG 9.67 sec which is just shy of the  norm for his age, DGI is 45/24 with greatest deficit coming with head turns. Patient has significant cervical rotation and SB deficits. Patient also has a narrow BOS with gait and demonstrates intermittent scissoring of the Lt LE. His SLS is <3 sec on the right and 1 sec on the left. He also has strength deficits in BLE and marked HS tightness. He will benefit from skilled PT to address these deficits and prevent further falls.    Personal Factors and Comorbidities Comorbidity 3+    Comorbidities HTN, mild dementia, HOH, prostate CA (mild), restless  leg syndrome    Stability/Clinical Decision Making Stable/Uncomplicated    Clinical Decision Making Low    Rehab Potential Excellent    PT Frequency 2x / week    PT Duration 8 weeks    PT Treatment/Interventions ADLs/Self Care Home Management;Aquatic Therapy;Gait training;Therapeutic activities;Therapeutic exercise;Balance training;Neuromuscular re-education;Patient/family education;Manual techniques    PT Next Visit Plan Balance focusing on head turns, wider BOS, scissoring; sit to stand, neck ROM    PT Home Exercise Plan [email or web address]    Consulted and Agree with Plan of Care Patient           Patient will benefit from skilled therapeutic intervention in order to improve the following deficits and impairments:  Abnormal gait,Decreased balance,Decreased strength,Impaired flexibility  Visit Diagnosis: Unsteadiness on feet - Plan: PT plan of care cert/re-cert     Problem List Patient Active Problem List   Diagnosis Date Noted  . Hyperlipidemia 02/13/2018  . BPH (benign prostatic hyperplasia) 01/31/2017  . Heart murmur 01/20/2016  . HTN (hypertension) 03/09/2012   Madelyn Flavors PT 07/14/2020, 1:15 PM  [facility] [address] [place], [place], [address] Phone: [phone]   Fax:  [phone]  Name: [name] MRN: [id number] Date of Birth: 06-Jun-1945

## 2020-07-14 NOTE — Patient Instructions (Signed)
Access Code: [email or web address] URL: [email or web address] Date: 07/14/2020 Prepared by: Almyra Free  Exercises Seated Cervical Rotation AROM - 2 x daily - 7 x weekly - 1 sets - 5 reps - 5 sec hold Seated Cervical Sidebending AROM - 2 x daily - 7 x weekly - 1 sets - 5 reps - 5 sec hold

## 2020-07-18 ENCOUNTER — Other Ambulatory Visit: Payer: Self-pay

## 2020-07-18 ENCOUNTER — Ambulatory Visit: Payer: Medicare HMO | Admitting: Physical Therapy

## 2020-07-18 ENCOUNTER — Encounter: Payer: Self-pay | Admitting: Physical Therapy

## 2020-07-18 DIAGNOSIS — R2681 Unsteadiness on feet: Secondary | ICD-10-CM

## 2020-07-18 NOTE — Therapy (Signed)
[facility] [address] [place], [place], [address] Phone: [phone]   Fax:  [phone]  Physical Therapy Treatment  Patient Details  Name: [name] MRN: [id number] Date of Birth: 26-Oct-1945 Referring Provider (PT): Dorothyann Peng, NP   Encounter Date: 07/18/2020   PT End of Session - 07/18/20 1449    Visit Number 2    Number of Visits 12    Date for PT Re-Evaluation 09/08/20    Authorization Type HUMANA MCR    Authorization Time Period 07/14/20 to 09/08/20    Authorization - Visit Number 2    Authorization - Number of Visits 12    Progress Note Due on Visit 10    PT Start Time 1448   pt got super fatigued   PT Stop Time 1522    PT Time Calculation (min) 34 min    Activity Tolerance Patient tolerated treatment well;Patient limited by fatigue    Behavior During Therapy [facility] for tasks assessed/performed           Past Medical History:  Diagnosis Date  . Allergy   . Basal cell carcinoma   . GERD (gastroesophageal reflux disease)   . History of kidney cancer    benign; told not cancerous  . Hypertension     Past Surgical History:  Procedure Laterality Date  . CATARACT EXTRACTION Bilateral   . HERNIA REPAIR     x2  . KIDNEY SURGERY Right    Had right kidney removed.   . TONSILLECTOMY    . VASECTOMY      There were no vitals filed for this visit.   Subjective Assessment - 07/18/20 1451    Subjective Doing ok today, no new complaints    Currently in Pain? No/denies    Multiple Pain Sites No                             [facility] Adult PT Treatment/Exercise - 07/18/20 0001      Neuro Re-ed    Neuro Re-ed Details  Walking with head turns 4x,      Knee/Hip Exercises: Machines for Strengthening   Cybex Leg Press Seat 9 BilLE 60# 2x10      Knee/Hip Exercises: Standing   Hip Flexion --   20x light UE, 20x no UE, added to HEp   Hip Flexion Limitations Rt hip hike and increased  knee flexion    Hip Abduction Stengthening;Both;1 set;10 reps;Knee straight      Knee/Hip Exercises: Seated   Long Arc Quad Strengthening;Both;1 set;10 reps;Weights    Long Arc Quad Weight 3 lbs.    Clamshell with TheraBand Yellow   loop 20x   Sit to Sand --   with black pad: 10x, tehn VC to increase BOS 10x                 PT Education - 07/18/20 1454    Education Details HEP    Person(s) Educated Patient    Methods Explanation;Demonstration;Verbal cues;Handout    Comprehension Returned demonstration;Verbalized understanding            PT Short Term Goals - 07/14/20 1308      PT SHORT TERM GOAL #1   Title Ind with initial HEP    Time 3    Period Weeks    Status New    Target Date 08/04/20  PT Long Term Goals - 07/14/20 1309      PT LONG TERM GOAL #1   Title Ind with advanced HEP    Time 8    Period Weeks    Status New    Target Date 09/08/20      PT LONG TERM GOAL #2   Title Improved DGI to 23/24 to decrease fall risk    Baseline 21/24    Time 8    Period Weeks    Status New      PT LONG TERM GOAL #3   Title Increased SLS time to >= 5 sec bil to decrease fall risk.    Time 8    Period Weeks    Status New      PT LONG TERM GOAL #4   Title Improved sit to stand from chair to <13 sec with no UE assist.    Time 8    Period Weeks    Status New      PT LONG TERM GOAL #5   Title Patient to demo improved gait pattern without scissoring and with normal BOS    Time 8    Period Weeks    Status New                 Plan - 07/18/20 1450    Clinical Impression Statement Pt arrives with reports of compliance with initial HEP. pt was able to perform sit to stand from standard chair with black pad due to pt's height wihtout UE. This was very challenging to his quad strength, in fact probably contributed to his overall fatigue that ended session. Pt's Rt hip demonstrates weakness in most exercises today. HEp progressed to add sit to  stand and standing marching.    Personal Factors and Comorbidities Comorbidity 3+    Comorbidities HTN, mild dementia, HOH, prostate CA (mild), restless leg syndrome    Stability/Clinical Decision Making Stable/Uncomplicated    Rehab Potential Excellent    PT Frequency 2x / week    PT Duration 8 weeks    PT Treatment/Interventions ADLs/Self Care Home Management;Aquatic Therapy;Gait training;Therapeutic activities;Therapeutic exercise;Balance training;Neuromuscular re-education;Patient/family education;Manual techniques    PT Next Visit Plan Balance focusing on head turns, wider BOS, scissoring; sit to stand, neck ROM    PT Home Exercise Plan [email or web address]    Consulted and Agree with Plan of Care Patient           Patient will benefit from skilled therapeutic intervention in order to improve the following deficits and impairments:  Abnormal gait,Decreased balance,Decreased strength,Impaired flexibility  Visit Diagnosis: Unsteadiness on feet     Problem List Patient Active Problem List   Diagnosis Date Noted  . Hyperlipidemia 02/13/2018  . BPH (benign prostatic hyperplasia) 01/31/2017  . Heart murmur 01/20/2016  . HTN (hypertension) 03/09/2012    Glendon Fiser, PTA 07/18/2020, 3:25 PM  [facility] [address], [place], [place], [address] Phone: [phone]   Fax:  [phone]  Name: ASHTEN PRATS MRN: [id number] Date of Birth: 05/04/1946

## 2020-07-20 ENCOUNTER — Encounter: Payer: Self-pay | Admitting: Physical Therapy

## 2020-07-20 ENCOUNTER — Ambulatory Visit: Payer: Medicare HMO | Admitting: Physical Therapy

## 2020-07-20 ENCOUNTER — Other Ambulatory Visit: Payer: Self-pay

## 2020-07-20 DIAGNOSIS — R2681 Unsteadiness on feet: Secondary | ICD-10-CM | POA: Diagnosis not present

## 2020-07-20 NOTE — Therapy (Signed)
[facility] [address], [name], [place], [address] Phone: [phone]   Fax:  [phone]  Physical Therapy Treatment  Patient Details  Name: [name] MRN: [id number] Date of Birth: February 16, 1946 Referring Provider (PT): Dorothyann Peng, NP   Encounter Date: 07/20/2020   PT End of Session - 07/20/20 1447    Visit Number 3    Number of Visits 12    Date for PT Re-Evaluation 09/08/20    Authorization Type HUMANA MCR    Authorization Time Period 07/14/20 to 09/08/20    Authorization - Visit Number 3    Authorization - Number of Visits 12    Progress Note Due on Visit 10    PT Start Time 1245    Activity Tolerance Patient tolerated treatment well;Patient limited by fatigue    Behavior During Therapy [facility] for tasks assessed/performed           Past Medical History:  Diagnosis Date  . Allergy   . Basal cell carcinoma   . GERD (gastroesophageal reflux disease)   . History of kidney cancer    benign; told not cancerous  . Hypertension     Past Surgical History:  Procedure Laterality Date  . CATARACT EXTRACTION Bilateral   . HERNIA REPAIR     x2  . KIDNEY SURGERY Right    Had right kidney removed.   . TONSILLECTOMY    . VASECTOMY      There were no vitals filed for this visit.   Subjective Assessment - 07/20/20 1448    Subjective My quads were sore after last sesison    Pertinent History HTN, mild dementia, HOH, prostate CA (mild), restless leg syndrome    Currently in Pain? No/denies    Multiple Pain Sites No                             [facility] Adult PT Treatment/Exercise - 07/20/20 0001      Knee/Hip Exercises: Aerobic   Nustep L2 x 10 min with PTA present      Knee/Hip Exercises: Machines for Strengthening   Cybex Leg Press Seat 9 BilLE 60# 2x10      Knee/Hip Exercises: Standing   Hip Abduction Stengthening;Both;1 set;10 reps;Knee straight    Abduction Limitations impaired R  LE stability    SLS with Vectors SLS with contralateral fwd and lat step x10 bil LE      Knee/Hip Exercises: Seated   Long Arc Quad Strengthening;Both;1 set;10 reps;Weights    Long Arc Quad Weight 3 lbs.    Clamshell with TheraBand Red   loop; x20   Sit to Sand 1 set;10 reps;without UE support   black foam in chair; VC for increased based of support                   PT Short Term Goals - 07/14/20 1308      PT SHORT TERM GOAL #1   Title Ind with initial HEP    Time 3    Period Weeks    Status New    Target Date 08/04/20             PT Long Term Goals - 07/14/20 1309      PT LONG TERM GOAL #1   Title Ind with advanced HEP    Time 8    Period Weeks    Status New    Target Date  09/08/20      PT LONG TERM GOAL #2   Title Improved DGI to 23/24 to decrease fall risk    Baseline 21/24    Time 8    Period Weeks    Status New      PT LONG TERM GOAL #3   Title Increased SLS time to >= 5 sec bil to decrease fall risk.    Time 8    Period Weeks    Status New      PT LONG TERM GOAL #4   Title Improved sit to stand from chair to <13 sec with no UE assist.    Time 8    Period Weeks    Status New      PT LONG TERM GOAL #5   Title Patient to demo improved gait pattern without scissoring and with normal BOS    Time 8    Period Weeks    Status New                  Patient will benefit from skilled therapeutic intervention in order to improve the following deficits and impairments:     Visit Diagnosis: Unsteadiness on feet     Problem List Patient Active Problem List   Diagnosis Date Noted  . Hyperlipidemia 02/13/2018  . BPH (benign prostatic hyperplasia) 01/31/2017  . Heart murmur 01/20/2016  . HTN (hypertension) 03/09/2012    [name] 07/20/2020, 3:22 PM  [facility] [address], [place], [place], [address] Phone: [phone]   Fax:  [phone]  Name: Brandon [name] MRN: [id number] Date of Birth: 10/17/1945  Access Code: [email or web address]: [email or web address]: 03/09/2022Prepared by: Anderson Malta CochranExercises  Seated Cervical Rotation AROM - 2 x daily - 7 x weekly - 1 sets - 5 reps - 5 sec hold  Seated Cervical Sidebending AROM - 2 x daily - 7 x weekly - 1 sets - 5 reps - 5 sec hold  Standing Hip Flexion March - 2 x daily - 7 x weekly - 2 sets - 20 reps  Sit to Stand with Armchair - 1 x daily - 7 x weekly - 1 sets - 10 reps  Standing Single Leg Stance with Counter Support - 1 x daily - 7 x weekly - 1 sets - 5 reps - 10s hold

## 2020-07-20 NOTE — Therapy (Addendum)
[facility] [address], [place], [place], [address] Phone: [phone]   Fax:  [phone]  Physical Therapy Treatment  Patient Details  Name: Brandon [name] MRN: [id number] Date of Birth: 09-Dec-1945 Referring Provider (PT): Dorothyann Peng, NP   Encounter Date: 07/20/2020   PT End of Session - 07/20/20 1447    Visit Number 3    Number of Visits 12    Date for PT Re-Evaluation 09/08/20    Authorization Type HUMANA MCR    Authorization Time Period 07/14/20 to 09/08/20    Authorization - Visit Number 3    Authorization - Number of Visits 12    Progress Note Due on Visit 10    PT Start Time 9518    PT Stop Time 1528    PT Time Calculation (min) 43 min    Activity Tolerance Patient tolerated treatment well;Patient limited by fatigue    Behavior During Therapy [facility] for tasks assessed/performed           Past Medical History:  Diagnosis Date  . Allergy   . Basal cell carcinoma   . GERD (gastroesophageal reflux disease)   . History of kidney cancer    benign; told not cancerous  . Hypertension     Past Surgical History:  Procedure Laterality Date  . CATARACT EXTRACTION Bilateral   . HERNIA REPAIR     x2  . KIDNEY SURGERY Right    Had right kidney removed.   . TONSILLECTOMY    . VASECTOMY      There were no vitals filed for this visit.   Subjective Assessment - 07/20/20 1536    Subjective My quads were sore after last sesison    Patient is accompained by: Family member    Pertinent History HTN, mild dementia, HOH, prostate CA (mild), restless leg syndrome    Patient Stated Goals to improve balance    Currently in Pain? No/denies    Multiple Pain Sites No                             [facility] Adult PT Treatment/Exercise - 07/20/20 0001      Static Standing Balance   Single Leg Stance - Left Leg --   light hand hold x 10s; VC for glute and trunk activation for improved stability      Neuro Re-ed    Neuro Re-ed Details  Walking with head turns 4x,      Knee/Hip Exercises: Aerobic   Nustep L2 x 10 min with PTA present      Knee/Hip Exercises: Machines for Strengthening   Cybex Leg Press Seat 9 BilLE 60# x10; 70# x10      Knee/Hip Exercises: Standing   Hip Flexion --   x20s alternating stair taps; light UE hold; SBA for safety   Hip Abduction Stengthening;Both;1 set;10 reps;Knee straight    Abduction Limitations impaired R LE stability    SLS with Vectors SLS with contralateral fwd and lat step x10 bil LE    Other Standing Knee Exercises static stance w/ beach ball toss; 2 x 1 min   CGA for safety     Knee/Hip Exercises: Seated   Long Arc Quad Strengthening;Both;1 set;10 reps;Weights    Long Arc Quad Weight 3 lbs.    Clamshell with TheraBand Red   loop; x20   Sit to Sand 1 set;10 reps;without UE support   black foam in chair;  VC for increased based of support                 PT Education - 07/20/20 1537    Education Details supported SLS added to HEP    Person(s) Educated Patient    Methods Explanation;Handout;Demonstration    Comprehension Verbalized understanding;Returned demonstration            PT Short Term Goals - 07/14/20 1308      PT SHORT TERM GOAL #1   Title Ind with initial HEP    Time 3    Period Weeks    Status New    Target Date 08/04/20             PT Long Term Goals - 07/14/20 1309      PT LONG TERM GOAL #1   Title Ind with advanced HEP    Time 8    Period Weeks    Status New    Target Date 09/08/20      PT LONG TERM GOAL #2   Title Improved DGI to 23/24 to decrease fall risk    Baseline 21/24    Time 8    Period Weeks    Status New      PT LONG TERM GOAL #3   Title Increased SLS time to >= 5 sec bil to decrease fall risk.    Time 8    Period Weeks    Status New      PT LONG TERM GOAL #4   Title Improved sit to stand from chair to <13 sec with no UE assist.    Time 8    Period Weeks    Status New       PT LONG TERM GOAL #5   Title Patient to demo improved gait pattern without scissoring and with normal BOS    Time 8    Period Weeks    Status New                 Plan - 07/20/20 1538    Clinical Impression Statement Patient reports increased quad soreness following last session. No reports of pain today. Demonstrates continued intermittent scissoring gait pattern during ambulation which increases when ambulating with head turns. Requires CGA for dynamic standing activities and exhibits mildy delayed balance strategies. Would benefit from continued skilled intervention to address impairments for decreased fall risk and improved mobility.    Personal Factors and Comorbidities Comorbidity 3+    Comorbidities HTN, mild dementia, HOH, prostate CA (mild), restless leg syndrome    Rehab Potential Excellent    PT Frequency 2x / week    PT Duration 8 weeks    PT Treatment/Interventions ADLs/Self Care Home Management;Aquatic Therapy;Gait training;Therapeutic activities;Therapeutic exercise;Balance training;Neuromuscular re-education;Patient/family education;Manual techniques    PT Next Visit Plan Balance focusing on head turns, wider BOS, scissoring; sit to stand, neck ROM    PT Home Exercise Plan [email or web address]    Consulted and Agree with Plan of Care Patient           Patient will benefit from skilled therapeutic intervention in order to improve the following deficits and impairments:  Abnormal gait,Decreased balance,Decreased strength,Impaired flexibility  Visit Diagnosis: Unsteadiness on feet     Problem List Patient Active Problem List   Diagnosis Date Noted  . Hyperlipidemia 02/13/2018  . BPH (benign prostatic hyperplasia) 01/31/2017  . Heart murmur 01/20/2016  . HTN (hypertension) 03/09/2012   Everardo All PT, DPT  07/20/20 3:42 PM  [facility] [address], [place], [place], [address] Phone: [phone]    Fax:  [phone]  Name: Brandon [name] MRN: [id number] Date of Birth: 17-Nov-1945

## 2020-07-25 ENCOUNTER — Ambulatory Visit: Payer: Medicare HMO | Admitting: Physical Therapy

## 2020-07-25 ENCOUNTER — Encounter: Payer: Self-pay | Admitting: Physical Therapy

## 2020-07-25 ENCOUNTER — Other Ambulatory Visit: Payer: Self-pay

## 2020-07-25 DIAGNOSIS — R2681 Unsteadiness on feet: Secondary | ICD-10-CM | POA: Diagnosis not present

## 2020-07-25 NOTE — Therapy (Signed)
[facility] [address], [place], [place], [address] Phone: [phone]   Fax:  [phone]  Physical Therapy Treatment  Patient Details  Name: Brandon [name] MRN: [id number] Date of Birth: 1946/03/15 Referring Provider (PT): Dorothyann Peng, NP   Encounter Date: 07/25/2020   PT End of Session - 07/25/20 1449    Visit Number 4    Number of Visits 12    Date for PT Re-Evaluation 09/08/20    Authorization Type HUMANA MCR    Authorization Time Period 07/14/20 to 09/08/20    Authorization - Visit Number 4    Authorization - Number of Visits 12    Progress Note Due on Visit 10    PT Start Time 8413    PT Stop Time 1524    PT Time Calculation (min) 38 min    Activity Tolerance Patient tolerated treatment well    Behavior During Therapy [facility] for tasks assessed/performed           Past Medical History:  Diagnosis Date  . Allergy   . Basal cell carcinoma   . GERD (gastroesophageal reflux disease)   . History of kidney cancer    benign; told not cancerous  . Hypertension     Past Surgical History:  Procedure Laterality Date  . CATARACT EXTRACTION Bilateral   . HERNIA REPAIR     x2  . KIDNEY SURGERY Right    Had right kidney removed.   . TONSILLECTOMY    . VASECTOMY      There were no vitals filed for this visit.   Subjective Assessment - 07/25/20 1450    Subjective Feeling stronger in my legs, i can get out of my chair easier, only a few wobbly moments over this past weekend.    Pertinent History HTN, mild dementia, HOH, prostate CA (mild), restless leg syndrome    Patient Stated Goals to improve balance    Currently in Pain? No/denies    Multiple Pain Sites No                             [facility] Adult PT Treatment/Exercise - 07/25/20 0001      Neuro Re-ed    Neuro Re-ed Details  Floor ladder footwork for balance,   Toe tapping to colorful circles (3) with Bil LE, PTA called out colors      Knee/Hip Exercises: Aerobic   Nustep L3 x 10 min with concurrent discussion of status      Knee/Hip Exercises: Machines for Strengthening   Cybex Leg Press Seat 9 BilLE 90# 3x10      Knee/Hip Exercises: Standing   Hip Flexion Stengthening;Both;1 set;10 reps;Knee straight    Hip Flexion Limitations 4#    SLS with Vectors With UE support unlimited time Bil, then take UE away 5x on each leg      Knee/Hip Exercises: Seated   Long Arc Quad Strengthening;Both;1 set;10 reps;Weights    Long Arc Quad Weight 4 lbs.    Clamshell with TheraBand Red   loop; x20                 PT Education - 07/25/20 1511    Education Details side stepping with green band for HEp    Person(s) Educated Patient    Methods Explanation;Demonstration;Verbal cues;Handout    Comprehension Returned demonstration;Verbalized understanding            PT Short Term Goals -  07/25/20 1451      PT SHORT TERM GOAL #1   Title Ind with initial HEP    Time 3    Period Weeks    Status Achieved    Target Date 08/04/20             PT Long Term Goals - 07/25/20 1521      PT LONG TERM GOAL #1   Title Ind with advanced HEP    Time 8    Period Weeks    Status On-going      PT LONG TERM GOAL #3   Title Increased SLS time to >= 5 sec bil to decrease fall risk.    Time 8    Period Weeks    Status On-going   2-3 sec with no UE                Plan - 07/25/20 1449    Clinical Impression Statement Pt arrives with reports of getting out of chairs easier at home and an overall more positive regarding hs balance. He reports only a few wobbly moments over the weekend. Pt able to balance on one leg 2-3 sec solidly with no UE. Pt required no assistance performing floor ladder balance drills today. PTA added resisted lateral stepping for HEP today.    Personal Factors and Comorbidities Comorbidity 3+    Comorbidities HTN, mild dementia, HOH, prostate CA (mild), restless leg syndrome     Stability/Clinical Decision Making Stable/Uncomplicated    Rehab Potential Excellent    PT Frequency 2x / week    PT Duration 8 weeks    PT Treatment/Interventions ADLs/Self Care Home Management;Aquatic Therapy;Gait training;Therapeutic activities;Therapeutic exercise;Balance training;Neuromuscular re-education;Patient/family education;Manual techniques    PT Next Visit Plan Test sit to stand for goals no UE, DGI in next 1-2 visits.    PT Home Exercise Plan [email or web address]    Consulted and Agree with Plan of Care Patient           Patient will benefit from skilled therapeutic intervention in order to improve the following deficits and impairments:  Abnormal gait,Decreased balance,Decreased strength,Impaired flexibility  Visit Diagnosis: Unsteadiness on feet     Problem List Patient Active Problem List   Diagnosis Date Noted  . Hyperlipidemia 02/13/2018  . BPH (benign prostatic hyperplasia) 01/31/2017  . Heart murmur 01/20/2016  . HTN (hypertension) 03/09/2012    Sedra Morfin, PTA 07/25/2020, 3:26 PM  [facility] [address] [place], [place], [address] Phone: [phone]   Fax:  [phone]  Name: Brandon [name] MRN: [id number] Date of Birth: [birth year]  Access Code: [email or web address]: [email or web address]: 03/14/2022Prepared by: Anderson Malta CochranExercises  Seated Cervical Rotation AROM - 2 x daily - 7 x weekly - 1 sets - 5 reps - 5 sec hold  Seated Cervical Sidebending AROM - 2 x daily - 7 x weekly - 1 sets - 5 reps - 5 sec hold  Standing Hip Flexion March - 2 x daily - 7 x weekly - 2 sets - 20 reps  Sit to Stand with Armchair - 1 x daily - 7 x weekly - 1 sets - 10 reps  Standing Single Leg Stance with Counter Support - 1 x daily - 7 x weekly - 1 sets - 5 reps - 10s hold  Side Stepping with Resistance at Thighs and Counter Support - 1 x daily - 7 x weekly - 3 sets - 10 reps

## 2020-07-27 ENCOUNTER — Encounter: Payer: Self-pay | Admitting: Physical Therapy

## 2020-07-27 ENCOUNTER — Ambulatory Visit: Payer: Medicare HMO | Admitting: Physical Therapy

## 2020-07-27 ENCOUNTER — Other Ambulatory Visit: Payer: Self-pay

## 2020-07-27 DIAGNOSIS — R2681 Unsteadiness on feet: Secondary | ICD-10-CM

## 2020-07-27 NOTE — Therapy (Signed)
[facility] [address] [place], [place], [address] Phone: [phone]   Fax:  [phone]  Physical Therapy Treatment  Patient Details  Name: Brandon [name] MRN: [id number] Date of Birth: 05-06-1946 Referring Provider (PT): Dorothyann Peng, NP   Encounter Date: 07/27/2020   PT End of Session - 07/27/20 1232    Visit Number 5    Number of Visits 12    Date for PT Re-Evaluation 09/08/20    Authorization Type HUMANA MCR    Authorization Time Period 07/14/20 to 09/08/20    Authorization - Visit Number 5    Authorization - Number of Visits 12    Progress Note Due on Visit 10    PT Start Time 1638    PT Stop Time 1309    PT Time Calculation (min) 38 min    Activity Tolerance Patient tolerated treatment well    Behavior During Therapy [facility] for tasks assessed/performed           Past Medical History:  Diagnosis Date  . Allergy   . Basal cell carcinoma   . GERD (gastroesophageal reflux disease)   . History of kidney cancer    benign; told not cancerous  . Hypertension     Past Surgical History:  Procedure Laterality Date  . CATARACT EXTRACTION Bilateral   . HERNIA REPAIR     x2  . KIDNEY SURGERY Right    Had right kidney removed.   . TONSILLECTOMY    . VASECTOMY      There were no vitals filed for this visit.   Subjective Assessment - 07/27/20 1234    Subjective Doing good, no complaints this AM.    Pertinent History HTN, mild dementia, HOH, prostate CA (mild), restless leg syndrome    Currently in Pain? No/denies    Multiple Pain Sites No              [facility] PT Assessment - 07/27/20 0001      Transfers   Five time sit to stand comments  9 sec at hilo table with LE at [place] to Stand Able to stand without using hands and stabilize independently   give pt a black pad   Standing Unsupported Able to stand safely 2 minutes    Sitting with Back Unsupported but Feet  Supported on Floor or Stool Able to sit safely and securely 2 minutes    Stand to Sit Sits safely with minimal use of hands    Transfers Able to transfer safely, minor use of hands    Standing Unsupported with Eyes Closed Able to stand 10 seconds safely    Standing Unsupported with Feet Together Able to place feet together independently and stand 1 minute safely    From Standing, Reach Forward with Outstretched Arm Can reach confidently >25 cm (10")    From Standing Position, Pick up Object from Floor Able to pick up shoe safely and easily    From Standing Position, Turn to Look Behind Over each Shoulder Looks behind from both sides and weight shifts well    Turn 360 Degrees Able to turn 360 degrees safely in 4 seconds or less    Standing Unsupported, Alternately Place Feet on Step/Stool Able to stand independently and safely and complete 8 steps in 20 seconds    Standing Unsupported, One Foot in Front Able to place foot tandem independently and hold 30 seconds  Standing on One Leg Tries to lift leg/unable to hold 3 seconds but remains standing independently   pt can use fingers to get in SLS and hold 3 sec, but cannot get into SLS without the support   Total Score 53      Dynamic Gait Index   Level Surface Mild Impairment    Change in Gait Speed Normal    Gait with Horizontal Head Turns Mild Impairment    Gait with Vertical Head Turns Normal    Gait and Pivot Turn Normal    Step Over Obstacle Normal    Step Around Obstacles Normal    Steps Normal    Total Score 22                         [facility] Adult PT Treatment/Exercise - 07/27/20 0001      Neuro Re-ed    Neuro Re-ed Details  Yellow loop below knee for side stepping 6 passes at counter no UE, floor ladder for high knee marching 4x SBA, toe tapping in 3 directions/planes to colored circles 10x each LE.      Knee/Hip Exercises: Aerobic   Nustep L3 x 10 min with concurrent discussion of status      Knee/Hip  Exercises: Machines for Strengthening   Cybex Leg Press Seat 9 BilLE 90# 3x10      Knee/Hip Exercises: Seated   Long Arc Quad Strengthening;Both;2 sets;10 reps;Weights    Long Arc Quad Weight 4 lbs.                    PT Short Term Goals - 07/27/20 1236      PT SHORT TERM GOAL #1   Title Ind with initial HEP    Time 3    Period Weeks    Status Achieved    Target Date 08/04/20             PT Long Term Goals - 07/27/20 1302      PT LONG TERM GOAL #2   Title Improved DGI to 23/24 to decrease fall risk    Time 8    Period Weeks    Status On-going   22/24     PT LONG TERM GOAL #3   Title Increased SLS time to >= 5 sec bil to decrease fall risk.    Time 8    Period Weeks    Status On-going   3 sec     PT LONG TERM GOAL #4   Title Improved sit to stand from chair to <13 sec with no UE assist.    Time 8    Period Weeks    Status Achieved   9 sec     PT LONG TERM GOAL #5   Title Patient to demo improved gait pattern without scissoring and with normal BOS    Period Weeks    Status Achieved                 Plan - 07/27/20 1232    Clinical Impression Statement Pt reports feeling stronger and steadier since coming to PT. " I have not had many balance issues since staring the therapy." Pt met sit to stand goal, improved his single legstance slightly, and improved the DGI by 1 point. Mild fatigue noted with session.    Personal Factors and Comorbidities Comorbidity 3+    Comorbidities HTN, mild dementia, HOH, prostate CA (mild), restless leg syndrome  Stability/Clinical Decision Making Stable/Uncomplicated    Rehab Potential Excellent    PT Frequency 2x / week    PT Duration 8 weeks    PT Treatment/Interventions ADLs/Self Care Home Management;Aquatic Therapy;Gait training;Therapeutic activities;Therapeutic exercise;Balance training;Neuromuscular re-education;Patient/family education;Manual techniques    PT Next Visit Plan MMT, discuss DC at end of this  month. Pt doesn't want to come in April. 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    PT Home Exercise Plan [email or web address]    Consulted and Agree with Plan of Care Patient           Patient will benefit from skilled therapeutic intervention in order to improve the following deficits and impairments:  Abnormal gait,Decreased balance,Decreased strength,Impaired flexibility  Visit Diagnosis: Unsteadiness on feet     Problem List Patient Active Problem List   Diagnosis Date Noted  . Hyperlipidemia 02/13/2018  . BPH (benign prostatic hyperplasia) 01/31/2017  . Heart murmur 01/20/2016  . HTN (hypertension) 03/09/2012    Raksha Wolfgang, PTA 07/27/2020, 1:10 PM  [facility] [address] [place], [place], [address] Phone: [phone]   Fax:  [phone]  Name: Brandon [name] MRN: [id number] Date of Birth: [birth year]

## 2020-08-03 ENCOUNTER — Encounter: Payer: Medicare HMO | Admitting: Physical Therapy

## 2020-08-03 DIAGNOSIS — H353131 Nonexudative age-related macular degeneration, bilateral, early dry stage: Secondary | ICD-10-CM | POA: Diagnosis not present

## 2020-08-03 DIAGNOSIS — H26493 Other secondary cataract, bilateral: Secondary | ICD-10-CM | POA: Diagnosis not present

## 2020-08-12 ENCOUNTER — Encounter: Payer: Medicare HMO | Admitting: Physical Therapy

## 2020-08-15 ENCOUNTER — Encounter: Payer: Medicare HMO | Admitting: Physical Therapy

## 2020-08-22 ENCOUNTER — Encounter: Payer: Medicare HMO | Admitting: Physical Therapy

## 2020-09-20 ENCOUNTER — Ambulatory Visit: Payer: Medicare HMO | Admitting: Adult Health

## 2020-09-27 ENCOUNTER — Ambulatory Visit: Payer: Medicare HMO | Admitting: Adult Health

## 2020-09-30 ENCOUNTER — Telehealth: Payer: Self-pay | Admitting: Adult Health

## 2020-09-30 NOTE — Telephone Encounter (Signed)
Left message for patient to call back and schedule Medicare Annual Wellness Visit (AWV) either virtually or in office.   AWV-I per PALMETTO 12/12/09  please schedule at anytime with [facility] Nurse Health Advisor 1 or 2   This should be a 45 minute visit.  

## 2020-10-04 ENCOUNTER — Other Ambulatory Visit: Payer: Self-pay | Admitting: Urology

## 2020-10-04 DIAGNOSIS — C61 Malignant neoplasm of prostate: Secondary | ICD-10-CM

## 2020-10-11 ENCOUNTER — Other Ambulatory Visit: Payer: Self-pay | Admitting: Adult Health

## 2020-10-11 DIAGNOSIS — I10 Essential (primary) hypertension: Secondary | ICD-10-CM

## 2020-11-05 ENCOUNTER — Inpatient Hospital Stay: Admission: RE | Admit: 2020-11-05 | Payer: Medicare HMO | Source: Ambulatory Visit

## 2020-11-16 ENCOUNTER — Telehealth: Payer: Self-pay | Admitting: Adult Health

## 2020-11-16 NOTE — Telephone Encounter (Signed)
Left message for patient to call back and schedule Medicare Annual Wellness Visit (AWV) either virtually or in office.   AWV-I per PALMETTO 12/12/09  please schedule at anytime with [facility] Nurse Health Advisor 1 or 2   This should be a 45 minute visit.

## 2020-11-28 ENCOUNTER — Other Ambulatory Visit: Payer: Self-pay | Admitting: Urology

## 2020-11-28 DIAGNOSIS — R972 Elevated prostate specific antigen [PSA]: Secondary | ICD-10-CM

## 2020-11-29 ENCOUNTER — Ambulatory Visit
Admission: RE | Admit: 2020-11-29 | Discharge: 2020-11-29 | Disposition: A | Discharge status: Home / Self Care | Payer: Medicare HMO | Source: Ambulatory Visit | Attending: Urology | Admitting: Urology

## 2020-11-29 ENCOUNTER — Inpatient Hospital Stay: Admission: RE | Admit: 2020-11-29 | Payer: Medicare HMO | Source: Ambulatory Visit

## 2020-11-29 DIAGNOSIS — N400 Enlarged prostate without lower urinary tract symptoms: Secondary | ICD-10-CM | POA: Diagnosis not present

## 2020-11-29 DIAGNOSIS — R972 Elevated prostate specific antigen [PSA]: Secondary | ICD-10-CM | POA: Diagnosis not present

## 2020-11-29 DIAGNOSIS — R590 Localized enlarged lymph nodes: Secondary | ICD-10-CM | POA: Diagnosis not present

## 2020-11-29 IMAGING — MR MR PROSTATE WO/W CM
12 series · 48 of 48 positions shown · IV contrast (20 mL Multihance)
Comparison: Prostate MRI [DATE]

CLINICAL DATA: 75-year-old male with elevated PSA PSA equal 1.15.
negative prostate biopsy [DATE].

EXAM:
MR PROSTATE WITHOUT AND WITH CONTRAST
TECHNIQUE: Multiplanar multisequence MRI images were obtained of the pelvis
centered about the prostate. Pre and post contrast images were
obtained.
CONTRAST:  20mL MULTIHANCE GADOBENATE DIMEGLUMINE 529 MG/ML IV SOLN

[Series 3: T2 · coronal · 3.0mm · 0.56mm/px · 1 of 25 slices shown (1 of 3)]
[im 1/25]
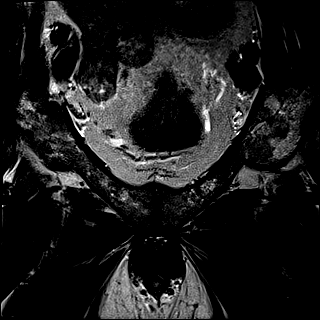

[Series 4: T1 · axial · 5.0mm · 1.25mm/px · 1 of 96 slices shown]
[im 1/96]
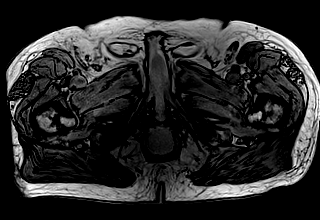

[Series 5: DWI · axial · 3.0mm · 1.75mm/px · 1 of 84 slices shown (1 of 3)]
[im 1/84]
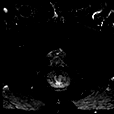

[Series 6: DWI · axial · 3.0mm · 1.75mm/px · 1 of 28 slices shown (2 of 3)]
[im 1/28]
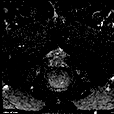

[Series 7: DWI · axial · 3.0mm · 1.75mm/px · 1 of 28 slices shown (3 of 3)]
[im 1/28]
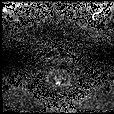

[Series 8: T2 · axial · 1.0mm · 1.04mm/px · z∈[-8,+63]mm · 2 of 72 slices shown (2 of 3)]
[im 1/72]
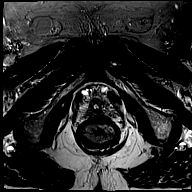
[im 72/72]
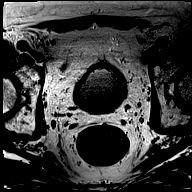

[Series 9: T2 · axial · 3.0mm · 0.56mm/px · 1 of 28 slices shown (3 of 3)]
[im 1/28]
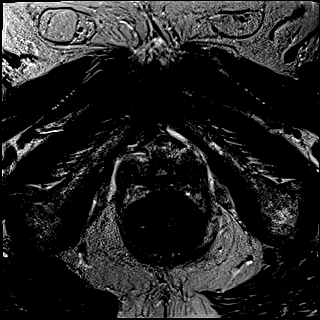

[Series 10: pre t1_twist_tra_dyn · axial · non-contrast · 3.5mm · 0.83mm/px · 1 of 26 slices shown]
[im 1/26]
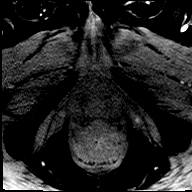

[Series 11: post t1_twist_tra_dyn-copy center · axial · non-contrast · 3.5mm · 0.83mm/px · z∈[-14,+74]mm · 18 of 780 slices shown]
[im 1/780]
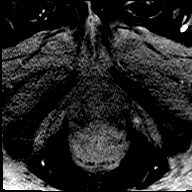
[im 46/780]
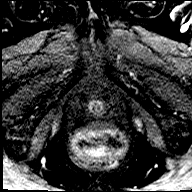
[im 92/780]
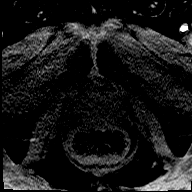
[im 138/780]
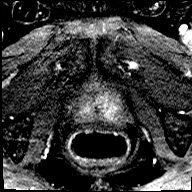
[im 184/780]
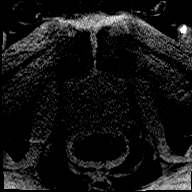
[im 230/780]
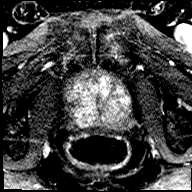
[im 275/780]
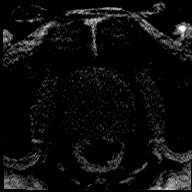
[im 321/780]
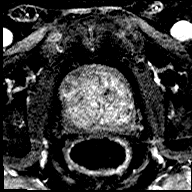
[im 367/780]
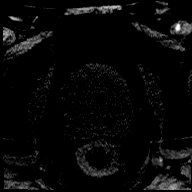
[im 413/780]
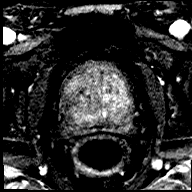
[im 459/780]
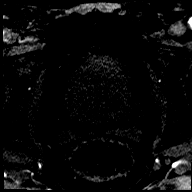
[im 505/780]
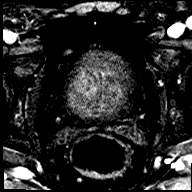
[im 550/780]
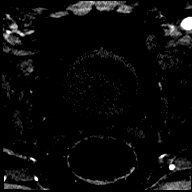
[im 596/780]
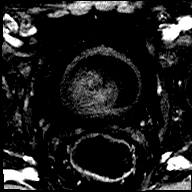
[im 642/780]
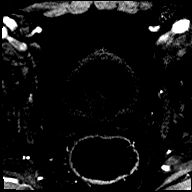
[im 688/780]
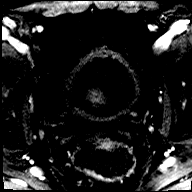
[im 734/780]
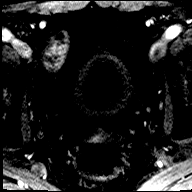
[im 780/780]
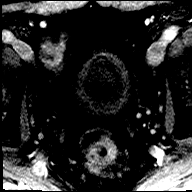

[Series 12: post t1_twist_tra_dyn-copy cent_sub · axial · 3.5mm · 0.83mm/px · z∈[-14,+74]mm · 17 of 746 slices shown]
[im 1/746]
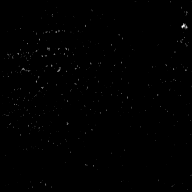
[im 47/746]
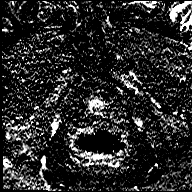
[im 94/746]
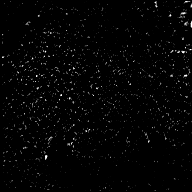
[im 140/746]
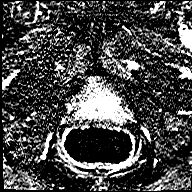
[im 187/746]
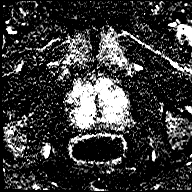
[im 233/746]
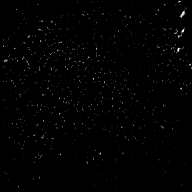
[im 280/746]
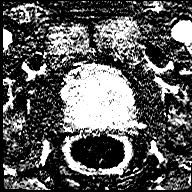
[im 326/746]
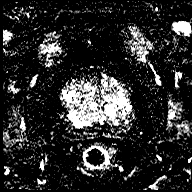
[im 373/746]
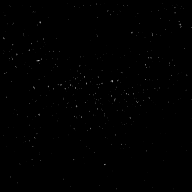
[im 420/746]
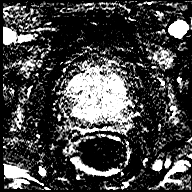
[im 466/746]
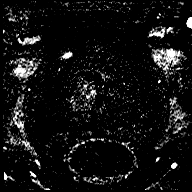
[im 513/746]
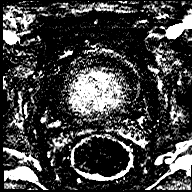
[im 559/746]
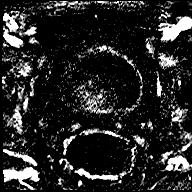
[im 606/746]
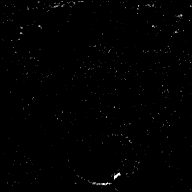
[im 652/746]
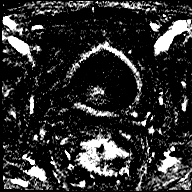
[im 699/746]
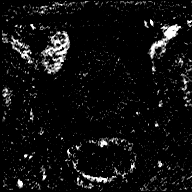
[im 746/746]
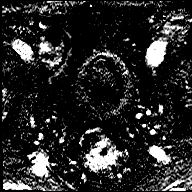

[Series 13: t1_vibe_dixon_tra_f · axial · 2.5mm · 0.91mm/px · z∈[-12,+186]mm · 2 of 80 slices shown]
[im 1/80]
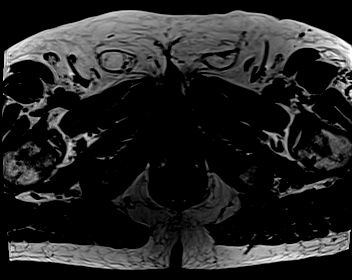
[im 80/80]
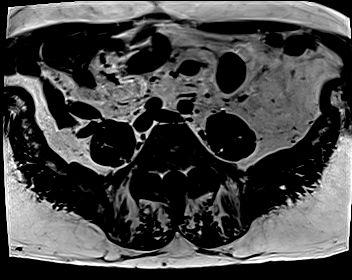

[Series 14: t1_vibe_dixon_tra_w · axial · 2.5mm · 0.91mm/px · z∈[-12,+186]mm · 2 of 80 slices shown]
[im 1/80]
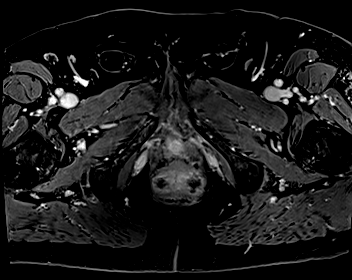
[im 80/80]
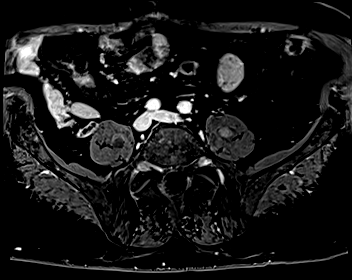

[48 of 48 positions shown; findings below may reference images not displayed]

FINDINGS: Prostate: The peripheral zone is thinned by the enlarged
transitional zone. There is no focal lesion within the peripheral
zone on T2 weighted imaging (series 9). There are no foci of
restricted diffusion within the thin peripheral zone (series 5).

The transitional zone is enlarged well capsulated nodules. No
suspicious imaging characteristics on T2 weighted imaging (series 9)

Prostatic capsule is intact.  Seminal vesicles normal.

Postcontrast T1 weighted imaging demonstrates no suspicious
enhancing pattern

Volume: 6.7x 6.5 x 5.9 cm (volume = 130 cm^3)

Transcapsular spread:  Absent

Seminal vesicle involvement: Absent

Neurovascular bundle involvement: Absent

Pelvic adenopathy: Absent

Bone metastasis: Absent

Other findings: None
IMPRESSION: 1. No high-grade carcinoma in the peripheral zone.  PI-RADS: 1
2. Enlarged nodular transitional zone most consistent benign
prostate hypertrophy. PI-RADS: 2

## 2020-11-29 MED ORDER — GADOBENATE DIMEGLUMINE 529 MG/ML IV SOLN
20.0000 mL | Freq: Once | INTRAVENOUS | Status: AC | PRN
  Administered 2020-11-29: 20 mL via INTRAVENOUS

## 2020-12-01 DIAGNOSIS — C61 Malignant neoplasm of prostate: Secondary | ICD-10-CM | POA: Diagnosis not present

## 2020-12-01 LAB — PSA: PSA: 1.22

## 2020-12-12 ENCOUNTER — Telehealth: Payer: Self-pay | Admitting: Adult Health

## 2020-12-12 NOTE — Telephone Encounter (Signed)
Left message for patient to call back and schedule Medicare Annual Wellness Visit (AWV) either virtually or in office.    AWV-I per PALMETTO 12/12/09 please schedule at anytime with [facility] Nurse Health Advisor 1 or 2   This should be a 45 minute visit.

## 2021-01-30 ENCOUNTER — Telehealth: Payer: Self-pay | Admitting: Adult Health

## 2021-01-30 NOTE — Telephone Encounter (Signed)
Left message for patient to call back and schedule Medicare Annual Wellness Visit (AWV) either virtually or in office. Left  my Brandon [name] number [phone]   AWV-I per PALMETTO 12/12/09 please schedule at anytime with [facility] Nurse Health Advisor 1 or 2   This should be a 45 minute visit.

## 2021-01-31 DIAGNOSIS — N5201 Erectile dysfunction due to arterial insufficiency: Secondary | ICD-10-CM | POA: Diagnosis not present

## 2021-01-31 DIAGNOSIS — R3915 Urgency of urination: Secondary | ICD-10-CM | POA: Diagnosis not present

## 2021-01-31 DIAGNOSIS — C61 Malignant neoplasm of prostate: Secondary | ICD-10-CM | POA: Diagnosis not present

## 2021-02-17 ENCOUNTER — Encounter: Payer: Self-pay | Admitting: Adult Health

## 2021-03-13 ENCOUNTER — Telehealth: Payer: Self-pay | Admitting: Adult Health

## 2021-03-13 NOTE — Telephone Encounter (Signed)
Left message for patient to call back and schedule Medicare Annual Wellness Visit (AWV) either virtually or in office. Left  my Herbie Drape number [phone]  AWV-I per PALMETTO 12/12/09  please schedule at anytime with [facility] Nurse Health Advisor 1 or 2   This should be a 45 minute visit.

## 2021-03-13 NOTE — Telephone Encounter (Signed)
Left message for patient to call back and schedule Medicare Annual Wellness Visit (AWV) either virtually or in office. Left  my Brandon [name] number [phone]   AWV-I per PALMETTO 12/12/09 please schedule at anytime with [facility] Nurse Health Advisor 1 or 2   This should be a 45 minute visit.

## 2021-05-14 ENCOUNTER — Other Ambulatory Visit: Payer: Self-pay | Admitting: Adult Health

## 2021-05-14 DIAGNOSIS — I10 Essential (primary) hypertension: Secondary | ICD-10-CM

## 2021-07-12 ENCOUNTER — Other Ambulatory Visit: Payer: Self-pay | Admitting: Adult Health

## 2021-07-12 DIAGNOSIS — I10 Essential (primary) hypertension: Secondary | ICD-10-CM

## 2021-07-13 NOTE — Telephone Encounter (Signed)
Patient need to schedule an ov for more refills. 

## 2021-07-19 ENCOUNTER — Other Ambulatory Visit: Payer: Self-pay | Admitting: Adult Health

## 2021-07-19 DIAGNOSIS — I10 Essential (primary) hypertension: Secondary | ICD-10-CM

## 2021-07-19 NOTE — Telephone Encounter (Signed)
Patient need to schedule an ov for more refills. 

## 2021-07-24 DIAGNOSIS — C61 Malignant neoplasm of prostate: Secondary | ICD-10-CM | POA: Diagnosis not present

## 2021-07-24 LAB — PSA: PSA: 1.9

## 2021-07-31 DIAGNOSIS — N5201 Erectile dysfunction due to arterial insufficiency: Secondary | ICD-10-CM | POA: Diagnosis not present

## 2021-07-31 DIAGNOSIS — C61 Malignant neoplasm of prostate: Secondary | ICD-10-CM | POA: Diagnosis not present

## 2021-07-31 DIAGNOSIS — R3915 Urgency of urination: Secondary | ICD-10-CM | POA: Diagnosis not present

## 2021-08-08 ENCOUNTER — Encounter: Payer: Self-pay | Admitting: Adult Health

## 2021-09-29 ENCOUNTER — Other Ambulatory Visit: Payer: Self-pay | Admitting: Adult Health

## 2021-09-29 DIAGNOSIS — I10 Essential (primary) hypertension: Secondary | ICD-10-CM

## 2021-09-29 NOTE — Telephone Encounter (Signed)
Pt needs an appt

## 2021-10-11 ENCOUNTER — Ambulatory Visit: Payer: Medicare HMO

## 2021-10-18 ENCOUNTER — Ambulatory Visit: Payer: Medicare HMO

## 2021-10-18 ENCOUNTER — Encounter: Payer: Self-pay | Admitting: Adult Health

## 2021-10-18 ENCOUNTER — Ambulatory Visit (INDEPENDENT_AMBULATORY_CARE_PROVIDER_SITE_OTHER): Payer: Medicare HMO | Admitting: Adult Health

## 2021-10-18 VITALS — BP 110/70 | HR 98 | Temp 97.9°F | Ht 78.5 in | Wt 248.0 lb

## 2021-10-18 DIAGNOSIS — I10 Essential (primary) hypertension: Secondary | ICD-10-CM

## 2021-10-18 DIAGNOSIS — E7439 Other disorders of intestinal carbohydrate absorption: Secondary | ICD-10-CM

## 2021-10-18 DIAGNOSIS — C61 Malignant neoplasm of prostate: Secondary | ICD-10-CM

## 2021-10-18 DIAGNOSIS — Z0000 Encounter for general adult medical examination without abnormal findings: Secondary | ICD-10-CM | POA: Diagnosis not present

## 2021-10-18 DIAGNOSIS — E785 Hyperlipidemia, unspecified: Secondary | ICD-10-CM

## 2021-10-18 MED ORDER — LISINOPRIL-HYDROCHLOROTHIAZIDE 20-25 MG PO TABS: 1.0000 | ORAL_TABLET | Freq: Every day | ORAL | 3 refills | Status: DC

## 2021-10-18 MED ORDER — ATORVASTATIN CALCIUM 20 MG PO TABS: 20.0000 mg | ORAL_TABLET | Freq: Every day | ORAL | 3 refills | Status: DC

## 2021-10-18 NOTE — Patient Instructions (Signed)
It was great seeing you today   Please schedule a lab appointment at the front desk   We will follow up with you once your labs are back   Please let me know if you need anything

## 2021-10-18 NOTE — Addendum Note (Signed)
Addended by: Octavio Manns E on: 10/18/2021 09:54 AM   Modules accepted: Orders

## 2021-10-18 NOTE — Progress Notes (Signed)
Subjective:    Patient ID: Brandon [name], male    DOB: [birth year], 76 y.o.   MRN: [id number]  HPI Patient presents for yearly preventative medicine examination. He is a pleasant 76 year old male who  has a past medical history of Allergy, Basal cell carcinoma, GERD (gastroesophageal reflux disease), History of kidney cancer, and Hypertension.  Essential hypertension-managed with lisinopril 5 hydrochlorothiazide 20-20 mg.  Denies episodes of lightheadedness, dizziness, headaches, blurred vision, or syncopal episodes  BP Readings from Last 3 Encounters:  10/18/21 110/70  07/13/20 124/68  06/30/20 102/72   Hyperlipidemia-managed with Lipitor 20 mg daily.  He denies myalgia or fatigue Lab Results  Component Value Date   CHOL 93 06/30/2020   HDL 39.50 06/30/2020   LDLCALC 44 06/30/2020   TRIG 48.0 06/30/2020   CHOLHDL 2 06/30/2020   History of prostate cancer-managed by urology.  Currently prescribed Proscar 5 mg and Flomax 0.4 mg.  Currently under watchful waiting due to the slow-growing nature of prostate cancer  Glucose Intolerance -currently diet controlled.  Lab Results  Component Value Date   HGBA1C 6.5 06/30/2020    All immunizations and health maintenance protocols were reviewed with the patient and needed orders were placed.  Appropriate screening laboratory values were ordered for the patient including screening of hyperlipidemia, renal function and hepatic function. If indicated by BPH, a PSA was ordered.  Medication reconciliation,  past medical history, social history, problem list and allergies were reviewed in detail with the patient  Goals were established with regard to weight loss, exercise, and  diet in compliance with medications Wt Readings from Last 3 Encounters:  10/18/21 248 lb (112.5 kg)  07/13/20 254 lb 3.2 oz (115.3 kg)  06/30/20 251 lb (113.9 kg)   Review of Systems  Constitutional: Negative.   HENT: Negative.    Eyes: Negative.    Respiratory: Negative.    Cardiovascular: Negative.   Gastrointestinal: Negative.   Endocrine: Negative.   Genitourinary: Negative.   Musculoskeletal: Negative.   Skin: Negative.   Allergic/Immunologic: Negative.   Neurological: Negative.   Hematological: Negative.   Psychiatric/Behavioral: Negative.    All other systems reviewed and are negative.  Past Medical History:  Diagnosis Date   Allergy    Basal cell carcinoma    GERD (gastroesophageal reflux disease)    History of kidney cancer    benign; told not cancerous   Hypertension     Social History   Socioeconomic History   Marital status: Married    Spouse name: Not on file   Number of children: Not on file   Years of education: Not on file   Highest education level: Not on file  Occupational History   Not on file  Tobacco Use   Smoking status: Former   Smokeless tobacco: Never  Substance and Sexual Activity   Alcohol use: Yes    Alcohol/week: 0.0 standard drinks    Comment: "Beer every once in a while"   Drug use: No   Sexual activity: Not on file  Other Topics Concern   Not on file  Social History Narrative   Retired from [employer]   Married    Three children (Two in [place] and one in [place])    Social Determinants of Health   Financial Resource Strain: Not on file  Food Insecurity: Not on file  Transportation Needs: Not on file  Physical Activity: Not on file  Stress: Not on file  Social Connections: Not on  file  Intimate Partner Violence: Not on file    Past Surgical History:  Procedure Laterality Date   CATARACT EXTRACTION Bilateral    HERNIA REPAIR     x2   KIDNEY SURGERY Right    Had right kidney removed.    TONSILLECTOMY     VASECTOMY      Family History  Problem Relation Age of Onset   Cancer Mother        Breast    Cancer Father        Lung    Colon cancer Neg Hx     No Known Allergies  Current Outpatient Medications on File Prior to Visit  Medication Sig Dispense  Refill   b complex vitamins capsule Take 1 capsule by mouth daily.     cholecalciferol (VITAMIN D) 1000 units tablet Take 1,000 Units by mouth daily.     finasteride (PROSCAR) 5 MG tablet      FLUZONE HIGH-DOSE QUADRIVALENT 0.7 ML SUSY      SHINGRIX injection      tamsulosin (FLOMAX) 0.4 MG CAPS capsule Take 1 capsule (0.4 mg total) by mouth daily. (Patient taking differently: Take 0.4 mg by mouth 2 (two) times daily.) 90 capsule 2   vitamin C (ASCORBIC ACID) 500 MG tablet      No current facility-administered medications on file prior to visit.    BP 110/70   Pulse 98   Temp 97.9 F (36.6 C)   Ht 6' 6.5" (1.994 m)   Wt 248 lb (112.5 kg)   SpO2 96%   BMI 28.30 kg/m       Objective:   Physical Exam Vitals and nursing note reviewed.  Constitutional:      General: He is not in acute distress.    Appearance: Normal appearance. He is well-developed and normal weight.  HENT:     Head: Normocephalic and atraumatic.     Right Ear: Tympanic membrane, ear canal and external ear normal. There is no impacted cerumen.     Left Ear: Tympanic membrane, ear canal and external ear normal. There is no impacted cerumen.     Nose: Nose normal. No congestion or rhinorrhea.     Mouth/Throat:     Mouth: Mucous membranes are moist.     Pharynx: Oropharynx is clear. No oropharyngeal exudate or posterior oropharyngeal erythema.  Eyes:     General:        Right eye: No discharge.        Left eye: No discharge.     Extraocular Movements: Extraocular movements intact.     Conjunctiva/sclera: Conjunctivae normal.     Pupils: Pupils are equal, round, and reactive to light.  Neck:     Vascular: No carotid bruit.     Trachea: No tracheal deviation.  Cardiovascular:     Rate and Rhythm: Normal rate and regular rhythm.     Pulses: Normal pulses.     Heart sounds: Normal heart sounds. No murmur heard.   No friction rub. No gallop.  Pulmonary:     Effort: Pulmonary effort is normal. No respiratory  distress.     Breath sounds: Normal breath sounds. No stridor. No wheezing, rhonchi or rales.  Chest:     Chest wall: No tenderness.  Abdominal:     General: Bowel sounds are normal. There is no distension.     Palpations: Abdomen is soft. There is no mass.     Tenderness: There is no abdominal tenderness. There is no  right CVA tenderness, left CVA tenderness, guarding or rebound.     Hernia: No hernia is present.  Musculoskeletal:        General: No swelling, tenderness, deformity or signs of injury. Normal range of motion.     Right lower leg: No edema.     Left lower leg: No edema.  Lymphadenopathy:     Cervical: No cervical adenopathy.  Skin:    General: Skin is warm and dry.     Capillary Refill: Capillary refill takes less than 2 seconds.     Coloration: Skin is not jaundiced or pale.     Findings: No bruising, erythema, lesion or rash.  Neurological:     General: No focal deficit present.     Mental Status: He is alert and oriented to person, place, and time.     Cranial Nerves: No cranial nerve deficit.     Sensory: No sensory deficit.     Motor: No weakness.     Coordination: Coordination normal.     Gait: Gait normal.     Deep Tendon Reflexes: Reflexes normal.  Psychiatric:        Mood and Affect: Mood normal.        Behavior: Behavior normal.        Thought Content: Thought content normal.        Judgment: Judgment normal.      Assessment & Plan:  1. Routine general medical examination at a health care facility - Encouraged to stay active and exercise - Follow up in one year or sooner if needed - CBC with Differential/Platelet; Future - Comprehensive metabolic panel; Future - Lipid panel; Future - TSH; Future - Hemoglobin A1c; Future  2. Essential hypertension - Well controlled.  - No change in medication - CBC with Differential/Platelet; Future - Comprehensive metabolic panel; Future - Lipid panel; Future - TSH; Future - Hemoglobin A1c; Future -  lisinopril-hydrochlorothiazide (ZESTORETIC) 20-25 MG tablet; Take 1 tablet by mouth daily.  Dispense: 90 tablet; Refill: 3  3. Hyperlipidemia, unspecified hyperlipidemia type - Consider increase in statin  - CBC with Differential/Platelet; Future - Comprehensive metabolic panel; Future - Lipid panel; Future - TSH; Future - Hemoglobin A1c; Future  4. Prostate cancer ([place]) - Follow up with urology as directed - PSA; Future  5. Glucose intolerance - Consider metformin  - CBC with Differential/Platelet; Future - Comprehensive metabolic panel; Future - Lipid panel; Future - TSH; Future - Hemoglobin A1c; Future

## 2021-10-20 ENCOUNTER — Other Ambulatory Visit (INDEPENDENT_AMBULATORY_CARE_PROVIDER_SITE_OTHER): Payer: Medicare HMO

## 2021-10-20 DIAGNOSIS — C61 Malignant neoplasm of prostate: Secondary | ICD-10-CM | POA: Diagnosis not present

## 2021-10-20 DIAGNOSIS — E785 Hyperlipidemia, unspecified: Secondary | ICD-10-CM

## 2021-10-20 DIAGNOSIS — I10 Essential (primary) hypertension: Secondary | ICD-10-CM | POA: Diagnosis not present

## 2021-10-20 DIAGNOSIS — Z0000 Encounter for general adult medical examination without abnormal findings: Secondary | ICD-10-CM | POA: Diagnosis not present

## 2021-10-20 DIAGNOSIS — E7439 Other disorders of intestinal carbohydrate absorption: Secondary | ICD-10-CM

## 2021-10-20 LAB — COMPREHENSIVE METABOLIC PANEL
ALT: 14 U/L (ref 0–53)
AST: 17 U/L (ref 0–37)
Albumin: 3.8 g/dL (ref 3.5–5.2)
Alkaline Phosphatase: 51 U/L (ref 39–117)
BUN: 19 mg/dL (ref 6–23)
CO2: 31 mEq/L (ref 19–32)
Calcium: 9.4 mg/dL (ref 8.4–10.5)
Chloride: 103 mEq/L (ref 96–112)
Creatinine, Ser: 1.09 mg/dL (ref 0.40–1.50)
GFR: 65.05 mL/min (ref >60.00)
Glucose, Bld: 133 mg/dL — ABNORMAL HIGH (ref 70–99)
Potassium: 3.6 mEq/L (ref 3.5–5.1)
Sodium: 141 mEq/L (ref 135–145)
Total Bilirubin: 1.1 mg/dL (ref 0.2–1.2)
Total Protein: 6.1 g/dL (ref 6.0–8.3)

## 2021-10-20 LAB — TSH: TSH: 1.17 u[IU]/mL (ref 0.35–5.50)

## 2021-10-20 LAB — CBC WITH DIFFERENTIAL/PLATELET
Basophils Absolute: 0.1 10*3/uL (ref 0.0–0.1)
Basophils Relative: 1.1 % (ref 0.0–3.0)
Eosinophils Absolute: 0.1 10*3/uL (ref 0.0–0.7)
Eosinophils Relative: 1.5 % (ref 0.0–5.0)
HCT: 41 % (ref 39.0–52.0)
Hemoglobin: 14 g/dL (ref 13.0–17.0)
Lymphocytes Relative: 30.6 % (ref 12.0–46.0)
Lymphs Abs: 1.4 10*3/uL (ref 0.7–4.0)
MCHC: 34.2 g/dL (ref 30.0–36.0)
MCV: 88.4 fl (ref 78.0–100.0)
Monocytes Absolute: 0.3 10*3/uL (ref 0.1–1.0)
Monocytes Relative: 6.8 % (ref 3.0–12.0)
Neutro Abs: 2.8 10*3/uL (ref 1.4–7.7)
Neutrophils Relative %: 60 % (ref 43.0–77.0)
Platelets: 126 10*3/uL — ABNORMAL LOW (ref 150.0–400.0)
RBC: 4.63 Mil/uL (ref 4.22–5.81)
RDW: 13.7 % (ref 11.5–15.5)
WBC: 4.7 10*3/uL (ref 4.0–10.5)

## 2021-10-20 LAB — LIPID PANEL
Cholesterol: 97 mg/dL (ref 0–200)
HDL: 38.7 mg/dL — ABNORMAL LOW (ref >39.00)
LDL Cholesterol: 48 mg/dL (ref 0–99)
NonHDL: 57.98
Total CHOL/HDL Ratio: 2
Triglycerides: 51 mg/dL (ref 0.0–149.0)
VLDL: 10.2 mg/dL (ref 0.0–40.0)

## 2021-10-20 LAB — PSA: PSA: 1.22 ng/mL (ref 0.10–4.00)

## 2021-10-20 LAB — HEMOGLOBIN A1C: Hgb A1c MFr Bld: 6.6 % — ABNORMAL HIGH (ref 4.6–6.5)

## 2021-11-02 ENCOUNTER — Ambulatory Visit (INDEPENDENT_AMBULATORY_CARE_PROVIDER_SITE_OTHER): Payer: Medicare HMO

## 2021-11-02 VITALS — BP 120/62 | HR 60 | Ht 78.5 in | Wt 248.7 lb

## 2021-11-02 DIAGNOSIS — Z0000 Encounter for general adult medical examination without abnormal findings: Secondary | ICD-10-CM | POA: Diagnosis not present

## 2021-11-02 NOTE — Patient Instructions (Signed)
Brandon [name] , Thank you for taking time to come for your Medicare Wellness Visit. I appreciate your ongoing commitment to your health goals. Please review the following plan we discussed and let me know if I can assist you in the future.   These are the goals we discussed:  Goals   None     This is a list of the screening recommended for you and due dates:  Health Maintenance  Topic Date Due   Colon Cancer Screening  02/17/2019   Zoster (Shingles) Vaccine (2 of 2) 04/07/2019   COVID-19 Vaccine (4 - Booster for Pfizer series) 04/30/2020   Flu Shot  12/12/2021   Tetanus Vaccine  03/09/2022   Pneumonia Vaccine  Completed   Hepatitis C Screening: USPSTF Recommendation to screen - Ages 18-79 yo.  Completed   HPV Vaccine  Aged Out   Advanced directives: No  Conditions/risks identified: None  Next appointment: Follow up in one year for your annual wellness visit.    Preventive Care 82 Years and Older, Male Preventive care refers to lifestyle choices and visits with your health care provider that can promote health and wellness. What does preventive care include? A yearly physical exam. This is also called an annual well check. Dental exams once or twice a year. Routine eye exams. Ask your health care provider how often you should have your eyes checked. Personal lifestyle choices, including: Daily care of your teeth and gums. Regular physical activity. Eating a healthy diet. Avoiding tobacco and drug use. Limiting alcohol use. Practicing safe sex. Taking low doses of aspirin every day. Taking vitamin and mineral supplements as recommended by your health care provider. What happens during an annual well check? The services and screenings done by your health care provider during your annual well check will depend on your age, overall health, lifestyle risk factors, and family history of disease. Counseling  Your health care provider may ask you questions about your: Alcohol  use. Tobacco use. Drug use. Emotional well-being. Home and relationship well-being. Sexual activity. Eating habits. History of falls. Memory and ability to understand (cognition). Work and work Statistician. Screening  You may have the following tests or measurements: Height, weight, and BMI. Blood pressure. Lipid and cholesterol levels. These may be checked every 5 years, or more frequently if you are over 36 years old. Skin check. Lung cancer screening. You may have this screening every year starting at age 43 if you have a 30-pack-year history of smoking and currently smoke or have quit within the past 15 years. Fecal occult blood test (FOBT) of the stool. You may have this test every year starting at age 81. Flexible sigmoidoscopy or colonoscopy. You may have a sigmoidoscopy every 5 years or a colonoscopy every 10 years starting at age 35. Prostate cancer screening. Recommendations will vary depending on your family history and other risks. Hepatitis C blood test. Hepatitis B blood test. Sexually transmitted disease (STD) testing. Diabetes screening. This is done by checking your blood sugar (glucose) after you have not eaten for a while (fasting). You may have this done every 1-3 years. Abdominal aortic aneurysm (AAA) screening. You may need this if you are a current or former smoker. Osteoporosis. You may be screened starting at age 18 if you are at high risk. Talk with your health care provider about your test results, treatment options, and if necessary, the need for more tests. Vaccines  Your health care provider may recommend certain vaccines, such as: Influenza vaccine. This  is recommended every year. Tetanus, diphtheria, and acellular pertussis (Tdap, Td) vaccine. You may need a Td booster every 10 years. Zoster vaccine. You may need this after age 94. Pneumococcal 13-valent conjugate (PCV13) vaccine. One dose is recommended after age 61. Pneumococcal polysaccharide  (PPSV23) vaccine. One dose is recommended after age 46. Talk to your health care provider about which screenings and vaccines you need and how often you need them. This information is not intended to replace advice given to you by your health care provider. Make sure you discuss any questions you have with your health care provider. Document Released: 05/27/2015 Document Revised: 01/18/2016 Document Reviewed: 03/01/2015 Elsevier Interactive Patient Education  2017 [employer] Prevention in the Home Falls can cause injuries. They can happen to people of all ages. There are many things you can do to make your home safe and to help prevent falls. What can I do on the outside of my home? Regularly fix the edges of walkways and driveways and fix any cracks. Remove anything that might make you trip as you walk through a door, such as a raised step or threshold. Trim any bushes or trees on the path to your home. Use bright outdoor lighting. Clear any walking paths of anything that might make someone trip, such as rocks or tools. Regularly check to see if handrails are loose or broken. Make sure that both sides of any steps have handrails. Any raised decks and porches should have guardrails on the edges. Have any leaves, snow, or ice cleared regularly. Use sand or salt on walking paths during winter. Clean up any spills in your garage right away. This includes oil or grease spills. What can I do in the bathroom? Use night lights. Install grab bars by the toilet and in the tub and shower. Do not use towel bars as grab bars. Use non-skid mats or decals in the tub or shower. If you need to sit down in the shower, use a plastic, non-slip stool. Keep the floor dry. Clean up any water that spills on the floor as soon as it happens. Remove soap buildup in the tub or shower regularly. Attach bath mats securely with double-sided non-slip rug tape. Do not have throw rugs and other things on the  floor that can make you trip. What can I do in the bedroom? Use night lights. Make sure that you have a light by your bed that is easy to reach. Do not use any sheets or blankets that are too big for your bed. They should not hang down onto the floor. Have a firm chair that has side arms. You can use this for support while you get dressed. Do not have throw rugs and other things on the floor that can make you trip. What can I do in the kitchen? Clean up any spills right away. Avoid walking on wet floors. Keep items that you use a lot in easy-to-reach places. If you need to reach something above you, use a strong step stool that has a grab bar. Keep electrical cords out of the way. Do not use floor polish or wax that makes floors slippery. If you must use wax, use non-skid floor wax. Do not have throw rugs and other things on the floor that can make you trip. What can I do with my stairs? Do not leave any items on the stairs. Make sure that there are handrails on both sides of the stairs and use them. Fix handrails that  are broken or loose. Make sure that handrails are as long as the stairways. Check any carpeting to make sure that it is firmly attached to the stairs. Fix any carpet that is loose or worn. Avoid having throw rugs at the top or bottom of the stairs. If you do have throw rugs, attach them to the floor with carpet tape. Make sure that you have a light switch at the top of the stairs and the bottom of the stairs. If you do not have them, ask someone to add them for you. What else can I do to help prevent falls? Wear shoes that: Do not have high heels. Have rubber bottoms. Are comfortable and fit you well. Are closed at the toe. Do not wear sandals. If you use a stepladder: Make sure that it is fully opened. Do not climb a closed stepladder. Make sure that both sides of the stepladder are locked into place. Ask someone to hold it for you, if possible. Clearly mark and make  sure that you can see: Any grab bars or handrails. First and last steps. Where the edge of each step is. Use tools that help you move around (mobility aids) if they are needed. These include: Canes. Walkers. Scooters. Crutches. Turn on the lights when you go into a dark area. Replace any light bulbs as soon as they burn out. Set up your furniture so you have a clear path. Avoid moving your furniture around. If any of your floors are uneven, fix them. If there are any pets around you, be aware of where they are. Review your medicines with your doctor. Some medicines can make you feel dizzy. This can increase your chance of falling. Ask your doctor what other things that you can do to help prevent falls. This information is not intended to replace advice given to you by your health care provider. Make sure you discuss any questions you have with your health care provider. Document Released: 02/24/2009 Document Revised: 10/06/2015 Document Reviewed: 06/04/2014 Elsevier Interactive Patient Education  2017 [employer].

## 2021-11-02 NOTE — Progress Notes (Signed)
Subjective:   Brandon [name] is a 76 y.o. male who presents for Medicare Annual/Subsequent preventive examination.  Review of Systems     Cardiac Risk Factors include: advanced age (>57mn, >>14women);hypertension;male gender     Objective:    Today's Vitals   11/02/21 1314  BP: 120/62  Pulse: 60  SpO2: 96%  Weight: 248 lb 11.2 oz (112.8 kg)  Height: 6' 6.5" (1.994 m)   Body mass index is 28.38 kg/m.     11/02/2021    1:40 PM 07/14/2020   12:36 PM 01/30/2016    1:10 PM 01/30/2016    1:09 PM  Advanced Directives  Does Patient Have a Medical Advance Directive? No No No No  Would patient like information on creating a medical advance directive? No - Patient declined No - Patient declined No - patient declined information No - patient declined information    Current Medications (verified) Outpatient Encounter Medications as of 11/02/2021  Medication Sig   atorvastatin (LIPITOR) 20 MG tablet Take 1 tablet (20 mg total) by mouth daily.   b complex vitamins capsule Take 1 capsule by mouth daily.   cholecalciferol (VITAMIN D) 1000 units tablet Take 1,000 Units by mouth daily.   finasteride (PROSCAR) 5 MG tablet    FLUZONE HIGH-DOSE QUADRIVALENT 0.7 ML SUSY    lisinopril-hydrochlorothiazide (ZESTORETIC) 20-25 MG tablet Take 1 tablet by mouth daily.   SHINGRIX injection    tamsulosin (FLOMAX) 0.4 MG CAPS capsule Take 1 capsule (0.4 mg total) by mouth daily. (Patient taking differently: Take 0.4 mg by mouth 2 (two) times daily.)   vitamin C (ASCORBIC ACID) 500 MG tablet    No facility-administered encounter medications on file as of 11/02/2021.    Allergies (verified) Patient has no known allergies.   History: Past Medical History:  Diagnosis Date   Allergy    Basal cell carcinoma    GERD (gastroesophageal reflux disease)    History of kidney cancer    benign; told not cancerous   Hypertension    Past Surgical History:  Procedure Laterality Date   CATARACT EXTRACTION  Bilateral    HERNIA REPAIR     x2   KIDNEY SURGERY Right    Had right kidney removed.    TONSILLECTOMY     VASECTOMY     Family History  Problem Relation Age of Onset   Cancer Mother        Breast    Cancer Father        Lung    Colon cancer Neg Hx    Social History   Socioeconomic History   Marital status: Married    Spouse name: Not on file   Number of children: Not on file   Years of education: Not on file   Highest education level: Not on file  Occupational History   Not on file  Tobacco Use   Smoking status: Former   Smokeless tobacco: Never  Substance and Sexual Activity   Alcohol use: Yes    Alcohol/week: 0.0 standard drinks of alcohol    Comment: "Beer every once in a while"   Drug use: No   Sexual activity: Not on file  Other Topics Concern   Not on file  Social History Narrative   Retired from [employer]  Married    [place]    Social Determinants of Health   Financial Resource Strain: Low Risk  (11/02/2021)  Overall Financial Resource Strain (CARDIA)    Difficulty of Paying Living Expenses: Not hard at all  Food Insecurity: No Food Insecurity (11/02/2021)   Hunger Vital Sign    Worried About Running Out of Food in the Last Year: Never true    Ran Out of Food in the Last Year: Never true  Transportation Needs: No Transportation Needs (11/02/2021)   PRAPARE - Hydrologist (Medical): No    Lack of Transportation (Non-Medical): No  Physical Activity: Insufficiently Active (11/02/2021)   Exercise Vital Sign    Days of Exercise per Week: 2 days    Minutes of Exercise per Session: 20 min  Stress: No Stress Concern Present (11/02/2021)   [place]    Feeling of Stress : Not at all  Social Connections: Socially Isolated (11/02/2021)   Social Connection and Isolation Panel [NHANES]    Frequency of Communication with Friends  and Family: Once a week    Frequency of Social Gatherings with Friends and Family: Once a week    Attends Religious Services: Never    Marine scientist or Organizations: No    Attends Music therapist: Patient refused    Marital Status: Married     Clinical Intake:  Pre-visit preparation completed: Yes How often do you need to have someone help you when you read instructions, pamphlets, or other written materials from your doctor or pharmacy?: 1 - Never  Diabetic? No    Activities of Daily Living    11/02/2021    1:33 PM 11/02/2021   11:53 AM  In your present state of health, do you have any difficulty performing the following activities:  Hearing? 1 1  Comment Wears hearing aids   Vision? 0 1  Difficulty concentrating or making decisions? 0 0  Walking or climbing stairs? 0 0  Dressing or bathing? 0 0  Doing errands, shopping? 0 0  Preparing Food and eating ? N N  Using the Toilet? N N  In the past six months, have you accidently leaked urine? N Y  Do you have problems with loss of bowel control? N N  Managing your Medications? N N  Managing your Finances? N N  Housekeeping or managing your Housekeeping? N N    Patient Care Team: Dorothyann Peng, NP as PCP - General (Family Medicine) Alexis Frock, MD as Consulting Physician (Urology)  Indicate any recent Medical Services you may have received from other than [facility] providers in the past year (date may be approximate).     Assessment:   This is a routine wellness examination for [facility].  Hearing/Vision screen Hearing Screening - Comments:: Wears hearing aids Vision Screening - Comments:: Wears glasses Followed by [place] issues and exercise activities discussed: Exercise limited by: None identified   Goals Addressed               This Visit's Progress     Patient stated (pt-stated)        I want to get out of pre diabetic range. Reduce sugar intake and walk more.        Depression Screen    11/02/2021    1:30 PM 10/18/2021    8:57 AM 06/30/2020    9:45 AM 05/28/2019    7:31 AM 02/13/2018    7:48 AM 01/31/2017    9:21 AM 01/20/2016    8:29 AM  PHQ 2/9 Scores  PHQ - 2 Score 0 0 0 0 0 0 0  PHQ- 9 Score 0 0         Fall Risk    11/02/2021    1:36 PM 11/02/2021   11:53 AM 10/18/2021    8:57 AM 10/11/2021   12:20 PM 06/30/2020    9:45 AM  Fall Risk   Falls in the past year? '1 1 1 1   1 1  '$ Number falls in past yr: 0 0 0 1   1 0  Injury with Fall? 1 0 0 0   0 1  Comment Bruise head and shoulders. No medical attention needed      Risk for fall due to : Other (Comment)  History of fall(s)    Risk for fall due to: Comment Lost balance      Follow up Falls prevention discussed  Falls evaluation completed      FALL RISK PREVENTION PERTAINING TO THE HOME:  Any stairs in or around the home? No  If so, are there any without handrails? No  Home free of loose throw rugs in walkways, pet beds, electrical cords, etc? Yes  Adequate lighting in your home to reduce risk of falls? Yes   ASSISTIVE DEVICES UTILIZED TO PREVENT FALLS:  Life alert? No  Use of a cane, walker or w/c? No  Grab bars in the bathroom? No  Shower chair or bench in shower? Yes  Elevated toilet seat or a handicapped toilet? No   TIMED UP AND GO:  Was the test performed? Yes .  Length of time to ambulate 10 feet: 5 sec.   Gait slow and steady without use of assistive device  Cognitive Function:        11/02/2021    1:41 PM  6CIT Screen  What Year? 0 points  What month? 0 points  What time? 0 points  Count back from 20 0 points  Months in reverse 0 points  Repeat phrase 0 points  Total Score 0 points    Immunizations Immunization History  Administered Date(s) Administered   Influenza, High Dose Seasonal PF 01/20/2016, 01/31/2017, 02/13/2018, 02/10/2019   Influenza-Unspecified 02/11/2019, 03/04/2020   PFIZER(Purple Top)SARS-COV-2 Vaccination 06/03/2019, 06/26/2019,  03/05/2020   Pneumococcal Conjugate-13 01/20/2016   Pneumococcal Polysaccharide-23 10/24/2012   Tdap 03/09/2012   Zoster Recombinat (Shingrix) 02/10/2019    TDAP status: Up to date    Pneumococcal vaccine status: Up to date  Covid-19 vaccine status: Completed vaccines  Qualifies for Shingles Vaccine? Yes   Zostavax completed Yes   Shingrix Completed?: Yes  Screening Tests Health Maintenance  Topic Date Due   COVID-19 Vaccine (4 - Booster for Pfizer series) 11/18/2021 (Originally 04/30/2020)   Zoster Vaccines- Shingrix (2 of 2) 02/02/2022 (Originally 04/07/2019)   COLONOSCOPY (Pts 45-85yr Insurance coverage will need to be confirmed)  11/03/2022 (Originally 02/17/2019)   INFLUENZA VACCINE  12/12/2021   TETANUS/TDAP  03/09/2022   Pneumonia Vaccine 76 Years old  Completed   Hepatitis C Screening  Completed   HPV VACCINES  Aged Out    Health Maintenance  There are no preventive care reminders to display for this patient.   Colorectal cancer screening: No longer required.   Lung Cancer Screening: (Low Dose CT Chest recommended if Age 78-80years, 30 pack-year currently smoking OR have quit w/in 15years.) does not qualify.     Additional Screening:  Hepatitis C Screening: does qualify; Completed 01/31/17  Vision Screening: Recommended annual ophthalmology exams for early detection of  glaucoma and other disorders of the eye. Is the patient up to date with their annual eye exam?  Yes  Who is the provider or what is the name of the office in which the patient attends annual eye exams? [facility] If pt is not established with a provider, would they like to be referred to a provider to establish care? No .   Dental Screening: Recommended annual dental exams for proper oral hygiene  Community Resource Referral / Chronic Care Management:   CRR required this visit?  No   CCM required this visit?  No      Plan:     I have personally reviewed and noted the  following in the patient's chart:   Medical and social history Use of alcohol, tobacco or illicit drugs  Current medications and supplements including opioid prescriptions. Patient is not currently taking opioid prescriptions. Functional ability and status Nutritional status Physical activity Advanced directives List of other physicians Hospitalizations, surgeries, and ER visits in previous 12 months Vitals Screenings to include cognitive, depression, and falls Referrals and appointments  In addition, I have reviewed and discussed with patient certain preventive protocols, quality metrics, and best practice recommendations. A written personalized care plan for preventive services as well as general preventive health recommendations were provided to patient.     Criselda Peaches, LPN   [id number]   Nurse Notes: None

## 2021-11-20 ENCOUNTER — Encounter: Payer: Self-pay | Admitting: Adult Health

## 2021-11-22 ENCOUNTER — Telehealth (INDEPENDENT_AMBULATORY_CARE_PROVIDER_SITE_OTHER): Payer: Medicare HMO | Admitting: Adult Health

## 2021-11-22 ENCOUNTER — Encounter: Payer: Self-pay | Admitting: Adult Health

## 2021-11-22 VITALS — Ht 78.5 in | Wt 250.0 lb

## 2021-11-22 DIAGNOSIS — R519 Headache, unspecified: Secondary | ICD-10-CM

## 2021-11-22 NOTE — Progress Notes (Signed)
Virtual Visit via Telephone Note  I connected with Melanee Spry on 11/22/21 at  4:00 PM EDT by telephone and verified that I am speaking with the correct person using two identifiers.   I discussed the limitations, risks, security and privacy concerns of performing an evaluation and management service by telephone and the availability of in person appointments. I also discussed with the patient that there may be a patient responsible charge related to this service. The patient expressed understanding and agreed to proceed.  Location patient: home Location provider: work or home office Participants present for the call: patient, provider Patient did not have a visit in the prior 7 days to address this/these issue(s).   History of Present Illness: 76 year old male who is being evaluated today for complaint of headache.  He reports a low-grade headache pretty continuously for the last 2 weeks.  Headache is located in his forehead area.  He does feel as though it has contributed to worsening gait.  He denies fevers, chills, blurred vision, sinus congestion, facial droop, or slurred speech.  At home he has tried Tylenol and aspirin which she feels made his headache worse.  Does have a history of headaches in the past but they happen very infrequently.  He did have 1 episode of vomiting over the last 2 weeks from the headache   Observations/Objective: Patient sounds cheerful and well on the phone. I do not appreciate any SOB. Speech and thought processing are grossly intact. Patient reported vitals:  Assessment and Plan: 1. Acute nonintractable headache, unspecified headache type - Will have him try using Excedrin today and will bring him into the office tomorrow morning for further evaluation    Follow Up Instructions:     [id number] 5-10 [id number] 11-20 9443 21-30 I did not refer this patient for an OV in the next 24 hours for this/these issue(s).  I discussed the assessment and treatment  plan with the patient. The patient was provided an opportunity to ask questions and all were answered. The patient agreed with the plan and demonstrated an understanding of the instructions.   The patient was advised to call back or seek an in-person evaluation if the symptoms worsen or if the condition fails to improve as anticipated.  I provided 14 minutes of non-face-to-face time during this encounter.   Dorothyann Peng, NP

## 2021-11-22 NOTE — Telephone Encounter (Signed)
Pt called back, I recited message on Chart and Pt agreed but like a phone call, not a video visit at [place].   [phone]

## 2021-11-23 ENCOUNTER — Ambulatory Visit (INDEPENDENT_AMBULATORY_CARE_PROVIDER_SITE_OTHER): Payer: Medicare HMO | Admitting: Adult Health

## 2021-11-23 VITALS — BP 120/80 | HR 74 | Temp 98.4°F | Ht 78.5 in | Wt 244.0 lb

## 2021-11-23 DIAGNOSIS — R519 Headache, unspecified: Secondary | ICD-10-CM | POA: Diagnosis not present

## 2021-11-23 DIAGNOSIS — R2681 Unsteadiness on feet: Secondary | ICD-10-CM

## 2021-11-23 LAB — POCT URINALYSIS DIPSTICK
Bilirubin, UA: NEGATIVE
Blood, UA: POSITIVE
Glucose, UA: NEGATIVE
Ketones, UA: POSITIVE
Nitrite, UA: POSITIVE
Protein, UA: POSITIVE — AB
Spec Grav, UA: >=1.03 — AB (ref 1.010–1.025)
Urobilinogen, UA: 0.2 E.U./dL
pH, UA: 6 (ref 5.0–8.0)

## 2021-11-23 NOTE — Progress Notes (Signed)
Subjective:    Patient ID: Brandon [name], male    DOB: Nov 02, 1945, 76 y.o.   MRN: [id number]  HPI 76 year old male who  has a past medical history of Allergy, Basal cell carcinoma, GERD (gastroesophageal reflux disease), History of kidney cancer, and Hypertension.  He presents to the office today for for the complaint of a headache.   We spoke yesterday during a phone call visit at which time he informed me that for the last two weeks he had a pretty constant frontal headache. At home he had tried tylenol and ASA which made the headache worse.   He has not experienced any blurred vision, sinus congestion, facial droop, or slurred speech.   We decided to have him try Excedrin and come in the next day for a better evaluation.   Today he reports that he used Excedrin yesterday evening and it resolved his headache.  He has not had a headache since taking the medication.  His wife who is with him today endorses another issue that he has been experiencing.  He and his wife report that occasionally he will be walking and his "legs just give out".  Thankfully he has not fallen to the ground when this happens as he has been in a place where his wife can catch him or he can hold onto an object.  He also reports that this is happened while in bed and states "it is almost like my legs did not want to work and I could not get out of bed".  He has not experienced any numbness or tingling in his lower extremities   He has been through physical therapy gait instability and did well with this.  He was actually released early the last time he went.    Review of Systems See HPI   Past Medical History:  Diagnosis Date   Allergy    Basal cell carcinoma    GERD (gastroesophageal reflux disease)    History of kidney cancer    benign; told not cancerous   Hypertension     Social History   Socioeconomic History   Marital status: Married    Spouse name: Not on file   Number of children: Not on  file   Years of education: Not on file   Highest education level: Bachelor's degree (e.g., BA, AB, BS)  Occupational History   Not on file  Tobacco Use   Smoking status: Former   Smokeless tobacco: Never  Substance and Sexual Activity   Alcohol use: Yes    Alcohol/week: 0.0 standard drinks of alcohol    Comment: "Beer every once in a while"   Drug use: No   Sexual activity: Not on file  Other Topics Concern   Not on file  Social History Narrative   Retired from [employer]   Married    Three children (Two in [place] and one in [place])    Social Determinants of Health   Financial Resource Strain: Low Risk  (11/23/2021)   Overall Financial Resource Strain (CARDIA)    Difficulty of Paying Living Expenses: Not hard at all  Food Insecurity: No Food Insecurity (11/23/2021)   Hunger Vital Sign    Worried About Running Out of Food in the Last Year: Never true    [place] in the Last Year: Never true  Transportation Needs: No Transportation Needs (11/23/2021)   PRAPARE - Hydrologist (Medical): No  Lack of Transportation (Non-Medical): No  Physical Activity: Insufficiently Active (11/23/2021)   Exercise Vital Sign    Days of Exercise per Week: 1 day    Minutes of Exercise per Session: 10 min  Stress: No Stress Concern Present (11/23/2021)   [place]    Feeling of Stress : Not at all  Social Connections: Unknown (11/23/2021)   Social Connection and Isolation Panel [NHANES]    Frequency of Communication with Friends and Family: Once a week    Frequency of Social Gatherings with Friends and Family: Patient refused    Attends Religious Services: 1 to 4 times per year    Active Member of [employer] or Organizations: No    Attends Music therapist: Patient refused    Marital Status: Married  Recent Concern: Social Connections - Socially Isolated (11/02/2021)   Social Connection  and Isolation Panel [NHANES]    Frequency of Communication with Friends and Family: Once a week    Frequency of Social Gatherings with Friends and Family: Once a week    Attends Religious Services: Never    Marine scientist or Organizations: No    Attends Archivist Meetings: Patient refused    Marital Status: Married  Human resources officer Violence: Not At Risk (11/02/2021)   Humiliation, Afraid, Rape, and Kick questionnaire    Fear of Current or Ex-Partner: No    Emotionally Abused: No    Physically Abused: No    Sexually Abused: No    Past Surgical History:  Procedure Laterality Date   CATARACT EXTRACTION Bilateral    HERNIA REPAIR     x2   KIDNEY SURGERY Right    Had right kidney removed.    TONSILLECTOMY     VASECTOMY      Family History  Problem Relation Age of Onset   Cancer Mother        Breast    Cancer Father        Lung    Colon cancer Neg Hx     No Known Allergies  Current Outpatient Medications on File Prior to Visit  Medication Sig Dispense Refill   atorvastatin (LIPITOR) 20 MG tablet Take 1 tablet (20 mg total) by mouth daily. 90 tablet 3   b complex vitamins capsule Take 1 capsule by mouth daily.     cholecalciferol (VITAMIN D) 1000 units tablet Take 1,000 Units by mouth daily.     finasteride (PROSCAR) 5 MG tablet      FLUZONE HIGH-DOSE QUADRIVALENT 0.7 ML SUSY      latanoprost (XALATAN) 0.005 % ophthalmic solution SMARTSIG:In Eye(s)     lisinopril-hydrochlorothiazide (ZESTORETIC) 20-25 MG tablet Take 1 tablet by mouth daily. 90 tablet 3   SHINGRIX injection      tamsulosin (FLOMAX) 0.4 MG CAPS capsule Take 1 capsule (0.4 mg total) by mouth daily. (Patient taking differently: Take 0.4 mg by mouth 2 (two) times daily.) 90 capsule 2   vitamin C (ASCORBIC ACID) 500 MG tablet      No current facility-administered medications on file prior to visit.    BP 120/80   Pulse 74   Temp 98.4 F (36.9 C) (Oral)   Ht 6' 6.5" (1.994 m)   Wt 244 lb  (110.7 kg)   SpO2 98%   BMI 27.84 kg/m       Objective:   Physical Exam Vitals and nursing note reviewed.  Constitutional:      Appearance: Normal  appearance.  HENT:     Right Ear: Tympanic membrane, ear canal and external ear normal. There is no impacted cerumen.     Left Ear: Tympanic membrane, ear canal and external ear normal. There is no impacted cerumen.     Nose: Nose normal.     Mouth/Throat:     Mouth: Mucous membranes are dry.  Eyes:     Extraocular Movements: Extraocular movements intact.     Conjunctiva/sclera: Conjunctivae normal.     Pupils: Pupils are equal, round, and reactive to light.  Cardiovascular:     Rate and Rhythm: Normal rate and regular rhythm.     Pulses: Normal pulses.     Heart sounds: Normal heart sounds.  Pulmonary:     Effort: Pulmonary effort is normal.     Breath sounds: Normal breath sounds.  Musculoskeletal:        General: Normal range of motion.  Skin:    General: Skin is warm and dry.     Capillary Refill: Capillary refill takes less than 2 seconds.  Neurological:     General: No focal deficit present.     Mental Status: He is alert and oriented to person, place, and time.     Cranial Nerves: Cranial nerves 2-12 are intact.     Motor: Motor function is intact.     Coordination: Coordination is intact.     Gait: Gait is intact.  Psychiatric:        Mood and Affect: Mood normal.        Behavior: Behavior normal.        Thought Content: Thought content normal.        Judgment: Judgment normal.        Assessment & Plan:  1. Frontal headache - resolved with Excedrin  - Normal exam   2. Gait instability  - Ambulatory referral to Neurology  Dorothyann Peng, NP

## 2021-11-24 ENCOUNTER — Encounter: Payer: Self-pay | Admitting: Neurology

## 2021-12-11 ENCOUNTER — Telehealth: Payer: Self-pay | Admitting: Adult Health

## 2021-12-11 ENCOUNTER — Encounter: Payer: Self-pay | Admitting: Adult Health

## 2021-12-11 NOTE — Telephone Encounter (Signed)
Pt had a fall last night in bathroom and has cut on his head and then  had another fall  again today while getting dress and per Tillie Rung he needs to go to ED. Pt said ok he asked which one I told him he can [facility], [place], [facility] etc

## 2021-12-11 NOTE — Telephone Encounter (Signed)
FYI

## 2021-12-11 NOTE — Telephone Encounter (Signed)
Tried to call pt earlier 2 times but pt could not here me. Pt called back and spoke to the front office eho advised pt had been schduled for a  V V. Advised front office staff that pt needs to be seen at the ED [facility]. [place] office staff informed him of update.

## 2021-12-11 NOTE — Telephone Encounter (Signed)
Pt called in stating they were told to schedule an appointment for 4pm. Advised that his PCP does not work on Mondays and the next 4pm was on 12/12/21. Patient went to look for message stating he needed this appointment, line still active but silent, waited and disconnected call.

## 2021-12-24 ENCOUNTER — Emergency Department (HOSPITAL_COMMUNITY): Payer: Medicare HMO

## 2021-12-24 ENCOUNTER — Inpatient Hospital Stay (HOSPITAL_COMMUNITY)
Admission: EM | Admit: 2021-12-24 | Discharge: 2021-12-30 | DRG: 026 | Disposition: A | Discharge status: Rehabilitation Facility | Payer: Medicare HMO | Attending: Neurosurgery | Admitting: Neurosurgery

## 2021-12-24 DIAGNOSIS — R000 Tachycardia, unspecified: Secondary | ICD-10-CM | POA: Diagnosis not present

## 2021-12-24 DIAGNOSIS — G8194 Hemiplegia, unspecified affecting left nondominant side: Secondary | ICD-10-CM | POA: Diagnosis not present

## 2021-12-24 DIAGNOSIS — S0003XA Contusion of scalp, initial encounter: Secondary | ICD-10-CM | POA: Diagnosis not present

## 2021-12-24 DIAGNOSIS — Z85828 Personal history of other malignant neoplasm of skin: Secondary | ICD-10-CM | POA: Diagnosis not present

## 2021-12-24 DIAGNOSIS — S0101XA Laceration without foreign body of scalp, initial encounter: Secondary | ICD-10-CM | POA: Diagnosis not present

## 2021-12-24 DIAGNOSIS — I6201 Nontraumatic acute subdural hemorrhage: Secondary | ICD-10-CM

## 2021-12-24 DIAGNOSIS — Z8546 Personal history of malignant neoplasm of prostate: Secondary | ICD-10-CM | POA: Diagnosis not present

## 2021-12-24 DIAGNOSIS — K219 Gastro-esophageal reflux disease without esophagitis: Secondary | ICD-10-CM | POA: Diagnosis present

## 2021-12-24 DIAGNOSIS — I6529 Occlusion and stenosis of unspecified carotid artery: Secondary | ICD-10-CM | POA: Diagnosis not present

## 2021-12-24 DIAGNOSIS — Z9181 History of falling: Secondary | ICD-10-CM

## 2021-12-24 DIAGNOSIS — S065XAA Traumatic subdural hemorrhage with loss of consciousness status unknown, initial encounter: Secondary | ICD-10-CM | POA: Diagnosis not present

## 2021-12-24 DIAGNOSIS — R414 Neurologic neglect syndrome: Secondary | ICD-10-CM | POA: Diagnosis present

## 2021-12-24 DIAGNOSIS — S06A0XA Traumatic brain compression without herniation, initial encounter: Secondary | ICD-10-CM | POA: Diagnosis present

## 2021-12-24 DIAGNOSIS — D696 Thrombocytopenia, unspecified: Secondary | ICD-10-CM | POA: Diagnosis not present

## 2021-12-24 DIAGNOSIS — H9190 Unspecified hearing loss, unspecified ear: Secondary | ICD-10-CM | POA: Diagnosis present

## 2021-12-24 DIAGNOSIS — F05 Delirium due to known physiological condition: Secondary | ICD-10-CM | POA: Diagnosis not present

## 2021-12-24 DIAGNOSIS — R2689 Other abnormalities of gait and mobility: Secondary | ICD-10-CM | POA: Diagnosis present

## 2021-12-24 DIAGNOSIS — Z79899 Other long term (current) drug therapy: Secondary | ICD-10-CM | POA: Diagnosis not present

## 2021-12-24 DIAGNOSIS — S0093XA Contusion of unspecified part of head, initial encounter: Secondary | ICD-10-CM

## 2021-12-24 DIAGNOSIS — I6200 Nontraumatic subdural hemorrhage, unspecified: Secondary | ICD-10-CM | POA: Diagnosis not present

## 2021-12-24 DIAGNOSIS — S199XXA Unspecified injury of neck, initial encounter: Secondary | ICD-10-CM | POA: Diagnosis not present

## 2021-12-24 DIAGNOSIS — Z7983 Long term (current) use of bisphosphonates: Secondary | ICD-10-CM | POA: Diagnosis not present

## 2021-12-24 DIAGNOSIS — M47813 Spondylosis without myelopathy or radiculopathy, cervicothoracic region: Secondary | ICD-10-CM | POA: Diagnosis not present

## 2021-12-24 DIAGNOSIS — C61 Malignant neoplasm of prostate: Secondary | ICD-10-CM | POA: Diagnosis not present

## 2021-12-24 DIAGNOSIS — Z23 Encounter for immunization: Secondary | ICD-10-CM | POA: Diagnosis not present

## 2021-12-24 DIAGNOSIS — S066X0A Traumatic subarachnoid hemorrhage without loss of consciousness, initial encounter: Secondary | ICD-10-CM | POA: Diagnosis not present

## 2021-12-24 DIAGNOSIS — G4700 Insomnia, unspecified: Secondary | ICD-10-CM | POA: Diagnosis not present

## 2021-12-24 DIAGNOSIS — N3941 Urge incontinence: Secondary | ICD-10-CM | POA: Diagnosis not present

## 2021-12-24 DIAGNOSIS — Z801 Family history of malignant neoplasm of trachea, bronchus and lung: Secondary | ICD-10-CM

## 2021-12-24 DIAGNOSIS — Z87891 Personal history of nicotine dependence: Secondary | ICD-10-CM | POA: Diagnosis not present

## 2021-12-24 DIAGNOSIS — M170 Bilateral primary osteoarthritis of knee: Secondary | ICD-10-CM | POA: Diagnosis not present

## 2021-12-24 DIAGNOSIS — S065X0A Traumatic subdural hemorrhage without loss of consciousness, initial encounter: Secondary | ICD-10-CM | POA: Diagnosis not present

## 2021-12-24 DIAGNOSIS — N401 Enlarged prostate with lower urinary tract symptoms: Secondary | ICD-10-CM | POA: Diagnosis not present

## 2021-12-24 DIAGNOSIS — E876 Hypokalemia: Secondary | ICD-10-CM | POA: Diagnosis not present

## 2021-12-24 DIAGNOSIS — M47812 Spondylosis without myelopathy or radiculopathy, cervical region: Secondary | ICD-10-CM | POA: Diagnosis not present

## 2021-12-24 DIAGNOSIS — K30 Functional dyspepsia: Secondary | ICD-10-CM | POA: Diagnosis not present

## 2021-12-24 DIAGNOSIS — Z803 Family history of malignant neoplasm of breast: Secondary | ICD-10-CM

## 2021-12-24 DIAGNOSIS — K5900 Constipation, unspecified: Secondary | ICD-10-CM | POA: Diagnosis not present

## 2021-12-24 DIAGNOSIS — S069X9S Unspecified intracranial injury with loss of consciousness of unspecified duration, sequela: Secondary | ICD-10-CM | POA: Diagnosis not present

## 2021-12-24 DIAGNOSIS — R296 Repeated falls: Secondary | ICD-10-CM | POA: Diagnosis not present

## 2021-12-24 DIAGNOSIS — W06XXXA Fall from bed, initial encounter: Secondary | ICD-10-CM | POA: Diagnosis present

## 2021-12-24 DIAGNOSIS — I10 Essential (primary) hypertension: Secondary | ICD-10-CM

## 2021-12-24 DIAGNOSIS — I2510 Atherosclerotic heart disease of native coronary artery without angina pectoris: Secondary | ICD-10-CM | POA: Diagnosis not present

## 2021-12-24 DIAGNOSIS — R58 Hemorrhage, not elsewhere classified: Secondary | ICD-10-CM | POA: Diagnosis not present

## 2021-12-24 DIAGNOSIS — R410 Disorientation, unspecified: Secondary | ICD-10-CM | POA: Diagnosis not present

## 2021-12-24 DIAGNOSIS — S069X9D Unspecified intracranial injury with loss of consciousness of unspecified duration, subsequent encounter: Secondary | ICD-10-CM | POA: Diagnosis not present

## 2021-12-24 DIAGNOSIS — I6203 Nontraumatic chronic subdural hemorrhage: Secondary | ICD-10-CM

## 2021-12-24 DIAGNOSIS — S069X9A Unspecified intracranial injury with loss of consciousness of unspecified duration, initial encounter: Secondary | ICD-10-CM | POA: Diagnosis not present

## 2021-12-24 DIAGNOSIS — R451 Restlessness and agitation: Secondary | ICD-10-CM | POA: Diagnosis not present

## 2021-12-24 DIAGNOSIS — R4189 Other symptoms and signs involving cognitive functions and awareness: Secondary | ICD-10-CM | POA: Diagnosis not present

## 2021-12-24 DIAGNOSIS — R509 Fever, unspecified: Secondary | ICD-10-CM | POA: Diagnosis not present

## 2021-12-24 DIAGNOSIS — W010XXA Fall on same level from slipping, tripping and stumbling without subsequent striking against object, initial encounter: Principal | ICD-10-CM

## 2021-12-24 DIAGNOSIS — Z9889 Other specified postprocedural states: Secondary | ICD-10-CM | POA: Diagnosis not present

## 2021-12-24 DIAGNOSIS — G4489 Other headache syndrome: Secondary | ICD-10-CM | POA: Diagnosis not present

## 2021-12-24 DIAGNOSIS — S065XAS Traumatic subdural hemorrhage with loss of consciousness status unknown, sequela: Secondary | ICD-10-CM | POA: Diagnosis not present

## 2021-12-24 DIAGNOSIS — Z298 Encounter for other specified prophylactic measures: Secondary | ICD-10-CM | POA: Diagnosis not present

## 2021-12-24 DIAGNOSIS — I6202 Nontraumatic subacute subdural hemorrhage: Secondary | ICD-10-CM | POA: Diagnosis not present

## 2021-12-24 LAB — BASIC METABOLIC PANEL
Anion gap: 9 (ref 5–15)
BUN: 19 mg/dL (ref 8–23)
CO2: 23 mmol/L (ref 22–32)
Calcium: 9 mg/dL (ref 8.9–10.3)
Chloride: 106 mmol/L (ref 98–111)
Creatinine, Ser: 1.03 mg/dL (ref 0.61–1.24)
GFR, Estimated: >60 mL/min (ref >60)
Glucose, Bld: 166 mg/dL — ABNORMAL HIGH (ref 70–99)
Potassium: 3.7 mmol/L (ref 3.5–5.1)
Sodium: 138 mmol/L (ref 135–145)

## 2021-12-24 LAB — CBC
HCT: 39.6 % (ref 39.0–52.0)
Hemoglobin: 13.8 g/dL (ref 13.0–17.0)
MCH: 30.6 pg (ref 26.0–34.0)
MCHC: 34.8 g/dL (ref 30.0–36.0)
MCV: 87.8 fL (ref 80.0–100.0)
Platelets: 128 10*3/uL — ABNORMAL LOW (ref 150–400)
RBC: 4.51 MIL/uL (ref 4.22–5.81)
RDW: 13.2 % (ref 11.5–15.5)
WBC: 6.5 10*3/uL (ref 4.0–10.5)
nRBC: 0 % (ref 0.0–0.2)

## 2021-12-24 MED ORDER — ONDANSETRON HCL 4 MG/2ML IJ SOLN
4.0000 mg | Freq: Once | INTRAMUSCULAR | Status: AC
  Administered 2021-12-24: 4 mg via INTRAVENOUS
  Filled 2021-12-24: qty 2

## 2021-12-24 MED ORDER — LIDOCAINE-EPINEPHRINE (PF) 2 %-1:200000 IJ SOLN
20.0000 mL | Freq: Once | INTRAMUSCULAR | Status: AC
  Administered 2021-12-24: 20 mL
  Filled 2021-12-24: qty 20

## 2021-12-24 MED ORDER — TETANUS-DIPHTH-ACELL PERTUSSIS 5-2.5-18.5 LF-MCG/0.5 IM SUSY
0.5000 mL | PREFILLED_SYRINGE | Freq: Once | INTRAMUSCULAR | Status: AC
Start: 2021-12-24 — End: 2021-12-24
  Administered 2021-12-24: 0.5 mL via INTRAMUSCULAR
  Filled 2021-12-24: qty 0.5

## 2021-12-24 NOTE — ED Triage Notes (Signed)
Pt bib [facility] from home after unwitnessed fall out of bed. Hematoma noted to back of head, no thinners. Frequent falls past month, axo x2 with EMS.   BP 160/10 HR 80 SPO2 96% RA CBG 118

## 2021-12-24 NOTE — H&P (Signed)
Brandon [name] is an 76 y.o. male.   HPI:  76 year old male presetned to the ED today after sustaining multiple falls over the last couple weeks. Wife at bedside and is a good historian. Patient is very hard of hearing so difficult to history from him. Denies any blood thinners. Reports some mild headaches but no NV or dizziness. Wife states he has had about 5 falls in the last 4 weeks. She has made a neurology appt for him but this is not until November.    Past Medical History:  Diagnosis Date   Allergy    Basal cell carcinoma    GERD (gastroesophageal reflux disease)    History of kidney cancer    benign; told not cancerous   Hypertension     Past Surgical History:  Procedure Laterality Date   CATARACT EXTRACTION Bilateral    HERNIA REPAIR     x2   KIDNEY SURGERY Right    Had right kidney removed.    TONSILLECTOMY     VASECTOMY      No Known Allergies  Social History   Tobacco Use   Smoking status: Former   Smokeless tobacco: Never  Substance Use Topics   Alcohol use: Yes    Alcohol/week: 0.0 standard drinks of alcohol    Comment: "Beer every once in a while"    Family History  Problem Relation Age of Onset   Cancer Mother        Breast    Cancer Father        Lung    Colon cancer Neg Hx      Review of Systems  Positive ROS: as above  All other systems have been reviewed and were otherwise negative with the exception of those mentioned in the HPI and as above.  Objective: Vital signs in last 24 hours: Temp:  [97.4 F (36.3 C)] 97.4 F (36.3 C) (08/13 2034) Pulse Rate:  [66-73] 66 (08/13 2245) Resp:  [11-17] 14 (08/13 2245) BP: (146-164)/(75-101) 146/75 (08/13 2245) SpO2:  [92 %-98 %] 92 % (08/13 2245)  General Appearance: Alert, cooperative, no distress, appears stated age Head: Normocephalic, without obvious abnormality, atraumatic Eyes: PERRL, conjunctiva/corneas clear, EOM's intact, fundi benign, both eyes      Lungs:  respirations  unlabored Heart: Regular rate and rhythm, Extremities: Extremities normal, atraumatic, no cyanosis or edema Pulses: 2+ and symmetric all extremities Skin: Skin color, texture, turgor normal, no rashes or lesions  NEUROLOGIC:   Mental status: A&O x4, no aphasia, good attention span, Memory and fund of knowledge Motor Exam -  left arm weakness with pronator drift Sensory Exam - grossly normal Reflexes: symmetric, no pathologic reflexes, No Hoffman's, No clonus Coordination - grossly normal Gait - not tested Balance - not tested Cranial Nerves: I: smell Not tested  II: visual acuity  OS: na    OD: na  II: visual fields Full to confrontation  II: pupils Equal, round, reactive to light  III,VII: ptosis None  III,IV,VI: extraocular muscles  Full ROM  V: mastication Normal  V: facial light touch sensation  Normal  V,VII: corneal reflex  Present  VII: facial muscle function - upper  Normal  VII: facial muscle function - lower Normal  VIII: hearing Not tested  IX: soft palate elevation  Normal  IX,X: gag reflex Present  XI: trapezius strength  5/5  XI: sternocleidomastoid strength 5/5  XI: neck flexion strength  5/5  XII: tongue strength  Normal  Data Review Lab Results  Component Value Date   WBC 6.5 12/24/2021   HGB 13.8 12/24/2021   HCT 39.6 12/24/2021   MCV 87.8 12/24/2021   PLT 128 (L) 12/24/2021   Lab Results  Component Value Date   NA 138 12/24/2021   K 3.7 12/24/2021   CL 106 12/24/2021   CO2 23 12/24/2021   BUN 19 12/24/2021   CREATININE 1.03 12/24/2021   GLUCOSE 166 (H) 12/24/2021   No results found for: "INR", "PROTIME"  Radiology: CT Head Wo Contrast  Result Date: 12/24/2021 CLINICAL DATA:  Initial evaluation for acute trauma. EXAM: CT HEAD WITHOUT CONTRAST CT CERVICAL SPINE WITHOUT CONTRAST TECHNIQUE: Multidetector CT imaging of the head and cervical spine was performed following the standard protocol without intravenous contrast. Multiplanar CT image  reconstructions of the cervical spine were also generated. RADIATION DOSE REDUCTION: This exam was performed according to the departmental dose-optimization program which includes automated exposure control, adjustment of the mA and/or kV according to patient size and/or use of iterative reconstruction technique. COMPARISON:  None Available. FINDINGS: CT HEAD FINDINGS Brain: Large acute on subacute to chronic bilateral subdural hematomas are seen. Collection on the right measures up to 2.5 cm in diameter, with the collection on the left measuring up to 1.5 cm in diameter. Associated mass effect with 5 mm of right-to-left shift. Acute subdural blood seen extending along the falx and tentorium. Probable small associated subarachnoid component at the mid left frontal region (series 3, image 27). No other parenchymal or intra ventricular hemorrhage. No hydrocephalus or trapping. Basilar cisterns remain patent. No visible acute large vessel territory infarct or mass. Underlying mild chronic microvascular ischemic disease. Vascular: No hyperdense vessel. Calcified atherosclerosis present at the skull base. Skull: Soft tissue contusion with laceration at the left occipital scalp. No calvarial fracture. Sinuses/Orbits: Globes orbital soft tissues within normal limits. Paranasal sinuses and mastoid air cells are clear. Other: None. CT CERVICAL SPINE FINDINGS Alignment: Straightening of the normal cervical lordosis. No listhesis or malalignment. Skull base and vertebrae: Skull base intact. Normal C1-2 articulations are preserved in the dens is intact. Vertebral body heights maintained. No acute fracture. Soft tissues and spinal canal: Soft tissues of the neck demonstrate no acute finding. No abnormal prevertebral edema. Moderate atheromatous change about the carotid bifurcations. Spinal canal demonstrates no visible acute finding. Disc levels: Moderate degenerative spondylosis seen C3-4 through C7-T1. Upper chest: Small focus  of ground-glass opacity at the mid right lung apex, nonspecific, but possibly a small focus of mild pneumonitis (series 4, image 105). Underlying mild emphysema. Visualized upper chest demonstrates no other acute finding. Other: None. IMPRESSION: CT BRAIN: 1. Large acute on subacute to chronic bilateral subdural hematomas, measuring up to 2.5 cm on the right and 1.5 cm on the left. Extension along the falx and tentorium. Associated mass effect with up to 5 mm of right-to-left shift. 2. Soft tissue contusion with laceration at the left occipital scalp. No calvarial fracture. 3. Underlying mild chronic microvascular ischemic disease. CT CERVICAL SPINE: 1. No acute traumatic injury within the cervical spine. 2. Moderate degenerative spondylosis C3-4 through C7-T1. 3. Small focus of ground-glass opacity at the mid right lung apex, nonspecific, but possibly mild pneumonitis. Critical Value/emergent results were called by telephone at the time of interpretation on 12/24/2021 at 10:00 pm to provider [facility] , who verbally acknowledged these results. Electronically Signed   By: Jeannine Boga M.D.   On: 12/24/2021 22:05   CT Cervical Spine Wo Contrast  Result Date: 12/24/2021 CLINICAL DATA:  Initial evaluation for acute trauma. EXAM: CT HEAD WITHOUT CONTRAST CT CERVICAL SPINE WITHOUT CONTRAST TECHNIQUE: Multidetector CT imaging of the head and cervical spine was performed following the standard protocol without intravenous contrast. Multiplanar CT image reconstructions of the cervical spine were also generated. RADIATION DOSE REDUCTION: This exam was performed according to the departmental dose-optimization program which includes automated exposure control, adjustment of the mA and/or kV according to patient size and/or use of iterative reconstruction technique. COMPARISON:  None Available. FINDINGS: CT HEAD FINDINGS Brain: Large acute on subacute to chronic bilateral subdural hematomas are seen. Collection on  the right measures up to 2.5 cm in diameter, with the collection on the left measuring up to 1.5 cm in diameter. Associated mass effect with 5 mm of right-to-left shift. Acute subdural blood seen extending along the falx and tentorium. Probable small associated subarachnoid component at the mid left frontal region (series 3, image 27). No other parenchymal or intra ventricular hemorrhage. No hydrocephalus or trapping. Basilar cisterns remain patent. No visible acute large vessel territory infarct or mass. Underlying mild chronic microvascular ischemic disease. Vascular: No hyperdense vessel. Calcified atherosclerosis present at the skull base. Skull: Soft tissue contusion with laceration at the left occipital scalp. No calvarial fracture. Sinuses/Orbits: Globes orbital soft tissues within normal limits. Paranasal sinuses and mastoid air cells are clear. Other: None. CT CERVICAL SPINE FINDINGS Alignment: Straightening of the normal cervical lordosis. No listhesis or malalignment. Skull base and vertebrae: Skull base intact. Normal C1-2 articulations are preserved in the dens is intact. Vertebral body heights maintained. No acute fracture. Soft tissues and spinal canal: Soft tissues of the neck demonstrate no acute finding. No abnormal prevertebral edema. Moderate atheromatous change about the carotid bifurcations. Spinal canal demonstrates no visible acute finding. Disc levels: Moderate degenerative spondylosis seen C3-4 through C7-T1. Upper chest: Small focus of ground-glass opacity at the mid right lung apex, nonspecific, but possibly a small focus of mild pneumonitis (series 4, image 105). Underlying mild emphysema. Visualized upper chest demonstrates no other acute finding. Other: None. IMPRESSION: CT BRAIN: 1. Large acute on subacute to chronic bilateral subdural hematomas, measuring up to 2.5 cm on the right and 1.5 cm on the left. Extension along the falx and tentorium. Associated mass effect with up to 5 mm  of right-to-left shift. 2. Soft tissue contusion with laceration at the left occipital scalp. No calvarial fracture. 3. Underlying mild chronic microvascular ischemic disease. CT CERVICAL SPINE: 1. No acute traumatic injury within the cervical spine. 2. Moderate degenerative spondylosis C3-4 through C7-T1. 3. Small focus of ground-glass opacity at the mid right lung apex, nonspecific, but possibly mild pneumonitis. Critical Value/emergent results were called by telephone at the time of interpretation on 12/24/2021 at 10:00 pm to provider [facility] , who verbally acknowledged these results. Electronically Signed   By: Jeannine Boga M.D.   On: 12/24/2021 22:05    Assessment/Plan: 76 year old male presented to the ED with multiple falls over the last month. CT head shows large bilateral SDH that are acute, subacute and chronic with some slight midline shift. Patient is alert and orientedx4 and resting comfortably in bed. We will plan for bilateral craniotomy for evacuation of SDH tomorrow late afternoon. Please call with any neuro changes. No blood thinners and NPO after midnight. Discussed the plan of care with his wife and she understood. We will admit him to the ICU overnight.    Brandon [name] 12/24/2021 11:00 PM

## 2021-12-24 NOTE — ED Provider Notes (Signed)
[facility] EMERGENCY DEPARTMENT Provider Note   CSN: [id number] Arrival date & time: 12/24/21  2028     History  Chief Complaint  Patient presents with   Lytle Michaels    DEMETRUS PAVAO is a 76 y.o. male.  Patient presents s/p fall. Pt slid/fell from bed. Hit head. Laceration to scalp. Tetanus unknown. Denies loc. Dull pain to head/area of contusion. Denies neck/back pain or radicular pain. No numbness/weakness. No faintness or dizziness prior to fall. No chest pain or discomfort. No sob. No abd pain or nv. No extremity pain or injury. No anticoag use. Pt seems mildly confused.  Spouse present - indicates recurrent falls/gait instability, and periods confusion/memory issues in past 5-6 months. Spouse confirms no anticoag use.   The history is provided by the patient, medical records and the EMS personnel.  Fall Pertinent negatives include no chest pain, no abdominal pain and no shortness of breath.       Home Medications Prior to Admission medications   Medication Sig Start Date End Date Taking? Authorizing Provider  atorvastatin (LIPITOR) 20 MG tablet Take 1 tablet (20 mg total) by mouth daily. 10/18/21   Nafziger, Tommi Rumps, NP  b complex vitamins capsule Take 1 capsule by mouth daily.    [provider]  cholecalciferol (VITAMIN D) 1000 units tablet Take 1,000 Units by mouth daily. 12/20/16   [provider]  finasteride (PROSCAR) 5 MG tablet  12/10/17   [provider]  FLUZONE HIGH-DOSE QUADRIVALENT 0.7 ML SUSY  02/10/19   [provider]  latanoprost (XALATAN) 0.005 % ophthalmic solution SMARTSIG:In Eye(s) 10/02/21   [provider]  lisinopril-hydrochlorothiazide (ZESTORETIC) 20-25 MG tablet Take 1 tablet by mouth daily. 10/18/21   Dorothyann Peng, NP  [facility] injection  02/10/19   [provider]  tamsulosin (FLOMAX) 0.4 MG CAPS capsule Take 1 capsule (0.4 mg total) by mouth daily. Patient taking differently: Take 0.4 mg  by mouth 2 (two) times daily. 04/18/17   Nafziger, Tommi Rumps, NP  vitamin C (ASCORBIC ACID) 500 MG tablet  12/20/16   [provider]      Allergies    Patient has no known allergies.    Review of Systems   Review of Systems  Constitutional:  Negative for fever.  HENT:  Negative for sore throat.   Eyes:  Negative for visual disturbance.  Respiratory:  Negative for shortness of breath.   Cardiovascular:  Negative for chest pain.  Gastrointestinal:  Negative for abdominal pain, blood in stool and vomiting.  Genitourinary:  Negative for dysuria and flank pain.  Musculoskeletal:  Negative for back pain and neck pain.  Skin:  Positive for wound. Negative for rash.  Neurological:  Negative for weakness and numbness.  Hematological:  Does not bruise/bleed easily.  Psychiatric/Behavioral:  Positive for confusion.     Physical Exam Updated Vital Signs BP (!) 164/100 (BP Location: Right Arm)   Pulse 73   Temp (!) 97.4 F (36.3 C) (Oral)   Resp 11   SpO2 94%  Physical Exam Vitals and nursing note reviewed.  Constitutional:      Appearance: Normal appearance. He is well-developed.  HENT:     Head:     Comments: Contusion/lac to posterior scalp, 6 cm laceration. Slow, persistent oozing of blood from wound.     Nose: Nose normal.     Mouth/Throat:     Mouth: Mucous membranes are moist.     Pharynx: Oropharynx is clear.  Eyes:  General: No scleral icterus.    Conjunctiva/sclera: Conjunctivae normal.     Pupils: Pupils are equal, round, and reactive to light.  Neck:     Vascular: No carotid bruit.     Trachea: No tracheal deviation.  Cardiovascular:     Rate and Rhythm: Normal rate and regular rhythm.     Pulses: Normal pulses.     Heart sounds: Normal heart sounds. No murmur heard.    No friction rub. No gallop.  Pulmonary:     Effort: Pulmonary effort is normal. No accessory muscle usage or respiratory distress.     Breath sounds: Normal breath sounds.  Abdominal:      General: Bowel sounds are normal. There is no distension.     Palpations: Abdomen is soft. There is no mass.     Tenderness: There is no abdominal tenderness.  Genitourinary:    Comments: No cva tenderness. Musculoskeletal:        General: No swelling.     Cervical back: Normal range of motion and neck supple. No rigidity.     Comments: Mild mid cervical tenderness, otherwise, CTLS spine, non tender, aligned, no step off. Good rom bil extremities without pain or focal bony tenderness.   Skin:    General: Skin is warm and dry.     Findings: No rash.  Neurological:     Mental Status: He is alert.     Comments: Alert, speech clear. GCS 14. Motor/sens grossly intact bil.   Psychiatric:        Mood and Affect: Mood normal.     ED Results / Procedures / Treatments   Labs (all labs ordered are listed, but only abnormal results are displayed) Results for orders placed or performed during the hospital encounter of 62/70/35  Basic metabolic panel  Result Value Ref Range   Sodium 138 135 - 145 mmol/L   Potassium 3.7 3.5 - 5.1 mmol/L   Chloride 106 98 - 111 mmol/L   CO2 23 22 - 32 mmol/L   Glucose, Bld 166 (H) 70 - 99 mg/dL   BUN 19 8 - 23 mg/dL   Creatinine, Ser 1.03 0.61 - 1.24 mg/dL   Calcium 9.0 8.9 - 10.3 mg/dL   GFR, Estimated >60 >60 mL/min   Anion gap 9 5 - 15  CBC  Result Value Ref Range   WBC 6.5 4.0 - 10.5 K/uL   RBC 4.51 4.22 - 5.81 MIL/uL   Hemoglobin 13.8 13.0 - 17.0 g/dL   HCT 39.6 39.0 - 52.0 %   MCV 87.8 80.0 - 100.0 fL   MCH 30.6 26.0 - 34.0 pg   MCHC 34.8 30.0 - 36.0 g/dL   RDW 13.2 11.5 - 15.5 %   Platelets 128 (L) 150 - 400 K/uL   nRBC 0.0 0.0 - 0.2 %     EKG EKG Interpretation  Date/Time:  Sunday December 24 2021 20:34:37 EDT Ventricular Rate:  71 PR Interval:  192 QRS Duration: 117 QT Interval:  433 QTC Calculation: 471 R Axis:   26 Text Interpretation: Sinus rhythm Ventricular premature complex Nonspecific intraventricular conduction delay  Baseline wander No previous tracing Confirmed by Lajean Saver [id number]) on 12/24/2021 8:37:03 PM  Radiology CT Head Wo Contrast  Result Date: 12/24/2021 CLINICAL DATA:  Initial evaluation for acute trauma. EXAM: CT HEAD WITHOUT CONTRAST CT CERVICAL SPINE WITHOUT CONTRAST TECHNIQUE: Multidetector CT imaging of the head and cervical spine was performed following the standard protocol without intravenous contrast. Multiplanar CT image reconstructions  of the cervical spine were also generated. RADIATION DOSE REDUCTION: This exam was performed according to the departmental dose-optimization program which includes automated exposure control, adjustment of the mA and/or kV according to patient size and/or use of iterative reconstruction technique. COMPARISON:  None Available. FINDINGS: CT HEAD FINDINGS Brain: Large acute on subacute to chronic bilateral subdural hematomas are seen. Collection on the right measures up to 2.5 cm in diameter, with the collection on the left measuring up to 1.5 cm in diameter. Associated mass effect with 5 mm of right-to-left shift. Acute subdural blood seen extending along the falx and tentorium. Probable small associated subarachnoid component at the mid left frontal region (series 3, image 27). No other parenchymal or intra ventricular hemorrhage. No hydrocephalus or trapping. Basilar cisterns remain patent. No visible acute large vessel territory infarct or mass. Underlying mild chronic microvascular ischemic disease. Vascular: No hyperdense vessel. Calcified atherosclerosis present at the skull base. Skull: Soft tissue contusion with laceration at the left occipital scalp. No calvarial fracture. Sinuses/Orbits: Globes orbital soft tissues within normal limits. Paranasal sinuses and mastoid air cells are clear. Other: None. CT CERVICAL SPINE FINDINGS Alignment: Straightening of the normal cervical lordosis. No listhesis or malalignment. Skull base and vertebrae: Skull base intact.  Normal C1-2 articulations are preserved in the dens is intact. Vertebral body heights maintained. No acute fracture. Soft tissues and spinal canal: Soft tissues of the neck demonstrate no acute finding. No abnormal prevertebral edema. Moderate atheromatous change about the carotid bifurcations. Spinal canal demonstrates no visible acute finding. Disc levels: Moderate degenerative spondylosis seen C3-4 through C7-T1. Upper chest: Small focus of ground-glass opacity at the mid right lung apex, nonspecific, but possibly a small focus of mild pneumonitis (series 4, image 105). Underlying mild emphysema. Visualized upper chest demonstrates no other acute finding. Other: None. IMPRESSION: CT BRAIN: 1. Large acute on subacute to chronic bilateral subdural hematomas, measuring up to 2.5 cm on the right and 1.5 cm on the left. Extension along the falx and tentorium. Associated mass effect with up to 5 mm of right-to-left shift. 2. Soft tissue contusion with laceration at the left occipital scalp. No calvarial fracture. 3. Underlying mild chronic microvascular ischemic disease. CT CERVICAL SPINE: 1. No acute traumatic injury within the cervical spine. 2. Moderate degenerative spondylosis C3-4 through C7-T1. 3. Small focus of ground-glass opacity at the mid right lung apex, nonspecific, but possibly mild pneumonitis. Critical Value/emergent results were called by telephone at the time of interpretation on 12/24/2021 at 10:00 pm to provider [facility] , who verbally acknowledged these results. Electronically Signed   By: Jeannine Boga M.D.   On: 12/24/2021 22:05   CT Cervical Spine Wo Contrast  Result Date: 12/24/2021 CLINICAL DATA:  Initial evaluation for acute trauma. EXAM: CT HEAD WITHOUT CONTRAST CT CERVICAL SPINE WITHOUT CONTRAST TECHNIQUE: Multidetector CT imaging of the head and cervical spine was performed following the standard protocol without intravenous contrast. Multiplanar CT image reconstructions of  the cervical spine were also generated. RADIATION DOSE REDUCTION: This exam was performed according to the departmental dose-optimization program which includes automated exposure control, adjustment of the mA and/or kV according to patient size and/or use of iterative reconstruction technique. COMPARISON:  None Available. FINDINGS: CT HEAD FINDINGS Brain: Large acute on subacute to chronic bilateral subdural hematomas are seen. Collection on the right measures up to 2.5 cm in diameter, with the collection on the left measuring up to 1.5 cm in diameter. Associated mass effect with 5 mm of right-to-left  shift. Acute subdural blood seen extending along the falx and tentorium. Probable small associated subarachnoid component at the mid left frontal region (series 3, image 27). No other parenchymal or intra ventricular hemorrhage. No hydrocephalus or trapping. Basilar cisterns remain patent. No visible acute large vessel territory infarct or mass. Underlying mild chronic microvascular ischemic disease. Vascular: No hyperdense vessel. Calcified atherosclerosis present at the skull base. Skull: Soft tissue contusion with laceration at the left occipital scalp. No calvarial fracture. Sinuses/Orbits: Globes orbital soft tissues within normal limits. Paranasal sinuses and mastoid air cells are clear. Other: None. CT CERVICAL SPINE FINDINGS Alignment: Straightening of the normal cervical lordosis. No listhesis or malalignment. Skull base and vertebrae: Skull base intact. Normal C1-2 articulations are preserved in the dens is intact. Vertebral body heights maintained. No acute fracture. Soft tissues and spinal canal: Soft tissues of the neck demonstrate no acute finding. No abnormal prevertebral edema. Moderate atheromatous change about the carotid bifurcations. Spinal canal demonstrates no visible acute finding. Disc levels: Moderate degenerative spondylosis seen C3-4 through C7-T1. Upper chest: Small focus of ground-glass  opacity at the mid right lung apex, nonspecific, but possibly a small focus of mild pneumonitis (series 4, image 105). Underlying mild emphysema. Visualized upper chest demonstrates no other acute finding. Other: None. IMPRESSION: CT BRAIN: 1. Large acute on subacute to chronic bilateral subdural hematomas, measuring up to 2.5 cm on the right and 1.5 cm on the left. Extension along the falx and tentorium. Associated mass effect with up to 5 mm of right-to-left shift. 2. Soft tissue contusion with laceration at the left occipital scalp. No calvarial fracture. 3. Underlying mild chronic microvascular ischemic disease. CT CERVICAL SPINE: 1. No acute traumatic injury within the cervical spine. 2. Moderate degenerative spondylosis C3-4 through C7-T1. 3. Small focus of ground-glass opacity at the mid right lung apex, nonspecific, but possibly mild pneumonitis. Critical Value/emergent results were called by telephone at the time of interpretation on 12/24/2021 at 10:00 pm to provider [facility] , who verbally acknowledged these results. Electronically Signed   By: Jeannine Boga M.D.   On: 12/24/2021 22:05    Procedures .Marland KitchenLaceration Repair  Date/Time: 12/24/2021 10:31 PM  Performed by: Lajean Saver, MD Authorized by: Lajean Saver, MD   Anesthesia:    Anesthesia method:  Local infiltration   Local anesthetic:  Lidocaine 2% WITH epi Laceration details:    Location:  Scalp   Scalp location:  Occipital   Length (cm):  6 Pre-procedure details:    Preparation:  Patient was prepped and draped in usual sterile fashion and imaging obtained to evaluate for foreign bodies Exploration:    Contaminated: no   Treatment:    Area cleansed with:  Saline   Amount of cleaning:  Standard   Irrigation solution:  Sterile saline Skin repair:    Repair method:  Staples (pt with bleeding wound, limited ability to assist w position - irrigaged and placed staples to acheive hemostasis.)   Number of staples:   11 Post-procedure details:    Dressing:  Sterile dressing   Procedure completion:  Tolerated well, no immediate complications     Medications Ordered in ED Medications  Tdap (BOOSTRIX) injection 0.5 mL (has no administration in time range)  lidocaine-EPINEPHrine (XYLOCAINE W/EPI) 2 %-1:200000 (PF) injection 20 mL (has no administration in time range)    ED Course/ Medical Decision Making/ A&P  Medical Decision Making Problems Addressed: Acute on chronic intracranial subdural hematoma ([place]): acute illness or injury with systemic symptoms that poses a threat to life or bodily functions Contusion of head, initial encounter: acute illness or injury Essential hypertension: chronic illness or injury that poses a threat to life or bodily functions Fall from slip, trip, or stumble, initial encounter: acute illness or injury with systemic symptoms that poses a threat to life or bodily functions Laceration of scalp, initial encounter: acute illness or injury Thrombocytopenia ([place]): acute illness or injury  Amount and/or Complexity of Data Reviewed Independent Historian: EMS    Details: hx External Data Reviewed: notes. Labs: ordered. Decision-making details documented in ED Course. Radiology: ordered and independent interpretation performed. Decision-making details documented in ED Course. ECG/medicine tests: ordered and independent interpretation performed. Decision-making details documented in ED Course. Discussion of management or test interpretation with external provider(s): neurosurgery  Risk Prescription drug management. Decision regarding hospitalization.  Iv ns. Continuous pulse ox and cardiac monitoring. Labs ordered/sent. Imaging ordered.   Reviewed nursing notes and prior charts for additional history. External reports reviewed. Additional history from: EMS.   Cardiac monitor: sinus rhythm, rate 74.  Labs reviewed/interpreted by me - wbc and hgb  normal.   CT reviewed/interpreted by me - +acute on chronic subdurals - reports called to me by radiology at 2202, Neurosurgery consulted at 2202. Discussed w NS on call, including ct, midline shift, bil sdh, altered ms - will be right in to see.   Recheck pt, no change in exam from prior.   CRITICAL CARE RE: fall with head contusion and bilateral acute on chronic subdural hematoma.  Performed by: Mirna Mires Total critical care time: 115 minutes Critical care time was exclusive of separately billable procedures and treating other patients. Critical care was necessary to treat or prevent imminent or life-threatening deterioration. Critical care was time spent personally by me on the following activities: development of treatment plan with patient and/or surrogate as well as nursing, discussions with consultants, evaluation of patient's response to treatment, examination of patient, obtaining history from patient or surrogate, ordering and performing treatments and interventions, ordering and review of laboratory studies, ordering and review of radiographic studies, pulse oximetry and re-evaluation of patient's condition.  Pt w nausea. Zofran iv.         Final Clinical Impression(s) / ED Diagnoses Final diagnoses:  None    Rx / DC Orders ED Discharge Orders     None         Lajean Saver, MD 12/24/21 2239

## 2021-12-24 NOTE — Discharge Instructions (Signed)
It was our pleasure to provide your ER care today - we hope that you feel better.  Fall precautions - use walker, and great care/caution when up and about to help minimize risk of falling.   Follow up with primary care doctor/neurologist in the next 1-2 weeks.   Return to ER if worse, new symptoms, fevers, new/severe pain, chest pain, trouble breathing, or other concern.

## 2021-12-25 ENCOUNTER — Encounter (HOSPITAL_COMMUNITY): Payer: Self-pay | Admitting: Neurosurgery

## 2021-12-25 ENCOUNTER — Inpatient Hospital Stay (HOSPITAL_COMMUNITY): Payer: Medicare HMO | Admitting: Anesthesiology

## 2021-12-25 ENCOUNTER — Inpatient Hospital Stay (HOSPITAL_COMMUNITY): Payer: Medicare HMO

## 2021-12-25 ENCOUNTER — Encounter (HOSPITAL_COMMUNITY)
Admission: EM | Disposition: A | Discharge status: Rehabilitation Facility | Payer: Self-pay | Source: Home / Self Care | Attending: Neurosurgery

## 2021-12-25 DIAGNOSIS — I10 Essential (primary) hypertension: Secondary | ICD-10-CM

## 2021-12-25 DIAGNOSIS — Z87891 Personal history of nicotine dependence: Secondary | ICD-10-CM

## 2021-12-25 DIAGNOSIS — S065XAA Traumatic subdural hemorrhage with loss of consciousness status unknown, initial encounter: Secondary | ICD-10-CM | POA: Diagnosis not present

## 2021-12-25 HISTORY — PX: CRANIOTOMY: SHX93

## 2021-12-25 LAB — TYPE AND SCREEN
ABO/RH(D): A POS
Antibody Screen: NEGATIVE

## 2021-12-25 LAB — ABO/RH: ABO/RH(D): A POS

## 2021-12-25 LAB — SURGICAL PCR SCREEN
MRSA, PCR: NEGATIVE
Staphylococcus aureus: NEGATIVE

## 2021-12-25 SURGERY — CRANIOTOMY HEMATOMA EVACUATION SUBDURAL
Anesthesia: General | Site: Head | Laterality: Bilateral

## 2021-12-25 MED ORDER — HEMOSTATIC AGENTS (NO CHARGE) OPTIME
TOPICAL | Status: DC | PRN
  Administered 2021-12-25: 1 via TOPICAL

## 2021-12-25 MED ORDER — SODIUM CHLORIDE 0.9 % IV SOLN: INTRAVENOUS | Status: DC | PRN

## 2021-12-25 MED ORDER — LIDOCAINE-EPINEPHRINE 1 %-1:100000 IJ SOLN
INTRAMUSCULAR | Status: DC | PRN
  Administered 2021-12-25: 20 mL

## 2021-12-25 MED ORDER — ROCURONIUM BROMIDE 10 MG/ML (PF) SYRINGE
PREFILLED_SYRINGE | INTRAVENOUS | Status: DC | PRN
  Administered 2021-12-25: 70 mg via INTRAVENOUS
  Administered 2021-12-25: 30 mg via INTRAVENOUS

## 2021-12-25 MED ORDER — LABETALOL HCL 5 MG/ML IV SOLN
INTRAVENOUS | Status: AC
Start: 2021-12-25 — End: ?
  Filled 2021-12-25: qty 4

## 2021-12-25 MED ORDER — ONDANSETRON HCL 4 MG/2ML IJ SOLN
INTRAMUSCULAR | Status: DC | PRN
  Administered 2021-12-25: 4 mg via INTRAVENOUS

## 2021-12-25 MED ORDER — LABETALOL HCL 5 MG/ML IV SOLN: 10.0000 mg | INTRAVENOUS | Status: DC | PRN

## 2021-12-25 MED ORDER — B COMPLEX-C PO TABS
1.0000 | ORAL_TABLET | Freq: Every day | ORAL | Status: DC
  Administered 2021-12-26 – 2021-12-30 (×5): 1 via ORAL
  Filled 2021-12-25 (×6): qty 1

## 2021-12-25 MED ORDER — ATORVASTATIN CALCIUM 10 MG PO TABS
20.0000 mg | ORAL_TABLET | Freq: Every day | ORAL | Status: DC
  Administered 2021-12-26 – 2021-12-30 (×5): 20 mg via ORAL
  Filled 2021-12-25 (×5): qty 2

## 2021-12-25 MED ORDER — SODIUM CHLORIDE 0.9 % IV SOLN
INTRAVENOUS | Status: DC | PRN
  Administered 2021-12-25: 1000 mg via INTRAVENOUS

## 2021-12-25 MED ORDER — PROMETHAZINE HCL 25 MG PO TABS: 12.5000 mg | ORAL_TABLET | ORAL | Status: DC | PRN

## 2021-12-25 MED ORDER — BACITRACIN ZINC 500 UNIT/GM EX OINT
TOPICAL_OINTMENT | CUTANEOUS | Status: AC
  Filled 2021-12-25: qty 28.35

## 2021-12-25 MED ORDER — ACETAMINOPHEN 325 MG PO TABS
650.0000 mg | ORAL_TABLET | ORAL | Status: DC | PRN
  Administered 2021-12-28 – 2021-12-30 (×6): 650 mg via ORAL
  Filled 2021-12-25 (×6): qty 2

## 2021-12-25 MED ORDER — ONDANSETRON HCL 4 MG PO TABS
4.0000 mg | ORAL_TABLET | Freq: Four times a day (QID) | ORAL | Status: DC | PRN
  Filled 2021-12-25: qty 1

## 2021-12-25 MED ORDER — CEFAZOLIN SODIUM-DEXTROSE 2-4 GM/100ML-% IV SOLN
2.0000 g | Freq: Three times a day (TID) | INTRAVENOUS | Status: AC
  Administered 2021-12-25 – 2021-12-26 (×2): 2 g via INTRAVENOUS
  Filled 2021-12-25 (×2): qty 100

## 2021-12-25 MED ORDER — PROPOFOL 10 MG/ML IV BOLUS
INTRAVENOUS | Status: DC | PRN
  Administered 2021-12-25: 80 mg via INTRAVENOUS

## 2021-12-25 MED ORDER — LEVETIRACETAM IN NACL 500 MG/100ML IV SOLN
500.0000 mg | Freq: Two times a day (BID) | INTRAVENOUS | Status: DC
  Administered 2021-12-26 – 2021-12-28 (×5): 500 mg via INTRAVENOUS
  Filled 2021-12-25 (×5): qty 100

## 2021-12-25 MED ORDER — ORAL CARE MOUTH RINSE
15.0000 mL | OROMUCOSAL | Status: DC
  Administered 2021-12-25 – 2021-12-26 (×5): 15 mL via OROMUCOSAL

## 2021-12-25 MED ORDER — LIDOCAINE 2% (20 MG/ML) 5 ML SYRINGE
INTRAMUSCULAR | Status: DC | PRN
  Administered 2021-12-25: 100 mg via INTRAVENOUS

## 2021-12-25 MED ORDER — LISINOPRIL-HYDROCHLOROTHIAZIDE 20-25 MG PO TABS
1.0000 | ORAL_TABLET | Freq: Every day | ORAL | Status: DC
Start: 2021-12-25 — End: 2021-12-25

## 2021-12-25 MED ORDER — LABETALOL HCL 5 MG/ML IV SOLN
INTRAVENOUS | Status: DC | PRN
  Administered 2021-12-25 (×2): 2.5 mg via INTRAVENOUS

## 2021-12-25 MED ORDER — PANTOPRAZOLE SODIUM 40 MG IV SOLR
40.0000 mg | Freq: Every day | INTRAVENOUS | Status: DC
  Administered 2021-12-25 – 2021-12-27 (×3): 40 mg via INTRAVENOUS
  Filled 2021-12-25 (×3): qty 10

## 2021-12-25 MED ORDER — THROMBIN 20000 UNITS EX SOLR
CUTANEOUS | Status: DC | PRN
  Administered 2021-12-25: 20 mL via TOPICAL

## 2021-12-25 MED ORDER — LIDOCAINE-EPINEPHRINE 1 %-1:100000 IJ SOLN
INTRAMUSCULAR | Status: AC
  Filled 2021-12-25: qty 1

## 2021-12-25 MED ORDER — THROMBIN 20000 UNITS EX SOLR
CUTANEOUS | Status: AC
  Filled 2021-12-25: qty 20000

## 2021-12-25 MED ORDER — CEFAZOLIN SODIUM-DEXTROSE 2-3 GM-%(50ML) IV SOLR
INTRAVENOUS | Status: DC | PRN
  Administered 2021-12-25: 2 g via INTRAVENOUS

## 2021-12-25 MED ORDER — FENTANYL CITRATE (PF) 250 MCG/5ML IJ SOLN
INTRAMUSCULAR | Status: AC
  Filled 2021-12-25: qty 5

## 2021-12-25 MED ORDER — DEXAMETHASONE SODIUM PHOSPHATE 10 MG/ML IJ SOLN
INTRAMUSCULAR | Status: DC | PRN
  Administered 2021-12-25: 10 mg via INTRAVENOUS

## 2021-12-25 MED ORDER — VITAMIN D 25 MCG (1000 UNIT) PO TABS
1000.0000 [IU] | ORAL_TABLET | Freq: Every day | ORAL | Status: DC
  Administered 2021-12-26 – 2021-12-30 (×5): 1000 [IU] via ORAL
  Filled 2021-12-25 (×5): qty 1

## 2021-12-25 MED ORDER — FINASTERIDE 5 MG PO TABS
5.0000 mg | ORAL_TABLET | Freq: Every day | ORAL | Status: DC
  Administered 2021-12-26 – 2021-12-30 (×5): 5 mg via ORAL
  Filled 2021-12-25 (×6): qty 1

## 2021-12-25 MED ORDER — PROPOFOL 10 MG/ML IV BOLUS
INTRAVENOUS | Status: AC
  Filled 2021-12-25: qty 20

## 2021-12-25 MED ORDER — HYDROCODONE-ACETAMINOPHEN 5-325 MG PO TABS
1.0000 | ORAL_TABLET | ORAL | Status: DC | PRN
  Administered 2021-12-28: 1 via ORAL
  Filled 2021-12-25: qty 1

## 2021-12-25 MED ORDER — SUGAMMADEX SODIUM 500 MG/5ML IV SOLN
INTRAVENOUS | Status: AC
  Filled 2021-12-25: qty 5

## 2021-12-25 MED ORDER — LISINOPRIL 20 MG PO TABS
20.0000 mg | ORAL_TABLET | Freq: Every day | ORAL | Status: DC
  Administered 2021-12-26 – 2021-12-30 (×5): 20 mg via ORAL
  Filled 2021-12-25 (×5): qty 1

## 2021-12-25 MED ORDER — 0.9 % SODIUM CHLORIDE (POUR BTL) OPTIME
TOPICAL | Status: DC | PRN
  Administered 2021-12-25: 3000 mL

## 2021-12-25 MED ORDER — ORAL CARE MOUTH RINSE
15.0000 mL | OROMUCOSAL | Status: DC | PRN
Start: 2021-12-25 — End: 2021-12-27

## 2021-12-25 MED ORDER — SUGAMMADEX SODIUM 200 MG/2ML IV SOLN
INTRAVENOUS | Status: DC | PRN
  Administered 2021-12-25: 440 mg via INTRAVENOUS

## 2021-12-25 MED ORDER — LATANOPROST 0.005 % OP SOLN
1.0000 [drp] | Freq: Every day | OPHTHALMIC | Status: DC
Start: 2021-12-25 — End: 2021-12-30
  Administered 2021-12-25 – 2021-12-29 (×6): 1 [drp] via OPHTHALMIC
  Filled 2021-12-25: qty 2.5

## 2021-12-25 MED ORDER — TAMSULOSIN HCL 0.4 MG PO CAPS
0.4000 mg | ORAL_CAPSULE | Freq: Two times a day (BID) | ORAL | Status: DC
  Administered 2021-12-26 – 2021-12-30 (×9): 0.4 mg via ORAL
  Filled 2021-12-25 (×10): qty 1

## 2021-12-25 MED ORDER — HYDROMORPHONE HCL 1 MG/ML IJ SOLN: 0.5000 mg | INTRAMUSCULAR | Status: DC | PRN

## 2021-12-25 MED ORDER — HYDROCHLOROTHIAZIDE 25 MG PO TABS
25.0000 mg | ORAL_TABLET | Freq: Every day | ORAL | Status: DC
  Administered 2021-12-26 – 2021-12-30 (×5): 25 mg via ORAL
  Filled 2021-12-25 (×5): qty 1

## 2021-12-25 MED ORDER — CHLORHEXIDINE GLUCONATE CLOTH 2 % EX PADS
6.0000 | MEDICATED_PAD | Freq: Every day | CUTANEOUS | Status: DC
  Administered 2021-12-25 – 2021-12-29 (×6): 6 via TOPICAL

## 2021-12-25 MED ORDER — ONDANSETRON HCL 4 MG/2ML IJ SOLN
4.0000 mg | Freq: Four times a day (QID) | INTRAMUSCULAR | Status: DC | PRN
  Administered 2021-12-26: 4 mg via INTRAVENOUS
  Filled 2021-12-25: qty 2

## 2021-12-25 MED ORDER — FENTANYL CITRATE (PF) 250 MCG/5ML IJ SOLN
INTRAMUSCULAR | Status: DC | PRN
  Administered 2021-12-25: 50 ug via INTRAVENOUS
  Administered 2021-12-25: 100 ug via INTRAVENOUS

## 2021-12-25 SURGICAL SUPPLY — 69 items
ADH SKN CLS APL DERMABOND .7 (GAUZE/BANDAGES/DRESSINGS) ×1
BAG COUNTER SPONGE SURGICOUNT (BAG) ×2 IMPLANT
BAG DECANTER FOR FLEXI CONT (MISCELLANEOUS) ×2 IMPLANT
BAG SPNG CNTER NS LX DISP (BAG) ×1
BIT DRILL WIRE PASS 1.3MM (BIT) ×1 IMPLANT
BLADE SURG 11 STRL SS (BLADE) IMPLANT
BNDG CMPR 75X41 PLY HI ABS (GAUZE/BANDAGES/DRESSINGS)
BNDG COHESIVE 4X5 TAN STRL (GAUZE/BANDAGES/DRESSINGS) IMPLANT
BNDG STRETCH 4X75 STRL LF (GAUZE/BANDAGES/DRESSINGS) IMPLANT
BUR ACORN 6.0 PRECISION (BURR) ×2 IMPLANT
BUR SPIRAL ROUTER 2.3 (BUR) ×1 IMPLANT
CANISTER SUCT 3000ML PPV (MISCELLANEOUS) ×2 IMPLANT
CARTRIDGE OIL MAESTRO DRILL (MISCELLANEOUS) ×1 IMPLANT
CLIP VESOCCLUDE MED 6/CT (CLIP) IMPLANT
COVER BACK TABLE 60X90IN (DRAPES) ×2 IMPLANT
DERMABOND ADVANCED (GAUZE/BANDAGES/DRESSINGS) ×2
DERMABOND ADVANCED .7 DNX12 (GAUZE/BANDAGES/DRESSINGS) IMPLANT
DIFFUSER DRILL AIR PNEUMATIC (MISCELLANEOUS) ×2 IMPLANT
DRAIN CHANNEL 10M FLAT 3/4 FLT (DRAIN) ×2 IMPLANT
DRAPE MICROSCOPE LEICA (MISCELLANEOUS) IMPLANT
DRAPE NEUROLOGICAL W/INCISE (DRAPES) ×2 IMPLANT
DRAPE SURG 17X23 STRL (DRAPES) IMPLANT
DRAPE WARM FLUID 44X44 (DRAPES) ×2 IMPLANT
DRILL WIRE PASS 1.3MM (BIT) ×2
ELECT REM PT RETURN 9FT ADLT (ELECTROSURGICAL) ×2
ELECTRODE REM PT RTRN 9FT ADLT (ELECTROSURGICAL) ×1 IMPLANT
EVACUATOR SILICONE 100CC (DRAIN) ×2 IMPLANT
GAUZE 4X4 16PLY ~~LOC~~+RFID DBL (SPONGE) IMPLANT
GAUZE SPONGE 4X4 12PLY STRL (GAUZE/BANDAGES/DRESSINGS) ×2 IMPLANT
GLOVE BIO SURGEON STRL SZ 6.5 (GLOVE) ×2 IMPLANT
GLOVE BIOGEL PI IND STRL 6.5 (GLOVE) ×1 IMPLANT
GLOVE BIOGEL PI IND STRL 7.0 (GLOVE) IMPLANT
GLOVE BIOGEL PI INDICATOR 6.5 (GLOVE) ×2
GLOVE BIOGEL PI INDICATOR 7.0 (GLOVE) ×4
GLOVE ECLIPSE 9.0 STRL (GLOVE) ×3 IMPLANT
GLOVE EXAM NITRILE XL STR (GLOVE) IMPLANT
GOWN STRL REUS W/ TWL LRG LVL3 (GOWN DISPOSABLE) IMPLANT
GOWN STRL REUS W/ TWL XL LVL3 (GOWN DISPOSABLE) IMPLANT
GOWN STRL REUS W/TWL 2XL LVL3 (GOWN DISPOSABLE) IMPLANT
GOWN STRL REUS W/TWL LRG LVL3 (GOWN DISPOSABLE) ×4
GOWN STRL REUS W/TWL XL LVL3 (GOWN DISPOSABLE) ×4
HEMOSTAT SURGICEL 2X14 (HEMOSTASIS) ×1 IMPLANT
KIT BASIN OR (CUSTOM PROCEDURE TRAY) ×2 IMPLANT
KIT TURNOVER KIT B (KITS) ×2 IMPLANT
NDL HYPO 25X1 1.5 SAFETY (NEEDLE) ×1 IMPLANT
NEEDLE HYPO 25X1 1.5 SAFETY (NEEDLE) ×2 IMPLANT
NS IRRIG 1000ML POUR BTL (IV SOLUTION) ×4 IMPLANT
OIL CARTRIDGE MAESTRO DRILL (MISCELLANEOUS) ×2
PACK CRANIOTOMY CUSTOM (CUSTOM PROCEDURE TRAY) ×2 IMPLANT
PAD ARMBOARD 7.5X6 YLW CONV (MISCELLANEOUS) ×2 IMPLANT
PATTIES SURGICAL .25X.25 (GAUZE/BANDAGES/DRESSINGS) IMPLANT
PATTIES SURGICAL .5 X.5 (GAUZE/BANDAGES/DRESSINGS) IMPLANT
PATTIES SURGICAL .5 X3 (DISPOSABLE) IMPLANT
PATTIES SURGICAL 1X1 (DISPOSABLE) IMPLANT
PIN MAYFIELD SKULL DISP (PIN) IMPLANT
SPONGE NEURO XRAY DETECT 1X3 (DISPOSABLE) IMPLANT
SPONGE SURGIFOAM ABS GEL 100 (HEMOSTASIS) ×2 IMPLANT
SPONGE T-LAP 18X18 ~~LOC~~+RFID (SPONGE) IMPLANT
STAPLER VISISTAT 35W (STAPLE) ×2 IMPLANT
STOCKINETTE 6  STRL (DRAPES) ×2
STOCKINETTE 6 STRL (DRAPES) IMPLANT
SUT NURALON 4 0 TR CR/8 (SUTURE) ×4 IMPLANT
SUT VIC AB 2-0 CT2 18 VCP726D (SUTURE) ×4 IMPLANT
TAPE CLOTH SURG 4X10 WHT LF (GAUZE/BANDAGES/DRESSINGS) ×1 IMPLANT
TOWEL GREEN STERILE (TOWEL DISPOSABLE) ×2 IMPLANT
TOWEL GREEN STERILE FF (TOWEL DISPOSABLE) ×2 IMPLANT
TRAY FOLEY MTR SLVR 16FR STAT (SET/KITS/TRAYS/PACK) ×1 IMPLANT
UNDERPAD 30X36 HEAVY ABSORB (UNDERPADS AND DIAPERS) IMPLANT
WATER STERILE IRR 1000ML POUR (IV SOLUTION) ×2 IMPLANT

## 2021-12-25 NOTE — Anesthesia Postprocedure Evaluation (Signed)
Anesthesia Post Note  Patient: Brandon [name]  Procedure(s) Performed: CRANIOTOMY HEMATOMA EVACUATION SUBDURAL (Bilateral: Head)     Patient location during evaluation: PACU Anesthesia Type: General Level of consciousness: responds to stimulation (drowsy but following commands) Pain management: pain level controlled Vital Signs Assessment: post-procedure vital signs reviewed and stable Respiratory status: spontaneous breathing, nonlabored ventilation, respiratory function stable and patient connected to nasal cannula oxygen Cardiovascular status: blood pressure returned to baseline and stable Postop Assessment: no apparent nausea or vomiting Anesthetic complications: no   No notable events documented.  Last Vitals:  Vitals:   12/25/21 1455 12/25/21 1500  BP: (!) 148/87 (!) 149/79  Pulse: 71 68  Resp: 12 14  Temp: (!) 36.1 C   SpO2: 95% 99%    Last Pain:  Vitals:   12/25/21 1455  TempSrc:   PainSc: Asleep                 Lidia Collum

## 2021-12-25 NOTE — Anesthesia Procedure Notes (Signed)
Procedure Name: Intubation Date/Time: 12/25/2021 1:27 PM  Performed by: Griffin Dakin, CRNAPre-anesthesia Checklist: Patient identified, Emergency Drugs available, Suction available and Patient being monitored Patient Re-evaluated:Patient Re-evaluated prior to induction Oxygen Delivery Method: Circle system utilized Preoxygenation: Pre-oxygenation with 100% oxygen Induction Type: IV induction Ventilation: Mask ventilation without difficulty Laryngoscope Size: Mac and 4 Grade View: Grade II Tube type: Oral Number of attempts: 1 Airway Equipment and Method: Stylet and Oral airway Placement Confirmation: ETT inserted through vocal cords under direct vision, positive ETCO2 and breath sounds checked- equal and bilateral Secured at: 25 cm Tube secured with: Tape Dental Injury: Teeth and Oropharynx as per pre-operative assessment

## 2021-12-25 NOTE — Op Note (Signed)
Date of procedure: 12/25/2021  Date of dictation: Same  Service: Neurosurgery  Preoperative diagnosis: Bilateral subacute subdural hematoma, posttraumatic  Postoperative diagnosis: Same  Procedure Name: Bilateral frontal and parietal bur holes for evacuation of subdural hematoma.  Placement bilateral subdural drains  Surgeon:Feige Lowdermilk A.Gail Vendetti, M.D.  Asst. Surgeon: None  Anesthesia: General  Indication: 76 year old male status post frequent falls with recently discovered large bilateral subacute subdural hematomas with marked mass effect.  Patient acutely declined today and I been asked to perform emergent surgery by his treating physician Dr. Saintclair Halsted.  I discussed situation with patient's wife.  She wishes for me to proceed.  Operative note: After induction of anesthesia, patient positioned supine.  Patient's scalp was prepped and draped sterilely.  Starting first on the right side a frontal and parietal incision was made.  Self-retaining tractors were placed.  Bur holes were made using high-speed drill.  Dura was coagulated and then incised.  Subdural hemorrhage under pressure was allowed to drain.  The dural edges were burned back to the edges of the bur holes themselves.  There is no evidence of any active hemorrhage ongoing.  A 10 mm flat Blake drain was then passed under direct visualization from the parietal hole past the frontal hole.  This was exited through a separate incision and then connected to a suction system.  Wound is then closed with 2-0 Vicryl suture the galea and staples at the surface.  The procedure was repeated on the left side again without complication and again with similar findings.  Wounds were then sterilely dressed.  The patient tolerated the procedure well and he returned to the recovery room postop.

## 2021-12-25 NOTE — Brief Op Note (Signed)
12/25/2021  2:42 PM  PATIENT:  Brandon [name]  76 y.o. male  PRE-OPERATIVE DIAGNOSIS:  SUBDURAL HEMATOMA, BILATERAL  POST-OPERATIVE DIAGNOSIS:  SUBDURAL HEMATOMA, BILATERAL  PROCEDURE:  Procedure(s): CRANIOTOMY HEMATOMA EVACUATION SUBDURAL (Bilateral)  SURGEON:  Surgeon(s) and Role:    * Earnie Larsson, MD - Primary  PHYSICIAN ASSISTANT:   ASSISTANTS:    ANESTHESIA:   general  EBL:  per anes   BLOOD ADMINISTERED:none  DRAINS: (10 mm) Blake drain(s) in the subdural space bilateral    LOCAL MEDICATIONS USED:  LIDOCAINE   SPECIMEN:  No Specimen  DISPOSITION OF SPECIMEN:  N/A  COUNTS:  YES  TOURNIQUET:  * No tourniquets in log *  DICTATION: .Dragon Dictation  PLAN OF CARE: Admit to inpatient   PATIENT DISPOSITION:  PACU - hemodynamically stable.   Delay start of Pharmacological VTE agent (>24hrs) due to surgical blood loss or risk of bleeding: yes

## 2021-12-25 NOTE — Anesthesia Preprocedure Evaluation (Signed)
Anesthesia Evaluation  Patient identified by MRN, date of birth, ID band Patient awake    Reviewed: Allergy & Precautions, NPO status , Patient's Chart, lab work & pertinent test results  History of Anesthesia Complications Negative for: history of anesthetic complications  Airway Mallampati: II  TM Distance: >3 FB Neck ROM: Full    Dental  (+) Teeth Intact, Dental Advisory Given   Pulmonary neg pulmonary ROS, former smoker,    Pulmonary exam normal        Cardiovascular hypertension, Normal cardiovascular exam+ Valvular Problems/Murmurs MR      Neuro/Psych Acute subdural hematoma with progressive neurologic decline    GI/Hepatic Neg liver ROS, GERD  ,  Endo/Other  negative endocrine ROS  Renal/GU negative Renal ROS  negative genitourinary   Musculoskeletal negative musculoskeletal ROS (+)   Abdominal   Peds  Hematology negative hematology ROS (+)   Anesthesia Other Findings   Reproductive/Obstetrics                            Anesthesia Physical Anesthesia Plan  ASA: 3 and emergent  Anesthesia Plan: General   Post-op Pain Management: Ofirmev IV (intra-op)*   Induction: Intravenous  PONV Risk Score and Plan: 2 and Ondansetron, Dexamethasone, Treatment may vary due to age or medical condition and Midazolam  Airway Management Planned: Oral ETT  Additional Equipment: None  Intra-op Plan:   Post-operative Plan: Extubation in OR and Possible Post-op intubation/ventilation  Informed Consent: I have reviewed the patients History and Physical, chart, labs and discussed the procedure including the risks, benefits and alternatives for the proposed anesthesia with the patient or authorized representative who has indicated his/her understanding and acceptance.     Dental advisory given and Consent reviewed with POA  Plan Discussed with:   Anesthesia Plan Comments: (Spoke with wife  by phone for consent. Discussed possibility of postop intubation if necessary. )        Anesthesia Quick Evaluation

## 2021-12-25 NOTE — Progress Notes (Signed)
Patient admitted last night with bilateral symptomatic chronic subdural hematoma with some acute blood posteriorly on the right.  He has worsened through the course of the day and now has a dense left-sided hemiparesis.  Follow-up head CT scan demonstrates enlargement of his left chronic subdural fluid collection with minimal change in his acute hemorrhage.  Dr. Saintclair Halsted has asked me to move forward with emergent intervention as he is detained performing other surgery at present.  I have reviewed the patient's exam and CT scan findings.  We will move forward emergently with bilateral bur hole evacuation of subdural hematomas.  I discussed the risks and benefits with the patient's wife.  She agrees to proceed.

## 2021-12-25 NOTE — Progress Notes (Signed)
Subjective: Patient no having left sided weakness that is worse than last night.   Objective: Vital signs in last 24 hours: Temp:  [97.4 F (36.3 C)-97.8 F (36.6 C)] 97.8 F (36.6 C) (08/14 0744) Pulse Rate:  [63-87] 81 (08/14 1000) Resp:  [11-18] 18 (08/14 1000) BP: (105-164)/(57-102) 162/101 (08/14 1000) SpO2:  [88 %-99 %] 99 % (08/14 1000) Weight:  [107.8 kg] 107.8 kg (08/14 0022)  Intake/Output from previous day: 08/13 0701 - 08/14 0700 In: -  Out: 200 [Urine:200] Intake/Output this shift: No intake/output data recorded.  Neuro: left hemiparesis which is new from yesterday, a little more difficult to converse with today. Unable to lift left leg off of the bed today   Lab Results: Lab Results  Component Value Date   WBC 6.5 12/24/2021   HGB 13.8 12/24/2021   HCT 39.6 12/24/2021   MCV 87.8 12/24/2021   PLT 128 (L) 12/24/2021   No results found for: "INR", "PROTIME" BMET Lab Results  Component Value Date   NA 138 12/24/2021   K 3.7 12/24/2021   CL 106 12/24/2021   CO2 23 12/24/2021   GLUCOSE 166 (H) 12/24/2021   BUN 19 12/24/2021   CREATININE 1.03 12/24/2021   CALCIUM 9.0 12/24/2021    Studies/Results: CT Head Wo Contrast  Result Date: 12/24/2021 CLINICAL DATA:  Initial evaluation for acute trauma. EXAM: CT HEAD WITHOUT CONTRAST CT CERVICAL SPINE WITHOUT CONTRAST TECHNIQUE: Multidetector CT imaging of the head and cervical spine was performed following the standard protocol without intravenous contrast. Multiplanar CT image reconstructions of the cervical spine were also generated. RADIATION DOSE REDUCTION: This exam was performed according to the departmental dose-optimization program which includes automated exposure control, adjustment of the mA and/or kV according to patient size and/or use of iterative reconstruction technique. COMPARISON:  None Available. FINDINGS: CT HEAD FINDINGS Brain: Large acute on subacute to chronic bilateral subdural hematomas are  seen. Collection on the right measures up to 2.5 cm in diameter, with the collection on the left measuring up to 1.5 cm in diameter. Associated mass effect with 5 mm of right-to-left shift. Acute subdural blood seen extending along the falx and tentorium. Probable small associated subarachnoid component at the mid left frontal region (series 3, image 27). No other parenchymal or intra ventricular hemorrhage. No hydrocephalus or trapping. Basilar cisterns remain patent. No visible acute large vessel territory infarct or mass. Underlying mild chronic microvascular ischemic disease. Vascular: No hyperdense vessel. Calcified atherosclerosis present at the skull base. Skull: Soft tissue contusion with laceration at the left occipital scalp. No calvarial fracture. Sinuses/Orbits: Globes orbital soft tissues within normal limits. Paranasal sinuses and mastoid air cells are clear. Other: None. CT CERVICAL SPINE FINDINGS Alignment: Straightening of the normal cervical lordosis. No listhesis or malalignment. Skull base and vertebrae: Skull base intact. Normal C1-2 articulations are preserved in the dens is intact. Vertebral body heights maintained. No acute fracture. Soft tissues and spinal canal: Soft tissues of the neck demonstrate no acute finding. No abnormal prevertebral edema. Moderate atheromatous change about the carotid bifurcations. Spinal canal demonstrates no visible acute finding. Disc levels: Moderate degenerative spondylosis seen C3-4 through C7-T1. Upper chest: Small focus of ground-glass opacity at the mid right lung apex, nonspecific, but possibly a small focus of mild pneumonitis (series 4, image 105). Underlying mild emphysema. Visualized upper chest demonstrates no other acute finding. Other: None. IMPRESSION: CT BRAIN: 1. Large acute on subacute to chronic bilateral subdural hematomas, measuring up to 2.5 cm on the right  and 1.5 cm on the left. Extension along the falx and tentorium. Associated mass  effect with up to 5 mm of right-to-left shift. 2. Soft tissue contusion with laceration at the left occipital scalp. No calvarial fracture. 3. Underlying mild chronic microvascular ischemic disease. CT CERVICAL SPINE: 1. No acute traumatic injury within the cervical spine. 2. Moderate degenerative spondylosis C3-4 through C7-T1. 3. Small focus of ground-glass opacity at the mid right lung apex, nonspecific, but possibly mild pneumonitis. Critical Value/emergent results were called by telephone at the time of interpretation on 12/24/2021 at 10:00 pm to provider [facility] , who verbally acknowledged these results. Electronically Signed   By: Jeannine Boga M.D.   On: 12/24/2021 22:05   CT Cervical Spine Wo Contrast  Result Date: 12/24/2021 CLINICAL DATA:  Initial evaluation for acute trauma. EXAM: CT HEAD WITHOUT CONTRAST CT CERVICAL SPINE WITHOUT CONTRAST TECHNIQUE: Multidetector CT imaging of the head and cervical spine was performed following the standard protocol without intravenous contrast. Multiplanar CT image reconstructions of the cervical spine were also generated. RADIATION DOSE REDUCTION: This exam was performed according to the departmental dose-optimization program which includes automated exposure control, adjustment of the mA and/or kV according to patient size and/or use of iterative reconstruction technique. COMPARISON:  None Available. FINDINGS: CT HEAD FINDINGS Brain: Large acute on subacute to chronic bilateral subdural hematomas are seen. Collection on the right measures up to 2.5 cm in diameter, with the collection on the left measuring up to 1.5 cm in diameter. Associated mass effect with 5 mm of right-to-left shift. Acute subdural blood seen extending along the falx and tentorium. Probable small associated subarachnoid component at the mid left frontal region (series 3, image 27). No other parenchymal or intra ventricular hemorrhage. No hydrocephalus or trapping. Basilar cisterns  remain patent. No visible acute large vessel territory infarct or mass. Underlying mild chronic microvascular ischemic disease. Vascular: No hyperdense vessel. Calcified atherosclerosis present at the skull base. Skull: Soft tissue contusion with laceration at the left occipital scalp. No calvarial fracture. Sinuses/Orbits: Globes orbital soft tissues within normal limits. Paranasal sinuses and mastoid air cells are clear. Other: None. CT CERVICAL SPINE FINDINGS Alignment: Straightening of the normal cervical lordosis. No listhesis or malalignment. Skull base and vertebrae: Skull base intact. Normal C1-2 articulations are preserved in the dens is intact. Vertebral body heights maintained. No acute fracture. Soft tissues and spinal canal: Soft tissues of the neck demonstrate no acute finding. No abnormal prevertebral edema. Moderate atheromatous change about the carotid bifurcations. Spinal canal demonstrates no visible acute finding. Disc levels: Moderate degenerative spondylosis seen C3-4 through C7-T1. Upper chest: Small focus of ground-glass opacity at the mid right lung apex, nonspecific, but possibly a small focus of mild pneumonitis (series 4, image 105). Underlying mild emphysema. Visualized upper chest demonstrates no other acute finding. Other: None. IMPRESSION: CT BRAIN: 1. Large acute on subacute to chronic bilateral subdural hematomas, measuring up to 2.5 cm on the right and 1.5 cm on the left. Extension along the falx and tentorium. Associated mass effect with up to 5 mm of right-to-left shift. 2. Soft tissue contusion with laceration at the left occipital scalp. No calvarial fracture. 3. Underlying mild chronic microvascular ischemic disease. CT CERVICAL SPINE: 1. No acute traumatic injury within the cervical spine. 2. Moderate degenerative spondylosis C3-4 through C7-T1. 3. Small focus of ground-glass opacity at the mid right lung apex, nonspecific, but possibly mild pneumonitis. Critical  Value/emergent results were called by telephone at the time of interpretation  on 12/24/2021 at 10:00 pm to provider [facility] , who verbally acknowledged these results. Electronically Signed   By: Jeannine Boga M.D.   On: 12/24/2021 22:05    Assessment/Plan: Bilateral SDH hematoma with worsening of his neurologic exam this morning upon my assessment. Yesterday he only had a left pronator drift but today he has left hemiparesis and oriented x2. We will get a stat head CT to look at the sdh. Planning for OR today, will try to arrange sooner than this evening.    LOS: 1 day    Brandon [name] Brandon [name] 12/25/2021, 11:07 AM

## 2021-12-25 NOTE — Progress Notes (Signed)
RN was given patient's wedding ring in short stay.  Was placed in denture case with patient label on keyboard in 4N21.

## 2021-12-25 NOTE — Transfer of Care (Signed)
Immediate Anesthesia Transfer of Care Note  Patient: Brandon [name]  Procedure(s) Performed: CRANIOTOMY HEMATOMA EVACUATION SUBDURAL (Bilateral: Head)  Patient Location: PACU  Anesthesia Type:General  Level of Consciousness: drowsy and responds to stimulation  Airway & Oxygen Therapy: Patient Spontanous Breathing and Patient connected to face mask oxygen  Post-op Assessment: Report given to RN and Post -op Vital signs reviewed and stable; moves right side  Post vital signs: Reviewed and stable  Last Vitals:  Vitals Value Taken Time  BP 148/87 12/25/21 1453  Temp    Pulse 70 12/25/21 1456  Resp 11 12/25/21 1456  SpO2 95 % 12/25/21 1456  Vitals shown include unvalidated device data.  Last Pain:  Vitals:   12/25/21 1200  TempSrc:   PainSc: Asleep         Complications: No notable events documented.

## 2021-12-26 ENCOUNTER — Inpatient Hospital Stay (HOSPITAL_COMMUNITY): Payer: Medicare HMO

## 2021-12-26 ENCOUNTER — Encounter (HOSPITAL_COMMUNITY): Payer: Self-pay | Admitting: Neurosurgery

## 2021-12-26 NOTE — Evaluation (Signed)
Physical Therapy Evaluation Patient Details Name: Brandon [name] MRN: [id number] DOB: 1945/11/26 Today's Date: 12/26/2021  History of Present Illness  Patient is a 76 y/o male who presents on 8/14 with a fall out of bed. Head CT- bilateral SDH with midline shift. s/p craniectomy for evacuation of bilateral subdural hematomas 12/25/21. PMH includes HTN.  Clinical Impression  Patient presents with left sided weakness, impaired balance, nausea, headache and impaired mobility s/p above. Pt lives at home with his wife and reports using cane for ambulation PRN PTA. Does endorse hx of falls the last 4 weeks but prior to this, pt completely independent, driving and performing IADLS. Very HOH so needs repetition to follow commands. Today, pt requires Min A for bed mobility, Min A of 2 for standing and transfers. Able to take a few steps to get to chair with assist for RW management/proximity and LLE advancement. Unfamiliar with using RW. + nausea with movement impacting further mobility. Pt with supportive wife. Would benefit from AIR to maximize independence and mobility prior to return home. Will follow acutely.       Recommendations for follow up therapy are one component of a multi-disciplinary discharge planning process, led by the attending physician.  Recommendations may be updated based on patient status, additional functional criteria and insurance authorization.  Follow Up Recommendations Acute inpatient rehab (3hours/day)      Assistance Recommended at Discharge Frequent or constant Supervision/Assistance  Patient can return home with the following  Two people to help with walking and/or transfers;A lot of help with bathing/dressing/bathroom;Assistance with cooking/housework;Assist for transportation;Help with stairs or ramp for entrance    Equipment Recommendations Other (comment) (defer to AIR)  Recommendations for Other Services  Rehab consult    Functional Status Assessment Patient has  had a recent decline in their functional status and demonstrates the ability to make significant improvements in function in a reasonable and predictable amount of time.     Precautions / Restrictions Precautions Precautions: Fall;Other (comment) Precaution Comments: 2 JP drains in head Restrictions Weight Bearing Restrictions: No      Mobility  Bed Mobility Overal bed mobility: Needs Assistance Bed Mobility: Supine to Sit     Supine to sit: Min assist, HOB elevated     General bed mobility comments: Assist with trunk to get to EOB, increased time and cues.    Transfers Overall transfer level: Needs assistance Equipment used: Rolling walker (2 wheels) Transfers: Sit to/from Stand, Bed to chair/wheelchair/BSC Sit to Stand: Min assist, From elevated surface   Step pivot transfers: Min assist, +2 safety/equipment, +2 physical assistance       General transfer comment: Min A to power to standing with cues for hand placement, pt pulling up on RW. Able to take a few steps to get to chair with assist for RW management, weight shifting for LLE advancement and balance, leaning posteriorly when backing up to chair.    Ambulation/Gait Ambulation/Gait assistance: Min assist, +2 physical assistance Gait Distance (Feet): 4 Feet Assistive device: Rolling walker (2 wheels)   Gait velocity: decreased     General Gait Details: Able to take a few steps fowards with assist for RW management/proximity with difficulty advancing LLE. Posterior bias when backing up needing assist. + nausea  Stairs            Wheelchair Mobility    Modified Rankin (Stroke Patients Only) Modified Rankin (Stroke Patients Only) Pre-Morbid Rankin Score: Slight disability Modified Rankin: Moderately severe disability     Balance  Overall balance assessment: Needs assistance, History of Falls Sitting-balance support: Feet supported, No upper extremity supported Sitting balance-Leahy Scale:  Fair Sitting balance - Comments: Assist needed to donn socks sitting EOB   Standing balance support: During functional activity, Reliant on assistive device for balance Standing balance-Leahy Scale: Poor Standing balance comment: Requires UE support in standing and external support for dynamic tasks/walking                             Pertinent Vitals/Pain Pain Assessment Pain Assessment: Faces Faces Pain Scale: Hurts little more Pain Location: head with movement Pain Descriptors / Indicators: Grimacing, Sore, Discomfort, Headache Pain Intervention(s): Monitored during session, Repositioned    Home Living Family/patient expects to be discharged to:: Private residence Living Arrangements: Spouse/significant other Available Help at Discharge: Family;Available 24 hours/day Type of Home: House Home Access: Stairs to enter Entrance Stairs-Rails: None Entrance Stairs-Number of Steps: 1 +1   Home Layout: One level Home Equipment: Cane - single point;Shower seat      Prior Function Prior Level of Function : Independent/Modified Independent             Mobility Comments: recently using canes. Started falling in last month ADLs Comments: Performs ADLs and simple IADLs.     Hand Dominance   Dominant Hand: Right    Extremity/Trunk Assessment   Upper Extremity Assessment Upper Extremity Assessment: Defer to OT evaluation    Lower Extremity Assessment Lower Extremity Assessment: LLE deficits/detail LLE Deficits / Details: Grossly ~3/5 hip flexion, 4/5 knee extension LLE Sensation: WNL    Cervical / Trunk Assessment Cervical / Trunk Assessment: Normal  Communication   Communication: HOH  Cognition Arousal/Alertness: Awake/alert Behavior During Therapy: WFL for tasks assessed/performed Overall Cognitive Status: Difficult to assess                                 General Comments: Pt very HOH so needs repetition at times. Laughs appropriately,  follows commands well.        General Comments General comments (skin integrity, edema, etc.): Wife present during session.    Exercises     Assessment/Plan    PT Assessment Patient needs continued PT services  PT Problem List Decreased strength;Decreased mobility;Decreased safety awareness;Decreased balance;Decreased knowledge of use of DME;Decreased activity tolerance;Decreased skin integrity       PT Treatment Interventions Therapeutic activities;Gait training;Therapeutic exercise;Patient/family education;Balance training;Functional mobility training;Stair training;Neuromuscular re-education;DME instruction;Cognitive remediation    PT Goals (Current goals can be found in the Care Plan section)  Acute Rehab PT Goals Patient Stated Goal: to get better and go home PT Goal Formulation: With patient/family Time For Goal Achievement: 01/09/22 Potential to Achieve Goals: Good    Frequency Min 4X/week     Co-evaluation PT/OT/SLP Co-Evaluation/Treatment: Yes Reason for Co-Treatment: Necessary to address cognition/behavior during functional activity;To address functional/ADL transfers;For patient/therapist safety PT goals addressed during session: Mobility/safety with mobility;Balance;Strengthening/ROM;Proper use of DME         AM-PAC PT "6 Clicks" Mobility  Outcome Measure Help needed turning from your back to your side while in a flat bed without using bedrails?: A Little Help needed moving from lying on your back to sitting on the side of a flat bed without using bedrails?: A Little Help needed moving to and from a bed to a chair (including a wheelchair)?: A Little Help needed standing up from a chair  using your arms (e.g., wheelchair or bedside chair)?: A Lot Help needed to walk in hospital room?: Total Help needed climbing 3-5 steps with a railing? : Total 6 Click Score: 13    End of Session Equipment Utilized During Treatment: Gait belt Activity Tolerance: Other  (comment) (nausea) Patient left: in chair;with call bell/phone within reach;with chair alarm set;with nursing/sitter in room;with family/visitor present Nurse Communication: Mobility status;Other (comment) (nausea meds) PT Visit Diagnosis: Pain;Hemiplegia and hemiparesis;History of falling (Z91.81);Unsteadiness on feet (R26.81);Difficulty in walking, not elsewhere classified (R26.2) Hemiplegia - Right/Left: Left Hemiplegia - dominant/non-dominant: Non-dominant Hemiplegia - caused by: Unspecified Pain - part of body:  (head)    Time: 4356-8616 PT Time Calculation (min) (ACUTE ONLY): 30 min   Charges:   PT Evaluation $PT Eval Moderate Complexity: 1 Mod          Marisa Severin, PT, DPT Acute Rehabilitation Services Secure chat preferred Office [place] 12/26/2021, 3:47 PM

## 2021-12-26 NOTE — Progress Notes (Signed)
Looks much better this morning.  Awake and alert.  Speech fluent.  Follows commands readily.  Minimal pronator drift on the left.  He is afebrile.  Vital signs are stable.  Drain output is moderate.  Wounds are clean and dry.  Overall progressing well following bilateral bur hole evacuation of subdural hematoma.  Continue drains for now.  Plan follow-up head CT scan Thursday morning with likely removal of drains at that time.  Patient may be mobilized.

## 2021-12-26 NOTE — TOC CAGE-AID Note (Signed)
Transition of Care [facility]) - CAGE-AID Screening   Patient Details  Name: Brandon [name] MRN: [id number] Date of Birth: 05-Feb-1946  Transition of Care [facility]) CM/SW Contact:    Coralee Pesa, [place] Phone Number: 12/26/2021, 3:18 PM   Clinical Narrative: CSW met with pt at bedside to complete CAGE- AID assessment. CSW was unable to wake pt up to participate.   CAGE-AID Screening: Substance Abuse Screening unable to be completed due to: : Patient unable to participate

## 2021-12-26 NOTE — Evaluation (Signed)
Occupational Therapy Evaluation Patient Details Name: Brandon [name] MRN: [id number] DOB: 11-22-1945 Today's Date: 12/26/2021   History of Present Illness Patient is a 76 y/o male who presents on 8/14 with a fall out of bed. Head CT- bilateral SDH with midline shift. s/p craniectomy for evacuation of bilateral subdural hematomas 12/25/21. PMH includes HTN.   Clinical Impression   PTA, pt was living with his wife and was independent and using a cane for mobility. Pt currently requiring Min A for UB ADLs, Max A for LB ADLs, and Min A +2 for short distance mobility to recliner with RW. Pt presenting with L sided weakness, poor balance, and fatigue. Despite fatigue, HA, and nausea, pt very motivated to participate in therapy. Pt would benefit from further acute OT to facilitate safe dc. Recommend dc to AIR for further OT to optimize safety, independence with ADLs, and return to PLOF.      Recommendations for follow up therapy are one component of a multi-disciplinary discharge planning process, led by the attending physician.  Recommendations may be updated based on patient status, additional functional criteria and insurance authorization.   Follow Up Recommendations  Acute inpatient rehab (3hours/day)    Assistance Recommended at Discharge Frequent or constant Supervision/Assistance  Patient can return home with the following A lot of help with walking and/or transfers;A lot of help with bathing/dressing/bathroom    Functional Status Assessment  Patient has had a recent decline in their functional status and demonstrates the ability to make significant improvements in function in a reasonable and predictable amount of time.  Equipment Recommendations  Other (comment) (Defer to next venue)    Recommendations for Other Services       Precautions / Restrictions Precautions Precautions: Fall;Other (comment) Precaution Comments: 2 JP drains in head Restrictions Weight Bearing Restrictions:  No      Mobility Bed Mobility Overal bed mobility: Needs Assistance Bed Mobility: Supine to Sit     Supine to sit: Min assist, HOB elevated     General bed mobility comments: Assist with trunk to get to EOB, increased time and cues.    Transfers Overall transfer level: Needs assistance Equipment used: Rolling walker (2 wheels) Transfers: Sit to/from Stand, Bed to chair/wheelchair/BSC Sit to Stand: Min assist, From elevated surface     Step pivot transfers: Min assist, +2 safety/equipment, +2 physical assistance     General transfer comment: Min A to power to standing with cues for hand placement, pt pulling up on RW. Able to take a few steps to get to chair with assist for RW management, weight shifting for LLE advancement and balance, leaning posteriorly when backing up to chair.      Balance Overall balance assessment: Needs assistance, History of Falls Sitting-balance support: Feet supported, No upper extremity supported Sitting balance-Leahy Scale: Fair Sitting balance - Comments: Assist needed to donn socks sitting EOB   Standing balance support: During functional activity, Reliant on assistive device for balance Standing balance-Leahy Scale: Poor Standing balance comment: Requires UE support in standing and external support for dynamic tasks/walking                           ADL either performed or assessed with clinical judgement   ADL Overall ADL's : Needs assistance/impaired Eating/Feeding: Set up;Sitting   Grooming: Set up;Sitting   Upper Body Bathing: Minimal assistance;Sitting   Lower Body Bathing: Maximal assistance;Sit to/from stand   Upper Body Dressing : Minimal assistance;Sitting  Lower Body Dressing: Maximal assistance;Sit to/from stand   Toilet Transfer: Ambulation;Minimal assistance;+2 for safety/equipment;Rolling walker (2 wheels) (simulated to recliner) Toilet Transfer Details (indicate cue type and reason): Min A for power up  and gaining balance. Min A for safe descent         Functional mobility during ADLs: Minimal assistance;Rolling walker (2 wheels) General ADL Comments: Pt presenting with L side weakness, decreased balance, and fatigue     Vision         Perception     Praxis      Pertinent Vitals/Pain Pain Assessment Pain Assessment: Faces Faces Pain Scale: Hurts little more Pain Location: head with movement Pain Descriptors / Indicators: Grimacing, Sore, Discomfort, Headache Pain Intervention(s): Monitored during session, Limited activity within patient's tolerance, Repositioned     Hand Dominance Right   Extremity/Trunk Assessment Upper Extremity Assessment Upper Extremity Assessment: LUE deficits/detail LUE Deficits / Details: Decreased strength compared to R LUE Coordination: decreased fine motor;decreased gross motor   Lower Extremity Assessment Lower Extremity Assessment: Defer to PT evaluation LLE Deficits / Details: Grossly ~3/5 hip flexion, 4/5 knee extension LLE Sensation: WNL   Cervical / Trunk Assessment Cervical / Trunk Assessment: Normal   Communication Communication Communication: HOH   Cognition Arousal/Alertness: Awake/alert Behavior During Therapy: WFL for tasks assessed/performed Overall Cognitive Status: Difficult to assess                                 General Comments: Pt very HOH so needs repetition at times. Laughs appropriately, follows commands well.     General Comments  Wife present during session.    Exercises     Shoulder Instructions      Home Living Family/patient expects to be discharged to:: Private residence Living Arrangements: Spouse/significant other Available Help at Discharge: Family;Available 24 hours/day Type of Home: House Home Access: Stairs to enter [employer] of Steps: 1 +1 Entrance Stairs-Rails: None Home Layout: One level     Bathroom Shower/Tub: Door;Walk-in Psychologist, prison and probation services:  Handicapped height     Home Equipment: [place] - single point;Shower seat          Prior Functioning/Environment Prior Level of Function : Independent/Modified Independent             Mobility Comments: recently using canes. Started falling in last month ADLs Comments: Performs ADLs and simple IADLs.        OT Problem List: Decreased strength;Decreased range of motion;Decreased activity tolerance;Impaired balance (sitting and/or standing);Decreased knowledge of use of DME or AE;Decreased knowledge of precautions      OT Treatment/Interventions: Therapeutic exercise;Self-care/ADL training;Energy conservation;DME and/or AE instruction;Therapeutic activities;Patient/family education    OT Goals(Current goals can be found in the care plan section) Acute Rehab OT Goals Patient Stated Goal: Get stronger OT Goal Formulation: With patient/family Time For Goal Achievement: 01/09/22 Potential to Achieve Goals: Good  OT Frequency: Min 2X/week    Co-evaluation PT/OT/SLP Co-Evaluation/Treatment: Yes Reason for Co-Treatment: For patient/therapist safety;To address functional/ADL transfers PT goals addressed during session: Mobility/safety with mobility;Balance;Strengthening/ROM;Proper use of DME OT goals addressed during session: ADL's and self-care      AM-PAC OT "6 Clicks" Daily Activity     Outcome Measure Help from another person eating meals?: A Little Help from another person taking care of personal grooming?: A Little Help from another person toileting, which includes using toliet, bedpan, or urinal?: A Lot Help from another person bathing (including  washing, rinsing, drying)?: A Lot Help from another person to put on and taking off regular upper body clothing?: A Little Help from another person to put on and taking off regular lower body clothing?: A Lot 6 Click Score: 15   End of Session Equipment Utilized During Treatment: Rolling walker (2 wheels);Gait belt Nurse  Communication: Mobility status (Patient nauseous; Pt not having take a pill and it was in his bed)  Activity Tolerance: Patient tolerated treatment well Patient left: in chair;with call bell/phone within reach;with chair alarm set;with family/visitor present  OT Visit Diagnosis: Unsteadiness on feet (R26.81);Other abnormalities of gait and mobility (R26.89);Muscle weakness (generalized) (M62.81)                Time: 4975-3005 OT Time Calculation (min): 29 min Charges:  OT General Charges $OT Visit: 1 Visit OT Evaluation $OT Eval Moderate Complexity: 1 Mod  Izick Gasbarro MSOT, OTR/L Acute Rehab Office: [place] 12/26/2021, 4:05 PM

## 2021-12-26 NOTE — Progress Notes (Signed)
Inpatient Rehab Admissions Coordinator Note:   Per therapy recommendations patient was screened for CIR candidacy by Michel Santee, PT. At this time, pt appears to be a potential candidate for CIR. I will place an order for rehab consult for full assessment, per our protocol.  Please contact me any with questions.Shann Medal, PT, DPT [phone] 12/26/21 3:59 PM 3

## 2021-12-26 NOTE — Progress Notes (Signed)
Subjective: Patient reports  significant improvement this morning minimal headache  Objective: Vital signs in last 24 hours: Temp:  [97 F (36.1 C)-98.9 F (37.2 C)] 98.2 F (36.8 C) (08/15 0400) Pulse Rate:  [63-89] 74 (08/15 0710) Resp:  [11-18] 18 (08/15 0710) BP: (139-176)/(60-131) 151/84 (08/15 0710) SpO2:  [90 %-99 %] 97 % (08/15 0710)  Intake/Output from previous day: 08/14 0701 - 08/15 0700 In: 1610 [I.V.:1200; IV Piggyback:410] Out: 1625 [Urine:800; Drains:625; Blood:200] Intake/Output this shift: No intake/output data recorded.  Patient awake and alert answers questions appropriately significantly improved left upper extremity function strength 4+ out of 5 has a component of neglect and apraxia on the left  Lab Results: Recent Labs    12/24/21 2035  WBC 6.5  HGB 13.8  HCT 39.6  PLT 128*   BMET Recent Labs    12/24/21 2035  NA 138  K 3.7  CL 106  CO2 23  GLUCOSE 166*  BUN 19  CREATININE 1.03  CALCIUM 9.0    Studies/Results: CT HEAD WO CONTRAST  Result Date: 12/26/2021 CLINICAL DATA:  76 year old male with bilateral subdural hematoma. Postoperative day 1 status post bilateral frontal and parietal burr holes, evacuation of subdural hematoma with drains. EXAM: CT HEAD WITHOUT CONTRAST TECHNIQUE: Contiguous axial images were obtained from the base of the skull through the vertex without intravenous contrast. RADIATION DOSE REDUCTION: This exam was performed according to the departmental dose-optimization program which includes automated exposure control, adjustment of the mA and/or kV according to patient size and/or use of iterative reconstruction technique. COMPARISON:  Head CT 12/25/2021 and earlier. FINDINGS: Brain: Bilateral subdural drains are in place. Small volume anterior frontal convexity bilateral postoperative pneumocephalus. Residual mixed density right side subdural hematoma now is up to 15 mm in thickness (previously 28 mm). Residual mixed density  left side subdural hematoma is up to 8-9 mm in thickness. And there is largely new posterior para falcine subdural blood up to 10 mm in thickness, a smaller volume layering along the tentorium (coronal image 49). Normalized basilar cistern patency. Improved lateral ventricle patency. And only mild leftward midline shift now at 3 mm (previously 6 mm). There is a small volume of posterior fossa subarachnoid hemorrhage (series 5, image 57 right cerebellar folia). And a small volume of left superior frontal gyrus SAH also. Trace intraventricular hemorrhage (left occipital horn). No ventriculomegaly. No cortically based acute infarct identified. Vascular: Calcified atherosclerosis at the skull base. Skull: New bilateral burr holes. Sinuses/Orbits: Visualized paranasal sinuses and mastoids are stable and well aerated. Other: Postoperative changes to the bilateral scalp. Small volume scalp hematoma and gas. Percutaneous drains bilaterally. Orbits soft tissues appear negative. IMPRESSION: 1. Postoperative day 1 bilateral subdural evacuation with drain placement. Decreased bilateral subdural hematomas, residual up to 15 mm in on the right and 8-9 mm on the left. Probably re-distributed para falcine and tentorial SDH up to 10 mm. 2. New small volume subarachnoid hemorrhage in the posterior fossa and left superior frontal gyrus. Trace IVH. 3. Decreased leftward midline shift, now 3 mm. Normalized basilar cisterns. Electronically Signed   By: Genevie Ann M.D.   On: 12/26/2021 04:01   CT HEAD WO CONTRAST (5MM)  Result Date: 12/25/2021 CLINICAL DATA:  Subdural hematoma, increasing confusion EXAM: CT HEAD WITHOUT CONTRAST TECHNIQUE: Contiguous axial images were obtained from the base of the skull through the vertex without intravenous contrast. RADIATION DOSE REDUCTION: This exam was performed according to the departmental dose-optimization program which includes automated exposure control, adjustment of  the mA and/or kV according  to patient size and/or use of iterative reconstruction technique. COMPARISON:  12/24/2021 FINDINGS: Brain: Bilateral acute on chronic subdural hematomas are again identified, minimally increased in size since prior study. The right-sided collection measures 2.8 cm in maximal dimension and left-sided collection measures 1.5 cm in maximal dimension, previously measuring 2.5 and 1.5 cm respectively. No new areas of acute hemorrhage. Stable areas of acute subdural hematoma extending along the falx. There has been interval hematocrit effect with layering of blood products within the bilateral subdural collections. There is persistent leftward midline shift measuring approximately 6 mm at the level of the septum pellucidum, previously measuring 5 mm. Stable effacement of the lateral ventricles, right greater than left. No evidence of acute infarct.  Midline structures are stable. Vascular: Stable atherosclerosis. Skull: Stable left occipital scalp laceration with skin staples in place. Negative for fracture or focal lesion. Sinuses/Orbits: No acute finding. Other: None. IMPRESSION: 1. Bilateral acute on chronic subdural hematomas as above, with minimal increase in size since prior study. Stable mass effect and leftward midline shift as described above. 2. Left occipital scalp laceration with skin staples in place. Electronically Signed   By: Randa Ngo M.D.   On: 12/25/2021 11:45   CT Head Wo Contrast  Result Date: 12/24/2021 CLINICAL DATA:  Initial evaluation for acute trauma. EXAM: CT HEAD WITHOUT CONTRAST CT CERVICAL SPINE WITHOUT CONTRAST TECHNIQUE: Multidetector CT imaging of the head and cervical spine was performed following the standard protocol without intravenous contrast. Multiplanar CT image reconstructions of the cervical spine were also generated. RADIATION DOSE REDUCTION: This exam was performed according to the departmental dose-optimization program which includes automated exposure control,  adjustment of the mA and/or kV according to patient size and/or use of iterative reconstruction technique. COMPARISON:  None Available. FINDINGS: CT HEAD FINDINGS Brain: Large acute on subacute to chronic bilateral subdural hematomas are seen. Collection on the right measures up to 2.5 cm in diameter, with the collection on the left measuring up to 1.5 cm in diameter. Associated mass effect with 5 mm of right-to-left shift. Acute subdural blood seen extending along the falx and tentorium. Probable small associated subarachnoid component at the mid left frontal region (series 3, image 27). No other parenchymal or intra ventricular hemorrhage. No hydrocephalus or trapping. Basilar cisterns remain patent. No visible acute large vessel territory infarct or mass. Underlying mild chronic microvascular ischemic disease. Vascular: No hyperdense vessel. Calcified atherosclerosis present at the skull base. Skull: Soft tissue contusion with laceration at the left occipital scalp. No calvarial fracture. Sinuses/Orbits: Globes orbital soft tissues within normal limits. Paranasal sinuses and mastoid air cells are clear. Other: None. CT CERVICAL SPINE FINDINGS Alignment: Straightening of the normal cervical lordosis. No listhesis or malalignment. Skull base and vertebrae: Skull base intact. Normal C1-2 articulations are preserved in the dens is intact. Vertebral body heights maintained. No acute fracture. Soft tissues and spinal canal: Soft tissues of the neck demonstrate no acute finding. No abnormal prevertebral edema. Moderate atheromatous change about the carotid bifurcations. Spinal canal demonstrates no visible acute finding. Disc levels: Moderate degenerative spondylosis seen C3-4 through C7-T1. Upper chest: Small focus of ground-glass opacity at the mid right lung apex, nonspecific, but possibly a small focus of mild pneumonitis (series 4, image 105). Underlying mild emphysema. Visualized upper chest demonstrates no other  acute finding. Other: None. IMPRESSION: CT BRAIN: 1. Large acute on subacute to chronic bilateral subdural hematomas, measuring up to 2.5 cm on the right and 1.5 cm  on the left. Extension along the falx and tentorium. Associated mass effect with up to 5 mm of right-to-left shift. 2. Soft tissue contusion with laceration at the left occipital scalp. No calvarial fracture. 3. Underlying mild chronic microvascular ischemic disease. CT CERVICAL SPINE: 1. No acute traumatic injury within the cervical spine. 2. Moderate degenerative spondylosis C3-4 through C7-T1. 3. Small focus of ground-glass opacity at the mid right lung apex, nonspecific, but possibly mild pneumonitis. Critical Value/emergent results were called by telephone at the time of interpretation on 12/24/2021 at 10:00 pm to provider [facility] , who verbally acknowledged these results. Electronically Signed   By: Jeannine Boga M.D.   On: 12/24/2021 22:05   CT Cervical Spine Wo Contrast  Result Date: 12/24/2021 CLINICAL DATA:  Initial evaluation for acute trauma. EXAM: CT HEAD WITHOUT CONTRAST CT CERVICAL SPINE WITHOUT CONTRAST TECHNIQUE: Multidetector CT imaging of the head and cervical spine was performed following the standard protocol without intravenous contrast. Multiplanar CT image reconstructions of the cervical spine were also generated. RADIATION DOSE REDUCTION: This exam was performed according to the departmental dose-optimization program which includes automated exposure control, adjustment of the mA and/or kV according to patient size and/or use of iterative reconstruction technique. COMPARISON:  None Available. FINDINGS: CT HEAD FINDINGS Brain: Large acute on subacute to chronic bilateral subdural hematomas are seen. Collection on the right measures up to 2.5 cm in diameter, with the collection on the left measuring up to 1.5 cm in diameter. Associated mass effect with 5 mm of right-to-left shift. Acute subdural blood seen extending  along the falx and tentorium. Probable small associated subarachnoid component at the mid left frontal region (series 3, image 27). No other parenchymal or intra ventricular hemorrhage. No hydrocephalus or trapping. Basilar cisterns remain patent. No visible acute large vessel territory infarct or mass. Underlying mild chronic microvascular ischemic disease. Vascular: No hyperdense vessel. Calcified atherosclerosis present at the skull base. Skull: Soft tissue contusion with laceration at the left occipital scalp. No calvarial fracture. Sinuses/Orbits: Globes orbital soft tissues within normal limits. Paranasal sinuses and mastoid air cells are clear. Other: None. CT CERVICAL SPINE FINDINGS Alignment: Straightening of the normal cervical lordosis. No listhesis or malalignment. Skull base and vertebrae: Skull base intact. Normal C1-2 articulations are preserved in the dens is intact. Vertebral body heights maintained. No acute fracture. Soft tissues and spinal canal: Soft tissues of the neck demonstrate no acute finding. No abnormal prevertebral edema. Moderate atheromatous change about the carotid bifurcations. Spinal canal demonstrates no visible acute finding. Disc levels: Moderate degenerative spondylosis seen C3-4 through C7-T1. Upper chest: Small focus of ground-glass opacity at the mid right lung apex, nonspecific, but possibly a small focus of mild pneumonitis (series 4, image 105). Underlying mild emphysema. Visualized upper chest demonstrates no other acute finding. Other: None. IMPRESSION: CT BRAIN: 1. Large acute on subacute to chronic bilateral subdural hematomas, measuring up to 2.5 cm on the right and 1.5 cm on the left. Extension along the falx and tentorium. Associated mass effect with up to 5 mm of right-to-left shift. 2. Soft tissue contusion with laceration at the left occipital scalp. No calvarial fracture. 3. Underlying mild chronic microvascular ischemic disease. CT CERVICAL SPINE: 1. No acute  traumatic injury within the cervical spine. 2. Moderate degenerative spondylosis C3-4 through C7-T1. 3. Small focus of ground-glass opacity at the mid right lung apex, nonspecific, but possibly mild pneumonitis. Critical Value/emergent results were called by telephone at the time of interpretation on 12/24/2021 at 10:00  pm to provider Lajean Saver , who verbally acknowledged these results. Electronically Signed   By: Jeannine Boga M.D.   On: 12/24/2021 22:05    Assessment/Plan: Postop day 1 bur hole craniectomy for evacuation of bilateral subdural hematomas.  Postop CT scan looks excellent with significant evacuation of the hematoma is less mass effect patient clinically much improved we will discuss with Dr. Trenton Gammon timing of removal of JP drains but continue to mobilize advance his diet  LOS: 2 days     Elaina Hoops 12/26/2021, 7:47 AM

## 2021-12-27 MED ORDER — ORAL CARE MOUTH RINSE: 15.0000 mL | OROMUCOSAL | Status: DC | PRN

## 2021-12-27 NOTE — Progress Notes (Signed)
Patient looks good today.  Still some minimal left-sided neglect but overall his strength is good.  He is wide awake.  He is answering questions.  Drain output remains significant.  Overall progressing well.  Plan follow-up CT scan tomorrow and hopeful removal of drains.

## 2021-12-27 NOTE — Progress Notes (Signed)
Inpatient Rehab Coordinator Note:  I met with pt and his spouse at bedside to discuss CIR recommendations and goals/expectations of CIR stay.  We reviewed 3 hrs/day of therapy, physician follow up, and average length of stay 2 weeks (dependent upon progress) with goals of supervision.  We discussed need for prior auth from [facility] prior to CIR admission.  Note plans for repeat head CT tomorrow, so would like to see how that goes and tentatively open insurance tomorrow if he's ready.  Shann Medal, PT, DPT Admissions Coordinator [phone] 12/27/21  12:39 PM

## 2021-12-27 NOTE — Progress Notes (Signed)
Physical Therapy Treatment Patient Details Name: Brandon [name] MRN: [id number] DOB: 11-21-1945 Today's Date: 12/27/2021   History of Present Illness Patient is a 76 y/o male who presents on 8/14 with a fall out of bed. Head CT- bilateral SDH with midline shift. s/p craniectomy for evacuation of bilateral subdural hematomas 12/25/21. PMH includes HTN.    PT Comments    Patient able to progress ambulation distance this session. Requires minA+2 for short ambulation distance in room with RW. Patient continues to demonstrate posterior lean during backwards stepping. Patient requires increased time to process commands but unsure if due to [facility] at baseline or cognition. Continue to recommend acute inpatient rehab (AIR) for post-acute therapy needs.     Recommendations for follow up therapy are one component of a multi-disciplinary discharge planning process, led by the attending physician.  Recommendations may be updated based on patient status, additional functional criteria and insurance authorization.  Follow Up Recommendations  Acute inpatient rehab (3hours/day)     Assistance Recommended at Discharge Frequent or constant Supervision/Assistance  Patient can return home with the following Two people to help with walking and/or transfers;A lot of help with bathing/dressing/bathroom;Assistance with cooking/housework;Assist for transportation;Help with stairs or ramp for entrance   Equipment Recommendations  Other (comment) (defer to next venue of care)    Recommendations for Other Services       Precautions / Restrictions Precautions Precautions: Fall;Other (comment) Precaution Comments: 2 JP drains in head Restrictions Weight Bearing Restrictions: No     Mobility  Bed Mobility Overal bed mobility: Needs Assistance Bed Mobility: Supine to Sit     Supine to sit: Min assist, HOB elevated     General bed mobility comments: assist with trunk to reach EOB    Transfers Overall  transfer level: Needs assistance Equipment used: Rolling Ector Laurel (2 wheels) Transfers: Sit to/from Stand, Bed to chair/wheelchair/BSC Sit to Stand: Min assist, Mod assist, +2 safety/equipment   Step pivot transfers: Min assist, +2 safety/equipment, +2 physical assistance       General transfer comment: minA to stand from EOB but modA to stand from low recliner. Cues for hand placement on ascent and descent. MinA+2 to take steps towards recliner but required assist during backwards stepping due to posterior lean    Ambulation/Gait Ambulation/Gait assistance: Min assist, +2 safety/equipment Gait Distance (Feet): 25 Feet Assistive device: Rolling Jere Bostrom (2 wheels) Gait Pattern/deviations: Step-through pattern, Decreased stride length, Trunk flexed Gait velocity: decreased     General Gait Details: minA for balance and RW management as patient tends to push too far out in front of him. +chair follow.   Stairs             Wheelchair Mobility    Modified Rankin (Stroke Patients Only)       Balance Overall balance assessment: Needs assistance, History of Falls Sitting-balance support: Feet supported, No upper extremity supported Sitting balance-Leahy Scale: Fair     Standing balance support: During functional activity, Reliant on assistive device for balance Standing balance-Leahy Scale: Poor                              Cognition Arousal/Alertness: Awake/alert Behavior During Therapy: WFL for tasks assessed/performed Overall Cognitive Status: Impaired/Different from baseline Area of Impairment: Following commands, Problem solving                       Following Commands: Follows one step commands with  increased time     Problem Solving: Requires verbal cues          Exercises      General Comments General comments (skin integrity, edema, etc.): VSS on RA      Pertinent Vitals/Pain Pain Assessment Pain Assessment: No/denies  pain Pain Intervention(s): Monitored during session    Home Living                          Prior Function            PT Goals (current goals can now be found in the care plan section) Acute Rehab PT Goals Patient Stated Goal: to get better and go home PT Goal Formulation: With patient/family Time For Goal Achievement: 01/09/22 Potential to Achieve Goals: Good Progress towards PT goals: Progressing toward goals    Frequency    Min 4X/week      PT Plan Current plan remains appropriate    Co-evaluation   Reason for Co-Treatment: Complexity of the patient's impairments (multi-system involvement)          AM-PAC PT "6 Clicks" Mobility   Outcome Measure  Help needed turning from your back to your side while in a flat bed without using bedrails?: A Little Help needed moving from lying on your back to sitting on the side of a flat bed without using bedrails?: A Little Help needed moving to and from a bed to a chair (including a wheelchair)?: A Little Help needed standing up from a chair using your arms (e.g., wheelchair or bedside chair)?: A Lot Help needed to walk in hospital room?: Total Help needed climbing 3-5 steps with a railing? : Total 6 Click Score: 13    End of Session Equipment Utilized During Treatment: Gait belt Activity Tolerance: Patient tolerated treatment well Patient left: in chair;with call bell/phone within reach;with chair alarm set;with nursing/sitter in room;with family/visitor present Nurse Communication: Mobility status PT Visit Diagnosis: Pain;Hemiplegia and hemiparesis;History of falling (Z91.81);Unsteadiness on feet (R26.81);Difficulty in walking, not elsewhere classified (R26.2) Hemiplegia - Right/Left: Left Hemiplegia - dominant/non-dominant: Non-dominant Hemiplegia - caused by: Unspecified     Time: 7001-7494 PT Time Calculation (min) (ACUTE ONLY): 23 min  Charges:  $Gait Training: 8-22 mins                     Aurilla Coulibaly  A. Gilford Rile PT, DPT Acute Rehabilitation Services Office [phone]    Linna Hoff 12/27/2021, 3:30 PM

## 2021-12-27 NOTE — Progress Notes (Signed)
Occupational Therapy Treatment Patient Details Name: Brandon [name] MRN: [id number] DOB: [birth year] Today's Date: 12/27/2021   History of present illness Patient is a 76 y/o male who presents on 8/14 with a fall out of bed. Head CT- bilateral SDH with midline shift. s/p craniectomy for evacuation of bilateral subdural hematomas 12/25/21. PMH includes HTN.   OT comments  Patient with fair progress toward patient focused goals.  Able to sit EOB with Min A, stand and transfer to recliner with initial Min  of 2, but then closer to [place] of one.  Patient needing cues and increased time to process commands, but he is also HOH.  OT to continue efforts in the acute setting, with AIR continuing to be recommended for post acute rehab.     Recommendations for follow up therapy are one component of a multi-disciplinary discharge planning process, led by the attending physician.  Recommendations may be updated based on patient status, additional functional criteria and insurance authorization.    Follow Up Recommendations  Acute inpatient rehab (3hours/day)    Assistance Recommended at Discharge Frequent or constant Supervision/Assistance  Patient can return home with the following  A lot of help with walking and/or transfers;A lot of help with bathing/dressing/bathroom   Equipment Recommendations  Other (comment)    Recommendations for Other Services      Precautions / Restrictions Precautions Precautions: Fall;Other (comment) Precaution Comments: 2 JP drains in head Restrictions Weight Bearing Restrictions: No       Mobility Bed Mobility   Bed Mobility: Supine to Sit     Supine to sit: Min assist, HOB elevated          Transfers Overall transfer level: Needs assistance Equipment used: Rolling walker (2 wheels) Transfers: Sit to/from Stand, Bed to chair/wheelchair/BSC Sit to Stand: Min assist, Mod assist     Step pivot transfers: Min assist, +2 safety/equipment, +2 physical  assistance           Balance Overall balance assessment: Needs assistance, History of Falls Sitting-balance support: Feet supported, No upper extremity supported Sitting balance-Leahy Scale: Fair     Standing balance support: During functional activity, Reliant on assistive device for balance Standing balance-Leahy Scale: Poor                             ADL either performed or assessed with clinical judgement   ADL               Lower Body Bathing: Moderate assistance;Sit to/from stand           Toilet Transfer: Ambulation;Minimal assistance;+2 for safety/equipment;Rolling walker (2 wheels)                    Cognition Arousal/Alertness: Awake/alert Behavior During Therapy: WFL for tasks assessed/performed Overall Cognitive Status: Impaired/Different from baseline Area of Impairment: Following commands, Problem solving                       Following Commands: Follows one step commands with increased time     Problem Solving: Requires verbal cues                             Pertinent Vitals/ Pain       Pain Assessment Pain Assessment: No/denies pain Pain Intervention(s): Monitored during session  Frequency  Min 2X/week        Progress Toward Goals  OT Goals(current goals can now be found in the care plan section)  Progress towards OT goals: Progressing toward goals  Acute Rehab OT Goals OT Goal Formulation: With patient/family Time For Goal Achievement: 01/09/22 Potential to Achieve Goals: Good  Plan Discharge plan remains appropriate    Co-evaluation    PT/OT/SLP Co-Evaluation/Treatment: Yes Reason for Co-Treatment: Complexity of the patient's impairments (multi-system involvement)          AM-PAC OT "6 Clicks" Daily Activity     Outcome Measure   Help from another person eating meals?: A Little Help from  another person taking care of personal grooming?: A Little Help from another person toileting, which includes using toliet, bedpan, or urinal?: A Lot Help from another person bathing (including washing, rinsing, drying)?: A Lot Help from another person to put on and taking off regular upper body clothing?: A Little Help from another person to put on and taking off regular lower body clothing?: A Lot 6 Click Score: 15    End of Session Equipment Utilized During Treatment: Rolling walker (2 wheels);Gait belt  OT Visit Diagnosis: Unsteadiness on feet (R26.81);Other abnormalities of gait and mobility (R26.89);Muscle weakness (generalized) (M62.81)   Activity Tolerance Patient tolerated treatment well   Patient Left in chair;with call bell/phone within reach;with chair alarm set;with family/visitor present   Nurse Communication Mobility status        Time: 1829-9371 OT Time Calculation (min): 23 min  Charges: OT General Charges $OT Visit: 1 Visit OT Treatments $Self Care/Home Management : 8-22 mins  12/27/2021  RP, OTR/L  Acute Rehabilitation Services  Office:  [phone]   Metta Clines 12/27/2021, 12:54 PM

## 2021-12-28 ENCOUNTER — Inpatient Hospital Stay (HOSPITAL_COMMUNITY): Payer: Medicare HMO

## 2021-12-28 MED ORDER — PANTOPRAZOLE SODIUM 40 MG PO TBEC
40.0000 mg | DELAYED_RELEASE_TABLET | Freq: Every day | ORAL | Status: DC
  Administered 2021-12-28 – 2021-12-29 (×2): 40 mg via ORAL
  Filled 2021-12-28 (×2): qty 1

## 2021-12-28 MED ORDER — LEVETIRACETAM 500 MG PO TABS
500.0000 mg | ORAL_TABLET | Freq: Two times a day (BID) | ORAL | Status: DC
  Administered 2021-12-28: 500 mg via ORAL
  Filled 2021-12-28: qty 1

## 2021-12-28 MED ORDER — LEVETIRACETAM 500 MG PO TABS
500.0000 mg | ORAL_TABLET | Freq: Two times a day (BID) | ORAL | Status: DC
  Administered 2021-12-28 – 2021-12-30 (×4): 500 mg via ORAL
  Filled 2021-12-28 (×4): qty 1

## 2021-12-28 NOTE — Progress Notes (Signed)
No events or problems overnight.  Patient looks good.  Minimal headache.  No numbness paresthesias or weakness.  He is afebrile.  His vital signs are stable.  His urine output is good.  His drain output has lessened.  Follow-up head CT scan demonstrates near complete resolution of his left-sided subacute subdural.  There is still some residual acute and chronic blood in his left subdural space but this is much improved from preop.  Patient is awake and alert.  He is oriented and appropriate.  Still has a little bit of left-sided neglect but otherwise is doing very well.  Wounds are healing well.  I removed the drains bilaterally today.  Progressing well.  Continue ICU observation.  Continue efforts at mobilization.  Progressing toward rehabilitation discharge.

## 2021-12-28 NOTE — Progress Notes (Signed)
Inpatient Rehab Admissions Coordinator:   Spoke to NS team and pt will be ready to transition to CIR as early as tomorrow, pending insurance determination.  Will submit for auth today.   Shann Medal, PT, DPT Admissions Coordinator [phone] 12/28/21  11:30 AM

## 2021-12-28 NOTE — Progress Notes (Signed)
Inpatient Rehab Admissions Coordinator:   Received standard request from [facility] for updated clinicals and contact info for potential peer to peer. Sent updated PT note and provided contact info for Viona Gilmore, NP, at her consent.  Will follow.    Shann Medal, PT, DPT Admissions Coordinator [phone] 12/28/21  5:02 PM

## 2021-12-28 NOTE — Progress Notes (Signed)
Physical Therapy Treatment Patient Details Name: Brandon [name] MRN: [id number] DOB: August 09, 1945 Today's Date: 12/28/2021   History of Present Illness Patient is a 76 y/o male who presents on 8/14 with a fall out of bed. Head CT- bilateral SDH with midline shift. s/p craniectomy for evacuation of bilateral subdural hematomas 12/25/21. PMH includes HTN.    PT Comments    The pt was agreeable to session, but reports limited by pain in R knee at this time. He was able to complete multiple sit-stand transfers as well as two short bouts of ambulation in room with minA and use of RW. The pt requires assist to manage RW, and to correct R lateral lean. As well as needing increased assist and cues to manage eccentric lower to recliner. Pt remains at high risk for falls and will benefit from acute inpatient rehab to improve safety with mobility and reduce risk of future falls and fall-related injury.     Recommendations for follow up therapy are one component of a multi-disciplinary discharge planning process, led by the attending physician.  Recommendations may be updated based on patient status, additional functional criteria and insurance authorization.  Follow Up Recommendations  Acute inpatient rehab (3hours/day)     Assistance Recommended at Discharge Frequent or constant Supervision/Assistance  Patient can return home with the following Two people to help with walking and/or transfers;A lot of help with bathing/dressing/bathroom;Assistance with cooking/housework;Assist for transportation;Help with stairs or ramp for entrance   Equipment Recommendations  Other (comment) (defer to next venue of care)    Recommendations for Other Services       Precautions / Restrictions Precautions Precautions: Fall Restrictions Weight Bearing Restrictions: No     Mobility  Bed Mobility Overal bed mobility: Needs Assistance             General bed mobility comments: pt sitting in recliner at  start and end of session    Transfers Overall transfer level: Needs assistance Equipment used: Rolling walker (2 wheels) Transfers: Sit to/from Stand, Bed to chair/wheelchair/BSC Sit to Stand: Min assist, Mod assist   Step pivot transfers: Min assist       General transfer comment: minA to power up from low surface with cues for hand positioning. poor eccentric lower needing increased assist    Ambulation/Gait Ambulation/Gait assistance: Min assist, +2 safety/equipment (chair follow) Gait Distance (Feet): 15 Feet (+ 35f) Assistive device: Rolling walker (2 wheels) Gait Pattern/deviations: Step-through pattern, Decreased stride length, Trunk flexed, Narrow base of support Gait velocity: decreased     General Gait Details: narrow BOS with poor wt acceptance on RLE due to knee pain. leaning to R     Modified Rankin (Stroke Patients Only) Modified Rankin (Stroke Patients Only) Pre-Morbid Rankin Score: Slight disability Modified Rankin: Moderately severe disability     Balance Overall balance assessment: Needs assistance, History of Falls Sitting-balance support: Feet supported, No upper extremity supported Sitting balance-Leahy Scale: Fair     Standing balance support: During functional activity, Reliant on assistive device for balance Standing balance-Leahy Scale: Poor Standing balance comment: Requires UE support in standing and external support for dynamic tasks/walking                            Cognition Arousal/Alertness: Awake/alert Behavior During Therapy: WFL for tasks assessed/performed Overall Cognitive Status: Impaired/Different from baseline Area of Impairment: Following commands, Problem solving  Following Commands: Follows one step commands with increased time     Problem Solving: Requires verbal cues General Comments: pt very HOH, needs repetition at times despite hearing aides.        Exercises       General Comments General comments (skin integrity, edema, etc.): VSS on RA, SpO2 96%      Pertinent Vitals/Pain Pain Assessment Pain Assessment: Faces Faces Pain Scale: Hurts even more Pain Location: R knee Pain Descriptors / Indicators: Discomfort Pain Intervention(s): Monitored during session, Limited activity within patient's tolerance, Repositioned, Patient requesting pain meds-RN notified     PT Goals (current goals can now be found in the care plan section) Acute Rehab PT Goals Patient Stated Goal: to get better and go home PT Goal Formulation: With patient/family Time For Goal Achievement: 01/09/22 Potential to Achieve Goals: Good Progress towards PT goals: Progressing toward goals    Frequency    Min 4X/week      PT Plan Current plan remains appropriate       AM-PAC PT "6 Clicks" Mobility   Outcome Measure  Help needed turning from your back to your side while in a flat bed without using bedrails?: A Little Help needed moving from lying on your back to sitting on the side of a flat bed without using bedrails?: A Little Help needed moving to and from a bed to a chair (including a wheelchair)?: A Little Help needed standing up from a chair using your arms (e.g., wheelchair or bedside chair)?: A Lot Help needed to walk in hospital room?: Total Help needed climbing 3-5 steps with a railing? : Total 6 Click Score: 13    End of Session Equipment Utilized During Treatment: Gait belt Activity Tolerance: Patient tolerated treatment well Patient left: in chair;with call bell/phone within reach;with chair alarm set;with family/visitor present Nurse Communication: Mobility status;Patient requests pain meds PT Visit Diagnosis: Pain;Hemiplegia and hemiparesis;History of falling (Z91.81);Unsteadiness on feet (R26.81);Difficulty in walking, not elsewhere classified (R26.2) Hemiplegia - Right/Left: Left Hemiplegia - dominant/non-dominant: Non-dominant Hemiplegia - caused  by: Unspecified Pain - Right/Left: Right Pain - part of body: Knee     Time: 9675-9163 PT Time Calculation (min) (ACUTE ONLY): 27 min  Charges:  $Gait Training: 8-22 mins $Therapeutic Exercise: 8-22 mins                     West Carbo, PT, DPT   Acute Rehabilitation Department   Sandra Cockayne 12/28/2021, 12:57 PM

## 2021-12-29 MED ORDER — BISACODYL 10 MG RE SUPP
10.0000 mg | Freq: Every day | RECTAL | Status: DC | PRN
  Administered 2021-12-30: 10 mg via RECTAL
  Filled 2021-12-29: qty 1

## 2021-12-29 MED ORDER — POLYETHYLENE GLYCOL 3350 17 G PO PACK
17.0000 g | PACK | Freq: Every day | ORAL | Status: DC
  Administered 2021-12-29 – 2021-12-30 (×2): 17 g via ORAL
  Filled 2021-12-29 (×2): qty 1

## 2021-12-29 NOTE — Progress Notes (Signed)
Physical Therapy Treatment Patient Details Name: Brandon [name] MRN: [id number] DOB: 09-19-1945 Today's Date: 12/29/2021   History of Present Illness 76 y/o male presenting 8/14 with a fall out of bed. Head CT- bilateral SDH with midline shift. s/p craniectomy for evacuation of bilateral subdural hematomas 12/25/21. PMH includes HTN.    PT Comments    The pt was able to make good continued progress with OOB mobility, power, and endurance, but remains limited by poor dynamic stability and balance reactions. Pt needing max cues for gait corrections at this time and up to maxA with LOB to correct even with BUE support. The pt remains at significant risk of falls and will benefit from max skilled PT to improve safety with mobility.     Recommendations for follow up therapy are one component of a multi-disciplinary discharge planning process, led by the attending physician.  Recommendations may be updated based on patient status, additional functional criteria and insurance authorization.  Follow Up Recommendations  Acute inpatient rehab (3hours/day)     Assistance Recommended at Discharge Frequent or constant Supervision/Assistance  Patient can return home with the following Two people to help with walking and/or transfers;A lot of help with bathing/dressing/bathroom;Assistance with cooking/housework;Assist for transportation;Help with stairs or ramp for entrance   Equipment Recommendations  Other (comment) (defer to post acute)    Recommendations for Other Services       Precautions / Restrictions Precautions Precautions: Fall Restrictions Weight Bearing Restrictions: No     Mobility  Bed Mobility Overal bed mobility: Needs Assistance Bed Mobility: Supine to Sit     Supine to sit: Min assist, HOB elevated     General bed mobility comments: Min A for stabilizing trunk and to gain sitting balance, multiple posterior LOB until stabilized with feet on floor. max cues for forwards  lean/correction    Transfers Overall transfer level: Needs assistance Equipment used: Rolling walker (2 wheels) Transfers: Sit to/from Stand, Bed to chair/wheelchair/BSC Sit to Stand: Min assist, Mod assist           General transfer comment: Mod A for initial sit<>stand. Min A +2 for remaining stands    Ambulation/Gait Ambulation/Gait assistance: Min assist, Max assist, +2 safety/equipment Gait Distance (Feet): 15 Feet (+ 25 + 25) Assistive device: Rolling walker (2 wheels) Gait Pattern/deviations: Step-through pattern, Decreased stride length, Trunk flexed, Narrow base of support, Staggering left Gait velocity: decreased     General Gait Details: pt drifting to L with tendency to get feet outside RW support and then had single episode of LOB requiring maxA of 1 and modA of +2. benefits from max cues for increased BOS and step length    Modified Rankin (Stroke Patients Only) Modified Rankin (Stroke Patients Only) Pre-Morbid Rankin Score: Slight disability Modified Rankin: Moderately severe disability     Balance Overall balance assessment: Needs assistance, History of Falls Sitting-balance support: Feet supported, No upper extremity supported Sitting balance-Leahy Scale: Fair Sitting balance - Comments: iniital posterior lean. Min A for gaining balance   Standing balance support: During functional activity, Reliant on assistive device for balance Standing balance-Leahy Scale: Poor Standing balance comment: Requires UE support in standing and external support for dynamic tasks/walking                            Cognition Arousal/Alertness: Awake/alert Behavior During Therapy: WFL for tasks assessed/performed Overall Cognitive Status: Impaired/Different from baseline Area of Impairment: Following commands, Problem solving  Following Commands: Follows one step commands with increased time     Problem Solving: Requires  verbal cues, Slow processing General Comments: Requiring repeated cues and increased time.        Exercises      General Comments General comments (skin integrity, edema, etc.): wife and son present      Pertinent Vitals/Pain Pain Assessment Pain Assessment: Faces Faces Pain Scale: Hurts even more Pain Location: R knee Pain Descriptors / Indicators: Discomfort Pain Intervention(s): Monitored during session, Repositioned, Patient requesting pain meds-RN notified     PT Goals (current goals can now be found in the care plan section) Acute Rehab PT Goals Patient Stated Goal: to get better and go home PT Goal Formulation: With patient/family Time For Goal Achievement: 01/09/22 Potential to Achieve Goals: Good Progress towards PT goals: Progressing toward goals    Frequency    Min 4X/week      PT Plan Current plan remains appropriate    Co-evaluation PT/OT/SLP Co-Evaluation/Treatment: Yes Reason for Co-Treatment: For patient/therapist safety;To address functional/ADL transfers PT goals addressed during session: Mobility/safety with mobility;Balance;Proper use of DME OT goals addressed during session: ADL's and self-care      AM-PAC PT "6 Clicks" Mobility   Outcome Measure  Help needed turning from your back to your side while in a flat bed without using bedrails?: A Little Help needed moving from lying on your back to sitting on the side of a flat bed without using bedrails?: A Little Help needed moving to and from a bed to a chair (including a wheelchair)?: A Little Help needed standing up from a chair using your arms (e.g., wheelchair or bedside chair)?: A Lot Help needed to walk in hospital room?: Total Help needed climbing 3-5 steps with a railing? : Total 6 Click Score: 13    End of Session Equipment Utilized During Treatment: Gait belt Activity Tolerance: Patient tolerated treatment well Patient left: in chair;with call bell/phone within reach;with chair  alarm set;with family/visitor present Nurse Communication: Mobility status;Patient requests pain meds PT Visit Diagnosis: Pain;Hemiplegia and hemiparesis;History of falling (Z91.81);Unsteadiness on feet (R26.81);Difficulty in walking, not elsewhere classified (R26.2) Hemiplegia - Right/Left: Left Hemiplegia - dominant/non-dominant: Non-dominant Hemiplegia - caused by: Unspecified Pain - Right/Left: Right Pain - part of body: Knee     Time: 7494-4967 PT Time Calculation (min) (ACUTE ONLY): 25 min  Charges:  $Gait Training: 8-22 mins                     West Carbo, PT, DPT   Acute Rehabilitation Department   Sandra Cockayne 12/29/2021, 5:46 PM

## 2021-12-29 NOTE — Progress Notes (Signed)
Overall progressing well.  Minimal headache.  Patient awake and alert.  He is oriented and appropriate.  His motor function is 5/5 bilaterally.  No pronator drift.  Wounds are healing well.  Abdomen soft.  Chest clear.  Progressing well following bilateral bur hole evacuation of subdural hematoma.  Continue efforts at mobilization.  Patient ready for rehab once bed available.

## 2021-12-29 NOTE — Progress Notes (Signed)
Occupational Therapy Treatment Patient Details Name: Brandon [name] MRN: [id number] DOB: 1945-12-19 Today's Date: 12/29/2021   History of present illness 76 y/o male who presents on 8/14 with a fall out of bed. Head CT- bilateral SDH with midline shift. s/p craniectomy for evacuation of bilateral subdural hematomas 12/25/21. PMH includes HTN.   OT comments  Pt progressing towards established OT goals and very motivated to participate despite fatigue. Pt performing bed mobility with Min A for gaining sitting balance. Pt requiring Mod A +2 for initial sit<>stand but then completing remaining transfer with Min A +2. Requiring Min-Mod A +2 and Rw for functional mobility; two bouts of walking in hallway with chair follow. Pt with difficulty maintaining balance and managing RW during turns. Continue to recommend dc to AIR and will continue to follow acutely as admitted.    Recommendations for follow up therapy are one component of a multi-disciplinary discharge planning process, led by the attending physician.  Recommendations may be updated based on patient status, additional functional criteria and insurance authorization.    Follow Up Recommendations  Acute inpatient rehab (3hours/day)    Assistance Recommended at Discharge Frequent or constant Supervision/Assistance  Patient can return home with the following  A lot of help with walking and/or transfers;A lot of help with bathing/dressing/bathroom   Equipment Recommendations  Other (comment)    Recommendations for Other Services      Precautions / Restrictions Precautions Precautions: Fall Restrictions Weight Bearing Restrictions: No       Mobility Bed Mobility Overal bed mobility: Needs Assistance Bed Mobility: Supine to Sit     Supine to sit: Min assist, HOB elevated     General bed mobility comments: Min A for stabilizing trunk and to gain sitting balance    Transfers Overall transfer level: Needs assistance Equipment  used: Rolling walker (2 wheels) Transfers: Sit to/from Stand, Bed to chair/wheelchair/BSC Sit to Stand: Min assist, Mod assist           General transfer comment: Mod A for initial sit<>stand. Min A +2 for remaining stands     Balance Overall balance assessment: Needs assistance, History of Falls Sitting-balance support: Feet supported, No upper extremity supported Sitting balance-Leahy Scale: Fair Sitting balance - Comments: iniital posterior lean. Min A for gaining balance   Standing balance support: During functional activity, Reliant on assistive device for balance Standing balance-Leahy Scale: Poor Standing balance comment: Requires UE support in standing and external support for dynamic tasks/walking                           ADL either performed or assessed with clinical judgement   ADL Overall ADL's : Needs assistance/impaired                         Toilet Transfer: Minimal assistance;Moderate assistance;+2 for physical assistance;+2 for safety/equipment;Ambulation;Rolling walker (2 wheels) (simulated to recliner) Toilet Transfer Details (indicate cue type and reason): Mod A for initial sit<>stand. Min A +2 for power up and transition into upright posture.         Functional mobility during ADLs: Minimal assistance;Moderate assistance;+2 for physical assistance;+2 for safety/equipment;Rolling walker (2 wheels) General ADL Comments: Focused on balance, strengthening, and activity tolerance. Pt performing functional mobility x2 with Min-Mod A +2 and RW    Extremity/Trunk Assessment Upper Extremity Assessment Upper Extremity Assessment: LUE deficits/detail LUE Deficits / Details: Decreased strength compared to R LUE Coordination: decreased fine  motor;decreased gross motor   Lower Extremity Assessment Lower Extremity Assessment: Defer to PT evaluation LLE Deficits / Details: Grossly ~3/5 hip flexion, 4/5 knee extension LLE Sensation: WNL         Vision       Perception     Praxis      Cognition Arousal/Alertness: Awake/alert Behavior During Therapy: WFL for tasks assessed/performed Overall Cognitive Status: Impaired/Different from baseline Area of Impairment: Following commands, Problem solving                       Following Commands: Follows one step commands with increased time     Problem Solving: Requires verbal cues, Slow processing General Comments: Requiring repeated cues and increased time.        Exercises      Shoulder Instructions       General Comments Wife and son present    Pertinent Vitals/ Pain       Pain Assessment Pain Assessment: Faces Faces Pain Scale: Hurts even more Pain Location: R knee Pain Descriptors / Indicators: Discomfort Pain Intervention(s): Monitored during session, Limited activity within patient's tolerance, Repositioned  Home Living                                          Prior Functioning/Environment              Frequency  Min 2X/week        Progress Toward Goals  OT Goals(current goals can now be found in the care plan section)  Progress towards OT goals: Progressing toward goals  Acute Rehab OT Goals OT Goal Formulation: With patient/family Time For Goal Achievement: 01/09/22 Potential to Achieve Goals: Good ADL Goals Pt Will Perform Grooming: with min guard assist;standing Pt Will Perform Upper Body Dressing: with set-up;with supervision;sitting Pt Will Perform Lower Body Dressing: with min guard assist;sit to/from stand Pt Will Transfer to Toilet: with min guard assist;ambulating;bedside commode Pt Will Perform Toileting - Clothing Manipulation and hygiene: with min guard assist;sit to/from stand;sitting/lateral leans  Plan Discharge plan remains appropriate    Co-evaluation    PT/OT/SLP Co-Evaluation/Treatment: Yes Reason for Co-Treatment: For patient/therapist safety;To address functional/ADL transfers    OT goals addressed during session: ADL's and self-care      AM-PAC OT "6 Clicks" Daily Activity     Outcome Measure   Help from another person eating meals?: A Little Help from another person taking care of personal grooming?: A Little Help from another person toileting, which includes using toliet, bedpan, or urinal?: A Lot Help from another person bathing (including washing, rinsing, drying)?: A Lot Help from another person to put on and taking off regular upper body clothing?: A Little Help from another person to put on and taking off regular lower body clothing?: A Lot 6 Click Score: 15    End of Session Equipment Utilized During Treatment: Rolling walker (2 wheels);Gait belt  OT Visit Diagnosis: Unsteadiness on feet (R26.81);Other abnormalities of gait and mobility (R26.89);Muscle weakness (generalized) (M62.81)   Activity Tolerance Patient tolerated treatment well   Patient Left in chair;with call bell/phone within reach;with chair alarm set;with family/visitor present   Nurse Communication Mobility status;Patient requests pain meds        Time: 1521-1547 OT Time Calculation (min): 26 min  Charges: OT General Charges $OT Visit: 1 Visit OT Treatments $Therapeutic Activity: 8-22 mins  Burnis Halling  Jazminn Pomales MSOT, OTR/L Acute Rehab Office: [place] 12/29/2021, 4:52 PM

## 2021-12-29 NOTE — Progress Notes (Addendum)
Inpatient Rehab Admissions Coordinator: w  Awaiting determination from [facility] regarding CIR prior auth request.   Addendum: Did receive approval from [facility] for CIR admission; however, admissions to CIR on hold through the weekend.  Will follow up Monday for potential admission pending bed availability.    Shann Medal, PT, DPT Admissions Coordinator [phone] 12/29/21  11:50 AM

## 2021-12-30 ENCOUNTER — Encounter (HOSPITAL_COMMUNITY): Payer: Medicare HMO

## 2021-12-30 ENCOUNTER — Inpatient Hospital Stay (HOSPITAL_COMMUNITY)
Admission: RE | Admit: 2021-12-30 | Discharge: 2022-01-23 | DRG: 092 | Disposition: A | Discharge status: Home Health | Payer: Medicare HMO | Source: Intra-hospital | Attending: Physical Medicine and Rehabilitation | Admitting: Physical Medicine and Rehabilitation

## 2021-12-30 DIAGNOSIS — I6523 Occlusion and stenosis of bilateral carotid arteries: Secondary | ICD-10-CM | POA: Diagnosis not present

## 2021-12-30 DIAGNOSIS — Z905 Acquired absence of kidney: Secondary | ICD-10-CM | POA: Diagnosis not present

## 2021-12-30 DIAGNOSIS — E876 Hypokalemia: Secondary | ICD-10-CM | POA: Diagnosis not present

## 2021-12-30 DIAGNOSIS — Z8546 Personal history of malignant neoplasm of prostate: Secondary | ICD-10-CM | POA: Diagnosis not present

## 2021-12-30 DIAGNOSIS — S065XAA Traumatic subdural hemorrhage with loss of consciousness status unknown, initial encounter: Secondary | ICD-10-CM

## 2021-12-30 DIAGNOSIS — S065XAS Traumatic subdural hemorrhage with loss of consciousness status unknown, sequela: Secondary | ICD-10-CM | POA: Diagnosis not present

## 2021-12-30 DIAGNOSIS — Z85828 Personal history of other malignant neoplasm of skin: Secondary | ICD-10-CM

## 2021-12-30 DIAGNOSIS — S069X9A Unspecified intracranial injury with loss of consciousness of unspecified duration, initial encounter: Secondary | ICD-10-CM | POA: Diagnosis not present

## 2021-12-30 DIAGNOSIS — Z79899 Other long term (current) drug therapy: Secondary | ICD-10-CM

## 2021-12-30 DIAGNOSIS — F05 Delirium due to known physiological condition: Secondary | ICD-10-CM | POA: Diagnosis not present

## 2021-12-30 DIAGNOSIS — M7989 Other specified soft tissue disorders: Secondary | ICD-10-CM | POA: Diagnosis not present

## 2021-12-30 DIAGNOSIS — S069X9S Unspecified intracranial injury with loss of consciousness of unspecified duration, sequela: Secondary | ICD-10-CM | POA: Diagnosis not present

## 2021-12-30 DIAGNOSIS — R197 Diarrhea, unspecified: Secondary | ICD-10-CM | POA: Diagnosis not present

## 2021-12-30 DIAGNOSIS — Z87828 Personal history of other (healed) physical injury and trauma: Secondary | ICD-10-CM | POA: Diagnosis not present

## 2021-12-30 DIAGNOSIS — Z9852 Vasectomy status: Secondary | ICD-10-CM

## 2021-12-30 DIAGNOSIS — K5900 Constipation, unspecified: Secondary | ICD-10-CM | POA: Diagnosis not present

## 2021-12-30 DIAGNOSIS — N3941 Urge incontinence: Secondary | ICD-10-CM | POA: Diagnosis present

## 2021-12-30 DIAGNOSIS — G4700 Insomnia, unspecified: Secondary | ICD-10-CM | POA: Diagnosis not present

## 2021-12-30 DIAGNOSIS — R519 Headache, unspecified: Secondary | ICD-10-CM | POA: Diagnosis present

## 2021-12-30 DIAGNOSIS — R296 Repeated falls: Secondary | ICD-10-CM | POA: Diagnosis present

## 2021-12-30 DIAGNOSIS — Z87891 Personal history of nicotine dependence: Secondary | ICD-10-CM

## 2021-12-30 DIAGNOSIS — S069X9D Unspecified intracranial injury with loss of consciousness of unspecified duration, subsequent encounter: Secondary | ICD-10-CM | POA: Diagnosis not present

## 2021-12-30 DIAGNOSIS — R4189 Other symptoms and signs involving cognitive functions and awareness: Secondary | ICD-10-CM | POA: Diagnosis not present

## 2021-12-30 DIAGNOSIS — R451 Restlessness and agitation: Secondary | ICD-10-CM | POA: Diagnosis not present

## 2021-12-30 DIAGNOSIS — M170 Bilateral primary osteoarthritis of knee: Secondary | ICD-10-CM | POA: Diagnosis present

## 2021-12-30 DIAGNOSIS — N401 Enlarged prostate with lower urinary tract symptoms: Secondary | ICD-10-CM | POA: Diagnosis present

## 2021-12-30 DIAGNOSIS — I6200 Nontraumatic subdural hemorrhage, unspecified: Secondary | ICD-10-CM | POA: Diagnosis not present

## 2021-12-30 DIAGNOSIS — K30 Functional dyspepsia: Secondary | ICD-10-CM | POA: Diagnosis not present

## 2021-12-30 DIAGNOSIS — I10 Essential (primary) hypertension: Secondary | ICD-10-CM | POA: Diagnosis not present

## 2021-12-30 DIAGNOSIS — E785 Hyperlipidemia, unspecified: Secondary | ICD-10-CM | POA: Diagnosis present

## 2021-12-30 DIAGNOSIS — C61 Malignant neoplasm of prostate: Secondary | ICD-10-CM | POA: Diagnosis not present

## 2021-12-30 DIAGNOSIS — H9190 Unspecified hearing loss, unspecified ear: Secondary | ICD-10-CM | POA: Diagnosis present

## 2021-12-30 DIAGNOSIS — I672 Cerebral atherosclerosis: Secondary | ICD-10-CM | POA: Diagnosis not present

## 2021-12-30 DIAGNOSIS — Z298 Encounter for other specified prophylactic measures: Secondary | ICD-10-CM | POA: Diagnosis not present

## 2021-12-30 DIAGNOSIS — R410 Disorientation, unspecified: Secondary | ICD-10-CM | POA: Diagnosis not present

## 2021-12-30 DIAGNOSIS — Z9889 Other specified postprocedural states: Secondary | ICD-10-CM | POA: Diagnosis not present

## 2021-12-30 DIAGNOSIS — M25561 Pain in right knee: Secondary | ICD-10-CM | POA: Diagnosis not present

## 2021-12-30 MED ORDER — TAMSULOSIN HCL 0.4 MG PO CAPS
0.4000 mg | ORAL_CAPSULE | Freq: Two times a day (BID) | ORAL | Status: DC
  Administered 2021-12-30 – 2022-01-23 (×48): 0.4 mg via ORAL
  Filled 2021-12-30 (×49): qty 1

## 2021-12-30 MED ORDER — LEVETIRACETAM 500 MG PO TABS
500.0000 mg | ORAL_TABLET | Freq: Two times a day (BID) | ORAL | Status: AC
  Administered 2021-12-30 – 2022-01-02 (×7): 500 mg via ORAL
  Filled 2021-12-30 (×7): qty 1

## 2021-12-30 MED ORDER — LISINOPRIL 20 MG PO TABS
20.0000 mg | ORAL_TABLET | Freq: Every day | ORAL | Status: DC
  Administered 2021-12-31 – 2022-01-23 (×24): 20 mg via ORAL
  Filled 2021-12-30 (×24): qty 1

## 2021-12-30 MED ORDER — ACETAMINOPHEN 325 MG PO TABS
650.0000 mg | ORAL_TABLET | ORAL | Status: DC | PRN
Start: 2021-12-30 — End: 2022-01-02
  Administered 2021-12-30 – 2021-12-31 (×3): 650 mg via ORAL
  Filled 2021-12-30 (×3): qty 2

## 2021-12-30 MED ORDER — ONDANSETRON HCL 4 MG PO TABS
4.0000 mg | ORAL_TABLET | Freq: Four times a day (QID) | ORAL | Status: DC | PRN
Start: 2021-12-30 — End: 2022-01-09

## 2021-12-30 MED ORDER — HYDROCHLOROTHIAZIDE 25 MG PO TABS
25.0000 mg | ORAL_TABLET | Freq: Every day | ORAL | Status: DC
  Administered 2021-12-31 – 2022-01-23 (×24): 25 mg via ORAL
  Filled 2021-12-30 (×24): qty 1

## 2021-12-30 MED ORDER — FINASTERIDE 5 MG PO TABS
5.0000 mg | ORAL_TABLET | Freq: Every day | ORAL | Status: DC
  Administered 2021-12-31 – 2022-01-23 (×24): 5 mg via ORAL
  Filled 2021-12-30 (×24): qty 1

## 2021-12-30 MED ORDER — BISACODYL 10 MG RE SUPP
10.0000 mg | Freq: Every day | RECTAL | Status: DC | PRN
Start: 2021-12-30 — End: 2022-01-23
  Administered 2022-01-07 – 2022-01-09 (×2): 10 mg via RECTAL
  Filled 2021-12-30 (×2): qty 1

## 2021-12-30 MED ORDER — PANTOPRAZOLE SODIUM 40 MG PO TBEC
40.0000 mg | DELAYED_RELEASE_TABLET | Freq: Every day | ORAL | Status: DC
  Administered 2021-12-30 – 2022-01-22 (×24): 40 mg via ORAL
  Filled 2021-12-30 (×25): qty 1

## 2021-12-30 MED ORDER — TRAZODONE HCL 50 MG PO TABS: 25.0000 mg | ORAL_TABLET | Freq: Every evening | ORAL | Status: DC | PRN

## 2021-12-30 MED ORDER — HYDROCODONE-ACETAMINOPHEN 5-325 MG PO TABS
1.0000 | ORAL_TABLET | ORAL | Status: DC | PRN
  Administered 2021-12-30 – 2022-01-01 (×4): 1 via ORAL
  Filled 2021-12-30 (×5): qty 1

## 2021-12-30 MED ORDER — VITAMIN D 25 MCG (1000 UNIT) PO TABS
1000.0000 [IU] | ORAL_TABLET | Freq: Every day | ORAL | Status: DC
  Administered 2021-12-31 – 2022-01-23 (×24): 1000 [IU] via ORAL
  Filled 2021-12-30 (×24): qty 1

## 2021-12-30 MED ORDER — B COMPLEX-C PO TABS
1.0000 | ORAL_TABLET | Freq: Every day | ORAL | Status: DC
  Administered 2021-12-31 – 2022-01-23 (×24): 1 via ORAL
  Filled 2021-12-30 (×24): qty 1

## 2021-12-30 MED ORDER — PROMETHAZINE HCL 12.5 MG PO TABS: 12.5000 mg | ORAL_TABLET | ORAL | Status: DC | PRN

## 2021-12-30 MED ORDER — ONDANSETRON HCL 4 MG/2ML IJ SOLN: 4.0000 mg | Freq: Four times a day (QID) | INTRAMUSCULAR | Status: DC | PRN

## 2021-12-30 MED ORDER — LATANOPROST 0.005 % OP SOLN
1.0000 [drp] | Freq: Every day | OPHTHALMIC | Status: DC
  Administered 2021-12-30 – 2022-01-22 (×23): 1 [drp] via OPHTHALMIC
  Filled 2021-12-30 (×3): qty 2.5

## 2021-12-30 MED ORDER — LEVETIRACETAM 500 MG PO TABS: 500.0000 mg | ORAL_TABLET | Freq: Two times a day (BID) | ORAL | 1 refills | Status: DC

## 2021-12-30 MED ORDER — POLYETHYLENE GLYCOL 3350 17 G PO PACK
17.0000 g | PACK | Freq: Every day | ORAL | Status: DC
  Administered 2021-12-31 – 2022-01-14 (×14): 17 g via ORAL
  Filled 2021-12-30 (×15): qty 1

## 2021-12-30 MED ORDER — ATORVASTATIN CALCIUM 10 MG PO TABS
20.0000 mg | ORAL_TABLET | Freq: Every day | ORAL | Status: DC
  Administered 2021-12-31 – 2022-01-23 (×24): 20 mg via ORAL
  Filled 2021-12-30 (×25): qty 2

## 2021-12-30 MED ORDER — DICLOFENAC SODIUM 1 % EX GEL
2.0000 g | Freq: Three times a day (TID) | CUTANEOUS | Status: DC
  Administered 2021-12-30 – 2022-01-05 (×18): 2 g via TOPICAL
  Filled 2021-12-30: qty 100

## 2021-12-30 NOTE — Progress Notes (Signed)
Inpatient Rehabilitation Admission Medication Review by a Pharmacist  A complete drug regimen review was completed for this patient to identify any potential clinically significant medication issues.  High Risk Drug Classes Is patient taking? Indication by Medication  Antipsychotic No   Anticoagulant No   Antibiotic No   Opioid Yes Norco- acute pain  Antiplatelet No   Hypoglycemics/insulin No   Vasoactive Medication Yes Zestril, HCTZ- hypertension Flomax- BPH  Chemotherapy No   Other Yes Lipitor- HLD Keppra- seizure prophylaxis Protonix- GERD     Type of Medication Issue Identified Description of Issue Recommendation(s)  Drug Interaction(s) (clinically significant)     Duplicate Therapy     Allergy     No Medication Administration End Date     Incorrect Dose     Additional Drug Therapy Needed     Significant med changes from prior encounter (inform family/care partners about these prior to discharge).    Other       Clinically significant medication issues were identified that warrant physician communication and completion of prescribed/recommended actions by midnight of the next day:  No   Time spent performing this drug regimen review (minutes):  30   Momoka Stringfield BS, PharmD, BCPS Clinical Pharmacist 12/30/2021 1:21 PM  Contact: [phone] after 3 PM  "Be curious, not judgmental..." -Jamal Maes

## 2021-12-30 NOTE — H&P (Signed)
Physical Medicine and Rehabilitation Admission H&P    Chief Complaint  Patient presents with   Fall  : HPI: This is a 76 year old male with a past medical history of hypertension who presented to the emergency room on 12/24/2021 with frequent falls.  His wife states that he has been having more issues with his balance over the last several months.  This is potentially due to increasing knee pain.  He has been using a cane for 3 weeks prior to the fall that brought him into the hospital.  His wife believes he may have fallen and hit a table in his room.  There was brief loss of consciousness.  Patient does not recall events of the fall.  Upon admission imaging revealed large bilateral subacute subdural hematomas with mass effect.  On 12/25/2021 patient underwent bilateral bur hole evacuation with drain placement by Dr. Earnie Larsson.  Postoperatively patient has begun to progress.  He has been ambulating short distance with physical therapy but still struggling with balance and self-care tasks.  Patient's drains were removed on December 28, 2021.  He was evaluated by the inpatient rehab team and felt to be a candidate for inpatient rehab and ultimately was admitted today.  Review of Systems  Constitutional:  Negative for chills and fever.  HENT:  Positive for hearing loss. Negative for sinus pain.   Eyes:  Negative for blurred vision, double vision and photophobia.  Respiratory:  Negative for cough and hemoptysis.   Cardiovascular:  Positive for leg swelling. Negative for chest pain and palpitations.  Gastrointestinal:  Negative for abdominal pain, diarrhea, nausea and vomiting.  Genitourinary:  Negative for dysuria and urgency.  Musculoskeletal:  Positive for joint pain (R>L knees) and myalgias.  Skin:  Negative for rash.  Neurological:  Positive for dizziness and weakness. Negative for seizures.  Endo/Heme/Allergies:  Negative for environmental allergies and polydipsia.  Psychiatric/Behavioral:   Negative for depression and suicidal ideas.    Past Medical History:  Diagnosis Date   Allergy    Basal cell carcinoma    GERD (gastroesophageal reflux disease)    History of kidney cancer    benign; told not cancerous   Hypertension    Past Surgical History:  Procedure Laterality Date   CATARACT EXTRACTION Bilateral    CRANIOTOMY Bilateral 12/25/2021   Procedure: CRANIOTOMY HEMATOMA EVACUATION SUBDURAL;  Surgeon: Earnie Larsson, MD;  Location: [facility];  Service: Neurosurgery;  Laterality: Bilateral;   HERNIA REPAIR     x2   KIDNEY SURGERY Right    Had right kidney removed.    TONSILLECTOMY     VASECTOMY     Family History  Problem Relation Age of Onset   Cancer Mother        Breast    Cancer Father        Lung    Colon cancer Neg Hx    Social History:  reports that he has quit smoking. He has never used smokeless tobacco. He reports current alcohol use. He reports that he does not use drugs. Allergies: No Known Allergies Medications Prior to Admission  Medication Sig Dispense Refill   acetaminophen (TYLENOL) 500 MG tablet Take 1,000 mg by mouth every 6 (six) hours as needed for mild pain.     atorvastatin (LIPITOR) 20 MG tablet Take 1 tablet (20 mg total) by mouth daily. 90 tablet 3   b complex vitamins capsule Take 1 capsule by mouth daily.     cetirizine (ZYRTEC) 10 MG tablet  Take 10 mg by mouth daily.     cholecalciferol (VITAMIN D) 1000 units tablet Take 1,000 Units by mouth daily.     finasteride (PROSCAR) 5 MG tablet Take 5 mg by mouth daily.     latanoprost (XALATAN) 0.005 % ophthalmic solution Place 1 drop into both eyes at bedtime.     lisinopril-hydrochlorothiazide (ZESTORETIC) 20-25 MG tablet Take 1 tablet by mouth daily. 90 tablet 3   tamsulosin (FLOMAX) 0.4 MG CAPS capsule Take 1 capsule (0.4 mg total) by mouth daily. (Patient taking differently: Take 0.4 mg by mouth 2 (two) times daily.) 90 capsule 2   vitamin C (ASCORBIC ACID) 500 MG tablet Take 500 mg by mouth  daily.     FLUZONE HIGH-DOSE QUADRIVALENT 0.7 ML SUSY      SHINGRIX injection         Home: Home Living Family/patient expects to be discharged to:: Private residence Living Arrangements: Spouse/significant other Available Help at Discharge: Family, Available 24 hours/day Type of Home: House Home Access: Stairs to enter [employer] of Steps: 1 +1 Entrance Stairs-Rails: None Home Layout: One level Bathroom Shower/Tub: Door, Multimedia programmer: Handicapped height Home Equipment: [place] - single point, Industrial/product designer History: Prior Function Prior Level of Function : Independent/Modified Independent Mobility Comments: recently using canes. Started falling in last month ADLs Comments: Performs ADLs and simple IADLs.  Functional Status:  Mobility: Bed Mobility Overal bed mobility: Needs Assistance Bed Mobility: Supine to Sit Supine to sit: Min assist, HOB elevated General bed mobility comments: Min A for stabilizing trunk and to gain sitting balance, multiple posterior LOB until stabilized with feet on floor. max cues for forwards lean/correction Transfers Overall transfer level: Needs assistance Equipment used: Rolling walker (2 wheels) Transfers: Sit to/from Stand, Bed to chair/wheelchair/BSC Sit to Stand: Min assist, Mod assist Bed to/from chair/wheelchair/BSC transfer type:: Step pivot Step pivot transfers: Min assist General transfer comment: Mod A for initial sit<>stand. Min A +2 for remaining stands Ambulation/Gait Ambulation/Gait assistance: Min assist, Max assist, +2 safety/equipment Gait Distance (Feet): 15 Feet (+ 25 + 25) Assistive device: Rolling walker (2 wheels) Gait Pattern/deviations: Step-through pattern, Decreased stride length, Trunk flexed, Narrow base of support, Staggering left General Gait Details: pt drifting to L with tendency to get feet outside RW support and then had single episode of LOB requiring maxA of 1 and modA  of +2. benefits from max cues for increased BOS and step length Gait velocity: decreased    ADL: ADL Overall ADL's : Needs assistance/impaired Eating/Feeding: Set up, Sitting Grooming: Set up, Sitting Upper Body Bathing: Minimal assistance, Sitting Lower Body Bathing: Moderate assistance, Sit to/from stand Upper Body Dressing : Minimal assistance, Sitting Lower Body Dressing: Maximal assistance, Sit to/from stand Toilet Transfer: Minimal assistance, Moderate assistance, +2 for physical assistance, +2 for safety/equipment, Ambulation, Rolling walker (2 wheels) (simulated to recliner) Toilet Transfer Details (indicate cue type and reason): Mod A for initial sit<>stand. Min A +2 for power up and transition into upright posture. Functional mobility during ADLs: Minimal assistance, Moderate assistance, +2 for physical assistance, +2 for safety/equipment, Rolling walker (2 wheels) General ADL Comments: Focused on balance, strengthening, and activity tolerance. Pt performing functional mobility x2 with Min-Mod A +2 and RW  Cognition: Cognition Overall Cognitive Status: Impaired/Different from baseline Orientation Level: Oriented X4 Cognition Arousal/Alertness: Awake/alert Behavior During Therapy: WFL for tasks assessed/performed Overall Cognitive Status: Impaired/Different from baseline Area of Impairment: Following commands, Problem solving Following Commands: Follows one step commands with  increased time Problem Solving: Requires verbal cues, Slow processing General Comments: Requiring repeated cues and increased time. Difficult to assess due to: Hard of hearing/deaf  Physical Exam: Blood pressure 125/72, pulse 88, temperature 97.8 F (36.6 C), temperature source Oral, resp. rate 16, weight 107.8 kg, SpO2 94 %. Physical Exam Constitutional:      General: He is not in acute distress.    Appearance: He is not ill-appearing.     Comments: Extremely tall man lying in bed  HENT:      Head:     Comments: Bilateral craniotomy scars noted.     Right Ear: External ear normal.     Left Ear: External ear normal.     Nose: Nose normal.     Mouth/Throat:     Pharynx: Oropharynx is clear.  Eyes:     Extraocular Movements: Extraocular movements intact.     Conjunctiva/sclera: Conjunctivae normal.     Pupils: Pupils are equal, round, and reactive to light.  Cardiovascular:     Rate and Rhythm: Normal rate and regular rhythm.     Heart sounds: Murmur heard.     No gallop.  Pulmonary:     Effort: Pulmonary effort is normal. No respiratory distress.     Breath sounds: No wheezing.  Abdominal:     General: Bowel sounds are normal. There is no distension.     Tenderness: There is no abdominal tenderness.  Musculoskeletal:     Cervical back: Normal range of motion.     Comments: Crepitus in bilateral knees. Pt demonstrated joint line tenderness in left knee more than right (even though he claims right knee bothers him more). No obvious jt effusions.  Skin:    General: Skin is warm.     Coloration: Skin is not jaundiced.     Findings: Bruising present.     Comments: Crani scars as above with staples  Neurological:     Mental Status: He is alert.     Comments: Pt is alert. Oriented to place, person, month, year. HOH. Fair insight and awareness. STM deficits. No focal CN findings. UE grossly 3+ to 4+/5 prox to distal. LE: 3+ HF, 4KE and 4+ ADF/PF. No focal sensory findings. No abnl tone. No cerebellar abnl.   Psychiatric:     Comments: Pleasant and cooperative     No results found for this or any previous visit (from the past 48 hour(s)). No results found.    Blood pressure 125/72, pulse 88, temperature 97.8 F (36.6 C), temperature source Oral, resp. rate 16, weight 107.8 kg, SpO2 94 %.  Medical Problem List and Plan: 1. Functional deficits secondary to bilateral SDH's after fall s/p bilateral burr hole evacuation 8/14  -patient may shower  -ELOS/Goals: 12-14 days,  supervision to mod I goals with PT, OT, SLP 2.  Antithrombotics: -DVT/anticoagulation:  Mechanical:  Antiembolism stockings, knee (TED hose) Bilateral lower extremities Sequential compression devices, below knee Bilateral lower extremities  -antiplatelet therapy: n/a 3. Pain Management: tylenol for mild pain, hydrocodone for more severe pain  -consider topamax trial but headaches appear relatively mild at this point 4. Mood/Behavior/Sleep: trazodone prn for sleep  -pt seems fairly up beat and wife is very supportive  -antipsychotic agents: n/a 5. Neuropsych/cognition: This patient is not quite capable of making decisions on his own behalf. 6. Skin/Wound Care: local care to scalp, remove staples later next week 7. Fluids/Electrolytes/Nutrition: encourage appropriate po intake  -can be on regular diet  -check labs Monday  8.  Seizure prophylaxis: keppra '500mg'$  q 12 hours 9. Hx of Prostate Cancer  -flomax 0.'4mg'$  bid, proscar '5mg'$  daily  -monitor voiding patterns 10. HTN:   -hctz/lisinopril daily per home regimen  -monitor bp as he mobilizes more with therapy     Meredith Staggers, MD 12/30/2021

## 2021-12-30 NOTE — Discharge Summary (Signed)
  Physician Discharge Summary  Patient ID: Brandon [name] MRN: [id number] DOB/AGE: 14-Sep-1945 76 y.o.  Admit date: 12/24/2021 Discharge date: 12/30/2021  Admission Diagnoses:  Bilateral subacute subdural hematoma, posttraumatic  Discharge Diagnoses:  Same Principal Problem:   SDH (subdural hematoma) [facility])   Discharged Condition: Stable  Hospital Course:  Brandon [name] is a 76 y.o. male with frequent falls that was found to have large bilateral subacute subdural hematomas with mass effect.  He underwent bilateral bur hole evacuation by Dr. Annette Stable.  Tolerated the surgery well, postoperatively was monitored and continued to improve.  PT/OT evaluation recommended inpatient rehab in which he obtained a bed.  His incisions were healing appropriately.  He was making appropriate progression.  He was having normal bowel bladder function, ambulating and his pain was controlled on oral medication.  Treatments: Surgery -bilateral bur hole evacuation of subdural hematomas  Discharge Exam: Blood pressure (!) 159/69, pulse 75, temperature 98.6 F (37 C), temperature source Oral, resp. rate 16, weight 107.8 kg, SpO2 94 %. Awake, alert, oriented Speech fluent, appropriate CN grossly intact 5/5 BUE/BLE Wounds c/d/i  Disposition: Discharge disposition: 90-DC/txfr to inpt rehab facility with planned acute care hosp IP admission        Allergies as of 12/30/2021   No Known Allergies      Medication List     TAKE these medications    acetaminophen 500 MG tablet Commonly known as: TYLENOL Take 1,000 mg by mouth every 6 (six) hours as needed for mild pain.   ascorbic acid 500 MG tablet Commonly known as: VITAMIN C Take 500 mg by mouth daily.   atorvastatin 20 MG tablet Commonly known as: LIPITOR Take 1 tablet (20 mg total) by mouth daily.   b complex vitamins capsule Take 1 capsule by mouth daily.   cetirizine 10 MG tablet Commonly known as: ZYRTEC Take 10 mg by mouth  daily.   cholecalciferol 1000 units tablet Commonly known as: VITAMIN D Take 1,000 Units by mouth daily.   finasteride 5 MG tablet Commonly known as: PROSCAR Take 5 mg by mouth daily.   Fluzone High-Dose Quadrivalent 0.7 ML Susy Generic drug: Influenza Vac High-Dose Quad   latanoprost 0.005 % ophthalmic solution Commonly known as: XALATAN Place 1 drop into both eyes at bedtime.   levETIRAcetam 500 MG tablet Commonly known as: KEPPRA Take 1 tablet (500 mg total) by mouth every 12 (twelve) hours.   lisinopril-hydrochlorothiazide 20-25 MG tablet Commonly known as: ZESTORETIC Take 1 tablet by mouth daily.   Shingrix injection Generic drug: Zoster Vaccine Adjuvanted   tamsulosin 0.4 MG Caps capsule Commonly known as: FLOMAX Take 1 capsule (0.4 mg total) by mouth daily. What changed: when to take this        Follow-up Information     Kary Kos, MD Follow up.   Specialty: Neurosurgery Contact information: [address] [place] [address] [phone]                 Signed: Theodoro Doing Thedore Pickel 12/30/2021, 9:23 AM

## 2021-12-30 NOTE — PMR Pre-admission (Signed)
PMR Admission Coordinator Pre-Admission Assessment  Patient: Brandon [name] is an 75 y.o., male MRN: [id number] DOB: Sep 19, 1945 Height:   Weight: 107.8 kg  Insurance Information HMO:     PPO:      PCP:      IPA:      80/20:      OTHER:  PRIMARY: [employer]       Policy#: [id number]      Subscriber: Pt CM Name:       Phone#:      Fax#:  Pre-Cert#: [id number]      Pt. Approved 8/18 for 7 days.  Employer:  Benefits:  Phone #:      Name:  Eff. Date: 05/14/21     Deduct: n/a       Out of Pocket Max: $3,400 ($3,380 Remaining)       Life Max: n/a CIR: $295/day co-pay with a max co-pay of $1,770/admission (6 days)   SNF: $0/day co-pay for days 1-20, $196/day co-pay for days 21-100, limited to 100 days/cal yr   Outpatient: $20/ visit      Co-Pay:  Home Health: 100% covered     DME: 100% covered     Providers: in network   SECONDARY:       Policy#:      Phone#:   Development worker, community:       Phone#:   The Engineer, petroleum" for patients in Inpatient Rehabilitation Facilities with attached "Privacy Act [place] Records" was provided and verbally reviewed with: Family  Emergency Contact Information Contact Information     Name Relation Home Work Mobile   [place] H Spouse   [phone]   Leilani Able          Current Medical History  Patient Admitting Diagnosis: SDH History of Present Illness: Brandon [name] is a 77 y.o. male with PMH of HTN   who presented to the ED 12/24/21 due to frequent falls. . Imaging revealed  large bilateral subacute subdural hematomas with mass effect.  He underwent bilateral bur hole evacuation with drain placement  by Dr. Annette Stable on 12/25/21.  Tolerated the surgery well, postoperatively was monitored and continued to improve.Marland Kitchen  His incisions are  healing appropriately and Pt. Felt to be making appropriate progression, having normal bowel bladder function, ambulating,  with pain was controlled on oral medication. Drains removed  12/28/21. Pt. Seen by PT/OT/SLP with recommendations for CIR to assist return to PLOF     Patient's medical record from [facility] has been reviewed by the rehabilitation admission coordinator and physician.  Past Medical History  Past Medical History:  Diagnosis Date   Allergy    Basal cell carcinoma    GERD (gastroesophageal reflux disease)    History of kidney cancer    benign; told not cancerous   Hypertension     Has the patient had major surgery during 100 days prior to admission? Yes  Family History   family history includes Cancer in his father and mother.  Current Medications  Current Facility-Administered Medications:    acetaminophen (TYLENOL) tablet 650 mg, 650 mg, Oral, Q4H PRN, Earnie Larsson, MD, 650 mg at 12/30/21 0016   atorvastatin (LIPITOR) tablet 20 mg, 20 mg, Oral, Daily, Pool, Mallie Mussel, MD, 20 mg at 12/29/21 1038   B-complex with vitamin C tablet 1 tablet, 1 tablet, Oral, Daily, Pool, Mallie Mussel, MD, 1 tablet at 12/29/21 1038   bisacodyl (DULCOLAX) suppository 10 mg, 10 mg, Rectal, Daily PRN, Pool,  Mallie Mussel, MD   cholecalciferol (VITAMIN D3) 25 MCG (1000 UNIT) tablet 1,000 Units, 1,000 Units, Oral, Daily, Pool, Mallie Mussel, MD, 1,000 Units at 12/29/21 1039   finasteride (PROSCAR) tablet 5 mg, 5 mg, Oral, Daily, Pool, Mallie Mussel, MD, 5 mg at 12/29/21 1038   hydrochlorothiazide (HYDRODIURIL) tablet 25 mg, 25 mg, Oral, Daily, Pool, Mallie Mussel, MD, 25 mg at 12/29/21 1038   HYDROcodone-acetaminophen (NORCO/VICODIN) 5-325 MG per tablet 1 tablet, 1 tablet, Oral, Q4H PRN, Earnie Larsson, MD, 1 tablet at 12/28/21 1153   HYDROmorphone (DILAUDID) injection 0.5-1 mg, 0.5-1 mg, Intravenous, Q2H PRN, Earnie Larsson, MD   labetalol (NORMODYNE) injection 10-40 mg, 10-40 mg, Intravenous, Q10 min PRN, Pool, Mallie Mussel, MD   latanoprost (XALATAN) 0.005 % ophthalmic solution 1 drop, 1 drop, Both Eyes, QHS, Pool, Mallie Mussel, MD, 1 drop at 12/29/21 2106   levETIRAcetam (KEPPRA) tablet 500 mg, 500 mg, Oral,  Q12H, Kary Kos, MD, 500 mg at 12/29/21 2105   lisinopril (ZESTRIL) tablet 20 mg, 20 mg, Oral, Daily, Pool, Mallie Mussel, MD, 20 mg at 12/29/21 1039   ondansetron (ZOFRAN) tablet 4 mg, 4 mg, Oral, Q6H PRN **OR** ondansetron (ZOFRAN) injection 4 mg, 4 mg, Intravenous, Q6H PRN, Earnie Larsson, MD, 4 mg at 12/26/21 1517   Oral care mouth rinse, 15 mL, Mouth Rinse, PRN, Kary Kos, MD   pantoprazole (PROTONIX) EC tablet 40 mg, 40 mg, Oral, QHS, Ventura Sellers, RPH, 40 mg at 12/29/21 2105   polyethylene glycol (MIRALAX / GLYCOLAX) packet 17 g, 17 g, Oral, Daily, Pool, Mallie Mussel, MD, 17 g at 12/29/21 1242   promethazine (PHENERGAN) tablet 12.5-25 mg, 12.5-25 mg, Oral, Q4H PRN, Earnie Larsson, MD   tamsulosin [facility]) capsule 0.4 mg, 0.4 mg, Oral, BID, Earnie Larsson, MD, 0.4 mg at 12/29/21 2105  Patients Current Diet:  Diet Order             Diet Heart Room service appropriate? Yes; Fluid consistency: Thin  Diet effective now                   Precautions / Restrictions Precautions Precautions: Fall Precaution Comments: 2 JP drains in head Restrictions Weight Bearing Restrictions: No   Has the patient had 2 or more falls or a fall with injury in the past year? Yes  Prior Activity Level Limited Community (1-2x/wk): Went out a few x a week  Prior Functional Level Self Care: Did the patient need help bathing, dressing, using the toilet or eating? Independent  Indoor Mobility: Did the patient need assistance with walking from room to room (with or without device)? Independent  Stairs: Did the patient need assistance with internal or external stairs (with or without device)? Needed some help  Functional Cognition: Did the patient need help planning regular tasks such as shopping or remembering to take medications? Independent  Patient Information Are you of Hispanic, Latino/a,or Spanish origin?: A. No, not of Hispanic, Latino/a, or Spanish origin What is your race?: A. White Do you need or want an  interpreter to communicate with a doctor or health care staff?: 0. No  Patient's Response To:  Health Literacy and Transportation Is the patient able to respond to health literacy and transportation needs?: No Health Literacy - How often do you need to have someone help you when you read instructions, pamphlets, or other written material from your doctor or pharmacy?: Never In the past 12 months, has lack of transportation kept you from medical appointments or from getting medications?: No In the past 12 months, has  lack of transportation kept you from meetings, work, or from getting things needed for daily living?: No  Development worker, international aid / [place]: Cane - single point, Shower seat  Prior Device Use: Indicate devices/aids used by the patient prior to current illness, exacerbation or injury? Walker  Current Functional Level Cognition  Overall Cognitive Status: Impaired/Different from baseline Difficult to assess due to: Hard of hearing/deaf Orientation Level: Oriented X4 Following Commands: Follows one step commands with increased time General Comments: Requiring repeated cues and increased time.    Extremity Assessment (includes Sensation/Coordination)  Upper Extremity Assessment: LUE deficits/detail LUE Deficits / Details: Decreased strength compared to R LUE Coordination: decreased fine motor, decreased gross motor  Lower Extremity Assessment: Defer to PT evaluation LLE Deficits / Details: Grossly ~3/5 hip flexion, 4/5 knee extension LLE Sensation: WNL    ADLs  Overall ADL's : Needs assistance/impaired Eating/Feeding: Set up, Sitting Grooming: Set up, Sitting Upper Body Bathing: Minimal assistance, Sitting Lower Body Bathing: Moderate assistance, Sit to/from stand Upper Body Dressing : Minimal assistance, Sitting Lower Body Dressing: Maximal assistance, Sit to/from stand Toilet Transfer: Minimal assistance, Moderate assistance, +2 for physical assistance,  +2 for safety/equipment, Ambulation, Rolling walker (2 wheels) (simulated to recliner) Toilet Transfer Details (indicate cue type and reason): Mod A for initial sit<>stand. Min A +2 for power up and transition into upright posture. Functional mobility during ADLs: Minimal assistance, Moderate assistance, +2 for physical assistance, +2 for safety/equipment, Rolling walker (2 wheels) General ADL Comments: Focused on balance, strengthening, and activity tolerance. Pt performing functional mobility x2 with Min-Mod A +2 and RW    Mobility  Overal bed mobility: Needs Assistance Bed Mobility: Supine to Sit Supine to sit: Min assist, HOB elevated General bed mobility comments: Min A for stabilizing trunk and to gain sitting balance, multiple posterior LOB until stabilized with feet on floor. max cues for forwards lean/correction    Transfers  Overall transfer level: Needs assistance Equipment used: Rolling walker (2 wheels) Transfers: Sit to/from Stand, Bed to chair/wheelchair/BSC Sit to Stand: Min assist, Mod assist Bed to/from chair/wheelchair/BSC transfer type:: Step pivot Step pivot transfers: Min assist General transfer comment: Mod A for initial sit<>stand. Min A +2 for remaining stands    Ambulation / Gait / Stairs / Emergency planning/management officer  Ambulation/Gait Ambulation/Gait assistance: Min assist, Max assist, +2 safety/equipment Gait Distance (Feet): 15 Feet (+ 25 + 25) Assistive device: Rolling walker (2 wheels) Gait Pattern/deviations: Step-through pattern, Decreased stride length, Trunk flexed, Narrow base of support, Staggering left General Gait Details: pt drifting to L with tendency to get feet outside RW support and then had single episode of LOB requiring maxA of 1 and modA of +2. benefits from max cues for increased BOS and step length Gait velocity: decreased    Posture / Balance Dynamic Sitting Balance Sitting balance - Comments: iniital posterior lean. Min A for gaining  balance Balance Overall balance assessment: Needs assistance, History of Falls Sitting-balance support: Feet supported, No upper extremity supported Sitting balance-Leahy Scale: Fair Sitting balance - Comments: iniital posterior lean. Min A for gaining balance Standing balance support: During functional activity, Reliant on assistive device for balance Standing balance-Leahy Scale: Poor Standing balance comment: Requires UE support in standing and external support for dynamic tasks/walking    Special needs/care consideration Skin surgical incisions    Previous Home Environment (from acute therapy documentation) Living Arrangements: Spouse/significant other Available Help at Discharge: Family, Available 24 hours/day Type of Home: House Home Layout: One level Home  Access: Stairs to enter Entrance Stairs-Rails: None Entrance Stairs-Number of Steps: 1 +1 Bathroom Shower/Tub: Door, Multimedia programmer: Handicapped height  Discharge Living Setting Plans for Discharge Living Setting: Patient's home Type of Home at Discharge: House Discharge Home Layout: One level Discharge Home Access: Stairs to enter Entrance Stairs-Rails: None Entrance Stairs-Number of Steps: 1+1 Discharge Bathroom Shower/Tub: Door, Walk-in shower Discharge Bathroom Toilet: Handicapped height Discharge Bathroom Accessibility: No Does the patient have any problems obtaining your medications?: No  Social/Family/Support Systems Patient Roles: Spouse Contact Information: Loy Mccartt Anticipated Caregiver: [phone] Ability/Limitations of Caregiver: Min A Caregiver Availability: 24/7 Discharge Plan Discussed with Primary Caregiver: No Is Caregiver In Agreement with Plan?: No Does Caregiver/Family have Issues with Lodging/Transportation while Pt is in Rehab?: Yes  Goals Patient/Family Goal for Rehab: PT/OT/SLP, supervision to mod I goals Expected length of stay: 12-14 days Cultural Considerations:  n/a Pt/Family Agrees to Admission and willing to participate: Yes Program Orientation Provided & Reviewed with Pt/Caregiver Including Roles  & Responsibilities: Yes  Decrease burden of Care through IP rehab admission: n/a  Possible need for SNF placement upon discharge: not anticipated  Patient Condition: I have reviewed medical records from [facility] , spoken with CM, and patient and family member. I met with patient at the bedside for inpatient rehabilitation assessment.  Patient will benefit from ongoing PT, OT, and SLP, can actively participate in 3 hours of therapy a day 5 days of the week, and can make measurable gains during the admission.  Patient will also benefit from the coordinated team approach during an Inpatient Acute Rehabilitation admission.  The patient will receive intensive therapy as well as Rehabilitation physician, nursing, social worker, and care management interventions.  Due to safety, skin/wound care, disease management, medication administration, and pain management,  the patient requires 24 hour a day rehabilitation nursing.  The patient is currently min-mod A  with mobility and basic ADLs.  Discharge setting and therapy post discharge at home with home health is anticipated.  Patient has agreed to participate in the Acute Inpatient Rehabilitation Program and will admit today.  Preadmission Screen Completed By:  Genella Mech, 12/30/2021 9:05 AM ______________________________________________________________________   Discussed status with Dr. Naaman Plummer  on 12/30/21 at 68 and received approval for admission today.  Admission Coordinator:  Genella Mech, CCC-SLP, time 100/Date 12/30/21   Assessment/Plan: Diagnosis: SDH Does the need for close, 24 hr/day Medical supervision in concert with the patient's rehab needs make it unreasonable for this patient to be served in a less intensive setting? Yes Co-Morbidities requiring supervision/potential  complications: HTN, GERD Due to bladder management, bowel management, safety, skin/wound care, disease management, medication administration, pain management, and patient education, does the patient require 24 hr/day rehab nursing? Yes Does the patient require coordinated care of a physician, rehab nurse, PT, OT, and SLP to address physical and functional deficits in the context of the above medical diagnosis(es)? Yes Addressing deficits in the following areas: balance, endurance, locomotion, strength, transferring, bowel/bladder control, bathing, dressing, feeding, grooming, toileting, cognition, speech, swallowing, and psychosocial support Can the patient actively participate in an intensive therapy program of at least 3 hrs of therapy 5 days a week? Yes The potential for patient to make measurable gains while on inpatient rehab is excellent Anticipated functional outcomes upon discharge from inpatient rehab: supervision PT, supervision OT, modified independent and supervision SLP Estimated rehab length of stay to reach the above functional goals is: 12-14 days Anticipated discharge destination: Home 10. Overall  Rehab/Functional Prognosis: excellent   MD Signature: Meredith Staggers, MD, [place] Director Rehabilitation Services 12/30/2021

## 2021-12-30 NOTE — Progress Notes (Signed)
   Providing Compassionate, Quality Care - Together  NEUROSURGERY PROGRESS NOTE   S: No issues overnight. HA overnight which resolved with acetaminophen  O: EXAM:  BP (!) 159/69 (BP Location: Left Arm)   Pulse 75   Temp 98.6 F (37 C) (Oral)   Resp 16   Wt 107.8 kg   SpO2 94%   BMI 27.12 kg/m   Awake, alert, oriented  PERRL Speech fluent, appropriate  CNs grossly intact  5/5 BUE/BLE  Incisions c/d/i  ASSESSMENT:  76 y.o. male with   SDH S/p BH drainage  PLAN: - rehab pending    Thank you for allowing me to participate in this patient's care.  Please do not hesitate to call with questions or concerns.   Elwin Sleight, [facility] Cell: [phone]

## 2021-12-30 NOTE — H&P (Addendum)
Physical Medicine and Rehabilitation Admission H&P        Chief Complaint  Patient presents with   Fall  : HPI: This is a 76 year old male with a past medical history of hypertension who presented to the emergency room on 12/24/2021 with frequent falls.  His wife states that he has been having more issues with his balance over the last several months.  This is potentially due to increasing knee pain.  He has been using a cane for 3 weeks prior to the fall that brought him into the hospital.  His wife believes he may have fallen and hit a table in his room.  There was brief loss of consciousness.  Patient does not recall events of the fall.  Upon admission imaging revealed large bilateral subacute subdural hematomas with mass effect.  On 12/25/2021 patient underwent bilateral bur hole evacuation with drain placement by Dr. Earnie Larsson.  Postoperatively patient has begun to progress.  He has been ambulating short distance with physical therapy but still struggling with balance and self-care tasks.  Patient's drains were removed on December 28, 2021.  He was evaluated by the inpatient rehab team and felt to be a candidate for inpatient rehab and ultimately was admitted today.   Review of Systems  Constitutional:  Negative for chills and fever.  HENT:  Positive for hearing loss. Negative for sinus pain.   Eyes:  Negative for blurred vision, double vision and photophobia.  Respiratory:  Negative for cough and hemoptysis.   Cardiovascular:  Positive for leg swelling. Negative for chest pain and palpitations.  Gastrointestinal:  Negative for abdominal pain, diarrhea, nausea and vomiting.  Genitourinary:  Negative for dysuria and urgency.  Musculoskeletal:  Positive for joint pain (R>L knees) and myalgias.  Skin:  Negative for rash.  Neurological:  Positive for dizziness and weakness. Negative for seizures.  Endo/Heme/Allergies:  Negative for environmental allergies and polydipsia.   Psychiatric/Behavioral:  Negative for depression and suicidal ideas.         Past Medical History:  Diagnosis Date   Allergy     Basal cell carcinoma     GERD (gastroesophageal reflux disease)     History of kidney cancer      benign; told not cancerous   Hypertension           Past Surgical History:  Procedure Laterality Date   CATARACT EXTRACTION Bilateral     CRANIOTOMY Bilateral 12/25/2021    Procedure: CRANIOTOMY HEMATOMA EVACUATION SUBDURAL;  Surgeon: Earnie Larsson, MD;  Location: [facility];  Service: Neurosurgery;  Laterality: Bilateral;   HERNIA REPAIR        x2   KIDNEY SURGERY Right      Had right kidney removed.    TONSILLECTOMY       VASECTOMY             Family History  Problem Relation Age of Onset   Cancer Mother          Breast    Cancer Father          Lung    Colon cancer Neg Hx      Social History:  reports that he has quit smoking. He has never used smokeless tobacco. He reports current alcohol use. He reports that he does not use drugs. Allergies: No Known Allergies       Medications Prior to Admission  Medication Sig Dispense Refill   acetaminophen (TYLENOL) 500 MG tablet Take 1,000 mg  by mouth every 6 (six) hours as needed for mild pain.       atorvastatin (LIPITOR) 20 MG tablet Take 1 tablet (20 mg total) by mouth daily. 90 tablet 3   b complex vitamins capsule Take 1 capsule by mouth daily.       cetirizine (ZYRTEC) 10 MG tablet Take 10 mg by mouth daily.       cholecalciferol (VITAMIN D) 1000 units tablet Take 1,000 Units by mouth daily.       finasteride (PROSCAR) 5 MG tablet Take 5 mg by mouth daily.       latanoprost (XALATAN) 0.005 % ophthalmic solution Place 1 drop into both eyes at bedtime.       lisinopril-hydrochlorothiazide (ZESTORETIC) 20-25 MG tablet Take 1 tablet by mouth daily. 90 tablet 3   tamsulosin (FLOMAX) 0.4 MG CAPS capsule Take 1 capsule (0.4 mg total) by mouth daily. (Patient taking differently: Take 0.4 mg by mouth 2 (two)  times daily.) 90 capsule 2   vitamin C (ASCORBIC ACID) 500 MG tablet Take 500 mg by mouth daily.       FLUZONE HIGH-DOSE QUADRIVALENT 0.7 ML SUSY         SHINGRIX injection                Home: Home Living Family/patient expects to be discharged to:: Private residence Living Arrangements: Spouse/significant other Available Help at Discharge: Family, Available 24 hours/day Type of Home: House Home Access: Stairs to enter [employer] of Steps: 1 +1 Entrance Stairs-Rails: None Home Layout: One level Bathroom Shower/Tub: Door, Multimedia programmer: Handicapped height Home Equipment: Fowler - single point, Industrial/product designer History: Prior Function Prior Level of Function : Independent/Modified Independent Mobility Comments: recently using canes. Started falling in last month ADLs Comments: Performs ADLs and simple IADLs.   Functional Status:  Mobility: Bed Mobility Overal bed mobility: Needs Assistance Bed Mobility: Supine to Sit Supine to sit: Min assist, HOB elevated General bed mobility comments: Min A for stabilizing trunk and to gain sitting balance, multiple posterior LOB until stabilized with feet on floor. max cues for forwards lean/correction Transfers Overall transfer level: Needs assistance Equipment used: Rolling walker (2 wheels) Transfers: Sit to/from Stand, Bed to chair/wheelchair/BSC Sit to Stand: Min assist, Mod assist Bed to/from chair/wheelchair/BSC transfer type:: Step pivot Step pivot transfers: Min assist General transfer comment: Mod A for initial sit<>stand. Min A +2 for remaining stands Ambulation/Gait Ambulation/Gait assistance: Min assist, Max assist, +2 safety/equipment Gait Distance (Feet): 15 Feet (+ 25 + 25) Assistive device: Rolling walker (2 wheels) Gait Pattern/deviations: Step-through pattern, Decreased stride length, Trunk flexed, Narrow base of support, Staggering left General Gait Details: pt drifting to L with  tendency to get feet outside RW support and then had single episode of LOB requiring maxA of 1 and modA of +2. benefits from max cues for increased BOS and step length Gait velocity: decreased   ADL: ADL Overall ADL's : Needs assistance/impaired Eating/Feeding: Set up, Sitting Grooming: Set up, Sitting Upper Body Bathing: Minimal assistance, Sitting Lower Body Bathing: Moderate assistance, Sit to/from stand Upper Body Dressing : Minimal assistance, Sitting Lower Body Dressing: Maximal assistance, Sit to/from stand Toilet Transfer: Minimal assistance, Moderate assistance, +2 for physical assistance, +2 for safety/equipment, Ambulation, Rolling walker (2 wheels) (simulated to recliner) Toilet Transfer Details (indicate cue type and reason): Mod A for initial sit<>stand. Min A +2 for power up and transition into upright posture. Functional mobility during ADLs: Minimal assistance,  Moderate assistance, +2 for physical assistance, +2 for safety/equipment, Rolling walker (2 wheels) General ADL Comments: Focused on balance, strengthening, and activity tolerance. Pt performing functional mobility x2 with Min-Mod A +2 and RW   Cognition: Cognition Overall Cognitive Status: Impaired/Different from baseline Orientation Level: Oriented X4 Cognition Arousal/Alertness: Awake/alert Behavior During Therapy: WFL for tasks assessed/performed Overall Cognitive Status: Impaired/Different from baseline Area of Impairment: Following commands, Problem solving Following Commands: Follows one step commands with increased time Problem Solving: Requires verbal cues, Slow processing General Comments: Requiring repeated cues and increased time. Difficult to assess due to: Hard of hearing/deaf   Physical Exam: Blood pressure 125/72, pulse 88, temperature 97.8 F (36.6 C), temperature source Oral, resp. rate 16, weight 107.8 kg, SpO2 94 %. Physical Exam Constitutional:      General: He is not in acute  distress.    Appearance: He is not ill-appearing.     Comments: Extremely tall man lying in bed  HENT:     Head:     Comments: Bilateral craniotomy scars noted.     Right Ear: External ear normal.     Left Ear: External ear normal.     Nose: Nose normal.     Mouth/Throat:     Pharynx: Oropharynx is clear.  Eyes:     Extraocular Movements: Extraocular movements intact.     Conjunctiva/sclera: Conjunctivae normal.     Pupils: Pupils are equal, round, and reactive to light.  Cardiovascular:     Rate and Rhythm: Normal rate and regular rhythm.     Heart sounds: Murmur heard.     No gallop.  Pulmonary:     Effort: Pulmonary effort is normal. No respiratory distress.     Breath sounds: No wheezing.  Abdominal:     General: Bowel sounds are normal. There is no distension.     Tenderness: There is no abdominal tenderness.  Musculoskeletal:     Cervical back: Normal range of motion.     Comments: Crepitus in bilateral knees. Pt demonstrated joint line tenderness in left knee more than right (even though he claims right knee bothers him more). No obvious jt effusions.  Skin:    General: Skin is warm.     Coloration: Skin is not jaundiced.     Findings: Bruising present.     Comments: Crani scars as above with staples  Neurological:     Mental Status: He is alert.     Comments: Pt is alert. Oriented to place, person, month, year. HOH. Fair insight and awareness. STM deficits. No focal CN findings. UE grossly 3+ to 4+/5 prox to distal. LE: 3+ HF, 4KE and 4+ ADF/PF. No focal sensory findings. No abnl tone. No cerebellar abnl.   Psychiatric:     Comments: Pleasant and cooperative        Lab Results Last 48 Hours  No results found for this or any previous visit (from the past 49 hour(s)).   Imaging Results (Last 48 hours)  No results found.         Blood pressure 125/72, pulse 88, temperature 97.8 F (36.6 C), temperature source Oral, resp. rate 16, weight 107.8 kg, SpO2 94 %.    Medical Problem List and Plan: 1. Functional deficits secondary to bilateral SDH's after fall s/p bilateral burr hole evacuation 8/14             -patient may shower             -ELOS/Goals: 12-14 days, supervision to  mod I goals with PT, OT, SLP 2.  Antithrombotics: -DVT/anticoagulation:  Mechanical:  Antiembolism stockings, knee (TED hose) Bilateral lower extremities Sequential compression devices, below knee Bilateral lower extremities             -antiplatelet therapy: n/a 3. Pain Management: tylenol for mild pain, hydrocodone for more severe pain             -consider topamax trial but headaches appear relatively mild at this point 4. Mood/Behavior/Sleep: trazodone prn for sleep             -pt seems fairly up beat and wife is very supportive             -antipsychotic agents: n/a 5. Neuropsych/cognition: This patient is not quite capable of making decisions on his own behalf. 6. Skin/Wound Care: local care to scalp, remove staples later next week 7. Fluids/Electrolytes/Nutrition: encourage appropriate po intake             -can be on regular diet             -check labs Monday  8. Seizure prophylaxis: keppra '500mg'$  q 12 hours 9. Hx of Prostate Cancer             -flomax 0.'4mg'$  bid, proscar '5mg'$  daily             -monitor voiding patterns 10. HTN:              -hctz/lisinopril daily per home regimen             -monitor bp as he mobilizes more with therapy 11. Osteoarthritis bilateral knees, R>L  -voltaren gel to both knees  -observe knees with PT, might benefit from some type of bracing  -his knee pain certainly was contributing to his recent falls and balance deficits      I have personally performed a face to face diagnostic evaluation of this patient and formulated the key components of the plan.  Additionally, I have personally reviewed laboratory data, imaging studies, as well as relevant notes.   The patient's status has not changed from the original H&P.  Any changes in  documentation from the acute care chart have been noted above.  Meredith Staggers, MD, [facility]

## 2021-12-30 NOTE — Progress Notes (Addendum)
VASCULAR LAB    Attempted bilateral lower extremity venous.  However, patient having skin assessment.  Will re-attempt as schedule permits.   Jhane Lorio, RVT 12/30/2021, 5:37 PM

## 2021-12-30 NOTE — Progress Notes (Signed)
Inpatient Rehab Admissions Coordinator:  ?  ?Pt. To admit to CIR today. RN may call report to [phone] ? ?Yulieth Carrender, MS, CCC-SLP ?Rehab Admissions Coordinator  ?[phone] (celll) ?[phone] (office) ? ?

## 2021-12-31 ENCOUNTER — Inpatient Hospital Stay (HOSPITAL_COMMUNITY): Payer: Medicare HMO

## 2021-12-31 ENCOUNTER — Encounter (HOSPITAL_COMMUNITY): Payer: Self-pay | Admitting: Physical Medicine and Rehabilitation

## 2021-12-31 ENCOUNTER — Other Ambulatory Visit: Payer: Self-pay

## 2021-12-31 DIAGNOSIS — C61 Malignant neoplasm of prostate: Secondary | ICD-10-CM | POA: Diagnosis not present

## 2021-12-31 DIAGNOSIS — M170 Bilateral primary osteoarthritis of knee: Secondary | ICD-10-CM | POA: Diagnosis not present

## 2021-12-31 DIAGNOSIS — S069X9A Unspecified intracranial injury with loss of consciousness of unspecified duration, initial encounter: Secondary | ICD-10-CM | POA: Diagnosis not present

## 2021-12-31 DIAGNOSIS — I10 Essential (primary) hypertension: Secondary | ICD-10-CM | POA: Diagnosis not present

## 2021-12-31 DIAGNOSIS — M7989 Other specified soft tissue disorders: Secondary | ICD-10-CM | POA: Diagnosis not present

## 2021-12-31 NOTE — Progress Notes (Signed)
PMR Admission Coordinator Pre-Admission Assessment   Patient: Brandon [name] is an 76 y.o., male MRN: [id number] DOB: 09-Dec-1945 Height:   Weight: 107.8 kg   Insurance Information HMO:     PPO:      PCP:      IPA:      80/20:      OTHER:  PRIMARY: [employer]       Policy#: [id number]      Subscriber: Pt CM Name: Hilbert Odor       Phone#: [phone]   Fax#: [phone] Pre-Cert#: [id number]      Pt. Approved 8/18 until 8/24 days.  Employer:  Benefits:  Phone #:      Name:  Eff. Date: 05/14/21     Deduct: n/a       Out of Pocket Max: $3,400 ($3,380 Remaining)       Life Max: n/a CIR: $295/day co-pay with a max co-pay of $1,770/admission (6 days)   SNF: $0/day co-pay for days 1-20, $196/day co-pay for days 21-100, limited to 100 days/cal yr   Outpatient: $10-20/ visit       Home Health: 100% covered     DME: 80/20% covered     Providers: in network   SECONDARY:       Policy#:      Phone#:    Development worker, community:       Phone#:    The Engineer, petroleum" for patients in Inpatient Rehabilitation Facilities with attached "Privacy Act [place] Records" was provided and verbally reviewed with: Family   Emergency Contact Information Contact Information       Name Relation Home Work Mobile    Durand H Spouse     [phone]    Leilani Able                 Current Medical History  Patient Admitting Diagnosis: SDH History of Present Illness: Brandon [name] is a 76 y.o. male with PMH of HTN   who presented to the ED 12/24/21 due to frequent falls. . Imaging revealed  large bilateral subacute subdural hematomas with mass effect.  He underwent bilateral bur hole evacuation with drain placement  by Dr. Annette Stable on 12/25/21.  Tolerated the surgery well, postoperatively was monitored and continued to improve.Marland Kitchen  His incisions are  healing appropriately and Pt. Felt to be making appropriate progression, having normal bowel bladder function, ambulating,  with  pain was controlled on oral medication. Drains removed 12/28/21. Pt. Seen by PT/OT/SLP with recommendations for CIR to assist return to PLOF    Patient's medical record from [facility] has been reviewed by the rehabilitation admission coordinator and physician.   Past Medical History      Past Medical History:  Diagnosis Date   Allergy     Basal cell carcinoma     GERD (gastroesophageal reflux disease)     History of kidney cancer      benign; told not cancerous   Hypertension        Has the patient had major surgery during 100 days prior to admission? Yes   Family History   family history includes Cancer in his father and mother.   Current Medications   Current Facility-Administered Medications:    acetaminophen (TYLENOL) tablet 650 mg, 650 mg, Oral, Q4H PRN, Earnie Larsson, MD, 650 mg at 12/30/21 0016   atorvastatin (LIPITOR) tablet 20 mg, 20 mg, Oral, Daily, Pool, Mallie Mussel, MD, 20 mg at 12/29/21 1038  B-complex with vitamin C tablet 1 tablet, 1 tablet, Oral, Daily, Pool, Mallie Mussel, MD, 1 tablet at 12/29/21 1038   bisacodyl (DULCOLAX) suppository 10 mg, 10 mg, Rectal, Daily PRN, Earnie Larsson, MD   cholecalciferol (VITAMIN D3) 25 MCG (1000 UNIT) tablet 1,000 Units, 1,000 Units, Oral, Daily, Pool, Mallie Mussel, MD, 1,000 Units at 12/29/21 1039   finasteride (PROSCAR) tablet 5 mg, 5 mg, Oral, Daily, Pool, Mallie Mussel, MD, 5 mg at 12/29/21 1038   hydrochlorothiazide (HYDRODIURIL) tablet 25 mg, 25 mg, Oral, Daily, Pool, Mallie Mussel, MD, 25 mg at 12/29/21 1038   HYDROcodone-acetaminophen (NORCO/VICODIN) 5-325 MG per tablet 1 tablet, 1 tablet, Oral, Q4H PRN, Earnie Larsson, MD, 1 tablet at 12/28/21 1153   HYDROmorphone (DILAUDID) injection 0.5-1 mg, 0.5-1 mg, Intravenous, Q2H PRN, Earnie Larsson, MD   labetalol (NORMODYNE) injection 10-40 mg, 10-40 mg, Intravenous, Q10 min PRN, Pool, Mallie Mussel, MD   latanoprost (XALATAN) 0.005 % ophthalmic solution 1 drop, 1 drop, Both Eyes, QHS, Pool, Mallie Mussel, MD, 1 drop at  12/29/21 2106   levETIRAcetam (KEPPRA) tablet 500 mg, 500 mg, Oral, Q12H, Kary Kos, MD, 500 mg at 12/29/21 2105   lisinopril (ZESTRIL) tablet 20 mg, 20 mg, Oral, Daily, Pool, Mallie Mussel, MD, 20 mg at 12/29/21 1039   ondansetron (ZOFRAN) tablet 4 mg, 4 mg, Oral, Q6H PRN **OR** ondansetron (ZOFRAN) injection 4 mg, 4 mg, Intravenous, Q6H PRN, Earnie Larsson, MD, 4 mg at 12/26/21 1517   Oral care mouth rinse, 15 mL, Mouth Rinse, PRN, Kary Kos, MD   pantoprazole (PROTONIX) EC tablet 40 mg, 40 mg, Oral, QHS, Ventura Sellers, RPH, 40 mg at 12/29/21 2105   polyethylene glycol (MIRALAX / GLYCOLAX) packet 17 g, 17 g, Oral, Daily, Pool, Mallie Mussel, MD, 17 g at 12/29/21 1242   promethazine (PHENERGAN) tablet 12.5-25 mg, 12.5-25 mg, Oral, Q4H PRN, Earnie Larsson, MD   tamsulosin [facility]) capsule 0.4 mg, 0.4 mg, Oral, BID, Earnie Larsson, MD, 0.4 mg at 12/29/21 2105   Patients Current Diet:  Diet Order                  Diet Heart Room service appropriate? Yes; Fluid consistency: Thin  Diet effective now                         Precautions / Restrictions Precautions Precautions: Fall Precaution Comments: 2 JP drains in head Restrictions Weight Bearing Restrictions: No    Has the patient had 2 or more falls or a fall with injury in the past year? Yes   Prior Activity Level Limited Community (1-2x/wk): Went out a few x a week   Prior Functional Level Self Care: Did the patient need help bathing, dressing, using the toilet or eating? Independent   Indoor Mobility: Did the patient need assistance with walking from room to room (with or without device)? Independent   Stairs: Did the patient need assistance with internal or external stairs (with or without device)? Needed some help   Functional Cognition: Did the patient need help planning regular tasks such as shopping or remembering to take medications? Independent   Patient Information  (information obtained via proxy) Are you of Hispanic, Latino/a,or  Spanish origin?: A. No, not of Hispanic, Latino/a, or Spanish origin What is your race?: A. White Do you need or want an interpreter to communicate with a doctor or health care staff?: 0. No   Patient's Response To:   (information obtained via proxy) Health Literacy and Transportation Is the patient able  to respond to health literacy and transportation needs?: No Pt unable to answer   Health Literacy - How often do you need to have someone help you when you read instructions, pamphlets, or other written material from your doctor or pharmacy?: Pt. Unable to answer  In the past 12 months, has lack of transportation kept you from medical appointments or from getting medications?: No In the past 12 months, has lack of transportation kept you from meetings, work, or from getting things needed for daily living?: No   Home Assistive Devices / [place]: Cane - single point, Shower seat   Prior Device Use: Indicate devices/aids used by the patient prior to current illness, exacerbation or injury? Walker   Current Functional Level Cognition   Overall Cognitive Status: Impaired/Different from baseline Difficult to assess due to: Hard of hearing/deaf Orientation Level: Oriented X4 Following Commands: Follows one step commands with increased time General Comments: Requiring repeated cues and increased time.    Extremity Assessment (includes Sensation/Coordination)   Upper Extremity Assessment: LUE deficits/detail LUE Deficits / Details: Decreased strength compared to R LUE Coordination: decreased fine motor, decreased gross motor  Lower Extremity Assessment: Defer to PT evaluation LLE Deficits / Details: Grossly ~3/5 hip flexion, 4/5 knee extension LLE Sensation: WNL     ADLs   Overall ADL's : Needs assistance/impaired Eating/Feeding: Set up, Sitting Grooming: Set up, Sitting Upper Body Bathing: Minimal assistance, Sitting Lower Body Bathing: Moderate assistance, Sit to/from  stand Upper Body Dressing : Minimal assistance, Sitting Lower Body Dressing: Maximal assistance, Sit to/from stand Toilet Transfer: Minimal assistance, Moderate assistance, +2 for physical assistance, +2 for safety/equipment, Ambulation, Rolling walker (2 wheels) (simulated to recliner) Toilet Transfer Details (indicate cue type and reason): Mod A for initial sit<>stand. Min A +2 for power up and transition into upright posture. Functional mobility during ADLs: Minimal assistance, Moderate assistance, +2 for physical assistance, +2 for safety/equipment, Rolling walker (2 wheels) General ADL Comments: Focused on balance, strengthening, and activity tolerance. Pt performing functional mobility x2 with Min-Mod A +2 and RW     Mobility   Overal bed mobility: Needs Assistance Bed Mobility: Supine to Sit Supine to sit: Min assist, HOB elevated General bed mobility comments: Min A for stabilizing trunk and to gain sitting balance, multiple posterior LOB until stabilized with feet on floor. max cues for forwards lean/correction     Transfers   Overall transfer level: Needs assistance Equipment used: Rolling walker (2 wheels) Transfers: Sit to/from Stand, Bed to chair/wheelchair/BSC Sit to Stand: Min assist, Mod assist Bed to/from chair/wheelchair/BSC transfer type:: Step pivot Step pivot transfers: Min assist General transfer comment: Mod A for initial sit<>stand. Min A +2 for remaining stands     Ambulation / Gait / Stairs / Emergency planning/management officer   Ambulation/Gait Ambulation/Gait assistance: Min assist, Max assist, +2 safety/equipment Gait Distance (Feet): 15 Feet (+ 25 + 25) Assistive device: Rolling walker (2 wheels) Gait Pattern/deviations: Step-through pattern, Decreased stride length, Trunk flexed, Narrow base of support, Staggering left General Gait Details: pt drifting to L with tendency to get feet outside RW support and then had single episode of LOB requiring maxA of 1 and modA of +2.  benefits from max cues for increased BOS and step length Gait velocity: decreased     Posture / Balance Dynamic Sitting Balance Sitting balance - Comments: iniital posterior lean. Min A for gaining balance Balance Overall balance assessment: Needs assistance, History of Falls Sitting-balance support: Feet supported, No upper extremity supported  Sitting balance-Leahy Scale: Fair Sitting balance - Comments: iniital posterior lean. Min A for gaining balance Standing balance support: During functional activity, Reliant on assistive device for balance Standing balance-Leahy Scale: Poor Standing balance comment: Requires UE support in standing and external support for dynamic tasks/walking     Special needs/care consideration Skin surgical incisions     Previous Home Environment (from acute therapy documentation) Living Arrangements: Spouse/significant other Available Help at Discharge: Family, Available 24 hours/day Type of Home: House Home Layout: One level Home Access: Stairs to enter Entrance Stairs-Rails: None Entrance Stairs-Number of Steps: 1 +1 Bathroom Shower/Tub: Door, Multimedia programmer: Handicapped height   Discharge Living Setting Plans for Discharge Living Setting: Patient's home Type of Home at Discharge: House Discharge Home Layout: One level Discharge Home Access: Stairs to enter Entrance Stairs-Rails: None Entrance Stairs-Number of Steps: 1+1 Discharge Bathroom Shower/Tub: Door, Walk-in shower Discharge Bathroom Toilet: Handicapped height Discharge Bathroom Accessibility: No Does the patient have any problems obtaining your medications?: No   Social/Family/Support Systems Patient Roles: Spouse Contact Information: [place] Anticipated Caregiver: [phone] Ability/Limitations of Caregiver: Min A Caregiver Availability: 24/7 Discharge Plan Discussed with Primary Caregiver: No Is Caregiver In Agreement with Plan?: No Does Caregiver/Family  have Issues with Lodging/Transportation while Pt is in Rehab?: Yes   Goals Patient/Family Goal for Rehab: PT/OT/SLP, supervision to mod I goals Expected length of stay: 12-14 days Cultural Considerations: n/a Pt/Family Agrees to Admission and willing to participate: Yes Program Orientation Provided & Reviewed with Pt/Caregiver Including Roles  & Responsibilities: Yes   Decrease burden of Care through IP rehab admission: n/a   Possible need for SNF placement upon discharge: not anticipated   Patient Condition: I have reviewed medical records from [facility] , spoken with CM, and patient and family member. I met with patient at the bedside for inpatient rehabilitation assessment.  Patient will benefit from ongoing PT, OT, and SLP, can actively participate in 3 hours of therapy a day 5 days of the week, and can make measurable gains during the admission.  Patient will also benefit from the coordinated team approach during an Inpatient Acute Rehabilitation admission.  The patient will receive intensive therapy as well as Rehabilitation physician, nursing, social worker, and care management interventions.  Due to safety, skin/wound care, disease management, medication administration, and pain management,  the patient requires 24 hour a day rehabilitation nursing.  The patient is currently min-mod A  with mobility and basic ADLs.  Discharge setting and therapy post discharge at home with home health is anticipated.  Patient has agreed to participate in the Acute Inpatient Rehabilitation Program and will admit today.   Preadmission Screen Completed By:  Genella Mech, 12/30/2021 9:05 AM ______________________________________________________________________   Discussed status with Dr. Naaman Plummer  on 12/30/21 at 50 and received approval for admission today.   Admission Coordinator:  Genella Mech, CCC-SLP, time 100/Date 12/30/21    Assessment/Plan: Diagnosis: SDH Does the need for close,  24 hr/day Medical supervision in concert with the patient's rehab needs make it unreasonable for this patient to be served in a less intensive setting? Yes Co-Morbidities requiring supervision/potential complications: HTN, GERD Due to bladder management, bowel management, safety, skin/wound care, disease management, medication administration, pain management, and patient education, does the patient require 24 hr/day rehab nursing? Yes Does the patient require coordinated care of a physician, rehab nurse, PT, OT, and SLP to address physical and functional deficits in the context of the above medical diagnosis(es)? Yes  Addressing deficits in the following areas: balance, endurance, locomotion, strength, transferring, bowel/bladder control, bathing, dressing, feeding, grooming, toileting, cognition, speech, swallowing, and psychosocial support Can the patient actively participate in an intensive therapy program of at least 3 hrs of therapy 5 days a week? Yes The potential for patient to make measurable gains while on inpatient rehab is excellent Anticipated functional outcomes upon discharge from inpatient rehab: supervision PT, supervision OT, modified independent and supervision SLP Estimated rehab length of stay to reach the above functional goals is: 12-14 days Anticipated discharge destination: Home 10. Overall Rehab/Functional Prognosis: excellent     MD Signature: Meredith Staggers, MD, [place] Director Rehabilitation Services 12/30/2021

## 2021-12-31 NOTE — Evaluation (Signed)
Occupational Therapy Assessment and Plan  Patient Details  Name: Brandon [name] MRN: [id number] Date of Birth: 12-16-1945  OT Diagnosis: acute pain, ataxia, cognitive deficits, and muscle weakness (generalized) Rehab Potential: Rehab Potential (ACUTE ONLY): Excellent ELOS: 2-3 weeks   Today's Date: 12/31/2021 OT Individual Time: 5456-2563 OT Individual Time Calculation (min): 60 min     Hospital Problem: Principal Problem:   Traumatic brain injury with loss of consciousness ([place])   Past Medical History:  Past Medical History:  Diagnosis Date   Allergy    Basal cell carcinoma    GERD (gastroesophageal reflux disease)    History of kidney cancer    benign; told not cancerous   Hypertension    Past Surgical History:  Past Surgical History:  Procedure Laterality Date   CATARACT EXTRACTION Bilateral    CRANIOTOMY Bilateral 12/25/2021   Procedure: CRANIOTOMY HEMATOMA EVACUATION SUBDURAL;  Surgeon: Earnie Larsson, MD;  Location: [place];  Service: Neurosurgery;  Laterality: Bilateral;   HERNIA REPAIR     x2   KIDNEY SURGERY Right    Had right kidney removed.    TONSILLECTOMY     VASECTOMY      Assessment & Plan Clinical Impression: Patient is a 76 y.o. year old male with a past medical history of hypertension who presented to the emergency room on 12/24/2021 with frequent falls.  His wife states that he has been having more issues with his balance over the last several months.  This is potentially due to increasing knee pain.  He has been using a cane for 3 weeks prior to the fall that brought him into the hospital.  His wife believes he may have fallen and hit a table in his room.  There was brief loss of consciousness.  Patient does not recall events of the fall.  Upon admission imaging revealed large bilateral subacute subdural hematomas with mass effect.  On 12/25/2021 patient underwent bilateral bur hole evacuation with drain placement by Dr. Earnie Larsson.  Postoperatively patient has  begun to progress.  He has been ambulating short distance with physical therapy but still struggling with balance and self-care tasks.  Patient's drains were removed on December 28, 2021.  Patient transferred to CIR on 12/30/2021 .    Patient currently requires total with basic self-care skills secondary to muscle weakness, impaired timing and sequencing, unbalanced muscle activation, motor apraxia, decreased coordination, and decreased motor planning, decreased body awareness, decreased awareness, decreased safety awareness, and decreased memory,  , and decreased sitting balance, decreased standing balance, decreased postural control, and decreased balance strategies.  Prior to hospitalization, patient could complete BADL and IADL with modified independent .  Patient will benefit from skilled intervention to decrease level of assist with basic self-care skills prior to discharge home with care partner.  Anticipate patient will require intermittent supervision and minimal physical assistance and follow up home health.  OT - End of Session Activity Tolerance: Decreased this session Endurance Deficit: Yes OT Assessment Rehab Potential (ACUTE ONLY): Excellent OT Patient demonstrates impairments in the following area(s): Balance;Motor;Endurance;Perception;Safety;Cognition OT Basic ADL's Functional Problem(s): Eating;Grooming;Bathing;Dressing;Toileting OT Transfers Functional Problem(s): Toilet;Tub/Shower OT Plan OT Intensity: Minimum of 1-2 x/day, 45 to 90 minutes OT Frequency: 5 out of 7 days OT Duration/Estimated Length of Stay: 2-3 weeks OT Treatment/Interventions: Balance/vestibular training;Neuromuscular re-education;Self Care/advanced ADL retraining;Therapeutic Exercise;Wheelchair propulsion/positioning;UE/LE Strength taining/ROM;DME/adaptive equipment instruction;Cognitive remediation/compensation;Community reintegration;Discharge planning;Functional mobility training;Therapeutic Activities;UE/LE  Coordination activities;Patient/family education OT Basic Self-Care Anticipated Outcome(s): SBA-Min A OT Toileting Anticipated Outcome(s): Min A OT Bathroom  Transfers Anticipated Outcome(s): Min A OT Recommendation Patient destination: Home Follow Up Recommendations: Home health OT Equipment Recommended: 3 in 1 bedside comode;Tub/shower seat;To be determined   OT Evaluation Precautions/Restrictions  Precautions Precautions: Fall Restrictions Weight Bearing Restrictions: No Pain Pain Assessment Pain Scale: 0-10 Pain Score: 3  Pain Type: Surgical pain Pain Location: Head Pain Descriptors / Indicators: Aching Pain Intervention(s): RN made aware Home Living/Prior [place] expects to be discharged to:: Private residence Living Arrangements: Spouse/significant other Available Help at Discharge: Family, Available 24 hours/day Type of Home: House Home Access: Stairs to enter [employer] of Steps: 1 mini step from sidewalk to porch then 1 mini step up into house Entrance Stairs-Rails: None Home Layout: One level Bathroom Shower/Tub: Door, Multimedia programmer: Handicapped height Bathroom Accessibility: Yes Additional Comments: single point cane, shower stool  Lives With: Spouse IADL History Current License: Yes Mode of Transportation: Car Prior Function Level of Independence: Independent with gait, Independent with basic ADLs, Independent with transfers  Able to Take Stairs?: Yes Driving: Yes Vocation: Retired Surveyor, mining Baseline Vision/History: 1 Wears glasses (reading) Ability to See in Adequate Light:  (Not tested) Patient Visual Report:  (Unknown) Vision Assessment?:  (Not tested due to time constraint. To be tested at a later time) Perception  Perception: Impaired Spatial Orientation: Impaired body awareness when seated and standing. Unable to hold correct standing or sitting posture without physical  assistance. Praxis Praxis: Impaired Praxis Impairment Details: Motor planning;Initiation Praxis-Other Comments: Needs further assessment Cognition Cognition Overall Cognitive Status: Impaired/Different from baseline Arousal/Alertness: Lethargic Orientation Level: Person Memory: Impaired Memory Impairment: Retrieval deficit;Decreased short term memory Awareness: Impaired Problem Solving: Impaired Safety/Judgment: Impaired Brief Interview for Mental Status (BIMS) Repetition of Three Words (First Attempt): None Temporal Orientation: Year: Correct Temporal Orientation: Month: Accurate within 5 days Temporal Orientation: Day: Incorrect Recall: "Sock": No, could not recall Recall: "Blue": No, could not recall Recall: "Bed": No, could not recall BIMS Summary Score: 5 Sensation Sensation Light Touch: Appears Intact Hot/Cold: Not tested Proprioception: Impaired Detail Proprioception Impaired Details:  (generalized proprioception/body awarenss) Stereognosis: Not tested Coordination Gross Motor Movements are Fluid and Coordinated: No Fine Motor Movements are Fluid and Coordinated: No Coordination and Movement Description: discoordination noted during functional activities Finger Nose Finger Test: NT Heel Shin Test: MFL 9 Hole Peg Test: NT Motor  Motor Motor: Abnormal postural alignment and control  Trunk/Postural Assessment  Cervical Assessment Cervical Assessment:  (forward head) Thoracic Assessment Thoracic Assessment:  (rounded shoulders) Lumbar Assessment Lumbar Assessment:  (anterior pelvic tilt) Postural Control Postural Control: Deficits on evaluation Righting Reactions: list to right, flexed knees. Postural Limitations: flexed knees when attempting to stand.  Balance Balance Balance Assessed: Yes Dynamic Sitting Balance Sitting balance - Comments: Required max assist to maintain static sitting balance. Very severe sitting balance. Relient on arm rests or recliner  to maintain. Static Standing Balance Static Standing - Balance Support: Bilateral upper extremity supported Static Standing - Level of Assistance: 1: +2 Total assist Extremity/Trunk Assessment RUE Assessment RUE Assessment: Exceptions to [facility] Active Range of Motion (AROM) Comments: WFL in all ranges. Shoulder, elbows, wrist General Strength Comments: Generalized weakness noted in RUE in shoulder, wrist, elbow all ranges. Functtional gross grasp. LUE Assessment LUE Assessment: Exceptions to [facility] Active Range of Motion (AROM) Comments: WFL in all ranges. Shoulder, wrist, elbow in all ranges. General Strength Comments: Generalized weakness noted in LUE shoulder elbow wrist. Functional gross grasp.  Care Tool   12/31/21 1048  CareTool - Eating  Eating Assist Level Minimal Assistance - Patient > 75%  CareTool - Oral Care  Oral Care Assist Level Minimal Assistance - Patient > 75%  CareTool - Bathing  Body parts bathed by patient Face;Front perineal area  Body parts bathed by helper Right arm;Left arm;Chest;Abdomen;Buttocks;Right upper leg;Left upper leg;Right lower leg;Left lower leg  Assist Level Maximal Assistance - Patient 24 - 49%  CareTool- Upper Body Dressing (including orthotics  What is the patient wearing? Pull over shirt  Assist Level Maximal Assistance - Patient 25 - 49%  CareTool - Lower Body Dressing (excluding footwear)  What is the patient wearing? Pants;Incontinence brief  Assist for lower body dressing 2 Helpers  CareTool - Putting on/Taking off footwear  What is the patient wearing? Socks  Assist for footwear Dependent - Patient 0%  Human resources officer for toileting 2 Helpers  CareTool - Sit to stand transfer  Sit to stand assist level 2 Helpers  CareTool - Chair/bed transfer  Chair/bed transfer assist level Dependent - Research scientist (life sciences) - Toilet Transfers  Assist Level 2 Helpers  CareTool - Hearing  Ability to hear (with hearing aid or hearing  appliances if normally used) 2. Moderate difficulty - speaker has to increase volume and speak distinctly  CareTool - Expression  Expression of Ideas and Wants 1. Rarely/Never expressess or very difficult - rarely/never expresses self or speech is very difficult to understand  CareTool - Comprehension  Understanding Verbal and Non-Verbal Content 2. Sometimes understands - understands only basic conversations or simple, direct phrases. Frequently requires cues to understand  CareTool - Memory  Memory/Recall Ability  None of the above were recalled  CareTool - Signs and Symptoms of Delirium (from CAM)  Is there evidence of an acute change in mental status from the patient's baseline? 0 No  Inattention 0 Behavior not present  Disorganized thinking 0 Behavior not present  Altered level of consciousness  0 Behavior not present  Positive CAM assessment intervention/preventative measures Universal precautions (preventative) measures initiated  Refer to Care Plan for Long Term Goals  SHORT TERM GOAL WEEK 1 OT Short Term Goal 1 (Week 1): Pt will demonstrate improved sitting balance and activity tolerance while completing self care tasks seated on EOB with Min A to maintain balance OT Short Term Goal 2 (Week 1): Pt will complete sit to stand with RW with Mod Assist x1. OT Short Term Goal 3 (Week 1): Pt will increase UB dressing to Min Assist OT Short Term Goal 4 (Week 1): Pt will increase toilet transfer to Mod A x1 using RW  Recommendations for other services: Therapeutic Recreation  Pet therapy   Skilled Therapeutic Intervention  Patient in recliner upon therapy arrival with Wife present. Pt demonstrating signs of being cold while recently provided with warm blanket from nursing. Wife assisted with communication during session due to pt's hearing difficulty. Pt demonstrates decreased activity tolerance, motor control, strength, coordination, cognition, ADL skills, standing tolerance, core strength  requiring 1-2 person physical assist to complete BADL tasks. See below for ADL performance. OT provided education on POC, therapy schedule, therapy goals, and safety awareness. Wife verbalized understanding as patient was unable due to fatigue level.  ADL ADL Eating: Minimal assistance;Moderate cueing Where Assessed-Eating: Chair Grooming: Maximal cueing;Minimal assistance Where Assessed-Grooming: Chair Upper Body Bathing: Maximal assistance Where Assessed-Upper Body Bathing: Chair Lower Body Bathing: Dependent Where Assessed-Lower Body Bathing: Chair Upper Body Dressing: Maximal assistance Where Assessed-Upper Body Dressing: Chair Lower Body Dressing: Dependent (2 person) Where Assessed-Lower  Body Dressing: Chair Toileting: Dependent Where Assessed-Toileting: Bedside Commode Toilet Transfer: Maximal assistance Toilet Transfer Method: Other (comment) (stedy) Toilet Transfer Equipment: Radiographer, therapeutic: Not assessed Social research officer, government: Not assessed Mobility  Bed Mobility Bed Mobility: Not assessed (Pt sitting in recliner upon therapy arrival) Supine to Sit: Moderate Assistance - Patient 50-74% Transfers Sit to Stand: Dependent - mechanical lift;2 Helpers (Stedy) Stand to Sit: Dependent - mechanical lift;2 Helpers (Stedy)   Discharge Criteria: Patient will be discharged from OT if patient refuses treatment 3 consecutive times without medical reason, if treatment goals not met, if there is a change in medical status, if patient makes no progress towards goals or if patient is discharged from hospital.  The above assessment, treatment plan, treatment alternatives and goals were discussed and mutually agreed upon: by patient  Ailene Ravel, OTR/L,CBIS  Supplemental OT - [place] and [facility]  12/31/2021, 3:42 PM

## 2021-12-31 NOTE — Evaluation (Signed)
Speech Language Pathology Assessment and Plan  Patient Details  Name: Brandon [name] MRN: [id number] Date of Birth: 26-Mar-1946  SLP Diagnosis: Dysarthria;Cognitive Impairments  Rehab Potential: Good ELOS: 14-21 days    Today's Date: 12/31/2021 SLP Individual Time: 68-1520 SLP Individual Time Calculation (min): 38 min   Hospital Problem: Principal Problem:   Traumatic brain injury with loss of consciousness ([place])  Past Medical History:  Past Medical History:  Diagnosis Date   Allergy    Basal cell carcinoma    GERD (gastroesophageal reflux disease)    History of kidney cancer    benign; told not cancerous   Hypertension    Past Surgical History:  Past Surgical History:  Procedure Laterality Date   CATARACT EXTRACTION Bilateral    CRANIOTOMY Bilateral 12/25/2021   Procedure: CRANIOTOMY HEMATOMA EVACUATION SUBDURAL;  Surgeon: Earnie Larsson, MD;  Location: [place];  Service: Neurosurgery;  Laterality: Bilateral;   HERNIA REPAIR     x2   KIDNEY SURGERY Right    Had right kidney removed.    TONSILLECTOMY     VASECTOMY      Assessment / Plan / Recommendation Clinical Impression  This is a 76 year old male with a past medical history of hypertension who presented to the emergency room on 12/24/2021 with frequent falls.  His wife states that he has been having more issues with his balance over the last several months.  This is potentially due to increasing knee pain.  He has been using a cane for 3 weeks prior to the fall that brought him into the hospital.  His wife believes he may have fallen and hit a table in his room.  There was brief loss of consciousness.  Patient does not recall events of the fall.  Upon admission imaging revealed large bilateral subacute subdural hematomas with mass effect.  On 12/25/2021 patient underwent bilateral bur hole evacuation with drain placement by Dr. Earnie Larsson.  Postoperatively patient has begun to progress.  He has been ambulating short distance  with physical therapy but still struggling with balance and self-care tasks.  Patient's drains were removed on December 28, 2021.  He was evaluated by the inpatient rehab team and felt to be a candidate for inpatient rehab and ultimately was admitted today.  SLP evaluation was completed on 8/20 with results as follows:   At bedside, pt presents with what appears to be grossly intact swallowing function.  He exhibited no overt s/s of aspiration with solids or liquids.  His oral phase was timely and efficient for containing and clearing boluses from the oral cavity without leaving significant residuals post swallow.  Pt would benefit from having full supervision during meals due to cognitive-linguistic deficits.  Recommend that pt remain on his currently prescribed diet with no further ST needs for dysphagia indicated at this time.    Pt also presents with moderately severe cognitive deficits.  Pt had difficulty following 2 step directions in the context of a functional task and also exhibited significantly increased response latency.  Pt also had very low vocal intensity and imprecise articulation which impacted intelligibility at the sentence level.  Pt was also noted to have decreased sustained attention to tasks which likely impacted the abovementioned deficits and influenced all other higher level cognitive processes.  Pt's wife reports that pt's performance on today's evaluation represented a noticeable decline in function from yesterday.  Relayed information to nursing staff  who concurred that pt did appear altered today in comparison to yesterday's admission to  CIR.  LPN reported that she would investigate medication list to see if changes could be med related.    Given the abovementioned deficits, pt would benefit from skilled ST while inpatient in order to maximize functional independence and reduce burden of care prior to discharge.  Anticipate that pt will need 24/7 supervision at discharge in  addition to [place] follow up at next level of care.      Skilled Therapeutic Interventions          Cognitive-linguistic and bedside swallow evaluation completed with results and recommendations reviewed with patient and family.    SLP Assessment  Patient will need skilled Speech Lanaguage Pathology Services during CIR admission    Recommendations  SLP Diet Recommendations: Age appropriate regular solids;Thin Liquid Administration via: Cup;Straw Medication Administration: Whole meds with liquid Supervision: Patient able to self feed;Full supervision/cueing for compensatory strategies Compensations: Minimize environmental distractions;Slow rate;Small sips/bites Postural Changes and/or Swallow Maneuvers: Seated upright 90 degrees;Upright 30-60 min after meal Oral Care Recommendations: Oral care BID Patient destination: Home Follow up Recommendations: Home Health SLP Equipment Recommended: None recommended by SLP    SLP Frequency 3 to 5 out of 7 days   SLP Duration  SLP Intensity  SLP Treatment/Interventions 14-21 days  Minumum of 1-2 x/day, 30 to 90 minutes  Cognitive remediation/compensation;Cueing hierarchy;Speech/Language facilitation;Functional tasks;Internal/external aids;Patient/family education;Dysphagia/aspiration precaution training    Pain Pain Assessment Pain Scale: 0-10 Pain Score: 4  Pain Type: Acute pain Pain Location: Head Pain Descriptors / Indicators: Headache Pain Intervention(s): RN made aware  Prior Functioning Cognitive/Linguistic Baseline: Within functional limits Type of Home: House  Lives With: Spouse Available Help at Discharge: Family;Available 24 hours/day Vocation: Retired  Programmer, systems Overall Cognitive Status: Impaired/Different from baseline Arousal/Alertness: Lethargic Orientation Level: Oriented to person;Oriented to place;Disoriented to time;Oriented to situation Memory: Impaired Memory Impairment: Storage deficit;Retrieval  deficit Awareness: Impaired Awareness Impairment: Emergent impairment Problem Solving: Impaired Problem Solving Impairment: Verbal basic;Functional basic Safety/Judgment: Impaired  Comprehension Auditory Comprehension Overall Auditory Comprehension: Impaired Yes/No Questions: Within Functional Limits Commands: Impaired Two Step Basic Commands: 50-74% accurate Interfering Components: Hearing;Attention;Processing speed EffectiveTechniques: Repetition;Stressing words;Visual/Gestural cues Expression Expression Primary Mode of Expression: Verbal Verbal Expression Overall Verbal Expression: Impaired Initiation: Impaired Other Verbal Expression Comments: minimal communication noted from patient, significantly increased response latency Oral Motor Oral Motor/Sensory Function Overall Oral Motor/Sensory Function: Other (comment) (difficult to assess given pt's difficulty following commands) Motor Speech Overall Motor Speech: Impaired Respiration: Within functional limits Phonation: Low vocal intensity Articulation: Impaired Level of Impairment: Sentence Intelligibility: Intelligibility reduced Sentence: 50-74% accurate  Care Tool Care Tool Cognition Ability to hear (with hearing aid or hearing appliances if normally used Ability to hear (with hearing aid or hearing appliances if normally used): 2. Moderate difficulty - speaker has to increase volume and speak distinctly   Expression of Ideas and Wants Expression of Ideas and Wants: 1. Rarely/Never expressess or very difficult - rarely/never expresses self or speech is very difficult to understand   Understanding Verbal and Non-Verbal Content Understanding Verbal and Non-Verbal Content: 2. Sometimes understands - understands only basic conversations or simple, direct phrases. Frequently requires cues to understand  Memory/Recall Ability Memory/Recall Ability : None of the above were recalled   PMSV Assessment  PMSV  Trial Intelligibility: Intelligibility reduced Sentence: 50-74% accurate  Bedside Swallowing Assessment General Previous Swallow Assessment: none on record Diet Prior to this Study: Regular;Thin liquids Temperature Spikes Noted: No Respiratory Status: Room air History of Recent Intubation: Yes Length of Intubations (days):  (  for procedure) Behavior/Cognition: Alert;Confused;Requires cueing Oral Cavity - Dentition: Adequate natural dentition Self-Feeding Abilities: Able to feed self Patient Positioning: Upright in bed Baseline Vocal Quality: Low vocal intensity Volitional Cough: Cognitively unable to elicit Volitional Swallow: Unable to elicit  Oral Care Assessment   Ice Chips   Thin Liquid Thin Liquid: Within functional limits Nectar Thick   Honey Thick   Puree Puree: Within functional limits Solid Solid: Within functional limits BSE Assessment Risk for Aspiration Impact on safety and function: Mild aspiration risk Other Related Risk Factors: Cognitive impairment;Lethargy  Short Term Goals: Week 1: SLP Short Term Goal 1 (Week 1): Pt will sustain his attention to basic, familiar tasks for 3-5 minute intervals wtih mod cues for redirection. SLP Short Term Goal 2 (Week 1): Pt will utilize external aids to recall daily information with mod cues. SLP Short Term Goal 3 (Week 1): Pt will initiate functional communication with mod assist. SLP Short Term Goal 4 (Week 1): Pt will utilize an increased vocal intensity to achieve intelligibility at the sentence level with mod assist.  Refer to Care Plan for Long Term Goals  Recommendations for other services: None   Discharge Criteria: Patient will be discharged from SLP if patient refuses treatment 3 consecutive times without medical reason, if treatment goals not met, if there is a change in medical status, if patient makes no progress towards goals or if patient is discharged from hospital.  The above assessment, treatment  plan, treatment alternatives and goals were discussed and mutually agreed upon: by patient and by family  Emilio Math 12/31/2021, 4:56 PM

## 2021-12-31 NOTE — Plan of Care (Signed)
  Problem: RH Bathing Goal: LTG Patient will bathe all body parts with assist levels (OT) Description: LTG: Patient will bathe all body parts with assist levels (OT) Flowsheets (Taken 12/31/2021 1559) LTG: Pt will perform bathing with assistance level/cueing: Minimal Assistance - Patient > 75% LTG: Position pt will perform bathing: Shower   Problem: RH Dressing Goal: LTG Patient will perform upper body dressing (OT) Description: LTG Patient will perform upper body dressing with assist, with/without cues (OT). Flowsheets (Taken 12/31/2021 1559) LTG: Pt will perform upper body dressing with assistance level of: Supervision/Verbal cueing Goal: LTG Patient will perform lower body dressing w/assist (OT) Description: LTG: Patient will perform lower body dressing with assist, with/without cues in positioning using equipment (OT) Flowsheets (Taken 12/31/2021 1559) LTG: Pt will perform lower body dressing with assistance level of: Minimal Assistance - Patient > 75%   Problem: RH Toileting Goal: LTG Patient will perform toileting task (3/3 steps) with assistance level (OT) Description: LTG: Patient will perform toileting task (3/3 steps) with assistance level (OT)  Flowsheets (Taken 12/31/2021 1559) LTG: Pt will perform toileting task (3/3 steps) with assistance level: Minimal Assistance - Patient > 75%   Problem: RH Toilet Transfers Goal: LTG Patient will perform toilet transfers w/assist (OT) Description: LTG: Patient will perform toilet transfers with assist, with/without cues using equipment (OT) Flowsheets (Taken 12/31/2021 1559) LTG: Pt will perform toilet transfers with assistance level of: Minimal Assistance - Patient > 75%

## 2021-12-31 NOTE — Progress Notes (Signed)
Physical Therapy Assessment and Plan  Patient Details  Name: Brandon [name] MRN: [id number] Date of Birth: Jun 26, 1945  PT Diagnosis: Abnormal posture, Abnormality of gait, Difficulty walking, and Muscle weakness Rehab Potential: Good ELOS: 2-3 weeks   Today's Date: 12/31/2021 PT Individual Time: 0801-0900 PT Individual Time Calculation (min): 59 min    Hospital Problem: Principal Problem:   Traumatic brain injury with loss of consciousness ([place])   Past Medical History:  Past Medical History:  Diagnosis Date   Allergy    Basal cell carcinoma    GERD (gastroesophageal reflux disease)    History of kidney cancer    benign; told not cancerous   Hypertension    Past Surgical History:  Past Surgical History:  Procedure Laterality Date   CATARACT EXTRACTION Bilateral    CRANIOTOMY Bilateral 12/25/2021   Procedure: CRANIOTOMY HEMATOMA EVACUATION SUBDURAL;  Surgeon: Earnie Larsson, MD;  Location: [facility];  Service: Neurosurgery;  Laterality: Bilateral;   HERNIA REPAIR     x2   KIDNEY SURGERY Right    Had right kidney removed.    TONSILLECTOMY     VASECTOMY      Assessment & Plan Clinical Impression: This is a 76 year old male with a past medical history of hypertension who presented to the emergency room on 12/24/2021 with frequent falls.  His wife states that he has been having more issues with his balance over the last several months.  This is potentially due to increasing knee pain.  He has been using a cane for 3 weeks prior to the fall that brought him into the hospital.  His wife believes he may have fallen and hit a table in his room.  There was brief loss of consciousness.  Patient does not recall events of the fall.  Upon admission imaging revealed large bilateral subacute subdural hematomas with mass effect.  On 12/25/2021 patient underwent bilateral bur hole evacuation with drain placement by Dr. Earnie Larsson.  Postoperatively patient has begun to progress.  He has been ambulating  short distance with physical therapy but still struggling with balance and self-care tasks.  Patient's drains were removed on December 28, 2021.  He was evaluated by the inpatient rehab team and felt to be a candidate for inpatient rehab and ultimately was admitted today.  Patient currently requires mod with mobility secondary to muscle weakness, decreased coordination and decreased motor planning, and decreased standing balance, decreased postural control, and decreased balance strategies.  Prior to hospitalization, patient was modified independent  with mobility and lived with Spouse in a House home.  Home access is 1 mini step from sidewalk to porch then 1 mini step up into houseStairs to enter.  Patient will benefit from skilled PT intervention to maximize safe functional mobility, minimize fall risk, and decrease caregiver burden for planned discharge home with 24 hour supervision.  Anticipate patient will benefit from follow up [place] at discharge.  PT - End of Session Activity Tolerance: Tolerates 10 - 20 min activity with multiple rests Endurance Deficit: Yes PT Assessment Rehab Potential (ACUTE/IP ONLY): Good PT Barriers to Discharge: [place] home environment;Lack of/limited family support PT Barriers to Discharge Comments: 1-2 STE, spouse unable to assist pt, right lateral lean. PT Patient demonstrates impairments in the following area(s): Balance;Safety;Endurance;Motor PT Transfers Functional Problem(s): Bed Mobility;Bed to Chair;Car;Furniture;Floor PT Locomotion Functional Problem(s): Ambulation;Wheelchair Mobility;Stairs PT Plan PT Intensity: Minimum of 1-2 x/day ,45 to 90 minutes PT Frequency: 5 out of 7 days PT Duration Estimated Length of Stay: 2-3 weeks  PT Treatment/Interventions: Ambulation/gait training;Discharge planning;Functional mobility training;Therapeutic Activities;UE/LE Strength taining/ROM;Wheelchair propulsion/positioning;UE/LE Coordination activities;Therapeutic  Exercise;Stair training;Patient/family education;Neuromuscular re-education;Community reintegration PT Transfers Anticipated Outcome(s): supervision PT Locomotion Anticipated Outcome(s): CGA w/ least restrictive AD PT Recommendation Follow Up Recommendations: Home health PT Patient destination: Home Equipment Recommended: To be determined Equipment Details: pt has SPC only   PT Evaluation Precautions/Restrictions Precautions Precautions: Fall Restrictions Weight Bearing Restrictions: No General Chart Reviewed: Yes Family/Caregiver Present: Yes Vital Signs  Pain Pain Assessment Pain Scale: 0-10 Pain Score: 3  Pain Type: Surgical pain Pain Location: Head Pain Descriptors / Indicators: Aching Pain Intervention(s): RN made aware Pain Interference Pain Interference Pain Effect on Sleep: 1. Rarely or not at all Pain Interference with Therapy Activities: 1. Rarely or not at all Pain Interference with Day-to-Day Activities: 1. Rarely or not at all Home Living/Prior [place] expects to be discharged to:: Private residence Living Arrangements: Spouse/significant other Available Help at Discharge: Family;Available 24 hours/day Type of Home: House Home Access: Stairs to enter [employer] of Steps: 1 mini step from sidewalk to porch then 1 mini step up into house Entrance Stairs-Rails: None Home Layout: One level Bathroom Shower/Tub: Door;Walk-in Radio producer: Handicapped height Bathroom Accessibility: Yes Additional Comments: single point cane, shower stool  Lives With: Spouse Prior Function Level of Independence: Independent with gait;Independent with basic ADLs;Independent with transfers  Able to Take Stairs?: Yes Driving: Yes Vocation: Retired Tax adviser Overall Cognitive Status: Impaired/Different from baseline Arousal/Alertness: Awake/alert Orientation Level: Oriented  X4 Sensation Sensation Light Touch: Appears Intact Coordination Gross Motor Movements are Fluid and Coordinated: No Heel Shin Test: MFL Motor  Motor Motor: Abnormal postural alignment and control   Trunk/Postural Assessment  Cervical Assessment Cervical Assessment: Exceptions to [facility] (forward head) Lumbar Assessment Lumbar Assessment: Exceptions to [facility] (posterior pelvic tilt.) Postural Control Postural Control: Deficits on evaluation Righting Reactions: list to right, flexed knees.  Balance Balance Balance Assessed: Yes Static Standing Balance Static Standing - Balance Support: Bilateral upper extremity supported Static Standing - Level of Assistance: 3: Mod assist Extremity Assessment      RLE Assessment RLE Assessment: Within Functional Limits General Strength Comments: grossly 4/5 LLE Assessment LLE Assessment: Within Functional Limits General Strength Comments: grossly 4/5  Care Tool Care Tool Bed Mobility Roll left and right activity   Roll left and right assist level: Minimal Assistance - Patient > 75%    Sit to lying activity        Lying to sitting on side of bed activity   Lying to sitting on side of bed assist level: the ability to move from lying on the back to sitting on the side of the bed with no back support.: Moderate Assistance - Patient 50 - 74%     Care Tool Transfers Sit to stand transfer   Sit to stand assist level: Moderate Assistance - Patient 50 - 74%    Chair/bed transfer   Chair/bed transfer assist level: Moderate Assistance - Patient 50 - 74%     Psychologist, counselling transfer activity did not occur: Safety/medical concerns        Care Tool Locomotion Ambulation   Assist level: Moderate Assistance - Patient 50 - 74% Assistive device: Walker-rolling Max distance: 15  Walk 10 feet activity   Assist level: Moderate Assistance - Patient - 50 - 74%     Walk 50 feet with 2 turns activity Walk 50 feet with  2  turns activity did not occur: Safety/medical concerns      Walk 150 feet activity Walk 150 feet activity did not occur: Safety/medical concerns      Walk 10 feet on uneven surfaces activity Walk 10 feet on uneven surfaces activity did not occur: Safety/medical concerns      Stairs Stair activity did not occur: Safety/medical concerns        Walk up/down 1 step activity Walk up/down 1 step or curb (drop down) activity did not occur: Safety/medical concerns      Walk up/down 4 steps activity Walk up/down 4 steps activity did not occur: Safety/medical concerns      Walk up/down 12 steps activity Walk up/down 12 steps activity did not occur: Safety/medical concerns      Pick up small objects from floor Pick up small object from the floor (from standing position) activity did not occur: Safety/medical concerns      Wheelchair Is the patient using a wheelchair?: No Type of Wheelchair: Manual Wheelchair activity did not occur: Safety/medical concerns      Wheel 50 feet with 2 turns activity Wheelchair 50 feet with 2 turns activity did not occur: Safety/medical concerns    Wheel 150 feet activity Wheelchair 150 feet activity did not occur: Safety/medical concerns      Refer to Care Plan for Long Term Goals  SHORT TERM GOAL WEEK 1 PT Short Term Goal 1 (Week 1): Pt will transfer sup to sit w/ min A. PT Short Term Goal 2 (Week 1): Pt will transfer sit to stand w/ min A PT Short Term Goal 3 (Week 1): Pt will amb x 50' w/ RW and min A PT Short Term Goal 4 (Week 1): Pt will assess stairs.  Recommendations for other services: None   Skilled Therapeutic Intervention Evaluation completed (see details above and below) with education on PT POC and goals and individual treatment initiated with focus on  transfers, gait, safety, strengthening, balance and endurance.  Pt presents supine in bed and agreeable to therapy.  Pt transfers sup to sit w/ mod A and scoots to EOB.  Spouse  entersbringing clothing and pt requires assist to thread pants over feet.  Pt able to don pull over shirt w/ set-up.  Pt transfers sit to stand from elevated bed height (pt is 6'6") w/ mod A w/ flexed knees.  Pt able to correct for short periods.  Pt amb x 5' w/ mod A to w/c but at conclusion, knees flexed and reaching outside of BOS for w/c arm rest, requirig max A from PT for safe transition.  Pt amb x 15' to recliner w/ same A and improved completion of turn to recliner, but still requires A of PT for safety, even w/ verbal cues.  Pt fatigues quickly.  Pt remained in recliner w/ chair alarm on and all needs in reach, spouse present.   Mobility Bed Mobility Bed Mobility: Supine to Sit Supine to Sit: Moderate Assistance - Patient 50-74% Transfers Transfers: Sit to Stand;Stand to Sit Sit to Stand: Moderate Assistance - Patient 50-74% Stand to Sit: Moderate Assistance - Patient 50-74% Transfer (Assistive device): Rolling walker Locomotion  Gait Ambulation: Yes Gait Assistance: Moderate Assistance - Patient 50-74% Gait Distance (Feet): 15 Feet Assistive device: Rolling walker Gait Assistance Details: Verbal cues for sequencing;Verbal cues for precautions/safety;Verbal cues for safe use of DME/AE Gait Assistance Details: Pt attempts to sit before reaching seat, amb w/ flexed knees. Gait Gait: Yes Gait Pattern: Left flexed knee  in stance;Right flexed knee in stance;Lateral trunk lean to right;Decreased step length - right;Decreased step length - left Gait velocity: decreased Stairs / Additional Locomotion Stairs: No Wheelchair Mobility Wheelchair Mobility: No   Discharge Criteria: Patient will be discharged from PT if patient refuses treatment 3 consecutive times without medical reason, if treatment goals not met, if there is a change in medical status, if patient makes no progress towards goals or if patient is discharged from hospital.  The above assessment, treatment plan, treatment  alternatives and goals were discussed and mutually agreed upon: by patient and by family  Ladoris Gene 12/31/2021, 12:19 PM

## 2021-12-31 NOTE — Progress Notes (Signed)
VASCULAR LAB    Bilateral lower extremity venous duplex has been performed.  See CV proc for preliminary results.   Hamsini Verrilli, RVT 12/31/2021, 12:19 PM

## 2021-12-31 NOTE — Progress Notes (Signed)
Pt very drowsy today and wife is concerned with his cognition. Upon admission yesterday pt was able to answer questions appropriately and hold a conversation and was also A&Ox4. Today he has been A&O to self and place.   Brandon [name]

## 2021-12-31 NOTE — Discharge Instructions (Addendum)
Inpatient Rehab Discharge Instructions  ANJELO PULLMAN Discharge date and time: No discharge date for patient encounter.   Activities/Precautions/ Functional Status: Activity: As tolerated Diet: Regular Wound Care: Routine skin checks Functional status:  ___ No restrictions     ___ Walk up steps independently ___ 24/7 supervision/assistance   ___ Walk up steps with assistance ___ Intermittent supervision/assistance  ___ Bathe/dress independently ___ Walk with walker     _x__ Bathe/dress with assistance ___ Walk Independently    ___ Shower independently ___ Walk with assistance    ___ Shower with assistance ___ No alcohol     ___ Return to work/school ________  COMMUNITY REFERRALS UPON DISCHARGE:    Home Health:   PT     OT     ST     SNA                   Agency: [facility]     Phone: [phone] *Please expect follow-up within 2-3 days to schedule your home visit. If you have not received follow-up, be sure to contact the branch directly.*  Medical Equipment/Items Ordered: rolling walker and transport chair                                                 Agency/Supplier: [facility] [id number]    Special Instructions: No driving smoking or alcohol   My questions have been answered and I understand these instructions. I will adhere to these goals and the provided educational materials after my discharge from the hospital.  Patient/Caregiver Signature _______________________________ Date __________  Clinician Signature _______________________________________ Date __________  Please bring this form and your medication list with you to all your follow-up doctor's appointments.

## 2021-12-31 NOTE — Progress Notes (Signed)
PROGRESS NOTE   Subjective/Complaints: Pt had pretty good night. Nurse concurs. Mild headache. Right knee a little sore at rest  ROS: Patient denies fever, rash, sore throat, blurred vision, dizziness, nausea, vomiting, diarrhea, cough, shortness of breath or chest pain   Objective:   No results found. No results for input(s): "WBC", "HGB", "HCT", "PLT" in the last 72 hours. No results for input(s): "NA", "K", "CL", "CO2", "GLUCOSE", "BUN", "CREATININE", "CALCIUM" in the last 72 hours.  Intake/Output Summary (Last 24 hours) at 12/31/2021 0954 Last data filed at 12/31/2021 0745 Gross per 24 hour  Intake 240 ml  Output 300 ml  Net -60 ml        Physical Exam: Vital Signs Blood pressure 127/69, pulse 96, temperature 98.8 F (37.1 C), temperature source Oral, resp. rate 17, SpO2 99 %.  General: Alert and oriented x 3, No apparent distress HEENT: Head is normocephalic, atraumatic, PERRLA, EOMI, sclera anicteric, oral mucosa pink and moist, dentition intact, ext ear canals clear,  Neck: Supple without JVD or lymphadenopathy Heart: Reg rate and rhythm. No murmurs rubs or gallops Chest: CTA bilaterally without wheezes, rales, or rhonchi; no distress Abdomen: Soft, non-tender, non-distended, bowel sounds positive. Extremities: No clubbing, cyanosis, or edema. Pulses are 2+ Psych: Pt's affect is appropriate. Pt is cooperative Skin: crani incisions cdi with staples.  Neuro:  pt is alert, oriented to name, cone, month/year. Hoh. Follows basic commands. Stm deficits. Fair insight. No focal CN findings.  Musculoskeletal: mild crepitus bilaterally. Jt line pain with palpation actually in left knee more than right. No effusion    Assessment/Plan: 1. Functional deficits which require 3+ hours per day of interdisciplinary therapy in a comprehensive inpatient rehab setting. Physiatrist is providing close team supervision and 24 hour  management of active medical problems listed below. Physiatrist and rehab team continue to assess barriers to discharge/monitor patient progress toward functional and medical goals  Care Tool:  Bathing              Bathing assist       Upper Body Dressing/Undressing Upper body dressing        Upper body assist      Lower Body Dressing/Undressing Lower body dressing            Lower body assist       Toileting Toileting    Toileting assist       Transfers Chair/bed transfer  Transfers assist           Locomotion Ambulation   Ambulation assist              Walk 10 feet activity   Assist           Walk 50 feet activity   Assist           Walk 150 feet activity   Assist           Walk 10 feet on uneven surface  activity   Assist           Wheelchair     Assist               Wheelchair 50 feet with 2 turns  activity    Assist            Wheelchair 150 feet activity     Assist          Blood pressure 127/69, pulse 96, temperature 98.8 F (37.1 C), temperature source Oral, resp. rate 17, SpO2 99 %.  Medical Problem List and Plan: 1. Functional deficits secondary to bilateral SDH's after fall s/p bilateral burr hole evacuation 8/14             -patient may shower             -ELOS/Goals: 12-14 days, supervision to mod I goals with PT, OT, SLP  -Patient is beginning CIR therapies today including PT, OT, and SLP  2.  Antithrombotics: -DVT/anticoagulation:  Mechanical:  Antiembolism stockings, knee (TED hose) Bilateral lower extremities Sequential compression devices, below knee Bilateral lower extremities             -antiplatelet therapy: n/a 3. Pain Management/headache: tylenol for mild pain, hydrocodone for more severe pain             -consider topamax trial if needed but headaches appear relatively mild at this point 4. Mood/Behavior/Sleep: trazodone prn for sleep             -pt  seems fairly up beat and wife is very supportive             -antipsychotic agents: n/a 5. Neuropsych/cognition: This patient is not quite capable of making decisions on his own behalf. 6. Skin/Wound Care: local care to scalp, remove staples later this week 7. Fluids/Electrolytes/Nutrition: encourage appropriate po intake             -liberalized him to regular diet to help with po intake. Encouraged wife to bring food from home as well. Poor po intake so far however.              -check labs Monday  8. Seizure prophylaxis: keppra '500mg'$  q 12 hours 9. Hx of Prostate Cancer             -flomax 0.'4mg'$  bid, proscar '5mg'$  daily             -emptying with intermittent incontinence thus far 10. HTN:              -hctz/lisinopril daily per home regimen             -monitor bp as he mobilizes more with therapy  8/20 bp controlled 11. Osteoarthritis bilateral knees, R>L             -voltaren gel to both knees             -observe knees with PT, might benefit from some type of bracing             -his knee pain certainly was contributing to his recent falls and balance deficits in the months prior to this admit     LOS: 1 days A FACE TO FACE EVALUATION WAS PERFORMED  Meredith Staggers 12/31/2021, 9:54 AM

## 2022-01-01 LAB — COMPREHENSIVE METABOLIC PANEL
ALT: 17 U/L (ref 0–44)
AST: 15 U/L (ref 15–41)
Albumin: 2.6 g/dL — ABNORMAL LOW (ref 3.5–5.0)
Alkaline Phosphatase: 47 U/L (ref 38–126)
Anion gap: 5 (ref 5–15)
BUN: 19 mg/dL (ref 8–23)
CO2: 29 mmol/L (ref 22–32)
Calcium: 8.6 mg/dL — ABNORMAL LOW (ref 8.9–10.3)
Chloride: 103 mmol/L (ref 98–111)
Creatinine, Ser: 0.96 mg/dL (ref 0.61–1.24)
GFR, Estimated: >60 mL/min (ref >60)
Glucose, Bld: 149 mg/dL — ABNORMAL HIGH (ref 70–99)
Potassium: 3.7 mmol/L (ref 3.5–5.1)
Sodium: 137 mmol/L (ref 135–145)
Total Bilirubin: 1.4 mg/dL — ABNORMAL HIGH (ref 0.3–1.2)
Total Protein: 5.7 g/dL — ABNORMAL LOW (ref 6.5–8.1)

## 2022-01-01 LAB — CBC
HCT: 36 % — ABNORMAL LOW (ref 39.0–52.0)
Hemoglobin: 12.4 g/dL — ABNORMAL LOW (ref 13.0–17.0)
MCH: 30.4 pg (ref 26.0–34.0)
MCHC: 34.4 g/dL (ref 30.0–36.0)
MCV: 88.2 fL (ref 80.0–100.0)
Platelets: 181 10*3/uL (ref 150–400)
RBC: 4.08 MIL/uL — ABNORMAL LOW (ref 4.22–5.81)
RDW: 12.6 % (ref 11.5–15.5)
WBC: 8.9 10*3/uL (ref 4.0–10.5)
nRBC: 0 % (ref 0.0–0.2)

## 2022-01-01 NOTE — Progress Notes (Signed)
Physical Therapy Session Note  Patient Details  Name: Brandon [name] MRN: [id number] Date of Birth: 1945-12-23  Today's Date: 01/01/2022 PT Individual Time: 1535-1630 PT Individual Time Calculation (min): 55 min   Short Term Goals: Week 1:  PT Short Term Goal 1 (Week 1): Pt will transfer sup to sit w/ min A. PT Short Term Goal 2 (Week 1): Pt will transfer sit to stand w/ min A PT Short Term Goal 3 (Week 1): Pt will amb x 50' w/ RW and min A PT Short Term Goal 4 (Week 1): Pt will assess stairs.  Skilled Therapeutic Interventions/Progress Updates:     Pt received supine in bed and agrees to therapy. Reports pain in head and is aware that pain likely coming from SDH s/p fall. PT provides rest breaks as needed to manage pain. Pt performs bilateral rolling with modA to assist with pericare and breif change. Supine to sit with modA and cues for logrolling, hand placement, and sequencing. Pt performs Sit to stand from elevated side of bed with modA and cues for hand placement and sequencing. Stand pivot transfer to [facility] with modA/maxA and cues for sequencing and positioning. Pt reaches for wall and other objects for support, including reaching for and tilting WC on its wheels in unsafe manner. WC transport to gym for time management. Pt noted to have WC that is too short for pt, who is ~6'7". PT retrieves taller [facility] and brings to dayroom. Pt attempts sit to stand with RW and is able to achieve 75% of full stand with modA, but appears unable to extend knees/hips/trunk fully, instead deciding to sit back down. Following seated rest break pt attempts again with same results. PT adjusts and cues pt to perform squat pivot/lateral transfer to new WC. Pt again is able to clear buttocks but unable to motor plan or complete transfer, having to be assisted back to initial WC for safety. PT attempts several maxA scoots to initiate transfer but pt has strong posterior bias and unable to clear buttock when pt is not  providing sufficient effort. WC transport back to room. Squat pivot back to bed with totalA +2 with help from rehab tech. Sit to supine with totalA +2 and cues for sequencing. Pt let supine with alarm intact and all needs within reach.  Therapy Documentation Precautions:  Precautions Precautions: Fall Restrictions Weight Bearing Restrictions: No   Therapy/Group: Individual Therapy  Breck Coons, PT, DPT 01/01/2022, 4:46 PM

## 2022-01-01 NOTE — Progress Notes (Signed)
Inpatient Rehabilitation  Patient information reviewed and entered into eRehab system by Jasmine Maceachern Rayna Brenner, OTR/L, Rehab Quality Coordinator.   Information including medical coding, functional ability and quality indicators will be reviewed and updated through discharge.   

## 2022-01-01 NOTE — Progress Notes (Signed)
Occupational Therapy Note  Patient Details  Name: Brandon [name] MRN: [id number] Date of Birth: 1945-05-22  Today's Date: 01/01/2022 OT Missed Time: 70 Minutes Missed Time Reason: Patient fatigue  Pt received supine in bed asleep, pt arouses briefly but then returns to deep slumber, unable to arouse fully to participate in session. Will f/u as time allows for OT session.    Corinne Ports [facility] 01/01/2022, 1:26 PM

## 2022-01-01 NOTE — Progress Notes (Signed)
Patient ID: Brandon [name], male   DOB: 09-11-1945, 76 y.o.   MRN: [id number]  SW left message for pt wife Curt Bears [phone]) to introduce self, explain role, discuss discharge process and inform on ELOS. SW shared will follow-up tomorrow after team conference with updates. Encouraged follow-up if questions/concerns prior to this.   Loralee Pacas, MSW, [place] Office: [phone] Cell: [phone] Fax: [phone]

## 2022-01-01 NOTE — Care Management (Signed)
[facility] Individual Statement of Services  Patient Name:  Brandon [name]  Date:  01/01/2022  Welcome to the [facility].  Our goal is to provide you with an individualized program based on your diagnosis and situation, designed to meet your specific needs.  With this comprehensive rehabilitation program, you will be expected to participate in at least 3 hours of rehabilitation therapies Monday-Friday, with modified therapy programming on the weekends.  Your rehabilitation program will include the following services:  Physical Therapy (PT), Occupational Therapy (OT), Speech Therapy (ST), 24 hour per day rehabilitation nursing, Therapeutic Recreaction (TR), Psychology, Neuropsychology, Care Coordinator, Rehabilitation Medicine, [place], and Other  Weekly team conferences will be held on Tuesdays to discuss your progress.  Your Inpatient Rehabilitation Care Coordinator will talk with you frequently to get your input and to update you on team discussions.  Team conferences with you and your family in attendance may also be held.  Expected length of stay: 2-3 weeks    Overall anticipated outcome: Contact [facility]  Depending on your progress and recovery, your program may change. Your Inpatient Rehabilitation Care Coordinator will coordinate services and will keep you informed of any changes. Your Inpatient Rehabilitation Care Coordinator's name and contact numbers are listed  below.  The following services may also be recommended but are not provided by the [facility] will be made to provide these services after discharge if needed.  Arrangements include referral to agencies that provide these services.  Your insurance has been verified to be:  [employer]  Your  primary doctor is:  Film/video editor  Pertinent information will be shared with your doctor and your insurance company.  Inpatient Rehabilitation Care Coordinator:  Cathleen Corti [phone] or ([phone]  Information discussed with and copy given to patient by: Rana Snare, 01/01/2022, 9:12 AM

## 2022-01-01 NOTE — Progress Notes (Signed)
PROGRESS NOTE   Subjective/Complaints:  Pt seen and evaluated at bedside with AM with wife and son. Report overnight urinary incontinence due to urgency remains an issue, has been using condom catheter in the past. Explained risk of worsening incontinence with use of condom catheter, along with poor home availability, and family understanding that use of this is a last resort to maintain cleanliness.   Pt denies headache or knee pain currently. Does state voltaren gel has helped with knee pain.   Nursing and family note waxing and waning orientation and lethargy since admission to IPR. Discussed risks of delirium and basic interventions such as frequent reorientation and promoting daytime wakefulness/nighttime rest.  Reported 1x few clots with urination overnight. Has not occurred before. Does have + Hx prostate CA, currently under monitoring with last CT for f/u scheduled in September. Has not needed ISC. No foul urine, dysuria, fevers, chills, or flank pain.   ROS: Patient denies fever, rash, sore throat, blurred vision, dizziness, nausea, vomiting, diarrhea, cough, shortness of breath or chest pain   Objective:   VAS [place] LOWER EXTREMITY VENOUS (DVT)  Result Date: 12/31/2021  Lower Venous DVT Study Summary: BILATERAL: - No evidence of deep vein thrombosis seen in the lower extremities, bilaterally. - RIGHT: - A cystic structure is found in the popliteal fossa measuring 2.23 cm X 3.50 cm.  LEFT: - A cystic structure is found in the popliteal fossa measuring 4 cm X 2.23 cm.  *See table(s) above for measurements and observations. Electronically signed by Jamelle Haring on 12/31/2021 at 2:59:05 PM.    Final    Recent Labs    01/01/22 0608  WBC 8.9  HGB 12.4*  HCT 36.0*  PLT 181   Recent Labs    01/01/22 0608  NA 137  K 3.7  CL 103  CO2 29  GLUCOSE 149*  BUN 19  CREATININE 0.96  CALCIUM 8.6*    Intake/Output Summary (Last  24 hours) at 01/01/2022 1019 Last data filed at 01/01/2022 0856 Gross per 24 hour  Intake 265 ml  Output --  Net 265 ml         Physical Exam: Vital Signs Blood pressure 114/74, pulse 90, temperature 97.7 F (36.5 C), temperature source Oral, resp. rate 18, height 6' 6.5" (1.994 m), SpO2 93 %.  General: Alert and oriented to self; time and place with cueing.  HEENT: ERRLA, EOMI, sclera anicteric, oral mucosa pink and moist, dentition intact, ext ear canals clear. + HOH, b/l hearing aides.  Neck: Supple without JVD or lymphadenopathy Heart: Reg rate and rhythm. No murmurs rubs or gallops Chest: CTA bilaterally without wheezes, rales, or rhonchi; no distress Abdomen: Soft, non-tender, non-distended, bowel sounds positive. Extremities: No clubbing, cyanosis, or edema. Pulses are 2+ Psych: Pt's affect is appropriate. Pt is cooperative Skin: crani incisions cdi with staples intact.  Neuro: Mild recall delay, exacerbated by [facility]. Naming intact. CN 2-12 intact. Moves all 4 extremities antigravity.  Musculoskeletal:  No effusion or obvious deformity.    Assessment/Plan: 1. Functional deficits which require 3+ hours per day of interdisciplinary therapy in a comprehensive inpatient rehab setting. Physiatrist is providing close team supervision and 24 hour  management of active medical problems listed below. Physiatrist and rehab team continue to assess barriers to discharge/monitor patient progress toward functional and medical goals  Care Tool:  Bathing    Body parts bathed by patient: Face, Front perineal area   Body parts bathed by helper: Right arm, Left arm, Chest, Abdomen, Buttocks, Right upper leg, Left upper leg, Right lower leg, Left lower leg     Bathing assist Assist Level: Maximal Assistance - Patient 24 - 49%     Upper Body Dressing/Undressing Upper body dressing   What is the patient wearing?: Pull over shirt    Upper body assist Assist Level: Maximal Assistance -  Patient 25 - 49%    Lower Body Dressing/Undressing Lower body dressing      What is the patient wearing?: Pants, Incontinence brief     Lower body assist Assist for lower body dressing: 2 Helpers     Toileting Toileting    Toileting assist Assist for toileting: 2 Helpers     Transfers Chair/bed transfer  Transfers assist  Chair/bed transfer activity did not occur: Safety/medical concerns  Chair/bed transfer assist level: Dependent - mechanical lift     Locomotion Ambulation   Ambulation assist      Assist level: Moderate Assistance - Patient 50 - 74% Assistive device: Walker-rolling Max distance: 15   Walk 10 feet activity   Assist     Assist level: Moderate Assistance - Patient - 50 - 74%     Walk 50 feet activity   Assist Walk 50 feet with 2 turns activity did not occur: Safety/medical concerns         Walk 150 feet activity   Assist Walk 150 feet activity did not occur: Safety/medical concerns         Walk 10 feet on uneven surface  activity   Assist Walk 10 feet on uneven surfaces activity did not occur: Safety/medical concerns         Wheelchair     Assist Is the patient using a wheelchair?: No Type of Wheelchair: Manual Wheelchair activity did not occur: Safety/medical concerns         Wheelchair 50 feet with 2 turns activity    Assist    Wheelchair 50 feet with 2 turns activity did not occur: Safety/medical concerns       Wheelchair 150 feet activity     Assist  Wheelchair 150 feet activity did not occur: Safety/medical concerns       Blood pressure 114/74, pulse 90, temperature 97.7 F (36.5 C), temperature source Oral, resp. rate 18, height 6' 6.5" (1.994 m), SpO2 93 %.  Medical Problem List and Plan: 1. Functional deficits secondary to bilateral SDH's after fall s/p bilateral burr hole evacuation 8/14             -patient may shower             -ELOS/Goals: 12-14 days, supervision to mod I  goals with PT, OT, SLP  -CIR therapies including PT, OT, and SLP  2.  Antithrombotics: -DVT/anticoagulation:  Mechanical:  Antiembolism stockings, knee (TED hose) Bilateral lower extremities Sequential compression devices, below knee Bilateral lower extremities             -antiplatelet therapy: n/a 3. Pain Management/headache: tylenol for mild pain, hydrocodone for more severe pain             -consider topamax trial if needed but headaches appear relatively mild at this point 4. Mood/Behavior/Sleep: trazodone prn for  sleep             -pt seems fairly up beat and wife is very supportive             -antipsychotic agents: n/a 5. Neuropsych/cognition: This patient is not quite capable of making decisions on his own behalf. 6. Skin/Wound Care: local care to scalp, remove staples later this week 7. Fluids/Electrolytes/Nutrition: encourage appropriate po intake             -liberalized him to regular diet to help with po intake. Encouraged wife to bring food from home as well. Poor po intake so far however.              -check labs Monday  8. Seizure prophylaxis: keppra '500mg'$  q 12 hours 9. Hx of Prostate Cancer             -flomax 0.'4mg'$  bid, proscar '5mg'$  daily             -emptying with intermittent incontinence thus far              - UA 8/21 for blood clots, urinary urgency, and increased confusion  10. HTN:              -hctz/lisinopril daily per home regimen             -monitor bp as he mobilizes more with therapy  -  bp controlled 11. Osteoarthritis bilateral knees, R>L             -voltaren gel to both knees - improved pain             -observe knees with PT, might benefit from some type of bracing             -his knee pain certainly was contributing to his recent falls and balance deficits in the months prior to this admit  12. Confusion - Delirium vs. UTI as above vs. SDH as above  - UA as above 8/21; other labs with AM look good, no leukocytosis, no fever, HD stable - Delirium  precautions added and discussed with family - Will closely observe for objective changes in neurologic status, low threshold for repeat CT head  LOS: 2 days A FACE TO FACE EVALUATION WAS PERFORMED  Gertie Gowda 01/01/2022, 10:19 AM

## 2022-01-01 NOTE — Progress Notes (Signed)
Physical Therapy Session Note  Patient Details  Name: Brandon [name] MRN: [id number] Date of Birth: [birth year]  Today's Date: 01/01/2022 PT Individual Time: 0240-9735 PT Individual Time Calculation (min): 26 min  Today's Date: 01/01/2022 PT Missed Time: 49 Minutes Missed Time Reason: Patient fatigue  Short Term Goals: Week 1:  PT Short Term Goal 1 (Week 1): Pt will transfer sup to sit w/ min A. PT Short Term Goal 2 (Week 1): Pt will transfer sit to stand w/ min A PT Short Term Goal 3 (Week 1): Pt will amb x 50' w/ RW and min A PT Short Term Goal 4 (Week 1): Pt will assess stairs.  Skilled Therapeutic Interventions/Progress Updates:   Received pt semi-reclined in bed with wife and son, RN, and MD present at bedside. Pt agreeable to PT treatment and denied any pain during session. Pt's family reporting BP numbers were abnormal this morning and requesting updated reading. BP in supine: 108/64 and pt asymptomatic. Pt rolled L with min A and max cues for technique and transferred L sidelying<>sitting EOB with max A for trunk control. Upon sitting upright, pt with posterior lean but able to correct with max verbal cues and maintain static sitting balance with min A. However, with fatigue, required max of 1/mod of 2 as pt leaning posteriorly and to the R, then leaning too far forward despite cues. Reassessed BP in sitting: 108/64. Pt with difficulty expressing how he was feeling but did not look well and stated "yes" when therapist asked if he felt weak and tired. Pt's son requested pt lie back down and when asked, pt did verbalize desire to lie back down. Sit<>supine with max A and scooted to [facility] with supervision (pulling on headboard). Concluded session with pt semi-reclined in bed asleep, needs within reach, bed alarm on, and family present at bedside. 49 minutes missed of skilled physical therapy due to fatigue.   Therapy Documentation Precautions:  Precautions Precautions:  Fall Restrictions Weight Bearing Restrictions: No   Therapy/Group: Individual Therapy Alfonse Alpers PT, DPT  01/01/2022, 6:59 AM

## 2022-01-01 NOTE — Progress Notes (Signed)
Speech Language Pathology TBI Note  Patient Details  Name: Brandon [name] MRN: [id number] Date of Birth: 01/03/1946  Today's Date: 01/01/2022 SLP Individual Time: 0730-0830 SLP Individual Time Calculation (min): 60 min  Short Term Goals: Week 1: SLP Short Term Goal 1 (Week 1): Pt will sustain his attention to basic, familiar tasks for 3-5 minute intervals wtih mod cues for redirection. SLP Short Term Goal 2 (Week 1): Pt will utilize external aids to recall daily information with mod cues. SLP Short Term Goal 3 (Week 1): Pt will initiate functional communication with mod assist. SLP Short Term Goal 4 (Week 1): Pt will utilize an increased vocal intensity to achieve intelligibility at the sentence level with mod assist.  Skilled Therapeutic Interventions: Skilled treatment session focused on cognitive goals. Upon arrival, patient was awake in bed but appeared lethargic. SLP facilitated session by providing extra time, multiple repetitions, and overall Max A multimodal cues for initiation of functional tasks and verbal expression with an overall flat affect observed. However, suspect poor initiation of verbal expression is impacted by patient being severely hard of hearing with poorly working hearing aids. Patient self-fed ~ 10% of his breakfast meal with verbal and visual cues needed. Patient attempted to use the urinal but demonstrated difficulty resulting in spillage. Max A multimodal cues were needed for bed mobility to donn a new brief. By end of session, patient with improved verbal expression and was able to name functional items and read at the paragraph level with extra time. Patient also generated a basic phrase level utterance regarding actions within a picture with min verbal cues. Patient's wife and son present throughout session and educated regarding goals of care and provided TBI education. Patient left semi-reclined in bed with alarm on and all needs within reach. Continue with current  plan of care.      Pain 5/10 headache Nursing made aware and administered medications   Agitated Behavior Scale: TBI Observation Details Observation Environment: CIR Start of observation period - Date: 01/01/22 Start of observation period - Time: 0730 End of observation period - Date: 01/01/22 End of observation period - Time: 0830 Agitated Behavior Scale (DO NOT LEAVE BLANKS) Short attention span, easy distractibility, inability to concentrate: Present to a slight degree Impulsive, impatient, low tolerance for pain or frustration: Absent Uncooperative, resistant to care, demanding: Absent Violent and/or threatening violence toward people or property: Absent Explosive and/or unpredictable anger: Absent Rocking, rubbing, moaning, or other self-stimulating behavior: Absent Pulling at tubes, restraints, etc.: Absent Wandering from treatment areas: Absent Restlessness, pacing, excessive movement: Absent Repetitive behaviors, motor, and/or verbal: Absent Rapid, loud, or excessive talking: Absent Sudden changes of mood: Absent Easily initiated or excessive crying and/or laughter: Absent Self-abusiveness, physical and/or verbal: Absent Agitated behavior scale total score: 15  Therapy/Group: Individual Therapy  Tonnie Friedel 01/01/2022, 8:45 AM

## 2022-01-01 NOTE — Plan of Care (Signed)
Update care plan

## 2022-01-02 DIAGNOSIS — Z9889 Other specified postprocedural states: Secondary | ICD-10-CM

## 2022-01-02 DIAGNOSIS — S069X9S Unspecified intracranial injury with loss of consciousness of unspecified duration, sequela: Secondary | ICD-10-CM

## 2022-01-02 DIAGNOSIS — S065XAA Traumatic subdural hemorrhage with loss of consciousness status unknown, initial encounter: Secondary | ICD-10-CM

## 2022-01-02 DIAGNOSIS — R4189 Other symptoms and signs involving cognitive functions and awareness: Secondary | ICD-10-CM

## 2022-01-02 LAB — CBC
HCT: 37.6 % — ABNORMAL LOW (ref 39.0–52.0)
Hemoglobin: 13.1 g/dL (ref 13.0–17.0)
MCH: 30.8 pg (ref 26.0–34.0)
MCHC: 34.8 g/dL (ref 30.0–36.0)
MCV: 88.5 fL (ref 80.0–100.0)
Platelets: 199 10*3/uL (ref 150–400)
RBC: 4.25 MIL/uL (ref 4.22–5.81)
RDW: 12.5 % (ref 11.5–15.5)
WBC: 7.4 10*3/uL (ref 4.0–10.5)
nRBC: 0 % (ref 0.0–0.2)

## 2022-01-02 MED ORDER — ACETAMINOPHEN 500 MG PO TABS
1000.0000 mg | ORAL_TABLET | Freq: Three times a day (TID) | ORAL | Status: DC
  Administered 2022-01-02 – 2022-01-05 (×9): 1000 mg via ORAL
  Filled 2022-01-02 (×9): qty 2

## 2022-01-02 NOTE — Progress Notes (Signed)
Speech Language Pathology TBI Note  Patient Details  Name: Brandon [name] MRN: [id number] Date of Birth: 1945/07/04  Today's Date: 01/02/2022 SLP Individual Time: 0915-1000 SLP Individual Time Calculation (min): 45 min  Short Term Goals: Week 1: SLP Short Term Goal 1 (Week 1): Pt will sustain his attention to basic, familiar tasks for 3-5 minute intervals wtih mod cues for redirection. SLP Short Term Goal 2 (Week 1): Pt will utilize external aids to recall daily information with mod cues. SLP Short Term Goal 3 (Week 1): Pt will initiate functional communication with mod assist. SLP Short Term Goal 4 (Week 1): Pt will utilize an increased vocal intensity to achieve intelligibility at the sentence level with mod assist.  Skilled Therapeutic Interventions: Skilled treatment session focused on cognitive goals. Upon arrival, patient was awake while upright in the tilt-in-space wheelchair. SLP facilitated session by administering the [place] Status Examination (SLUMS). Patient scored  10/30 points with a score of 27 or above considered normal. Patient demonstrated deficits in attention, recall, and problem solving. Throughout session, patient was mildly restless and urgently reported he need to void. Patient was incontinent of urine and SLP donned a new brief with patient standing in the [place] with +2 assist for safety. Patient with improved verbal expression today but ineligibility was reduced due to a low intensity and hoarse vocal quality. Patient left with nursing in room administering medications. Continue with current plan of care.       Pain No/Denies Pain   Agitated Behavior Scale: TBI Observation Details Observation Environment: CIR Start of observation period - Date: 01/02/22 Start of observation period - Time: 0915 End of observation period - Date: 01/02/22 End of observation period - Time: 1000 Agitated Behavior Scale (DO NOT LEAVE BLANKS) Short attention  span, easy distractibility, inability to concentrate: Present to a slight degree Impulsive, impatient, low tolerance for pain or frustration: Absent Uncooperative, resistant to care, demanding: Absent Violent and/or threatening violence toward people or property: Absent Explosive and/or unpredictable anger: Absent Rocking, rubbing, moaning, or other self-stimulating behavior: Absent Pulling at tubes, restraints, etc.: Absent Wandering from treatment areas: Absent Restlessness, pacing, excessive movement: Absent Repetitive behaviors, motor, and/or verbal: Absent Rapid, loud, or excessive talking: Absent Sudden changes of mood: Absent Easily initiated or excessive crying and/or laughter: Absent Self-abusiveness, physical and/or verbal: Absent Agitated behavior scale total score: 15  Therapy/Group: Individual Therapy  Ouita Nish 01/02/2022, 11:57 AM

## 2022-01-02 NOTE — Progress Notes (Signed)
Inpatient Rehabilitation Care Coordinator Assessment and Plan Patient Details  Name: Brandon [name] MRN: [id number] Date of Birth: 23-Jun-1945  Today's Date: 01/02/2022  Hospital Problems: Principal Problem:   Traumatic brain injury with loss of consciousness [facility])  Past Medical History:  Past Medical History:  Diagnosis Date   Allergy    Basal cell carcinoma    GERD (gastroesophageal reflux disease)    History of kidney cancer    benign; told not cancerous   Hypertension    Past Surgical History:  Past Surgical History:  Procedure Laterality Date   CATARACT EXTRACTION Bilateral    CRANIOTOMY Bilateral 12/25/2021   Procedure: CRANIOTOMY HEMATOMA EVACUATION SUBDURAL;  Surgeon: Earnie Larsson, MD;  Location: [facility];  Service: Neurosurgery;  Laterality: Bilateral;   HERNIA REPAIR     x2   KIDNEY SURGERY Right    Had right kidney removed.    TONSILLECTOMY     VASECTOMY     Social History:  reports that he has quit smoking. He has never used smokeless tobacco. He reports current alcohol use. He reports that he does not use drugs.  Family / Support Systems Marital Status: Married How Long?: 34 1/2 yrs Patient Roles: Spouse, Parent Spouse/Significant Other: Curt Bears (wife): [phone] Children: Three children- Jonni Sanger (son-lives in the home) and Gerald Stabs (son- [place] Company secretary [place] and dtr Anderson Malta (lives in [place], [place]). Other Supports: none reported Anticipated Caregiver: Pt wife and son Jonni Sanger Ability/Limitations of Caregiver: No concerns reported Caregiver Availability: 24/7 Family Dynamics: Pt lives in the home with his wife, and their son Jonni Sanger lives in the home as well.  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 Education: college Field seismologist - How often do you need to have someone help you when you read instructions, pamphlets, or  other written material from your doctor or pharmacy?: Never Writes: Yes Employment Status: Retired Date Retired/Disabled/Unemployed: 31 Age Retired: 40 Public relations account executive Issues: Wife denies Guardian/Conservator: N/A   Abuse/Neglect Abuse/Neglect Assessment Can Be Completed: Unable to assess, patient is non-responsive or altered mental status Physical Abuse: Denies Verbal Abuse: Denies Sexual Abuse: Denies Exploitation of patient/patient's resources: Denies Self-Neglect: Denies  Patient response to: Social Isolation - How often do you feel lonely or isolated from those around you?: Patient unable to respond  Emotional Status Pt's affect, behavior and adjustment status: Pt was sleeping through duration of visit. Recent Psychosocial Issues: Wife denies Psychiatric History: Wife denies any known hx Substance Abuse History: Wife reports he quit smoking cigarettes in 1980, and rarely drinks alcohool. Denies rec drug use.  Patient / Family Perceptions, Expectations & Goals Pt/Family understanding of illness & functional limitations: Pt wife and family have a general understanding of pt care needs Premorbid pt/family roles/activities: Independent Anticipated changes in roles/activities/participation: Assistance with ADLs/IADLs Pt/family expectations/goals: Pt wife's goal is for him to hopefully be back to where he was.  Community Resources [employer] Scripts: None Premorbid Home Care/DME Agencies: None Transportation available at discharge: TBD Is the patient able to respond to transportation needs?: Yes In the past 12 months, has lack of transportation kept you from medical appointments or from getting medications?: No In the past 12 months, has lack of transportation kept you from meetings, work, or from getting things needed for daily living?: No Resource referrals recommended: Neuropsychology  Discharge Planning Living Arrangements: Spouse/significant other,  Children Support Systems: Spouse/significant other, Children Type of Residence: Private residence  Insurance Resources: Multimedia programmer (specify) Personnel officer Medicare) Financial Resources: Radio broadcast assistant Screen Referred: No Living Expenses: Own Money Management: Patient, Spouse Does the patient have any problems obtaining your medications?: No Home Management: Pt wife reports she primarily cooks, however, everyone helps with all homecare needs Patient/Family Preliminary Plans: No changes Care Coordinator Barriers to Discharge: Decreased caregiver support, Lack of/limited family support, Insurance for SNF coverage Care Coordinator Anticipated Follow Up Needs: HH/OP Expected length of stay: 2-3 weeks  Clinical Impression SW met with pt wife in room to introduce self, explain role,and discuss d/c process. She provided collateral information for assessment. Pt is not a English as a second language teacher. No HCPOA. DME: canes.   Rana Snare 01/02/2022, 9:43 AM

## 2022-01-02 NOTE — Progress Notes (Signed)
Occupational Therapy TBI Note  Patient Details  Name: Brandon [name] MRN: [id number] Date of Birth: 14-May-1946  Today's Date: 01/02/2022 OT Individual Time: 0820-0915 OT Individual Time Calculation (min): 55 min    Short Term Goals: Week 1:  OT Short Term Goal 1 (Week 1): Pt will demonstrate improved sitting balance and activity tolerance while completing self care tasks seated on EOB with Min A to maintain balance OT Short Term Goal 2 (Week 1): Pt will complete sit to stand with RW with Mod Assist x1. OT Short Term Goal 3 (Week 1): Pt will increase UB dressing to Min Assist OT Short Term Goal 4 (Week 1): Pt will increase toilet transfer to Mod A x1 using RW  Skilled Therapeutic Interventions/Progress Updates:    Pt greeted semi-reclined in bed with wife present. Pt would not answer OT's questions, but when DO entered, he was more conversational and answered questions. OT obtained larger TIS wc for better fit for pt's tall frame and to improve OOB tolerance. Utilized backwards chaining for donning pants at bed level. After OT thread pant legs, pt able to reach forward to help pull them up tights and initiated bridging but feet kept slipping she he could not achieve enough extension to get pants up. OT donned knee high TEDS for circulation using friction reducing device. Pt initiated bed mobility with min cues, but and mod A to elevate trunk. Once in sitting, pt with strong posterior lean. Max verbal cues and facilitation for anterior weight shift. Stedy transfer from bed to TIS wc with Max A +2 and difficulty achieving full hip/trunk extension. Pt brought to the sink and instructed to wash his face. Pt able to sequence face washing task with only min cues for thoroughness. When asked orientation questions, pt stated he was at a house, but knew month and year. Attempted 3 more sit<>stands from [facility] using mirror feedback to encourage full upright position, however pt still unable to get hips fully  extended. Pt handoff to SLP for next therapy session.   Therapy Documentation Precautions:  Precautions Precautions: Fall Restrictions Weight Bearing Restrictions: No  Pain: Pain Assessment Pain Scale: 0-10 Pain Score: 0-No pain Agitated Behavior Scale: TBI Observation Details Observation Environment: CIR Start of observation period - Date: 01/02/22 Start of observation period - Time: 0820 End of observation period - Date: 01/02/22 End of observation period - Time: 0915 Agitated Behavior Scale (DO NOT LEAVE BLANKS) Short attention span, easy distractibility, inability to concentrate: Present to a slight degree Impulsive, impatient, low tolerance for pain or frustration: Absent Uncooperative, resistant to care, demanding: Absent Violent and/or threatening violence toward people or property: Absent Explosive and/or unpredictable anger: Absent Rocking, rubbing, moaning, or other self-stimulating behavior: Absent Pulling at tubes, restraints, etc.: Absent Wandering from treatment areas: Absent Restlessness, pacing, excessive movement: Absent Repetitive behaviors, motor, and/or verbal: Absent Rapid, loud, or excessive talking: Absent Sudden changes of mood: Absent Easily initiated or excessive crying and/or laughter: Absent Self-abusiveness, physical and/or verbal: Absent Agitated behavior scale total score: 15   Therapy/Group: Individual Therapy  Valma Cava 01/02/2022, 9:33 AM

## 2022-01-02 NOTE — Patient Care Conference (Addendum)
To Inpatient RehabilitationTeam Conference and Plan of Care Update Date: 01/02/2022   Time: 10:47 AM    Patient Name: Brandon [name]      Medical Record Number: [id number]  Date of Birth: Sep 21, 1945 Sex: Male         Room/Bed: 4W08C/4W08C-01 Payor Info: Payor: [employer] MEDICARE / Plan: [place] HMO / Product Type: *No Product type* /    Admit Date/Time:  12/30/2021  1:11 PM  Primary Diagnosis:  Traumatic brain injury with loss of consciousness [facility])  Hospital Problems: Principal Problem:   Traumatic brain injury with loss of consciousness [facility])    Expected Discharge Date: Expected Discharge Date: 01/23/22  Team Members Present: Physician leading conference: Other (comment) (Dr Durel Salts) Social Worker Present: Loralee Pacas, [place] Nurse Present: Other (comment) Tacy Learn, RN) PT Present: Tereasa Coop, PT OT Present: Cherylynn Ridges, OT SLP Present: Weston Anna, SLP PPS Coordinator present : Ileana Ladd, PT     Current Status/Progress Goal Weekly Team Focus  Bowel/Bladder   continent/incontinent of B&B   To return to baseline   Timed toileting every 2/3 hours and PRN   Swallow/Nutrition/ Hydration   Regular         ADL's   Max A overall for BADL tasks, fatigues quickly, waxing and waning alertnes  Supervision/min A  sel-care retraining, cognitive retraining, activity tolerance, sit<>stands, transfers   Mobility   fluctuating levels of assistance, modA to heavy maxA sit to stand, transfers, and gait x15' with RW  Supervision to [facility] for gait  activity tolerance, transfers, gait training, balance   Communication   Min A  Supervision  initiation of verbal expression   Safety/Cognition/ Behavioral Observations  Rancho Level VI-Max A  Min A  arousal, attention, initiation, functional problem solving   Pain   No Pain  Remain free of pain   Assess every shift, before therapy and PRN  Skin   no skin issues  remain free from sin issues   Assess every  shift and PRN    Discharge Planning:  D/c to home with support from wife, and their son who lives in the home. SW will confirm no barriers to discharge.   Team Discussion: TBI with LOC. Incontinent/continent of B/B. Blood noted in urine. Increased confusion with lethargy.U/A pending. LBM 21st (smear). Scheduled Tylenol for increase headache and knee pain. Incision to head are CDI w/o drainage. Patient HOH even with hearing aids. Patient on target to meet rehab goals: Currently max/total assist. Progress limited by fatigue, missed therapies, and family/patient behaviors.   *See Care Plan and progress notes for long and short-term goals.   Revisions to Treatment Plan:  Medication adjustments, U/A ordered, labs  Teaching Needs: Medications, safety, gait/transfer training, skin/wound care, etc.  Current Barriers to Discharge: Decreased caregiver support, Incontinence, Wound care, Behavior, and HOH with hearing aids  Possible Resolutions to Barriers: Family education, medication education, skin/wound care, order recommended DME.     Medical Summary Current Status: medically stable  Barriers to Discharge: Behavior;Home enviroment access/layout;Incontinence;Medical stability;Nutrition means;Wound care  Barriers to Discharge Comments: Cognitive status/delirium, urinary incontinence, poor balance, headaches Possible Resolutions to Barriers/Weekly Focus: Medication and lab monitorring for contributors to delirium, pain control   Continued Need for Acute Rehabilitation Level of Care: The patient requires daily medical management by a physician with specialized training in physical medicine and rehabilitation for the following reasons: Direction of a multidisciplinary physical rehabilitation program to maximize functional independence : Yes Medical management of patient stability for increased  activity during participation in an intensive rehabilitation regime.: Yes Analysis of laboratory values  and/or radiology reports with any subsequent need for medication adjustment and/or medical intervention. : Yes   I attest that I was present, lead the team conference, and concur with the assessment and plan of the team.   Ernest Pine 01/02/2022, 3:06 PM

## 2022-01-02 NOTE — Progress Notes (Signed)
Patient ID: QUINT CHESTNUT, male   DOB: [birth year], 76 y.o.   MRN: [id number]  NO family in room at time of visit.   [id number] left message for pt wife to provide updates from team conference and d/c date 9/12.  Loralee Pacas, MSW, [place] Office: [phone] Cell: [phone] Fax: [phone]

## 2022-01-02 NOTE — Progress Notes (Signed)
Called Dan A. PA regarding the problem with trying to obtain a urine specimen without doing an in and out cath. He said to clean patient and apply a condom cath then take first catch.

## 2022-01-02 NOTE — Plan of Care (Signed)
Behavioral Plan   Rancho Level: VI  Behavior to decrease/ eliminate:  -Lethargy -Restlessness  Changes to environment:  -Lights on, blinds open during the day; off and closed at night -Out of bed as much as possible -keep long neck urinal at bedside   Interventions: -Pants off in the bed, and brief detached so that pt has quick access to urinal. -Bed alarm, chair alarm -DO NOT LEAVE ALONE. Nurses station if family is not in the room  Recommendations for interactions with patient: -Make sure pt has hearing aids in and try to speak in lower tone. -Encourage verbal expression -Offer urinal   Attendees:  Weston Anna MA, CCC-SLP, CBIS Tereasa Coop, DPT, CBIS [place] Camiah Humm, OTR/L, CBIS

## 2022-01-02 NOTE — Progress Notes (Signed)
Physical Therapy Session Note  Patient Details  Name: Brandon [name] MRN: [id number] Date of Birth: [birth year]  Today's Date: 01/02/2022 PT Individual Time: 7001-7494 PT Individual Time Calculation (min): 57 min   Short Term Goals: Week 1:  PT Short Term Goal 1 (Week 1): Pt will transfer sup to sit w/ min A. PT Short Term Goal 2 (Week 1): Pt will transfer sit to stand w/ min A PT Short Term Goal 3 (Week 1): Pt will amb x 50' w/ RW and min A PT Short Term Goal 4 (Week 1): Pt will assess stairs.  Skilled Therapeutic Interventions/Progress Updates:     1st Session: Pt refuses therapy, saying he wants to go home. PT explains that going home is not an option at this time. Pt continues to refuse therapy. Pt misses 45 minutes of skilled PT.  2nd Session: Pt received seated in WC. Attempts to refuse therapy. PT educates pt on importance of participation, and that pt is not functionally capable of going home at this time. Pt eventually is agreeable. No complaint of pain. WC transport to gym for time management and energy conservation. Pt participates in transfer training in parallel bars with mirror for visual feedback. PT provides cues for hand placement, body mechanics, and anterior weight shift. Pt performs sit to stand in parallel bars with CGA to modA for initial 75% of full stand, but has difficulty extending knees and hips fully. Pt requires blocking of both knees and maxA to stand fully upright, but continues to have posterior bias and is unable to hold static standing without strong mod to maxA to facilitate anterior weight shift. Pt maintains standing ~15-30 seconds at a time prior to requiring rest breaks. Each seated rest break requires extended time due to fatigue. Pt attempts ambulation at on epoint and is able to take x1 very short step with R leg, but unable to clear opposite foot from ground. WC transport back to room. Pt performs squat pivot to bed with maxA +2 and cues for hand placement  and sequencing. Sit to supine with modA +2 and cues for logrolling. Left supine with alarm intact and all needs within reach.  Therapy Documentation Precautions:  Precautions Precautions: Fall Restrictions Weight Bearing Restrictions: No    Therapy/Group: Individual Therapy  Breck Coons, PT, DPT 01/02/2022, 4:31 PM

## 2022-01-02 NOTE — Progress Notes (Addendum)
PROGRESS NOTE   Subjective/Complaints:  Pt seen and evaluated at bedside with wife.  Reports he is doing well, continued to wake up due to some incontinence overnight but wife notes he could feel the sensation to go and "I would wake up with him trying to mess with his bottoms", but could not hold it long enough to use the urinal. He overall did sleep better, and this AM has been assisting more getting up top bedside.  Pt denies current headache of knee pain. Therapy does note HA limiting participation somewhat; family agreeable to scheduling tylenol.   Pt remains intermittently confused, reinforced frequent reorientation with family.   ROS: Patient denies fever, rash, sore throat, blurred vision, dizziness, nausea, vomiting, diarrhea, cough, shortness of breath or chest pain   Objective:  Recent Labs    01/01/22 0608 01/02/22 0540  WBC 8.9 7.4  HGB 12.4* 13.1  HCT 36.0* 37.6*  PLT 181 199    Recent Labs    01/01/22 0608  NA 137  K 3.7  CL 103  CO2 29  GLUCOSE 149*  BUN 19  CREATININE 0.96  CALCIUM 8.6*     Intake/Output Summary (Last 24 hours) at 01/02/2022 0948 Last data filed at 01/02/2022 0843 Gross per 24 hour  Intake 415 ml  Output --  Net 415 ml         Physical Exam: Vital Signs Blood pressure 126/83, pulse 85, temperature 98.7 F (37.1 C), temperature source Oral, resp. rate 20, height 6' 6.5" (1.994 m), SpO2 93 %.  General: Alert and oriented to self, time, and reason for admission; not oriented to place, with multiple attempts and cueing states he is "at your home".  HEENT: ERRLA, EOMI, sclera anicteric, oral mucosa pink and moist, dentition intact, ext ear canals clear. + HOH, b/l hearing aides.  Neck: Supple without JVD or lymphadenopathy Heart: Reg rate and rhythm. No murmurs rubs or gallops Chest: CTA bilaterally without wheezes, rales, or rhonchi; no distress Abdomen: Soft,  non-tender, non-distended, bowel sounds positive. Extremities: No clubbing, cyanosis, or edema. Pulses are 2+ Psych: Pt's affect is appropriate. Pt is cooperative Skin: crani incisions cdi with staples intact.  Neuro: Mild recall delay, exacerbated by [facility] Naming intact for 2/3 objects. Reads clock correctly. CN 2-12 intact.  Musculoskeletal:  No effusion or obvious deformity. Moves all 4 extremities antigravity.    Assessment/Plan: 1. Functional deficits which require 3+ hours per day of interdisciplinary therapy in a comprehensive inpatient rehab setting. Physiatrist is providing close team supervision and 24 hour management of active medical problems listed below. Physiatrist and rehab team continue to assess barriers to discharge/monitor patient progress toward functional and medical goals  Care Tool:  Bathing    Body parts bathed by patient: Face, Front perineal area   Body parts bathed by helper: Right arm, Left arm, Chest, Abdomen, Buttocks, Right upper leg, Left upper leg, Right lower leg, Left lower leg     Bathing assist Assist Level: Maximal Assistance - Patient 24 - 49%     Upper Body Dressing/Undressing Upper body dressing   What is the patient wearing?: Pull over shirt    Upper body assist Assist Level:  Maximal Assistance - Patient 25 - 49%    Lower Body Dressing/Undressing Lower body dressing      What is the patient wearing?: Pants, Incontinence brief     Lower body assist Assist for lower body dressing: 2 Helpers     Toileting Toileting    Toileting assist Assist for toileting: 2 Helpers     Transfers Chair/bed transfer  Transfers assist  Chair/bed transfer activity did not occur: Safety/medical concerns  Chair/bed transfer assist level: Dependent - mechanical lift     Locomotion Ambulation   Ambulation assist      Assist level: Moderate Assistance - Patient 50 - 74% Assistive device: Walker-rolling Max distance: 15   Walk 10 feet  activity   Assist     Assist level: Moderate Assistance - Patient - 50 - 74%     Walk 50 feet activity   Assist Walk 50 feet with 2 turns activity did not occur: Safety/medical concerns         Walk 150 feet activity   Assist Walk 150 feet activity did not occur: Safety/medical concerns         Walk 10 feet on uneven surface  activity   Assist Walk 10 feet on uneven surfaces activity did not occur: Safety/medical concerns         Wheelchair     Assist Is the patient using a wheelchair?: Yes (per PT LTGs, pt to propel w/c at DC; note plan for w/c propulsion) Type of Wheelchair: Manual Wheelchair activity did not occur: Safety/medical concerns         Wheelchair 50 feet with 2 turns activity    Assist    Wheelchair 50 feet with 2 turns activity did not occur: Safety/medical concerns       Wheelchair 150 feet activity     Assist  Wheelchair 150 feet activity did not occur: Safety/medical concerns       Blood pressure 126/83, pulse 85, temperature 98.7 F (37.1 C), temperature source Oral, resp. rate 20, height 6' 6.5" (1.994 m), SpO2 93 %.  Medical Problem List and Plan: 1. Functional deficits secondary to bilateral SDH's after fall s/p bilateral burr hole evacuation 8/14             -patient may shower             -ELOS/Goals: 12-14 days, supervision to mod I goals with PT, OT, SLP  -CIR therapies including PT, OT, and SLP  2.  Antithrombotics: -DVT/anticoagulation:  Mechanical:  Antiembolism stockings, knee (TED hose) Bilateral lower extremities Sequential compression devices, below knee Bilateral lower extremities             -antiplatelet therapy: n/a 3. Pain Management/headache: t             - tylenol for mild pain, hydrocodone for more severe pain; will convert to Tylenol 1000 mg TID scheduled and DC hydrocodone 8/22             -consider topamax trial if needed but headaches appear relatively mild at this point 4.  Mood/Behavior/Sleep: trazodone prn for sleep             -pt seems fairly up beat and wife is very supportive             -antipsychotic agents: n/a 5. Neuropsych/cognition: This patient is not quite capable of making decisions on his own behalf. 6. Skin/Wound Care: local care to scalp, remove staples later this week  7. Fluids/Electrolytes/Nutrition: encourage appropriate  po intake             -liberalized him to regular diet to help with po intake. Encouraged wife to bring food from home as well. Poor po intake so far however.              -check labs Monday - stable 8. Seizure prophylaxis: keppra '500mg'$  q 12 hours  9. Hx of Prostate Cancer, c/b urinary incontinence             -flomax 0.'4mg'$  bid, proscar '5mg'$  daily              -emptying with intermittent incontinence thus far              - UA 8/21 for blood clots, urinary urgency, and increased confusion - pending              - After reviewing OP notes, was prescribed flomax 0.4 mg daily and was taking BID as OP. If no improvement in incontinence with other measures, will reduce back to daily.  10. HTN:              -hctz/lisinopril daily per home regimen             -monitor bp as he mobilizes more with therapy  -  bp controlled 11. Osteoarthritis bilateral knees, R>L             -voltaren gel to both knees - improved pain             -observe knees with PT, might benefit from some type of bracing             -his knee pain certainly was contributing to his recent falls and balance deficits in the months prior to this admit  12. Delirium, likely multifactorial etiology (post-hospitalization, polypharmacy) - Ensure wearing hearing aides; wife has microphone available to amplify  - UA as above 8/21 pending; CBC 8/21-22 stable, no leukocytosis - Delirium precautions added and discussed with family - Will closely observe for objective changes in neurologic status, low threshold for repeat CT head - Keppra may be contributing to ongoing  confusion; given craniotomy 8/14, inquired with NSGY Dr. Annette Stable if appropriate to DC 8/23 if for seizure ppx.   LOS: 3 days A FACE TO FACE EVALUATION WAS PERFORMED  Est Dispo: Tuesday, Sept 12th to home  Gertie Gowda 01/02/2022, 9:48 AM

## 2022-01-03 LAB — URINALYSIS, ROUTINE W REFLEX MICROSCOPIC
Bilirubin Urine: NEGATIVE
Glucose, UA: NEGATIVE mg/dL
Hgb urine dipstick: NEGATIVE
Ketones, ur: NEGATIVE mg/dL
Leukocytes,Ua: NEGATIVE
Nitrite: NEGATIVE
Protein, ur: NEGATIVE mg/dL
Specific Gravity, Urine: 1.023 (ref 1.005–1.030)
pH: 5 (ref 5.0–8.0)

## 2022-01-03 NOTE — Progress Notes (Signed)
PROGRESS NOTE   Subjective/Complaints:  Pt seen and evaluated at bedside with wife.  Patient remains confused with difficulty in maintaining attention long enough to answer questions this AM. Wife states he has been "better in the afternoons" recently regarding his cognition. Patient continues to insist he is "at her [points to nurse] house", and does not re-orient with cues or options.   Wife denies any complaints of headaches since changing to scheduled tylenol. Discussed DC of Keppra this AM, as approved by neurosurgery, to assist in cognitive recovery. Family agreeable.    ROS: Patient denies fever, rash, sore throat, blurred vision, dizziness, nausea, vomiting, diarrhea, cough, shortness of breath or chest pain   Objective:  Recent Labs    01/01/22 0608 01/02/22 0540  WBC 8.9 7.4  HGB 12.4* 13.1  HCT 36.0* 37.6*  PLT 181 199    Recent Labs    01/01/22 0608  NA 137  K 3.7  CL 103  CO2 29  GLUCOSE 149*  BUN 19  CREATININE 0.96  CALCIUM 8.6*     Intake/Output Summary (Last 24 hours) at 01/03/2022 0827 Last data filed at 01/02/2022 1244 Gross per 24 hour  Intake 354 ml  Output --  Net 354 ml         Physical Exam: Vital Signs Blood pressure (!) 141/87, pulse 84, temperature 98.3 F (36.8 C), temperature source Oral, resp. rate 18, height 6' 6.5" (1.994 m), SpO2 96 %.  General: Alert and oriented to self, time, not oriented to place, with multiple attempts and cueing states he is "at her [nurse's] home".  HEENT: ERRLA, EOMI, sclera anicteric, oral mucosa pink and moist, dentition intact, ext ear canals clear. + HOH, b/l hearing aides.  Neck: Supple without JVD or lymphadenopathy Heart: Reg rate and rhythm. No murmurs rubs or gallops Chest: CTA bilaterally without wheezes, rales, or rhonchi; no distress Abdomen: Soft, non-tender, non-distended, bowel sounds positive. Extremities: No clubbing,  cyanosis, or edema. Pulses are 2+ Psych: Pt's affect is appropriate. Pt is cooperative Skin: crani incisions cdi with staples intact.  Neuro: Significant difficulty with attention today, naming 1/3 objects with significant reinforcement and cueing. Ongoing recall delay, exacerbated by [facility]. CN 2-12 intact.  Musculoskeletal:  No effusion or obvious deformity. Moves all 4 extremities antigravity.    Assessment/Plan: 1. Functional deficits which require 3+ hours per day of interdisciplinary therapy in a comprehensive inpatient rehab setting. Physiatrist is providing close team supervision and 24 hour management of active medical problems listed below. Physiatrist and rehab team continue to assess barriers to discharge/monitor patient progress toward functional and medical goals  Care Tool:  Bathing    Body parts bathed by patient: Face, Front perineal area   Body parts bathed by helper: Right arm, Left arm, Chest, Abdomen, Buttocks, Right upper leg, Left upper leg, Right lower leg, Left lower leg     Bathing assist Assist Level: Maximal Assistance - Patient 24 - 49%     Upper Body Dressing/Undressing Upper body dressing   What is the patient wearing?: Pull over shirt    Upper body assist Assist Level: Maximal Assistance - Patient 25 - 49%    Lower Body Dressing/Undressing  Lower body dressing      What is the patient wearing?: Pants, Incontinence brief     Lower body assist Assist for lower body dressing: 2 Helpers     Toileting Toileting    Toileting assist Assist for toileting: 2 Helpers     Transfers Chair/bed transfer  Transfers assist  Chair/bed transfer activity did not occur: Safety/medical concerns  Chair/bed transfer assist level: Dependent - mechanical lift     Locomotion Ambulation   Ambulation assist      Assist level: Moderate Assistance - Patient 50 - 74% Assistive device: Walker-rolling Max distance: 15   Walk 10 feet activity   Assist      Assist level: Moderate Assistance - Patient - 50 - 74%     Walk 50 feet activity   Assist Walk 50 feet with 2 turns activity did not occur: Safety/medical concerns         Walk 150 feet activity   Assist Walk 150 feet activity did not occur: Safety/medical concerns         Walk 10 feet on uneven surface  activity   Assist Walk 10 feet on uneven surfaces activity did not occur: Safety/medical concerns         Wheelchair     Assist Is the patient using a wheelchair?: Yes (per PT LTGs, pt to propel w/c at DC; note plan for w/c propulsion) Type of Wheelchair: Manual Wheelchair activity did not occur: Safety/medical concerns         Wheelchair 50 feet with 2 turns activity    Assist    Wheelchair 50 feet with 2 turns activity did not occur: Safety/medical concerns       Wheelchair 150 feet activity     Assist  Wheelchair 150 feet activity did not occur: Safety/medical concerns       Blood pressure (!) 141/87, pulse 84, temperature 98.3 F (36.8 C), temperature source Oral, resp. rate 18, height 6' 6.5" (1.994 m), SpO2 96 %.  Medical Problem List and Plan: 1. Functional deficits secondary to bilateral SDH's after fall s/p bilateral burr hole evacuation 8/14             -patient may shower             -ELOS/Goals: 12-14 days, supervision to mod I goals with PT, OT, SLP  -CIR therapies including PT, OT, and SLP  2.  Antithrombotics: -DVT/anticoagulation:  Mechanical:  Antiembolism stockings, knee (TED hose) Bilateral lower extremities Sequential compression devices, below knee Bilateral lower extremities             -antiplatelet therapy: n/a 3. Pain Management/headache: t             - tylenol for mild pain, hydrocodone for more -> Tylenol 1000 mg TID scheduled and DC hydrocodone 8/22 - HA improved             -consider topamax trial if needed but headaches appear relatively mild at this point 4. Mood/Behavior/Sleep: trazodone prn for  sleep             -pt seems fairly up beat and wife is very supportive             -antipsychotic agents: n/a 5. Neuropsych/cognition: This patient is not quite capable of making decisions on his own behalf. 6. Skin/Wound Care: local care to scalp, remove staples later this week  7. Fluids/Electrolytes/Nutrition: encourage appropriate po intake             -  liberalized him to regular diet to help with po intake. Encouraged wife to bring food from home as well. Poor po intake so far however.              -check labs Monday - stable 8. Seizure prophylaxis: keppra '500mg'$  q 12 hours -> DC 8/23 9. Hx of Prostate Cancer, c/b urinary incontinence             -flomax 0.'4mg'$  bid, proscar '5mg'$  daily              -emptying with intermittent incontinence thus far              - UA 8/21 for blood clots, urinary urgency, and increased confusion - pending - obtained this AM             - After reviewing OP notes, was prescribed flomax 0.4 mg daily and was taking BID as OP. If no improvement in incontinence with other measures, will consider reduce back to daily.  10. HTN:              -hctz/lisinopril daily per home regimen             -monitor bp as he mobilizes more with therapy  -  bp controlled 11. Osteoarthritis bilateral knees, R>L             -voltaren gel to both knees - improved pain             -observe knees with PT, might benefit from some type of bracing             -his knee pain certainly was contributing to his recent falls and balance deficits in the months prior to this admit  12. Delirium, likely multifactorial etiology (post-hospitalization, polypharmacy) - Ensure wearing hearing aides; wife has microphone available to amplify  - UA as above 8/21 pending; CBC 8/21-22 stable, no leukocytosis - Delirium precautions added and discussed with family - Will closely observe for objective changes in neurologic status, low threshold for repeat CT head - Keppra may be contributing to ongoing  confusion; given craniotomy 8/14, inquired with NSGY Dr. Annette Stable if appropriate to DC 8/23 if for seizure ppx-> NSGY agreeable, DC 8/23  - If no improvement w/ medication change or obvious infectious source, will consider repeat CT head   LOS: 4 days A FACE TO FACE EVALUATION WAS PERFORMED  Est Dispo: Tuesday, Sept 12th to home  Gertie Gowda 01/03/2022, 8:27 AM

## 2022-01-03 NOTE — Progress Notes (Signed)
Candom cath was applied, but still wasn't able to collect the urine due to patient keeps removing candom cath. Morning nurse was made aware.

## 2022-01-03 NOTE — Progress Notes (Signed)
Occupational Therapy TBI Note  Patient Details  Name: Brandon [name] MRN: [id number] Date of Birth: August 05, 1945  Today's Date: 01/03/2022 OT Individual Time: 0768-0881 OT Individual Time Calculation (min): 60 min  and Today's Date: 01/03/2022 OT Missed Time: 15 Minutes Missed Time Reason: Patient fatigue   Short Term Goals: Week 1:  OT Short Term Goal 1 (Week 1): Pt will demonstrate improved sitting balance and activity tolerance while completing self care tasks seated on EOB with Min A to maintain balance OT Short Term Goal 2 (Week 1): Pt will complete sit to stand with RW with Mod Assist x1. OT Short Term Goal 3 (Week 1): Pt will increase UB dressing to Min Assist OT Short Term Goal 4 (Week 1): Pt will increase toilet transfer to Mod A x1 using RW  Skilled Therapeutic Interventions/Progress Updates:    Pt received supine with no c/o pain, agreeable to OT session. He was very lethargic initially but perked up with mobility. He was very HOH and required very close high volume instructions. He required max cueing to sequence donning pants and coming to EOB. Max A overall for bed mobility (mostly d/t poor initiation). He sat EOB with close (S) initially once stabilized, however then started to return to supine as he fatigued. He stood from EOB with mod A +2, great initiation but then poor terminal hip extension. Shorts, teds, and socks donned with total A. He completed a sit > stand transfer in the stedy with mod A overall. He was transferred to the TIS w/c via stedy. He was set up to eat breakfast. Poor focused attention to task. He was very resistant to eating breakfast but was agreeable to an Ensure drink. RN confirmed pt could have. He did consume 75% of the ensure via straw. He completed grooming tasks at the sink with min cueing for initiation but good motor planning. He requested to get back into bed and was perseverative on this. He completed a stand pivot x2 attempts with mod A of 1. He  required cueing and facilitation for hand placement and trunk elevation. He was left supine with all needs met, bed alarm set. 15 min missed 2/2 fatigue.    Therapy Documentation Precautions:  Precautions Precautions: Fall Restrictions Weight Bearing Restrictions: No  Therapy/Group: Individual Therapy  Curtis Sites 01/03/2022, 6:27 AM

## 2022-01-03 NOTE — IPOC Note (Signed)
Overall Plan of Care [facility]) Patient Details Name: Brandon [name] MRN: [id number] DOB: 1945-07-22  Admitting Diagnosis: Traumatic brain injury with loss of consciousness [facility])  Hospital Problems: Principal Problem:   Traumatic brain injury with loss of consciousness [facility])     Functional Problem List: Nursing Bladder, Bowel, Medication Management, Nutrition, Safety, Perception, Sensory, Skin Integrity  PT Balance, Safety, Endurance, Motor  OT Balance, Motor, Endurance, Perception, Safety, Cognition  SLP Cognition, Linguistic  TR         Basic ADL's: OT Eating, Grooming, Bathing, Dressing, Toileting     Advanced  ADL's: OT       Transfers: PT Bed Mobility, Bed to Chair, Car, [employer], Futures trader, Metallurgist: PT Ambulation, Emergency planning/management officer, Stairs     Additional Impairments: OT    SLP Communication, Social Cognition expression Problem Solving, Memory, Attention  TR      Anticipated Outcomes Item Anticipated Outcome  Self Feeding    Swallowing      Basic self-care  SBA-Min A  Toileting  Min A   Bathroom Transfers Min A  Bowel/Bladder  continent of bowel and bladder  Transfers  supervision  Locomotion  CGA w/ least restrictive AD  Communication  supervision  Cognition  min assist  Pain  Remains a pain goal of <3/10  Safety/Judgment  Patient will call staff appropriately for ADL's each shift   Therapy Plan: PT Intensity: Minimum of 1-2 x/day ,45 to 90 minutes PT Frequency: 5 out of 7 days PT Duration Estimated Length of Stay: 2-3 weeks OT Intensity: Minimum of 1-2 x/day, 45 to 90 minutes OT Frequency: 5 out of 7 days OT Duration/Estimated Length of Stay: 2-3 weeks SLP Intensity: Minumum of 1-2 x/day, 30 to 90 minutes SLP Frequency: 3 to 5 out of 7 days SLP Duration/Estimated Length of Stay: 14-21 days   Team Interventions: Nursing Interventions Bladder Management, Bowel Management, Skin Care/Wound Management, Medication  Management, Discharge Planning, Psychosocial Support, Patient/Family Education  PT interventions Ambulation/gait training, Discharge planning, Functional mobility training, Therapeutic Activities, UE/LE Strength taining/ROM, Wheelchair propulsion/positioning, UE/LE Coordination activities, Therapeutic Exercise, Stair training, Patient/family education, Neuromuscular re-education, Community reintegration  OT Interventions Training and development officer, Neuromuscular re-education, Self Care/advanced ADL retraining, Therapeutic Exercise, Wheelchair propulsion/positioning, UE/LE Strength taining/ROM, DME/adaptive equipment instruction, Cognitive remediation/compensation, Community reintegration, Discharge planning, Functional mobility training, Therapeutic Activities, UE/LE Coordination activities, Patient/family education  SLP Interventions Cognitive remediation/compensation, Cueing hierarchy, Speech/Language facilitation, Functional tasks, Internal/external aids, Patient/family education, Dysphagia/aspiration precaution training  TR Interventions    SW/CM Interventions Discharge Planning, Psychosocial Support, Patient/Family Education   Barriers to Discharge MD  Medical stability  Nursing Incontinence, Wound Care, Nutrition means, Medication compliance    PT Inaccessible home environment, Lack of/limited family support 1-2 STE, spouse unable to assist pt, right lateral lean.  OT      SLP      SW Decreased caregiver support, Lack of/limited family support, Insurance for SNF coverage     Team Discharge Planning: Destination: PT-Home ,OT- Home , SLP-Home Projected Follow-up: PT-Home health PT, OT-  Home health OT, SLP-Home Health SLP Projected Equipment Needs: PT-To be determined, OT- 3 in 1 bedside comode, Tub/shower seat, To be determined, SLP-None recommended by SLP Equipment Details: PT-pt has SPC only, OT-  Patient/family involved in discharge planning: PT- Family member/caregiver, Patient,   OT-Family member/caregiver, SLP-Patient, Family member/caregiver  MD ELOS: 14-21 days Medical Rehab Prognosis:  Excellent Assessment: The patient has been admitted for CIR therapies with the diagnosis of  TBI. The team will be addressing functional mobility, strength, stamina, balance, safety, adaptive techniques and equipment, self-care, bowel and bladder mgt, patient and caregiver education, NMR, cognition, speech, swallowing. Goals have been set at min assist with for mobility, self-care and sup/min for cognition/communication. Anticipated discharge destination is home with wife.        See Team Conference Notes for weekly updates to the plan of care

## 2022-01-03 NOTE — Progress Notes (Signed)
Speech Language Pathology TBI Note  Patient Details  Name: Brandon [name] MRN: [id number] Date of Birth: 04-May-1946  Today's Date: 01/03/2022 SLP Individual Time: 0932-1030 SLP Individual Time Calculation (min): 58 min  Short Term Goals: Week 1: SLP Short Term Goal 1 (Week 1): Pt will sustain his attention to basic, familiar tasks for 3-5 minute intervals wtih mod cues for redirection. SLP Short Term Goal 2 (Week 1): Pt will utilize external aids to recall daily information with mod cues. SLP Short Term Goal 3 (Week 1): Pt will initiate functional communication with mod assist. SLP Short Term Goal 4 (Week 1): Pt will utilize an increased vocal intensity to achieve intelligibility at the sentence level with mod assist.  Skilled Therapeutic Interventions: Skilled treatment session focused on cognitive goals. Upon arrival, patient was asleep in bed and roused with extra time. SLP facilitated session by transferring the patient to the wheelchair via the stedy with +2 assist for safety. SLP facilitated session by providing extra time and Max A multimodal cues for sustained attention for ~2 minutes, organization and functional problem solving during a basic money management task. Patient with poor awareness regarding difficulty of task. Patient continues to be hard of hearing despite hearing aids, therefore, SLP utilized written communication as well to ensure comprehension. Patient left upright in tilt-in-space wheelchair with alarm on and wife present. Continue with current plan of care.      Pain No/Denies Pain   Agitated Behavior Scale: TBI Observation Details Observation Environment: CIR Start of observation period - Date: 01/03/22 Start of observation period - Time: 0932 End of observation period - Date: 01/03/22 End of observation period - Time: 1030 Agitated Behavior Scale (DO NOT LEAVE BLANKS) Short attention span, easy distractibility, inability to concentrate: Present to a slight  degree Impulsive, impatient, low tolerance for pain or frustration: Absent Uncooperative, resistant to care, demanding: Absent Violent and/or threatening violence toward people or property: Absent Explosive and/or unpredictable anger: Absent Rocking, rubbing, moaning, or other self-stimulating behavior: Absent Pulling at tubes, restraints, etc.: Absent Wandering from treatment areas: Absent Restlessness, pacing, excessive movement: Absent Repetitive behaviors, motor, and/or verbal: Absent Rapid, loud, or excessive talking: Absent Sudden changes of mood: Absent Easily initiated or excessive crying and/or laughter: Absent Self-abusiveness, physical and/or verbal: Absent Agitated behavior scale total score: 15  Therapy/Group: Individual Therapy  Tranae Laramie 01/03/2022, 12:02 PM

## 2022-01-03 NOTE — Progress Notes (Signed)
Physical Therapy Session Note  Patient Details  Name: Brandon [name] MRN: [id number] Date of Birth: Apr 19, 1946  Today's Date: 01/03/2022 PT Individual Time: 1302-1414 PT Individual Time Calculation (min): 72 min   Short Term Goals: Week 1:  PT Short Term Goal 1 (Week 1): Pt will transfer sup to sit w/ min A. PT Short Term Goal 2 (Week 1): Pt will transfer sit to stand w/ min A PT Short Term Goal 3 (Week 1): Pt will amb x 50' w/ RW and min A PT Short Term Goal 4 (Week 1): Pt will assess stairs.  Skilled Therapeutic Interventions/Progress Updates:     Pt received supine in bed and agrees to therapy. Supine to sit with cues for logrolling and body mechanics, with modA required overall. Pt performs sit to stand with Stedy and modA +2, with cues for hand placement and body mechanics. Transfer to tilt in space WC with totalA with Stedy. WC transport to gym for time management. Pt performs gait training with EVA walker to promote upright posture with hip and trunk extension. Sit to stand to EVA walker with cues for hand placement and sequencing, requiring modA. Pt ambulates bouts of 10', 14', 20', 27', 24', and 12' with extended seated rest breaks between each bout. Pt ambulates with knees in flexed position throughout gait cycle, and is able to partially correct with cueing but unable to fully extend knees. Pt requires minA/modA +2 and +3 for WC follow for safety.   Pt stands with EVA walker in front of mirror for visual feedback to attempt to achieve improved upright posture. Pt has slight improvement in hip and knee extension but appears very fatigued and is unable to achieve full upright posture.  Pt performs transfer back to bed with Stedy and +2 assist, but only requiring miNA+2 for sit to stand. ModA +2 for sit to supine. Left with alarm intact and all needs within reach.  Therapy Documentation Precautions:  Precautions Precautions: Fall Restrictions Weight Bearing Restrictions:  No    Therapy/Group: Individual Therapy  Breck Coons, PT, DPT 01/03/2022, 5:15 PM

## 2022-01-04 ENCOUNTER — Inpatient Hospital Stay (HOSPITAL_COMMUNITY): Payer: Medicare HMO

## 2022-01-04 MED ORDER — ORAL CARE MOUTH RINSE: 15.0000 mL | OROMUCOSAL | Status: DC | PRN

## 2022-01-04 NOTE — Progress Notes (Signed)
Physical Therapy Session Note  Patient Details  Name: ANDRON MARRAZZO MRN: [id number] Date of Birth: 04-May-1946  Today's Date: 01/03/2022   Short Term Goals: Week 1:  PT Short Term Goal 1 (Week 1): Pt will transfer sup to sit w/ min A. PT Short Term Goal 2 (Week 1): Pt will transfer sit to stand w/ min A PT Short Term Goal 3 (Week 1): Pt will amb x 50' w/ RW and min A PT Short Term Goal 4 (Week 1): Pt will assess stairs.   Skilled Therapeutic Interventions/Progress Updates:   Pt received supine in bed. Wife at bedside reporting that he had just gotten into bed and had only been sleeping for few minutes. Requested to allow pt to sleep until afternoon PT session. Left in bed with call bell in reach. Will re-attempt Pt session at later time/date.       Therapy Documentation Precautions:  Precautions Precautions: Fall Restrictions Weight Bearing Restrictions: No General:  Missed time: 30 min    Therapy/Group: Individual Therapy  Lorie Phenix 01/04/2022, 5:05 AM

## 2022-01-04 NOTE — Progress Notes (Addendum)
Physical Therapy Session Note  Patient Details  Name: Brandon [name] MRN: [id number] Date of Birth: 12-Feb-1946  Today's Date: 01/04/2022 PT Individual Time: 6712-4580 and 1403-1430 PT Individual Time Calculation (min): 43 min and 27 min  Short Term Goals: Week 1:  PT Short Term Goal 1 (Week 1): Pt will transfer sup to sit w/ min A. PT Short Term Goal 2 (Week 1): Pt will transfer sit to stand w/ min A PT Short Term Goal 3 (Week 1): Pt will amb x 50' w/ RW and min A PT Short Term Goal 4 (Week 1): Pt will assess stairs.  Skilled Therapeutic Interventions/Progress Updates:     Pt received seated in [facility] and agrees to therapy. Daughter present and reports that pt had been wanting to go back to bed and is feeling very fatigued. Daughter requests to attend therapy session to hopefully ease some of pt's anxiety regarding leaving the room. WC transport to gym for time management. Pt performs sit to stand to RW with modA and cues for hand placement and body mechanics. Stand step transfer to mat with modA/maxA due to pt having difficulty managing RW and sequencing steps for transfer. From elevated mat, pt performs multiple reps of sit to stand with RW and cues for hand placement, initiation, sequencing, and anterior weight shift. ModA required overall and pt has difficulty standing fully upright, remaining slightly flexed in hips and knees, despite verbal and tactile cues to correct. X4 reps total of sit to stand with pt attempting to stand to fatigue, typically ~30 seconds. Between bouts, pt works on static sitting balance with bilateral lower extremities supported to give maximum BOS, and PT providing cues for hand placement to facilitate midline positioning. Pt has tendency for posterior bias and lateral lean to the L but is able to maintain sitting balance with close supervision. Pt performs x1 bout of ambulation with RW and modA +2, with +3 for WC follow. Pt ambulates x14' with forward flexed posture,  shuffling gait pattern, and knees significantly flexed throughout gait cycle.   Stedy transfer back to bed with modA +2. Left with alarm intact and all needs within reach.  2nd Session: Pt received seated in [facility] and agrees to therapy. Reports headache but number not provided. PT provides rest breaks as needed to manage pain. WC transport to gym for time management. Pt performs squat pivot to Nustep with maxA +2 and cues for hand placement, anterior trunk lean, initiation, and sequencing. Pt completes Nustep for strength and endurance training. Pt is able to complete x5:00 at workload of 4 with average steps per minute ~25. PT cues pt to complete full ROM and attempt rate >40 steps per minute. Pt reports knee pain just prior to nustep and PT applies voltarin gel for pain relief. RN aware. Squat pivot back to WC with maxA +2 and same cues. Stedy transfer back to bed with maxA +2 and pt having difficulty with motor planning of sit to stand. Sit to supine with modA +2. Left with alarm intact and all needs within reach.  Therapy Documentation Precautions:  Precautions Precautions: Fall Restrictions Weight Bearing Restrictions: No  Therapy/Group: Individual Therapy  Breck Coons, PT, DPT 01/04/2022, 5:03 PM

## 2022-01-04 NOTE — Progress Notes (Signed)
Speech Language Pathology TBI Note  Patient Details  Name: Brandon [name] MRN: [id number] Date of Birth: Sep 23, 1945  Today's Date: 01/04/2022 SLP Individual Time: 1300-1400 SLP Individual Time Calculation (min): 60 min  Short Term Goals: Week 1: SLP Short Term Goal 1 (Week 1): Pt will sustain his attention to basic, familiar tasks for 3-5 minute intervals wtih mod cues for redirection. SLP Short Term Goal 2 (Week 1): Pt will utilize external aids to recall daily information with mod cues. SLP Short Term Goal 3 (Week 1): Pt will initiate functional communication with mod assist. SLP Short Term Goal 4 (Week 1): Pt will utilize an increased vocal intensity to achieve intelligibility at the sentence level with mod assist.  Skilled Therapeutic Interventions: Skilled treatment session focused on cognitive-linguistic goals. Upon arrival, patient was awake in bed but had been incontinent of urine. SLP facilitated session by providing extra time and Min verbal and tactile cues for patient to initiate bed mobility during peri care. Patient also donned a new shirt with Min cues needed for appropriate orientation of clothing.  Patient was transferred to the wheelchair with +2 assist via the [place]. Patient was independently oriented to place and situation and demonstrated improvement in overall awareness of both cognitive-linguistic and physical deficits. Patient also appeared more engaged with improved initiation of verbal expression. During spontaneous verbalizations, patient with difficulty in word-finding with intermittent awareness noted. During a structured language task, patient named functional items with 100% accuracy with Min verbal cues and performed verbal reasoning tasks with overall Min verbal cues. Patient left upright in wheelchair with family present. Continue with current plan of care.      Pain No/Denies Pain   Agitated Behavior Scale: TBI Observation Details Observation Environment:  CIR Start of observation period - Date: 01/04/22 Start of observation period - Time: 1300 End of observation period - Date: 01/04/22 End of observation period - Time: 1400 Agitated Behavior Scale (DO NOT LEAVE BLANKS) Short attention span, easy distractibility, inability to concentrate: Present to a slight degree Impulsive, impatient, low tolerance for pain or frustration: Absent Uncooperative, resistant to care, demanding: Absent Violent and/or threatening violence toward people or property: Absent Explosive and/or unpredictable anger: Absent Rocking, rubbing, moaning, or other self-stimulating behavior: Absent Pulling at tubes, restraints, etc.: Absent Wandering from treatment areas: Absent Restlessness, pacing, excessive movement: Absent Repetitive behaviors, motor, and/or verbal: Absent Rapid, loud, or excessive talking: Absent Sudden changes of mood: Absent Easily initiated or excessive crying and/or laughter: Absent Self-abusiveness, physical and/or verbal: Absent Agitated behavior scale total score: 15  Therapy/Group: Individual Therapy  Arnika Larzelere 01/04/2022, 2:22 PM

## 2022-01-04 NOTE — Progress Notes (Addendum)
PROGRESS NOTE   Subjective/Complaints:  Pt seen and evaluated in room in tilt-in-space WC. Daughter at bedside reports he had multiple incontinent episodes and was very confused overnight, "like he wasn't hearing the conversation". Also notes he tried to get out of bed.  Patient endorses good sleep, feeling well today after a shower. When asked how he did in therapies yesterday, unable to remember most details. He does remember being in the [place] walker and "it made me tired".  Discussed with family and patient who are agreeable with repeat head CT today for limited therapy progression, ongoing delirium, and incontinence.   ROS: Patient denies fever, rash, sore throat, blurred vision, dizziness, nausea, vomiting, diarrhea, cough, shortness of breath or chest pain   Objective:  Recent Labs    01/02/22 0540  WBC 7.4  HGB 13.1  HCT 37.6*  PLT 199    No results for input(s): "NA", "K", "CL", "CO2", "GLUCOSE", "BUN", "CREATININE", "CALCIUM" in the last 72 hours.   Intake/Output Summary (Last 24 hours) at 01/04/2022 1134 Last data filed at 01/03/2022 1752 Gross per 24 hour  Intake 327 ml  Output --  Net 327 ml     Reviewed last CT head 8/17 as follows:  IMPRESSION: 1. Bilateral subdural drains remain in place. Unchanged mixed density 15 mm Right Side Subdural Hematoma. Additional regression of the smaller Left Subdural, now 4-5 mm. Small volume para falcine and tentorial SDH. Stable small volume SAH and trace IVH.  2. Mildly increased leftward midline shift, now 4-5 mm. Basilar cisterns remain patent. No ventriculomegaly.  3. No new intracranial abnormality.   Independently reviewed imaging and agree with above interpretation.      Physical Exam: Vital Signs Blood pressure 132/75, pulse 86, temperature 97.6 F (36.4 C), temperature source Oral, resp. rate 16, height 6' 6.5" (1.994 m), SpO2 97 %.  General: Alert and  oriented to self, time, place and event.  HEENT: ERRLA, EOMI, sclera anicteric, oral mucosa pink and moist, dentition intact, ext ear canals clear. + HOH, b/l hearing aides.  Neck: Supple without JVD or lymphadenopathy Heart: Reg rate and rhythm. No murmurs rubs or gallops Chest: CTA bilaterally without wheezes, rales, or rhonchi; no distress Abdomen: Soft, non-tender, non-distended, bowel sounds positive. Extremities: No clubbing, cyanosis, or edema.  Psych: Pt's affect is appropriate. Pt is cooperative. Skin: crani incisions cdi with staples intact.  Neuro: Improved attention today compared to prior, but continues with some deficits. exacerbated by [facility]. CN 2-12 grossly intact. Follows 3/3 commands.  Musculoskeletal:  No effusion or obvious deformity. Moves all 4 extremities antigravity.    Assessment/Plan: 1. Functional deficits which require 3+ hours per day of interdisciplinary therapy in a comprehensive inpatient rehab setting. Physiatrist is providing close team supervision and 24 hour management of active medical problems listed below. Physiatrist and rehab team continue to assess barriers to discharge/monitor patient progress toward functional and medical goals  Care Tool:  Bathing    Body parts bathed by patient: Face, Front perineal area   Body parts bathed by helper: Right arm, Left arm, Chest, Abdomen, Buttocks, Right upper leg, Left upper leg, Right lower leg, Left lower leg  Bathing assist Assist Level: Maximal Assistance - Patient 24 - 49%     Upper Body Dressing/Undressing Upper body dressing   What is the patient wearing?: Pull over shirt    Upper body assist Assist Level: Maximal Assistance - Patient 25 - 49%    Lower Body Dressing/Undressing Lower body dressing      What is the patient wearing?: Pants, Incontinence brief     Lower body assist Assist for lower body dressing: 2 Helpers     Toileting Toileting    Toileting assist Assist for  toileting: 2 Helpers     Transfers Chair/bed transfer  Transfers assist  Chair/bed transfer activity did not occur: Safety/medical concerns  Chair/bed transfer assist level: Dependent - mechanical lift     Locomotion Ambulation   Ambulation assist      Assist level: Moderate Assistance - Patient 50 - 74% Assistive device: Walker-rolling Max distance: 15   Walk 10 feet activity   Assist     Assist level: Moderate Assistance - Patient - 50 - 74%     Walk 50 feet activity   Assist Walk 50 feet with 2 turns activity did not occur: Safety/medical concerns         Walk 150 feet activity   Assist Walk 150 feet activity did not occur: Safety/medical concerns         Walk 10 feet on uneven surface  activity   Assist Walk 10 feet on uneven surfaces activity did not occur: Safety/medical concerns         Wheelchair     Assist Is the patient using a wheelchair?: Yes (per PT LTGs, pt to propel w/c at DC; note plan for w/c propulsion) Type of Wheelchair: Manual Wheelchair activity did not occur: Safety/medical concerns         Wheelchair 50 feet with 2 turns activity    Assist    Wheelchair 50 feet with 2 turns activity did not occur: Safety/medical concerns       Wheelchair 150 feet activity     Assist  Wheelchair 150 feet activity did not occur: Safety/medical concerns       Blood pressure 132/75, pulse 86, temperature 97.6 F (36.4 C), temperature source Oral, resp. rate 16, height 6' 6.5" (1.994 m), SpO2 97 %.  Medical Problem List and Plan: 1. Functional deficits secondary to bilateral SDH's after fall s/p bilateral burr hole evacuation 8/14             -patient may shower             -ELOS/Goals: 12-14 days, supervision to mod I goals with PT, OT, SLP  -CIR therapies including PT, OT, and SLP  2.  Antithrombotics: -DVT/anticoagulation:  Mechanical:  Antiembolism stockings, knee (TED hose) Bilateral lower  extremities Sequential compression devices, below knee Bilateral lower extremities             - If CT head 8/24 stable, ok to initiate Lovenox for DVT prophy 8/24             -antiplatelet therapy: n/a 3. Pain Management/headache: t             -Tylenol 1000 mg TID scheduled and DC hydrocodone 8/22 - HA resolved 4. Mood/Behavior/Sleep: trazodone prn for sleep - not using             -pt seems fairly up beat and wife is very supportive             -antipsychotic agents:  n/a 5. Neuropsych/cognition: This patient is not quite capable of making decisions on his own behalf. 6. Skin/Wound Care: local care to scalp, remove staples tomorrow 8/25   7. Fluids/Electrolytes/Nutrition: encourage appropriate po intake             -liberalized him to regular diet to help with po intake. Encouraged wife to bring food from home as well. Poor po intake so far however.              -check labs Monday - stable 8. Seizure prophylaxis: keppra '500mg'$  q 12 hours -> DC 8/23 9. Hx of Prostate Cancer, c/b urinary incontinence - ongoing             -flomax 0.'4mg'$  bid, proscar '5mg'$  daily              -emptying with intermittent incontinence thus far              - UA 8/23 without s/s infection             - After reviewing OP notes, was prescribed flomax 0.4 mg daily and was taking BID as OP. If no improvement in incontinence with other measures, will consider reduce back to daily.   10. HTN:              -hctz/lisinopril daily per home regimen             -monitor bp as he mobilizes more with therapy  - bp controlled 11. Osteoarthritis bilateral knees, R>L             -voltaren gel to both knees - improved pain             -observe knees with PT, might benefit from some type of bracing             -his knee pain certainly was contributing to his recent falls and balance deficits in the months prior to this admit - Patient denies any knee/joint pain with mobilization, but may be related to poor recall. Unsure if  posture, fatigue more related to OA or neurologic deficits. CT head today as below.   12. Delirium, likely multifactorial etiology (post-hospitalization, polypharmacy) - Ensure wearing hearing aides; wife has microphone available to amplify  - UA 8/23 stable; CBC 8/21-22 stable, no leukocytosis - Delirium precautions added and discussed with family - Keppra may be contributing, DC 8/23  - If no improvement w/ medication change or obvious infectious source, will consider repeat CT head  - ordered 8/24 - Trazodone PRN not used despite repeated endorsement by family of poor sleep; initiate sleep log 8/24   LOS: 5 days A FACE TO FACE EVALUATION WAS PERFORMED  Est Dispo: Tuesday, Sept 12th to home  Gertie Gowda 01/04/2022, 11:34 AM

## 2022-01-04 NOTE — Progress Notes (Signed)
Occupational Therapy Session Note  Patient Details  Name: Brandon [name] MRN: [id number] Date of Birth: 11-15-1945  Today's Date: 01/04/2022 OT Individual Time: 9179-2178 OT Individual Time Calculation (min): 58 min    Short Term Goals: Week 1:  OT Short Term Goal 1 (Week 1): Pt will demonstrate improved sitting balance and activity tolerance while completing self care tasks seated on EOB with Min A to maintain balance OT Short Term Goal 2 (Week 1): Pt will complete sit to stand with RW with Mod Assist x1. OT Short Term Goal 3 (Week 1): Pt will increase UB dressing to Min Assist OT Short Term Goal 4 (Week 1): Pt will increase toilet transfer to Mod A x1 using RW  Skilled Therapeutic Interventions/Progress Updates:    Pt greeted semi-reclined in bed with daughter present. She reported pt was anxious overnight and gets very frustrated when he is wet in brief. Pt's daughter stated she had to keep him from getting out of bed multiple times last night. Pt agreeable to shower this morning and was eager to get out of bed. Pt initiated bed mobility with min A. Sit<>stand in [place] with min A and +2 needed for safety. Pt transitioned into shower and initiated standing very well in [place]. Pt used wash cloths and soaps appropriately and washed body parts without cues from OT. Partial stand with mod A from OT while pt reached behind to wash buttocks. Asked pt if he needed to use the bathroom and he stated "I'll try". Stedy transfer to [facility] over toilet with min A, but pt unable to have BM or void. Worked on dressing tasks from wc at the sink with pt showing improved insight when donning pants stating " I think you're going to have to help me." Educated on dressing techniques, but pt still needed max A. Sit<>stand at the sink without stedy and max A with +2 needed to pull up pants. Mirror feedback and max facilitation to try to achieve full hip and trunk extension, but unable to get pt fill upright. Min A to don  shirt with mod cues for orientation. Pt demonstrated some apraxia within grooming tasks trying to place toothpaste as voltaren gel, but able to redirect and use item appropriately. Education provided to daughter regarding brain injury behaviors, rancho levels, and modifications. Pt left seated in TIS wc with alarm belt on, daughter present and needs met.   Therapy Documentation Precautions:  Precautions Precautions: Fall Restrictions Weight Bearing Restrictions: No General:   Vital Signs:   Pain:  Pt indicates B knee pain. Rest and repositioned.   Therapy/Group: Individual Therapy  Valma Cava 01/04/2022, 2:49 PM

## 2022-01-05 DIAGNOSIS — S069X9A Unspecified intracranial injury with loss of consciousness of unspecified duration, initial encounter: Secondary | ICD-10-CM | POA: Diagnosis not present

## 2022-01-05 LAB — GLUCOSE, CAPILLARY
Glucose-Capillary: 146 mg/dL — ABNORMAL HIGH (ref 70–99)
Glucose-Capillary: 216 mg/dL — ABNORMAL HIGH (ref 70–99)

## 2022-01-05 MED ORDER — ENOXAPARIN SODIUM 40 MG/0.4ML IJ SOSY
40.0000 mg | PREFILLED_SYRINGE | INTRAMUSCULAR | Status: DC
  Administered 2022-01-05 – 2022-01-22 (×18): 40 mg via SUBCUTANEOUS
  Filled 2022-01-05 (×18): qty 0.4

## 2022-01-05 MED ORDER — BUTALBITAL-APAP-CAFFEINE 50-325-40 MG PO TABS
1.0000 | ORAL_TABLET | Freq: Three times a day (TID) | ORAL | Status: DC
  Administered 2022-01-05 – 2022-01-08 (×9): 1 via ORAL
  Filled 2022-01-05 (×9): qty 1

## 2022-01-05 MED ORDER — DICLOFENAC SODIUM 1 % EX GEL
4.0000 g | Freq: Three times a day (TID) | CUTANEOUS | Status: DC
  Administered 2022-01-05 – 2022-01-23 (×46): 4 g via TOPICAL
  Filled 2022-01-05: qty 100

## 2022-01-05 MED ORDER — ACETAMINOPHEN 500 MG PO TABS
500.0000 mg | ORAL_TABLET | Freq: Two times a day (BID) | ORAL | Status: DC | PRN
  Administered 2022-01-07: 500 mg via ORAL
  Filled 2022-01-05: qty 1

## 2022-01-05 NOTE — Progress Notes (Signed)
Physical Therapy Session Note  Patient Details  Name: Brandon [name] MRN: [id number] Date of Birth: 31-Oct-1945  Today's Date: 01/05/2022 PT Individual Time: 0801-0859 PT Individual Time Calculation (min): 58 min   Short Term Goals: Week 1:  PT Short Term Goal 1 (Week 1): Pt will transfer sup to sit w/ min A. PT Short Term Goal 2 (Week 1): Pt will transfer sit to stand w/ min A PT Short Term Goal 3 (Week 1): Pt will amb x 50' w/ RW and min A PT Short Term Goal 4 (Week 1): Pt will assess stairs.  Skilled Therapeutic Interventions/Progress Updates:     Pt received supine in bed and initially shakes head "no" to therapy, saying that he cannot participate due to "MRI". Pt's daughter says that he is referring to a CT scan he had previous day, and that pt is appropriate to participate in therapy, just does not want to. PT provides gentle encouragement and pt is eventually agreeable to participating in therapy with promise of going outside. Pt reports headache and had taken tylenol just prior to session. PT provides rest breaks as needed to manage pain. Pt bridges in supine to assist with donning shorts for therapy. Supine to sit with bed features and minA/modA due to posterior lean upon sitting up at edge of bed PT provides cues for hand placement core engagement to facilitate improved balance. Pt performs transfer to [facility] with Stedy and minA/modA to facilitate safe hand placement and anterior weight shift. WC transport outside. Pt decides that it is too hot outside so WC transport back inside. Stand step transfer to mat with RW and modA/maxA with pt having difficulty clearing either foot from floor and standing with very flexed posture. In short sitting on edge of mat, pt participates in Connect 4 game with daughter to encourage pt to shift weight forward, reach outside BOS, and use attention and critical thinking. PT provides CGA for balance but pt has no LOBs. Following, pt does have posterior lean and  requires minA/modA to correct. Pt then ambulates x25' with 3 musketeers technique (maxA +2), and +3 WC follow. Pt has very flexed posture and shuffling gait pattern, and is unable to significantly correct.  Stedy transfer back to bed with modA. MinA for sit to supine. Left with alarm intact and all needs within reach.  Therapy Documentation Precautions:  Precautions Precautions: Fall Restrictions Weight Bearing Restrictions: No    Therapy/Group: Individual Therapy  Breck Coons, PT, DPT 01/05/2022, 12:14 PM

## 2022-01-05 NOTE — Progress Notes (Signed)
Occupational Therapy TBI Note  Patient Details  Name: Brandon [name] MRN: [id number] Date of Birth: 31-Jul-1945  Today's Date: 01/05/2022 OT Individual Time: 1300-1430 OT Individual Time Calculation (min): 90 min   Short Term Goals: Week 1:  OT Short Term Goal 1 (Week 1): Pt will demonstrate improved sitting balance and activity tolerance while completing self care tasks seated on EOB with Min A to maintain balance OT Short Term Goal 2 (Week 1): Pt will complete sit to stand with RW with Mod Assist x1. OT Short Term Goal 3 (Week 1): Pt will increase UB dressing to Min Assist OT Short Term Goal 4 (Week 1): Pt will increase toilet transfer to Mod A x1 using RW  Skilled Therapeutic Interventions/Progress Updates:    Pt greeted semi-reclined in bed with wife and daughter present, agreeable to OT treatment session. Pt agreeable to get OOB and go to the gym. OT used microphone clipped on shirt that goes directly to hearing aids with much improved communication throughout session. Pt initiated bed mobility and powered up into sitting with min A and min cues for motor planning. Pt impulsive to try to stand in stedy before it was correctly placed, however stood with min A in [place]. Pt needed cues to maintain sitting while stedy moved to the wc. Pt brought to therapy gym and utilized mirror feedback and the outside of parallel bars to facilitate standing. Focus on full hip and trunk extension when standing. Pt stated " I need to straighten my knees but I don't know how. After 3 trials, but with much improved extension into standing. Addressed step length in parallel bars using yoga block to kick when taking steps. Pt only tolerated taking 4 steps with kick before impulsively sitting, requiring close wc follow. Pt brought to ortho gym in [place] and utilized BITS for cognitive retraining. Pt able to read 3 words in row, then mactch them, but unable to understand the concept of placing them in order. Visual  scanning in all 4 quadrants with mild L inattention requiring min cues to locate dots on L side of screen. Trail making activity with numbers 1-8. Pt needed max cues to initiate and connect numbers after the first 2. Addressed UB endurance and finger isolation to reach and try to draw lines to the correlating numbers. Frequent rest breaks due to UE fatigue. Attempted task in standing with max A + 2 to get up. Pt able to hit one dot, before fatigue and return to sitting. Pt demonstrated mild L inattention, requiring min cues to locate dots on L side. UB there-ex using [employer] arm bike. Pt needed max cues to maintain attention to task. He completed 5 minutes, but needed 4 rest breaks. Pt returned to room and had been incontinent of urine. Stedy used for sit<>stands with max multimodal cues to initiate stand due to fatigue. Pt eventually able to get to standing for brief change, but was quick to sit back down before new brief was donned. Stedy transfer back to bed with max A to lift LE's back in bed and return to supine safely. PT left semi-reclined in bed with bed alarm on, call bell in reach, and needs met.   Therapy Documentation Precautions:  Precautions Precautions: Fall Restrictions Weight Bearing Restrictions: No  Pain: Pain Assessment Pain Scale: 0-10 Pain Score: 6  Pain Type: Acute pain Pain Location: Head Agitated Behavior Scale: TBI Observation Details Observation Environment: CIRD Start of observation period - Date: 01/05/22 Start of observation  period - Time: 1300 End of observation period - Date: 01/05/22 End of observation period - Time: 1430 Agitated Behavior Scale (DO NOT LEAVE BLANKS) Short attention span, easy distractibility, inability to concentrate: Present to a slight degree Impulsive, impatient, low tolerance for pain or frustration: Absent Uncooperative, resistant to care, demanding: Absent Violent and/or threatening violence toward people or property:  Absent Explosive and/or unpredictable anger: Absent Rocking, rubbing, moaning, or other self-stimulating behavior: Absent Pulling at tubes, restraints, etc.: Absent Wandering from treatment areas: Absent Restlessness, pacing, excessive movement: Absent Repetitive behaviors, motor, and/or verbal: Absent Rapid, loud, or excessive talking: Absent Sudden changes of mood: Absent Easily initiated or excessive crying and/or laughter: Absent Self-abusiveness, physical and/or verbal: Absent Agitated behavior scale total score: 15    Therapy/Group: Individual Therapy  Valma Cava 01/05/2022, 2:15 PM

## 2022-01-05 NOTE — Progress Notes (Signed)
PROGRESS NOTE   Subjective/Complaints:  Pt seen and evaluated at bedside with daughter present. Patient much more responsive to questions today, able to maintain attention to answer questions.   He endorses headaches intermittently, several times daily, lasting 2-3 hours, 6/10 severity, bifrontal. He denies any photosensitivity or nausea with these. They are worsened with activity/PT.   He also endorses stabbing pains throughput his bilateral lower legs with weightbearing and ambulation, especially behind his right > L knee. He states voltaren helps, but not enough to help him with mobility. He takes tylenol PRN every 2-3 days at home but otherwise no pain medications.  Discussed resuming lovenox for DVT ppx today; patient and family agreeable, deny any Hx GIB, bleeding disorder or ulcers. Approved by neurosurgery yesterday. CT head stable.   ROS: Patient denies fever, rash, sore throat, blurred vision, dizziness, nausea, vomiting, diarrhea, cough, shortness of breath or chest pain   Objective:  No results for input(s): "WBC", "HGB", "HCT", "PLT" in the last 72 hours.  No results for input(s): "NA", "K", "CL", "CO2", "GLUCOSE", "BUN", "CREATININE", "CALCIUM" in the last 72 hours.   Intake/Output Summary (Last 24 hours) at 01/05/2022 1047 Last data filed at 01/04/2022 1815 Gross per 24 hour  Intake 414 ml  Output --  Net 414 ml     Reviewed CT head 8/24 as follows:  IMPRESSION: 1. Interval removal of bilateral extra-axial drains. 2. Interval decrease in size of right extra-axial collection and resolution of midline shift. 3. The left-sided extra-axial fluid is more prominent than on the prior exam. This likely represents a shift of brain away from the skull rather than any positive pressure. 4. Residual hyperdense blood products are noted posteriorly on the right, less hyperintense than on the prior exam. This  represents expected evolution of the existing hemorrhage. 5. No new or progressive hemorrhage. 6. Atherosclerosis.  Independently reviewed imaging and agree with above interpretation. Stable bilateral SDH with reduction in midline shift from prior, expected fluid accumulation per neurosurgery.      Physical Exam: Vital Signs Blood pressure 136/78, pulse 89, temperature 98.6 F (37 C), resp. rate 16, height 6' 6.5" (1.994 m), SpO2 97 %.  General: Alert and oriented to self, time, place and event.  HEENT: ERRLA, EOMI, sclera anicteric, oral mucosa pink and moist, dentition intact, ext ear canals clear. + HOH, b/l hearing aides.  Neck: Supple without JVD or lymphadenopathy Heart: Reg rate and rhythm. No murmurs rubs or gallops Chest: CTA bilaterally without wheezes, rales, or rhonchi; no distress Abdomen: Soft, non-tender, non-distended, bowel sounds positive. Extremities: No clubbing, cyanosis, or edema. BL LE nontender to palpation, no effusion, defority, or crepitis appreciated.  Psych: Pt's affect is appropriate. Pt is cooperative. Skin: crani incisions cdi with staples intact.  Neuro: CN 2-12 grossly intact. Follows all commands, answers questions appropriately. Still some mild cognitive deficits but much improved attention and recall compared to prior exams. AAOx4.  Musculoskeletal: Moves all 4 extremities antigravity.    Assessment/Plan: 1. Functional deficits which require 3+ hours per day of interdisciplinary therapy in a comprehensive inpatient rehab setting. Physiatrist is providing close team supervision and 24 hour management of active medical problems listed below.  Physiatrist and rehab team continue to assess barriers to discharge/monitor patient progress toward functional and medical goals  Care Tool:  Bathing    Body parts bathed by patient: Face, Front perineal area   Body parts bathed by helper: Right arm, Left arm, Chest, Abdomen, Buttocks, Right upper leg, Left  upper leg, Right lower leg, Left lower leg     Bathing assist Assist Level: Maximal Assistance - Patient 24 - 49%     Upper Body Dressing/Undressing Upper body dressing   What is the patient wearing?: Pull over shirt    Upper body assist Assist Level: Maximal Assistance - Patient 25 - 49%    Lower Body Dressing/Undressing Lower body dressing      What is the patient wearing?: Pants, Incontinence brief     Lower body assist Assist for lower body dressing: 2 Helpers     Toileting Toileting    Toileting assist Assist for toileting: 2 Helpers     Transfers Chair/bed transfer  Transfers assist  Chair/bed transfer activity did not occur: Safety/medical concerns  Chair/bed transfer assist level: Dependent - mechanical lift     Locomotion Ambulation   Ambulation assist      Assist level: Moderate Assistance - Patient 50 - 74% Assistive device: Walker-rolling Max distance: 15   Walk 10 feet activity   Assist     Assist level: Moderate Assistance - Patient - 50 - 74%     Walk 50 feet activity   Assist Walk 50 feet with 2 turns activity did not occur: Safety/medical concerns         Walk 150 feet activity   Assist Walk 150 feet activity did not occur: Safety/medical concerns         Walk 10 feet on uneven surface  activity   Assist Walk 10 feet on uneven surfaces activity did not occur: Safety/medical concerns         Wheelchair     Assist Is the patient using a wheelchair?: Yes (per PT LTGs, pt to propel w/c at DC; note plan for w/c propulsion) Type of Wheelchair: Manual Wheelchair activity did not occur: Safety/medical concerns         Wheelchair 50 feet with 2 turns activity    Assist    Wheelchair 50 feet with 2 turns activity did not occur: Safety/medical concerns       Wheelchair 150 feet activity     Assist  Wheelchair 150 feet activity did not occur: Safety/medical concerns       Blood pressure  136/78, pulse 89, temperature 98.6 F (37 C), resp. rate 16, height 6' 6.5" (1.994 m), SpO2 97 %.  Medical Problem List and Plan: 1. Functional deficits secondary to bilateral SDH's after fall s/p bilateral burr hole evacuation 8/14             -patient may shower             -ELOS/Goals: 12-14 days, supervision to mod I goals with PT, OT, SLP  -CIR therapies including PT, OT, and SLP  2.  Antithrombotics: -DVT/anticoagulation:  Mechanical:  Antiembolism stockings, knee (TED hose) Bilateral lower extremities Sequential compression devices, below knee Bilateral lower extremities             - CT head 8/24 stable -> Lovenox 40 IM mg daily for DVT prophy 8/25             -antiplatelet therapy: n/a 3. Pain Management/headache: ongoing             -  Tylenol 1000 mg TID scheduled and DC hydrocodone 8/22 -> Fioricet 1 tab TID + Tylenol 500 mg BID PRN for HA, pain 8/25            - Inc voltaren gel to b/l knees form 2 to 4 g 8/25            - If no improvement in HA and leg pain, will consider adding Gabapentin 300 mg TID  4. Mood/Behavior/Sleep: trazodone prn for sleep - not using             -pt seems fairly up beat and wife is very supportive             -antipsychotic agents: n/a 5. Neuropsych/cognition: This patient is not quite capable of making decisions on his own behalf. 6. Skin/Wound Care: local care to scalp, removing staples 8/25   7. Fluids/Electrolytes/Nutrition: encourage appropriate po intake             -liberalized him to regular diet to help with po intake. Encouraged wife to bring food from home as well. Poor po intake so far however.              -check labs Monday - stable  8. Seizure prophylaxis: keppra '500mg'$  q 12 hours -> DC 8/23  9. Hx of Prostate Cancer, c/b urinary incontinence - ongoing             -flomax 0.'4mg'$  bid, proscar '5mg'$  daily              -emptying with intermittent incontinence thus far              - UA 8/23 without s/s infection             - After  reviewing OP notes, was prescribed flomax 0.4 mg daily and was taking BID as OP.              - Discussed incontinence of bowel and bladder with family in detail 8/25; they feel he is now close to his baseline, which is intermittently incontinent of both  10. HTN:              -hctz/lisinopril daily per home regimen; monitor for orthostasis             -monitor bp as he mobilizes more with therapy  - bp controlled  11. Osteoarthritis bilateral knees, R>L             -voltaren gel to both knees - increased 8/25               - Discussing RLE bracing with PT for support and sensory feedback 8/25             -his knee pain certainly was contributing to his recent falls and balance deficits in the months prior to this admit    12. Delirium, likely multifactorial etiology (post-hospitalization, polypharmacy) - improved - Ensure wearing hearing aides; wife has microphone available to amplify  - UA 8/23 stable; CBC 8/21-22 stable, no leukocytosis - Delirium precautions added and discussed with family - Keppra may be contributing, DC 8/23  - repeat CT head  - ordered 8/24 - stable - Trazodone PRN not used despite repeated endorsement by family of poor sleep; initiate sleep log 8/24 - 6-7 hours 8/25, adequite   LOS: 6 days A FACE TO FACE EVALUATION WAS PERFORMED  Est Dispo: Tuesday, Sept 12th to home  Gertie Gowda 01/05/2022, 10:47 AM

## 2022-01-05 NOTE — Progress Notes (Signed)
Speech Language Pathology TBI Note  Patient Details  Name: Brandon [name] MRN: [id number] Date of Birth: 1946-03-07  Today's Date: 01/05/2022 SLP Individual Time: 1001-1100 SLP Individual Time Calculation (min): 59 min  Short Term Goals: Week 1: SLP Short Term Goal 1 (Week 1): Pt will sustain his attention to basic, familiar tasks for 3-5 minute intervals wtih mod cues for redirection. SLP Short Term Goal 2 (Week 1): Pt will utilize external aids to recall daily information with mod cues. SLP Short Term Goal 3 (Week 1): Pt will initiate functional communication with mod assist. SLP Short Term Goal 4 (Week 1): Pt will utilize an increased vocal intensity to achieve intelligibility at the sentence level with mod assist.  Skilled Therapeutic Interventions: Pt seen for skilled ST with focus on cognitive goals, pt in bed with daughter present and DO in for rounds. Pt appears to be improved cognitively from previous date, independently oriented to place, situation, date, MOY and year. SLP facilitating simple picture naming task by providing min A cues for 100% accuracy, pt demonstrates increased error awareness and repair during activity. Pt did demonstrate increased word finding deficits during unstructured conversation, benefits from min phonemic cues to increase recall of target words. Daughter confirms pt errors in simple yes/no questions and pt with difficulty providing biographical history. SLP facilitating sustained attention throughout session by providing initial min A cues for redirection fading to mod A cues d/t fatigue and headache pain. Spoke with daughter about pt progress toward goals and general brain injury education. Pt left in bed with daughter present for needs, cont ST POC.  Pain Pain Assessment Pain Scale: 0-10 Pain Score: 6  Pain Type: Acute pain Pain Location: Head  Agitated Behavior Scale: TBI Observation Details Observation Environment: CIR Start of observation period  - Date: 01/05/22 Start of observation period - Time: 1000 End of observation period - Date: 01/05/22 End of observation period - Time: 1100 Agitated Behavior Scale (DO NOT LEAVE BLANKS) Short attention span, easy distractibility, inability to concentrate: Present to a slight degree Impulsive, impatient, low tolerance for pain or frustration: Absent Uncooperative, resistant to care, demanding: Absent Violent and/or threatening violence toward people or property: Absent Explosive and/or unpredictable anger: Absent Rocking, rubbing, moaning, or other self-stimulating behavior: Absent Pulling at tubes, restraints, etc.: Absent Wandering from treatment areas: Absent Restlessness, pacing, excessive movement: Absent Repetitive behaviors, motor, and/or verbal: Absent Rapid, loud, or excessive talking: Absent Sudden changes of mood: Absent Easily initiated or excessive crying and/or laughter: Absent Self-abusiveness, physical and/or verbal: Absent Agitated behavior scale total score: 15  Therapy/Group: Individual Therapy  [name] 01/05/2022, 10:39 AM

## 2022-01-05 NOTE — Consult Note (Signed)
Neuropsychological Consultation   Patient:   Brandon [name]   DOB:   July 14, 1945  MR Number:  [id number]  Location:  [facility] [id number] [place] [address] Dept: [place]: [phone]           Date of Service:   01/05/2022  Start Time:   9 AM End Time:   10 AM  Provider/Observer:  Ilean Skill, Psy.D.       Clinical Neuropsychologist       Billing Code/Service: [phone]  Chief Complaint:    Brandon [name] is a 76 year old male with past medical history including hypertension who presented to the emergency department on 12/24/2021 with frequent falls.  At that time, patient's wife stated that he had been having more issues with his balance over the prior several months.  Increasing knee pain had been noted.  Patient has been using cane for 3 weeks prior to the fall that brought him to the hospital.  Wife believes that he may have fallen and hit his head on a table.  There was a brief loss of consciousness with fall.  Patient has no recall of events around the fall.  Imaging revealed large bilateral subacute subdural hematomas with mass effect.  On 12/25/2021 patient underwent bilateral bur hole evacuation with drain placement.  Postoperatively patient began to progress.  Patient has been ambulating short distances with physical therapy and displaying slow improvement in self-awareness and mental status.  Patient evaluated and determined to be a good candidate for inpatient rehabilitative efforts and will has been admitted to CIR.  Reason for Service:  Patient was referred for neuropsychological consultation due to coping and adjustment with bilateral subdural hematoma with neurosurgical intervention and ongoing improvement.  Below is the HPI for the current admission.  HPI: This is a 76 year old male with a past medical history of hypertension who presented to the emergency room on  12/24/2021 with frequent falls.  His wife states that he has been having more issues with his balance over the last several months.  This is potentially due to increasing knee pain.  He has been using a cane for 3 weeks prior to the fall that brought him into the hospital.  His wife believes he may have fallen and hit a table in his room.  There was brief loss of consciousness.  Patient does not recall events of the fall.  Upon admission imaging revealed large bilateral subacute subdural hematomas with mass effect.  On 12/25/2021 patient underwent bilateral bur hole evacuation with drain placement by Dr. Earnie Larsson.  Postoperatively patient has begun to progress.  He has been ambulating short distance with physical therapy but still struggling with balance and self-care tasks.  Patient's drains were removed on December 28, 2021.  He was evaluated by the inpatient rehab team and felt to be a candidate for inpatient rehab and ultimately was admitted today.  Current Status:  Upon entering the room the patient was laying slightly elevated in his bed with daughter present.  Patient had some disturbance in mental status.  While he was ultimately oriented to year and month he had difficulty identifying what city he lived in but ultimately got it correct with time provided.  He got the hospital more quickly.  Patient very hard of hearing and daughter provided a microphone for me to wear that connected to his hearing aids.  Even without device the patient still had difficulty  with hearing well.  Patient initiated only a few specific questions during our interaction but denied significant anxiety and depression but clearly had difficulty with confusion, slowed information processing speed, reduced verbal fluency and reduced speed with expressive language.  Very hard to assess his receptive language capacity due to hearing loss.  Patient has had some regular urgency due to prostate issues and with his confusion etc. has a  tendency to try to get up unattended.  At this point daughter has been staying at night but may not be able to after Sunday.  Fall risk will need to be addressed although the patient is increasing his awareness.  Behavioral Observation: Brandon [name]  presents as a 76 y.o.-year-old Right Caucasian Male who appeared his stated age. his dress was Appropriate and he was Well Groomed and his manners were Appropriate to the situation although there was some significant confusion and slowed information processing.  Patient is very hard of hearing.  his participation was indicative of Appropriate behaviors.  There were physical disabilities noted.  he displayed an appropriate level of cooperation and motivation.     Interactions:    Active Redirectable  Attention:   abnormal and attention span appeared shorter than expected for age  Memory:   abnormal; remote memory intact, recent memory impaired  Visuo-spatial:  not examined  Speech (Volume):  low  Speech:   Word finding and slowed information processing speeds noted.  Thought Process:  Coherent and Circumstantial  Though Content:  WNL; not suicidal and not homicidal  Orientation:   person, place, and some degree of awareness of time with delayed processing and significant deficits with awareness regarding the situation.  Judgment:   Fair  Planning:   Poor  Affect:    Appropriate  Mood:    Dysphoric  Insight:   Shallow  Intelligence:   normal  Medical History:   Past Medical History:  Diagnosis Date   Allergy    Basal cell carcinoma    GERD (gastroesophageal reflux disease)    History of kidney cancer    benign; told not cancerous   Hypertension          Patient Active Problem List   Diagnosis Date Noted   Traumatic brain injury with loss of consciousness ([place]) 12/30/2021   SDH (subdural hematoma) ([facility]) 12/24/2021   Hyperlipidemia 02/13/2018   BPH (benign prostatic hyperplasia) 01/31/2017   Heart murmur 01/20/2016    HTN (hypertension) 03/09/2012     Psychiatric History:  No prior psychiatric history.  Family Med/Psych History:  Family History  Problem Relation Age of Onset   Cancer Mother        Breast    Cancer Father        Lung    Colon cancer Neg Hx     Impression/DX:  Brandon [name] is a 76 year old male with past medical history including hypertension who presented to the emergency department on 12/24/2021 with frequent falls.  At that time, patient's wife stated that he had been having more issues with his balance over the prior several months.  Increasing knee pain had been noted.  Patient has been using cane for 3 weeks prior to the fall that brought him to the hospital.  Wife believes that he may have fallen and hit his head on a table.  There was a brief loss of consciousness with fall.  Patient has no recall of events around the fall.  Imaging revealed large bilateral subacute subdural hematomas with mass  effect.  On 12/25/2021 patient underwent bilateral bur hole evacuation with drain placement.  Postoperatively patient began to progress.  Patient has been ambulating short distances with physical therapy and displaying slow improvement in self-awareness and mental status.  Patient evaluated and determined to be a good candidate for inpatient rehabilitative efforts and will has been admitted to CIR.  Upon entering the room the patient was laying slightly elevated in his bed with daughter present.  Patient had some disturbance in mental status.  While he was ultimately oriented to year and month he had difficulty identifying what city he lived in but ultimately got it correct with time provided.  He got the hospital more quickly.  Patient very hard of hearing and daughter provided a microphone for me to wear that connected to his hearing aids.  Even without device the patient still had difficulty with hearing well.  Patient initiated only a few specific questions during our interaction but denied  significant anxiety and depression but clearly had difficulty with confusion, slowed information processing speed, reduced verbal fluency and reduced speed with expressive language.  Very hard to assess his receptive language capacity due to hearing loss.  Patient has had some regular urgency due to prostate issues and with his confusion etc. has a tendency to try to get up unattended.  At this point daughter has been staying at night but may not be able to after Sunday.  Fall risk will need to be addressed although the patient is increasing his awareness.  Disposition/Plan:  We will need to address issues related to fall risk although the patient is slowly progressing as far as his awareness and judgment.  Diagnosis:    Bilateral subdural hematoma        Electronically Signed   _______________________ Ilean Skill, Psy.D. Clinical Neuropsychologist

## 2022-01-06 DIAGNOSIS — I10 Essential (primary) hypertension: Secondary | ICD-10-CM | POA: Diagnosis not present

## 2022-01-06 DIAGNOSIS — R410 Disorientation, unspecified: Secondary | ICD-10-CM

## 2022-01-06 DIAGNOSIS — G4700 Insomnia, unspecified: Secondary | ICD-10-CM

## 2022-01-06 DIAGNOSIS — S069X9D Unspecified intracranial injury with loss of consciousness of unspecified duration, subsequent encounter: Secondary | ICD-10-CM | POA: Diagnosis not present

## 2022-01-06 DIAGNOSIS — Z298 Encounter for other specified prophylactic measures: Secondary | ICD-10-CM

## 2022-01-06 LAB — CBC WITH DIFFERENTIAL/PLATELET
Abs Immature Granulocytes: 0.03 10*3/uL (ref 0.00–0.07)
Basophils Absolute: 0.1 10*3/uL (ref 0.0–0.1)
Basophils Relative: 1 %
Eosinophils Absolute: 0.1 10*3/uL (ref 0.0–0.5)
Eosinophils Relative: 1 %
HCT: 39 % (ref 39.0–52.0)
Hemoglobin: 13.2 g/dL (ref 13.0–17.0)
Immature Granulocytes: 0 %
Lymphocytes Relative: 12 %
Lymphs Abs: 0.9 10*3/uL (ref 0.7–4.0)
MCH: 30.1 pg (ref 26.0–34.0)
MCHC: 33.8 g/dL (ref 30.0–36.0)
MCV: 88.8 fL (ref 80.0–100.0)
Monocytes Absolute: 0.6 10*3/uL (ref 0.1–1.0)
Monocytes Relative: 8 %
Neutro Abs: 5.7 10*3/uL (ref 1.7–7.7)
Neutrophils Relative %: 78 %
Platelets: 236 10*3/uL (ref 150–400)
RBC: 4.39 MIL/uL (ref 4.22–5.81)
RDW: 12.5 % (ref 11.5–15.5)
WBC: 7.4 10*3/uL (ref 4.0–10.5)
nRBC: 0 % (ref 0.0–0.2)

## 2022-01-06 LAB — BASIC METABOLIC PANEL
Anion gap: 9 (ref 5–15)
BUN: 15 mg/dL (ref 8–23)
CO2: 26 mmol/L (ref 22–32)
Calcium: 9 mg/dL (ref 8.9–10.3)
Chloride: 101 mmol/L (ref 98–111)
Creatinine, Ser: 1.06 mg/dL (ref 0.61–1.24)
GFR, Estimated: >60 mL/min (ref >60)
Glucose, Bld: 237 mg/dL — ABNORMAL HIGH (ref 70–99)
Potassium: 3.3 mmol/L — ABNORMAL LOW (ref 3.5–5.1)
Sodium: 136 mmol/L (ref 135–145)

## 2022-01-06 MED ORDER — TRAZODONE HCL 50 MG PO TABS
50.0000 mg | ORAL_TABLET | Freq: Every day | ORAL | Status: DC
  Administered 2022-01-06: 50 mg via ORAL
  Filled 2022-01-06: qty 1

## 2022-01-06 MED ORDER — POTASSIUM CHLORIDE 20 MEQ PO PACK
20.0000 meq | PACK | Freq: Two times a day (BID) | ORAL | Status: AC
Start: 2022-01-07 — End: 2022-01-07
  Administered 2022-01-07 (×2): 20 meq via ORAL
  Filled 2022-01-06 (×2): qty 1

## 2022-01-06 NOTE — Progress Notes (Signed)
Physical Therapy Session Note  Patient Details  Name: Brandon [name] MRN: [id number] Date of Birth: 13-Aug-1945  Today's Date: 01/06/2022 PT Individual Time: 1300-1345 PT Individual Time Calculation (min): 45 min   Short Term Goals: Week 1:  PT Short Term Goal 1 (Week 1): Pt will transfer sup to sit w/ min A. PT Short Term Goal 2 (Week 1): Pt will transfer sit to stand w/ min A PT Short Term Goal 3 (Week 1): Pt will amb x 50' w/ RW and min A PT Short Term Goal 4 (Week 1): Pt will assess stairs.  Skilled Therapeutic Interventions/Progress Updates:  Pt was seen bedside in the pm. Pt rolled R/L with siderail and min A to assist with brief change and donning shorts. Pt transferred supine to edge of bed with mod to max A and verbal cues. Pt transferred sit to stand with mod A and verbal cues. Pt transferred edge of bed to w/c with mod A and second person standby for safety. In gym, pt performed multiple stands with rolling walker and mod A with verbal cues. Pt ambulated 5-6 feet x 3 with rolling walker and mod A with second person w/c follow for safety. Pt returned to room and left sitting up in w/c with chair alarm in place and family at bedside.   Therapy Documentation Precautions:  Precautions Precautions: Fall Restrictions Weight Bearing Restrictions: No General:   Pain: No c/o pain.   Therapy/Group: Individual Therapy  Dub Amis 01/06/2022, 3:46 PM

## 2022-01-06 NOTE — Progress Notes (Addendum)
PROGRESS NOTE   Subjective/Complaints:  Pt seen at bedside today. Multiple family members in room today.  Pt noted by family to be more confused and agitated this AM and yesterday. This afternoon is is doing better and participated with therapy. He got poor sleep last night, did not get PRN trazadone.   ROS: Patient denies fever, rash, sore throat, blurred vision, dizziness, nausea, vomiting, diarrhea, cough, shortness of breath or chest pain   Objective:  No results for input(s): "WBC", "HGB", "HCT", "PLT" in the last 72 hours.  No results for input(s): "NA", "K", "CL", "CO2", "GLUCOSE", "BUN", "CREATININE", "CALCIUM" in the last 72 hours.   Intake/Output Summary (Last 24 hours) at 01/06/2022 1408 Last data filed at 01/06/2022 0000 Gross per 24 hour  Intake 477 ml  Output --  Net 477 ml     Reviewed CT head 8/24 as follows:  IMPRESSION: 1. Interval removal of bilateral extra-axial drains. 2. Interval decrease in size of right extra-axial collection and resolution of midline shift. 3. The left-sided extra-axial fluid is more prominent than on the prior exam. This likely represents a shift of brain away from the skull rather than any positive pressure. 4. Residual hyperdense blood products are noted posteriorly on the right, less hyperintense than on the prior exam. This represents expected evolution of the existing hemorrhage. 5. No new or progressive hemorrhage. 6. Atherosclerosis.  Independently reviewed imaging and agree with above interpretation. Stable bilateral SDH with reduction in midline shift from prior, expected fluid accumulation per neurosurgery.      Physical Exam: Vital Signs Blood pressure 137/76, pulse 86, temperature 98.8 F (37.1 C), resp. rate 18, height 6' 6.5" (1.994 m), weight 107.8 kg, SpO2 96 %.  General: Alert and oriented to self, place, event, not time (reports 2013, 29 august) Able to  name his daughter HEENT: ERRLA, EOMI, sclera anicteric, oral mucosa pink and moist, dentition intact, ext ear canals clear. + HOH, b/l hearing aides.  Neck: Supple without JVD or lymphadenopathy Heart: Reg rate and rhythm. No murmurs rubs or gallops Chest: CTA bilaterally without wheezes, rales, or rhonchi; no distress Abdomen: Soft, non-tender, non-distended, bowel sounds positive. Extremities: No clubbing, cyanosis, or edema. BL LE nontender to palpation, no effusion, defority, or crepitis appreciated.  Psych: Pt's affect is appropriate. Pt is cooperative. Skin: crani incisions cdi with staples intact.  Neuro: CN 2-12 grossly intact. Follows all commands, answers questions appropriately. Still some mild cognitive deficits but much improved attention and recall compared to prior exams. Musculoskeletal: Moves all 4 extremities antigravity.    Assessment/Plan: 1. Functional deficits which require 3+ hours per day of interdisciplinary therapy in a comprehensive inpatient rehab setting. Physiatrist is providing close team supervision and 24 hour management of active medical problems listed below. Physiatrist and rehab team continue to assess barriers to discharge/monitor patient progress toward functional and medical goals  Care Tool:  Bathing    Body parts bathed by patient: Face, Front perineal area   Body parts bathed by helper: Right arm, Left arm, Chest, Abdomen, Buttocks, Right upper leg, Left upper leg, Right lower leg, Left lower leg     Bathing assist Assist Level: Maximal Assistance -  Patient 24 - 49%     Upper Body Dressing/Undressing Upper body dressing   What is the patient wearing?: Pull over shirt    Upper body assist Assist Level: Maximal Assistance - Patient 25 - 49%    Lower Body Dressing/Undressing Lower body dressing      What is the patient wearing?: Pants, Incontinence brief     Lower body assist Assist for lower body dressing: 2 Helpers      Toileting Toileting    Toileting assist Assist for toileting: 2 Helpers     Transfers Chair/bed transfer  Transfers assist  Chair/bed transfer activity did not occur: Safety/medical concerns  Chair/bed transfer assist level: Dependent - mechanical lift     Locomotion Ambulation   Ambulation assist      Assist level: Moderate Assistance - Patient 50 - 74% Assistive device: Walker-rolling Max distance: 15   Walk 10 feet activity   Assist     Assist level: Moderate Assistance - Patient - 50 - 74%     Walk 50 feet activity   Assist Walk 50 feet with 2 turns activity did not occur: Safety/medical concerns         Walk 150 feet activity   Assist Walk 150 feet activity did not occur: Safety/medical concerns         Walk 10 feet on uneven surface  activity   Assist Walk 10 feet on uneven surfaces activity did not occur: Safety/medical concerns         Wheelchair     Assist Is the patient using a wheelchair?: Yes (per PT LTGs, pt to propel w/c at DC; note plan for w/c propulsion) Type of Wheelchair: Manual Wheelchair activity did not occur: Safety/medical concerns         Wheelchair 50 feet with 2 turns activity    Assist    Wheelchair 50 feet with 2 turns activity did not occur: Safety/medical concerns       Wheelchair 150 feet activity     Assist  Wheelchair 150 feet activity did not occur: Safety/medical concerns       Blood pressure 137/76, pulse 86, temperature 98.8 F (37.1 C), resp. rate 18, height 6' 6.5" (1.994 m), weight 107.8 kg, SpO2 96 %.  Medical Problem List and Plan: 1. Functional deficits secondary to bilateral SDH's after fall s/p bilateral burr hole evacuation 8/14             -patient may shower             -ELOS/Goals: 12-14 days, supervision to mod I goals with PT, OT, SLP  -CIR therapies including PT, OT, and SLP  2.  Antithrombotics: -DVT/anticoagulation:  Mechanical:  Antiembolism stockings,  knee (TED hose) Bilateral lower extremities Sequential compression devices, below knee Bilateral lower extremities             - CT head 8/24 stable -> Lovenox 40 IM mg daily for DVT prophy 8/25             -antiplatelet therapy: n/a 3. Pain Management/headache: ongoing             -Tylenol 1000 mg TID scheduled and DC hydrocodone 8/22 -> Fioricet 1 tab TID + Tylenol 500 mg BID PRN for HA, pain 8/25            - Inc voltaren gel to b/l knees form 2 to 4 g 8/25            - If no improvement  in HA and leg pain, will consider adding Gabapentin 300 mg TID  4. Mood/Behavior/Sleep: trazodone prn for sleep - not using             -pt seems fairly up beat and wife is very supportive             -antipsychotic agents: n/a 5. Neuropsych/cognition: This patient is not quite capable of making decisions on his own behalf. 6. Skin/Wound Care: local care to scalp, removing staples 8/25   7. Fluids/Electrolytes/Nutrition: encourage appropriate po intake             -liberalized him to regular diet to help with po intake. Encouraged wife to bring food from home as well. Poor po intake so far however.              -check labs Monday - stable  8. Seizure prophylaxis: keppra '500mg'$  q 12 hours -> DC 8/23  -No reported seizure activity 9. Hx of Prostate Cancer, c/b urinary incontinence - ongoing             -flomax 0.'4mg'$  bid, proscar '5mg'$  daily              -emptying with intermittent incontinence thus far              - UA 8/23 without s/s infection             - After reviewing OP notes, was prescribed flomax 0.4 mg daily and was taking BID as OP.              - Discussed incontinence of bowel and bladder with family in detail 8/25; they feel he is now close to his baseline, which is intermittently incontinent of both  10. HTN:              -hctz/lisinopril daily per home regimen; monitor for orthostasis             -monitor bp as he mobilizes more with therapy  - 8/26 well controlled, follow  11.  Osteoarthritis bilateral knees, R>L             -voltaren gel to both knees - increased 8/25               - Discussing RLE bracing with PT for support and sensory feedback 8/25             -his knee pain certainly was contributing to his recent falls and balance deficits in the months prior to this admit    12. Delirium, likely multifactorial etiology (post-hospitalization, polypharmacy) - improved - Ensure wearing hearing aides; wife has microphone available to amplify  - UA 8/23 stable; CBC 8/21-22 stable, no leukocytosis - Delirium precautions added and discussed with family - Keppra may be contributing, DC 8/23  - repeat CT head  - ordered 8/24 - stable - Trazodone PRN not used despite repeated endorsement by family of poor sleep; initiate sleep log 8/24 - 6-7 hours 8/25, adequate -Altered sleep wake cycle- trazodone not given last night 8/25, will schedule -Will check BMET, CBC  Late addendum- K+ 3.3 will add KCL supplement,recheck monday  LOS: 7 days A FACE TO FACE EVALUATION WAS PERFORMED  Est Dispo: Tuesday, Sept 12th to home  Jennye Boroughs 01/06/2022, 2:08 PM

## 2022-01-06 NOTE — Progress Notes (Signed)
Occupational Therapy Session Note  Patient Details  Name: Brandon [name] MRN: [id number] Date of Birth: 1946/02/25  Today's Date: 01/06/2022 OT Individual Time: 1415-1500 OT Individual Time Calculation (min): 45 min    Short Term Goals: Week 1:  OT Short Term Goal 1 (Week 1): Pt will demonstrate improved sitting balance and activity tolerance while completing self care tasks seated on EOB with Min A to maintain balance OT Short Term Goal 2 (Week 1): Pt will complete sit to stand with RW with Mod Assist x1. OT Short Term Goal 3 (Week 1): Pt will increase UB dressing to Min Assist OT Short Term Goal 4 (Week 1): Pt will increase toilet transfer to Mod A x1 using RW  Skilled Therapeutic Interventions/Progress Updates:    Note initially attempted OT session in AM but family stating pt had been "up and down all night and is finally sleeping." Returned in afternoon, pt up in [place] chair with family stating pt eager to have hair washed now that staples were removed. Spoke with nursing and confirmed OK. Pt set up at sink for hair washing task, tactile and verbal cues throughout for leaning head back far enough for OT to assist with hair washing. Pt set up at sink for drying hair and brushing (cues to be careful at healing incision sites). Pt able to brush hair on R side only, needing max cues to attend to L side. Pt changing shirt with MOD A, verbal/visual cue to initiate but pt able to pull overhead. Donning new shirt MOD A as well, set up shirt in lap with pt able to correctly orient front/back and thread arms, assist pulling down all the way in the back. Stedy 2 assist for safety and height to bed. Sit > supine and alarm on call bell in reach.   Therapy Documentation Precautions:  Precautions Precautions: Fall Restrictions Weight Bearing Restrictions: No    Therapy/Group: Individual Therapy  Viona Gilmore 01/06/2022, 7:25 AM

## 2022-01-07 DIAGNOSIS — G4700 Insomnia, unspecified: Secondary | ICD-10-CM | POA: Diagnosis not present

## 2022-01-07 DIAGNOSIS — S069X9D Unspecified intracranial injury with loss of consciousness of unspecified duration, subsequent encounter: Secondary | ICD-10-CM | POA: Diagnosis not present

## 2022-01-07 DIAGNOSIS — E876 Hypokalemia: Secondary | ICD-10-CM

## 2022-01-07 DIAGNOSIS — R410 Disorientation, unspecified: Secondary | ICD-10-CM | POA: Diagnosis not present

## 2022-01-07 DIAGNOSIS — I10 Essential (primary) hypertension: Secondary | ICD-10-CM | POA: Diagnosis not present

## 2022-01-07 MED ORDER — MELATONIN 5 MG PO TABS
5.0000 mg | ORAL_TABLET | Freq: Every evening | ORAL | Status: DC | PRN
  Administered 2022-01-07 – 2022-01-08 (×3): 5 mg via ORAL
  Filled 2022-01-07 (×3): qty 1

## 2022-01-07 MED ORDER — TRAZODONE HCL 50 MG PO TABS
75.0000 mg | ORAL_TABLET | Freq: Every day | ORAL | Status: DC
  Administered 2022-01-07 – 2022-01-08 (×2): 75 mg via ORAL
  Filled 2022-01-07 (×2): qty 2

## 2022-01-07 NOTE — Progress Notes (Signed)
Occupational Therapy Session Note  Patient Details  Name: Brandon [name] MRN: [id number] Date of Birth: [birth year]  Today's Date: 01/07/2022 OT Individual Time: 1430-1530 OT Individual Time Calculation (min): 60 min    Short Term Goals: Week 1:  OT Short Term Goal 1 (Week 1): Pt will demonstrate improved sitting balance and activity tolerance while completing self care tasks seated on EOB with Min A to maintain balance OT Short Term Goal 2 (Week 1): Pt will complete sit to stand with RW with Mod Assist x1. OT Short Term Goal 3 (Week 1): Pt will increase UB dressing to Min Assist OT Short Term Goal 4 (Week 1): Pt will increase toilet transfer to Mod A x1 using RW  Skilled Therapeutic Interventions/Progress Updates:    Upon OT arrival, pt semi recumbent in bed and spouse present. Pt agreeable to OT treatment session and reports no pain. Treatment intervention with a focus on self care retraining, functional transfers, dynamic sitting balance and strengthening. Pt's brief soiled. Pt able to push pants and brief down from hips to thighs and requires Max A for toileting tasks bed level. Pt transitions to EOB with Max A x 2 and completes sit to stand transfer with sara stedy and Max A x 2 while pants were managed. Pt was transferred into w/c and transported to dayroom gym via TIS and total A. Pt engages in repeitive reaching task retrieving cards from all planes and placing them onto matching card on velcro board using B UE. Pt able to place 18 cards onto board. Pt requires short seated rest breaks as needed and verbal cues to recognize min errors and correct occasionally. Pt was returned to his room via w/c and completes stand pivot transfer to bed with Mod A x 2. Pt's socks doffed with total A. Pt transitions to supine with Mod A and was left in bed at end of session with all needs met and safety measures in place.    Therapy Documentation Precautions:  Precautions Precautions:  Fall Restrictions Weight Bearing Restrictions: No    Therapy/Group: Individual Therapy  Marvetta Gibbons 01/07/2022, 4:53 PM

## 2022-01-07 NOTE — Progress Notes (Signed)
PROGRESS NOTE   Subjective/Complaints:  Confusion has continues to improve per family. Poor sleep last night.  He often mentions wanted to go home.   ROS: Patient denies fever, rash, sore throat, blurred vision, dizziness, nausea, vomiting, diarrhea, cough, shortness of breath or chest pain   Objective:  Recent Labs    01/06/22 1506  WBC 7.4  HGB 13.2  HCT 39.0  PLT 236    Recent Labs    01/06/22 1506  NA 136  K 3.3*  CL 101  CO2 26  GLUCOSE 237*  BUN 15  CREATININE 1.06  CALCIUM 9.0     Intake/Output Summary (Last 24 hours) at 01/07/2022 1352 Last data filed at 01/06/2022 2200 Gross per 24 hour  Intake 120 ml  Output --  Net 120 ml     Reviewed CT head 8/24 as follows:  IMPRESSION: 1. Interval removal of bilateral extra-axial drains. 2. Interval decrease in size of right extra-axial collection and resolution of midline shift. 3. The left-sided extra-axial fluid is more prominent than on the prior exam. This likely represents a shift of brain away from the skull rather than any positive pressure. 4. Residual hyperdense blood products are noted posteriorly on the right, less hyperintense than on the prior exam. This represents expected evolution of the existing hemorrhage. 5. No new or progressive hemorrhage. 6. Atherosclerosis.  Independently reviewed imaging and agree with above interpretation. Stable bilateral SDH with reduction in midline shift from prior, expected fluid accumulation per neurosurgery.      Physical Exam: Vital Signs Blood pressure 120/76, pulse 82, temperature 97.9 F (36.6 C), temperature source Oral, resp. rate 16, height 6' 6.5" (1.994 m), weight 107.8 kg, SpO2 97 %.  General: Alert and oriented to self, place, event, not time  HEENT: ERRLA, EOMI, sclera anicteric, oral mucosa pink and moist, dentition intact, ext ear canals clear. + HOH, b/l hearing aides.  Neck: Supple  without JVD or lymphadenopathy Heart: Reg rate and rhythm. No murmurs rubs or gallops Chest: CTA bilaterally without wheezes, rales, or rhonchi; no distress, good air movement Abdomen: Soft, non-tender, non-distended, bowel sounds positive. Extremities: No clubbing, cyanosis, or edema. BL LE nontender to palpation, no effusion, defority, or crepitis appreciated.  Psych: Pt's affect is appropriate. Pt is cooperative. Skin: crani incisions cdi with staples intact.  Neuro: CN 2-12 grossly intact. Follows all commands, answers questions appropriately. Still some mild cognitive deficits but much improved attention and recall compared to prior exams. Musculoskeletal: Moves all 4 extremities antigravity.    Assessment/Plan: 1. Functional deficits which require 3+ hours per day of interdisciplinary therapy in a comprehensive inpatient rehab setting. Physiatrist is providing close team supervision and 24 hour management of active medical problems listed below. Physiatrist and rehab team continue to assess barriers to discharge/monitor patient progress toward functional and medical goals  Care Tool:  Bathing    Body parts bathed by patient: Face, Front perineal area   Body parts bathed by helper: Right arm, Left arm, Chest, Abdomen, Buttocks, Right upper leg, Left upper leg, Right lower leg, Left lower leg     Bathing assist Assist Level: Maximal Assistance - Patient 24 - 49%  Upper Body Dressing/Undressing Upper body dressing   What is the patient wearing?: Pull over shirt    Upper body assist Assist Level: Maximal Assistance - Patient 25 - 49%    Lower Body Dressing/Undressing Lower body dressing      What is the patient wearing?: Pants, Incontinence brief     Lower body assist Assist for lower body dressing: 2 Helpers     Toileting Toileting    Toileting assist Assist for toileting: 2 Helpers     Transfers Chair/bed transfer  Transfers assist  Chair/bed transfer  activity did not occur: Safety/medical concerns  Chair/bed transfer assist level: 2 Helpers     Locomotion Ambulation   Ambulation assist      Assist level: Moderate Assistance - Patient 50 - 74% Assistive device: Walker-rolling Max distance: 6   Walk 10 feet activity   Assist     Assist level: Moderate Assistance - Patient - 50 - 74%     Walk 50 feet activity   Assist Walk 50 feet with 2 turns activity did not occur: Safety/medical concerns         Walk 150 feet activity   Assist Walk 150 feet activity did not occur: Safety/medical concerns         Walk 10 feet on uneven surface  activity   Assist Walk 10 feet on uneven surfaces activity did not occur: Safety/medical concerns         Wheelchair     Assist Is the patient using a wheelchair?: Yes (per PT LTGs, pt to propel w/c at DC; note plan for w/c propulsion) Type of Wheelchair: Manual Wheelchair activity did not occur: Safety/medical concerns         Wheelchair 50 feet with 2 turns activity    Assist    Wheelchair 50 feet with 2 turns activity did not occur: Safety/medical concerns       Wheelchair 150 feet activity     Assist  Wheelchair 150 feet activity did not occur: Safety/medical concerns       Blood pressure 120/76, pulse 82, temperature 97.9 F (36.6 C), temperature source Oral, resp. rate 16, height 6' 6.5" (1.994 m), weight 107.8 kg, SpO2 97 %.  Medical Problem List and Plan: 1. Functional deficits secondary to bilateral SDH's after fall s/p bilateral burr hole evacuation 8/14             -patient may shower             -ELOS/Goals: 12-14 days, supervision to mod I goals with PT, OT, SLP  -CIR therapies including PT, OT, and SLP  2.  Antithrombotics: -DVT/anticoagulation:  Mechanical:  Antiembolism stockings, knee (TED hose) Bilateral lower extremities Sequential compression devices, below knee Bilateral lower extremities             - CT head 8/24  stable -> Lovenox 40 IM mg daily for DVT prophy 8/25             -antiplatelet therapy: n/a 3. Pain Management/headache: ongoing             -Tylenol 1000 mg TID scheduled and DC hydrocodone 8/22 -> Fioricet 1 tab TID + Tylenol 500 mg BID PRN for HA, pain 8/25            - Inc voltaren gel to b/l knees form 2 to 4 g 8/25            - If no improvement in HA and leg pain, will consider  adding Gabapentin 300 mg TID  4. Mood/Behavior/Sleep: trazodone prn for sleep - not using             -pt seems fairly up beat and wife is very supportive             -antipsychotic agents: n/a 5. Neuropsych/cognition: This patient is not quite capable of making decisions on his own behalf. 6. Skin/Wound Care: local care to scalp, removing staples 8/25   7. Fluids/Electrolytes/Nutrition: encourage appropriate po intake             -liberalized him to regular diet to help with po intake. Encouraged wife to bring food from home as well. Poor po intake so far however.              -check labs Monday - stable  8. Seizure prophylaxis: keppra '500mg'$  q 12 hours -> DC 8/23 9. Hx of Prostate Cancer, c/b urinary incontinence - ongoing             -flomax 0.'4mg'$  bid, proscar '5mg'$  daily              -emptying with intermittent incontinence thus far              - UA 8/23 without s/s infection             - After reviewing OP notes, was prescribed flomax 0.4 mg daily and was taking BID as OP.              - Discussed incontinence of bowel and bladder with family in detail 8/25; they feel he is now close to his baseline, which is intermittently incontinent of both  10. HTN:              -hctz/lisinopril daily per home regimen; monitor for orthostasis             -monitor bp as he mobilizes more with therapy  - 8/27 well controlled  11. Osteoarthritis bilateral knees, R>L             -voltaren gel to both knees - increased 8/25               - Discussing RLE bracing with PT for support and sensory feedback 8/25              -his knee pain certainly was contributing to his recent falls and balance deficits in the months prior to this admit    12. Delirium, likely multifactorial etiology (post-hospitalization, polypharmacy) - improved - Ensure wearing hearing aides; wife has microphone available to amplify  - UA 8/23 stable; CBC 8/21-22 stable, no leukocytosis - Delirium precautions added and discussed with family - Keppra may be contributing, DC 8/23  - repeat CT head  - ordered 8/24 - stable - Trazodone PRN not used despite repeated endorsement by family of poor sleep; initiate sleep log 8/24 - 6-7 hours 8/25, adequate -8/26 Altered sleep wake cycle- trazodone not given last night 8/25, will schedule, Will check BMET, CBC- WBC stable 7.4 8/27 will increase trazodone to '75mg'$  for sleep  Hypokalemia -K+ 3.3 , KCL supplement,recheck tomorrow  LOS: 8 days A FACE TO FACE EVALUATION WAS PERFORMED  Est Dispo: Tuesday, Sept 12th to home  Jennye Boroughs 01/07/2022, 1:52 PM

## 2022-01-07 NOTE — Progress Notes (Signed)
Physical Therapy Session Note  Patient Details  Name: WINTHROP SHANNAHAN MRN: [id number] Date of Birth: 05/03/1946  Today's Date: 01/07/2022 PT Individual Time: 1007-1050 PT Individual Time Calculation (min): 43 min   Short Term Goals: Week 1:  PT Short Term Goal 1 (Week 1): Pt will transfer sup to sit w/ min A. PT Short Term Goal 2 (Week 1): Pt will transfer sit to stand w/ min A PT Short Term Goal 3 (Week 1): Pt will amb x 50' w/ RW and min A PT Short Term Goal 4 (Week 1): Pt will assess stairs.   Skilled Therapeutic Interventions/Progress Updates:  Patient seated upright in TIS w/c on entrance to room. Patient alert and agreeable to PT session. Wife and dtr present in room for session.   Patient with no pain complaint at start of session.  Therapeutic Activity: Bed Mobility: Pt performed  sit--> supine with ModA for UB and LB coordination. Requires repositioning of shoulders/ head with ModA and is able to reposition BLE with vc/ tc and CGA. Transfers/ NMR: Pt performed sit<>stand transfers improving from [place] with +2 of MinA to RW x5. Pt is unable to shift weight forward and remains over heels for all but last standing bout. Requires ModA  with MinA from +2 throughout. Is able to laterally weight shift during one bout, pick up feet from floor reciprocally in next, and perform minisquats x5 prior to move to sit in final bout. NDT cueing provied throughout sit<>stand for improved forward weight shift over feet in rise to stand. In final rise to stand, pt is able to lean forward and appropriately balance over feet with CGA. Attempts to step forward x4 with very small, tentative progression forward.   D/t improved foot movement in final standing bout, stand pivot transfer attempted to return pt to bed at end of session. Requires 2 attempts as pt unwilling to release Bil hands from w/c armrests in first attempts. Second attempt with improved performance and hand placement on PT's shoulders after  explanation, and pt return demonstration while seated. Pushes to stand, then with encouragement from [facility] and family, pt is able to place hands on PT's shoulders. PT guided pt with facilitation of lateral weight shifting and vc for sequence in order to change foot position to pivot to bed. Pt is able to follow instructions and perform halfway, then requires additional tc and vc from family.  Is able to complete 3/4 of pivot turn and able to sit safely on EOB. Requires ModA +1 to complete.   NMR performed for improvements in motor control and coordination, balance, sequencing, judgement, and self confidence/ efficacy in performing all aspects of mobility at highest level of independence.   Patient supine at end of session with brakes locked, bed alarm set, and all needs within reach. Family present. Pt tells PT, "I will work with you."  Dtr relating potential need for change of therapist after observing pt with primary on Monday. Dtr is hoping that pt does not relate primary PT with someone from pt's past whom pt does not care for. This has created issue with participation in past week. Related to dtr that therapy staff will assist as best as they can to ensure that pt participates to highest degree while here in IP rehab.    Therapy Documentation Precautions:  Precautions Precautions: Fall Restrictions Weight Bearing Restrictions: No General:   Vital Signs:   Pain:  No pain complaint from pt this session.   Therapy/Group: Individual Therapy  Alger Simons PT, DPT, CSRS 01/07/2022, 3:27 PM

## 2022-01-08 LAB — BASIC METABOLIC PANEL
Anion gap: 7 (ref 5–15)
BUN: 20 mg/dL (ref 8–23)
CO2: 27 mmol/L (ref 22–32)
Calcium: 9.1 mg/dL (ref 8.9–10.3)
Chloride: 105 mmol/L (ref 98–111)
Creatinine, Ser: 1 mg/dL (ref 0.61–1.24)
GFR, Estimated: >60 mL/min (ref >60)
Glucose, Bld: 141 mg/dL — ABNORMAL HIGH (ref 70–99)
Potassium: 3.7 mmol/L (ref 3.5–5.1)
Sodium: 139 mmol/L (ref 135–145)

## 2022-01-08 LAB — MAGNESIUM: Magnesium: 1.8 mg/dL (ref 1.7–2.4)

## 2022-01-08 MED ORDER — SENNOSIDES-DOCUSATE SODIUM 8.6-50 MG PO TABS
1.0000 | ORAL_TABLET | Freq: Every day | ORAL | Status: DC
  Administered 2022-01-08: 1 via ORAL
  Filled 2022-01-08: qty 1

## 2022-01-08 MED ORDER — GABAPENTIN 100 MG PO CAPS
100.0000 mg | ORAL_CAPSULE | Freq: Two times a day (BID) | ORAL | Status: DC
  Administered 2022-01-08 – 2022-01-09 (×3): 100 mg via ORAL
  Filled 2022-01-08 (×3): qty 1

## 2022-01-08 MED ORDER — BUTALBITAL-APAP-CAFFEINE 50-325-40 MG PO TABS: 1.0000 | ORAL_TABLET | Freq: Three times a day (TID) | ORAL | Status: DC | PRN

## 2022-01-08 MED ORDER — ACETAMINOPHEN 500 MG PO TABS
500.0000 mg | ORAL_TABLET | Freq: Two times a day (BID) | ORAL | Status: DC | PRN
  Administered 2022-01-10 – 2022-01-22 (×7): 500 mg via ORAL
  Filled 2022-01-08 (×8): qty 1

## 2022-01-08 NOTE — Progress Notes (Signed)
Speech Language Pathology Weekly Progress and Session Note  Patient Details  Name: Brandon [name] MRN: [id number] Date of Birth: [birth year]  Beginning of progress report period: January 01, 2022 End of progress report period: January 08, 2022  Today's Date: 01/08/2022 SLP Individual Time: 1400-1430 SLP Individual Time Calculation (min): 30 min and Today's Date: 01/08/2022 SLP Missed Time: 30 Minutes Missed Time Reason: Patient fatigue  Short Term Goals: Week 1: SLP Short Term Goal 1 (Week 1): Pt will sustain his attention to basic, familiar tasks for 3-5 minute intervals wtih mod cues for redirection. SLP Short Term Goal 1 - Progress (Week 1): Not met SLP Short Term Goal 2 (Week 1): Pt will utilize external aids to recall daily information with mod cues. SLP Short Term Goal 2 - Progress (Week 1): Not met SLP Short Term Goal 3 (Week 1): Pt will initiate functional communication with mod assist. SLP Short Term Goal 3 - Progress (Week 1): Met SLP Short Term Goal 4 (Week 1): Pt will utilize an increased vocal intensity to achieve intelligibility at the sentence level with mod assist. SLP Short Term Goal 4 - Progress (Week 1): Met    New Short Term Goals: Week 2: SLP Short Term Goal 1 (Week 2): Pt will sustain his attention to basic, familiar tasks for 3-5 minute intervals wtih mod cues for redirection. SLP Short Term Goal 2 (Week 2): Pt will utilize external aids to recall orientation information with mod verbal and visual cues. SLP Short Term Goal 3 (Week 2): Patient will self-monitor and correct errors at the word level during a structured language task with Mod A multimodal cues. SLP Short Term Goal 4 (Week 2): Patient will verbally express his basic wants/needs at the phrase level with Mod A multimodal cues.  Weekly Progress Updates: Patient has made slow but functional gains and has met 2 of 4 STGs this reporting period. Currently, patient continues to demonstrate behaviors consistent  with a [place]o Level V and requires overall Max A multimodal cues to complete functional and familiar tasks safely in regards to sustained attention, recall with use of external aids and functional problem solving. Patient also demonstrates a moderate expressive aphasia with impaired word-finding with difficulty expression his wants/needs at the phrase level. Patient's intelligibility is also reduced due to a low vocal intensity. Patient and family education ongoing. Patient would benefit from continued skilled SLP intervention to maximize his cognitive-linguistic functioning prior to discharge.      Intensity: Minumum of 1-2 x/day, 30 to 90 minutes Frequency: 3 to 5 out of 7 days Duration/Length of Stay: 01/23/22 Treatment/Interventions: Cognitive remediation/compensation;Cueing hierarchy;Speech/Language facilitation;Functional tasks;Internal/external aids;Patient/family education;Dysphagia/aspiration precaution training   Daily Session  Skilled Therapeutic Interventions:  Skilled treatment session focused on cognitive-linguistic goals. Patient received from OT and appeared lethargic. SLP facilitated session by providing Max A multimodal cues for focused attention for 60 seconds during a functional reading task. Max A multimodal cues were also needed for problem solving throughout task. Patient with increased restlessness and confusion today and constantly repositioning himself in the wheelchair and asking to return home. Patient also attempting to consistently remove the microphone from SLP's shirt. Due to fatigue, patient with increased difficulty expressing wants/needs but reported fatigue and lethargy. Patient transferred back to bed via the [facility] with +2 assist for safety. Patient with posterior lean once sitting EOB and required total A to safely return to supine. Due to fatigue, patient missed remaining 30 minutes of session. Patient left supine in bed with alarm  on and all needs within reach.  Continue with current plan of care.     Pain No/Denies Pain   Therapy/Group: Individual Therapy  PAYNE, COURTNEY 01/08/2022, 2:46 PM

## 2022-01-08 NOTE — Progress Notes (Signed)
PROGRESS NOTE   Subjective/Complaints:  Sleep much improved overnight per wife with increased Trazadone. Had 1 moment of cunfusion yesterday where he "said his son was dead", but otherwise has been cognitively intact.  Endorses ongoing headaches, daily, intermittent, 4/10, no exacerbating or remitting factors. Unchanged from Friday with fioricet.   ROS: Patient denies fever, rash, sore throat, blurred vision, dizziness, nausea, vomiting, diarrhea, cough, shortness of breath or chest pain   Objective:  Recent Labs    01/06/22 1506  WBC 7.4  HGB 13.2  HCT 39.0  PLT 236    Recent Labs    01/06/22 1506 01/08/22 0624  NA 136 139  K 3.3* 3.7  CL 101 105  CO2 26 27  GLUCOSE 237* 141*  BUN 15 20  CREATININE 1.06 1.00  CALCIUM 9.0 9.1      Intake/Output Summary (Last 24 hours) at 01/08/2022 0902 Last data filed at 01/07/2022 1916 Gross per 24 hour  Intake 417 ml  Output --  Net 417 ml        Physical Exam: Vital Signs Blood pressure 114/74, pulse 94, temperature 98.1 F (36.7 C), resp. rate 16, height 6' 6.5" (1.994 m), weight 107.8 kg, SpO2 99 %.  General: Alert and oriented to self, place, event, and time. HEENT: ERRLA, EOMI, sclera anicteric, oral mucosa pink and moist, dentition intact, ext ear canals clear. + HOH, b/l hearing aides.  Neck: Supple without JVD or lymphadenopathy Heart: Reg rate and rhythm. No murmurs rubs or gallops Chest: CTA bilaterally without wheezes, rales, or rhonchi; no distress, good air movement Abdomen: Soft, non-tender, non-distended, bowel sounds positive. Extremities: No clubbing, cyanosis, or edema. Psych: Pt's affect is appropriate. Pt is cooperative. Skin: crani incisions cdi, s/p staple removal.   Neuro: CN 2-12 grossly intact. Follows all commands, answers questions appropriately.  Musculoskeletal: Moves all 4 extremities antigravity.    Assessment/Plan: 1.  Functional deficits which require 3+ hours per day of interdisciplinary therapy in a comprehensive inpatient rehab setting. Physiatrist is providing close team supervision and 24 hour management of active medical problems listed below. Physiatrist and rehab team continue to assess barriers to discharge/monitor patient progress toward functional and medical goals  Care Tool:  Bathing    Body parts bathed by patient: Face, Front perineal area   Body parts bathed by helper: Right arm, Left arm, Chest, Abdomen, Buttocks, Right upper leg, Left upper leg, Right lower leg, Left lower leg     Bathing assist Assist Level: Maximal Assistance - Patient 24 - 49%     Upper Body Dressing/Undressing Upper body dressing   What is the patient wearing?: Pull over shirt    Upper body assist Assist Level: Maximal Assistance - Patient 25 - 49%    Lower Body Dressing/Undressing Lower body dressing      What is the patient wearing?: Pants, Incontinence brief     Lower body assist Assist for lower body dressing: 2 Helpers     Toileting Toileting    Toileting assist Assist for toileting: Total Assistance - Patient < 25%     Transfers Chair/bed transfer  Transfers assist  Chair/bed transfer activity did not occur: Safety/medical concerns  Chair/bed  transfer assist level: Moderate Assistance - Patient 50 - 74%     Locomotion Ambulation   Ambulation assist      Assist level: Moderate Assistance - Patient 50 - 74% Assistive device: Walker-rolling Max distance: 6   Walk 10 feet activity   Assist     Assist level: Moderate Assistance - Patient - 50 - 74%     Walk 50 feet activity   Assist Walk 50 feet with 2 turns activity did not occur: Safety/medical concerns         Walk 150 feet activity   Assist Walk 150 feet activity did not occur: Safety/medical concerns         Walk 10 feet on uneven surface  activity   Assist Walk 10 feet on uneven surfaces activity  did not occur: Safety/medical concerns         Wheelchair     Assist Is the patient using a wheelchair?: Yes (per PT LTGs, pt to propel w/c at DC; note plan for w/c propulsion) Type of Wheelchair: Manual Wheelchair activity did not occur: Safety/medical concerns         Wheelchair 50 feet with 2 turns activity    Assist    Wheelchair 50 feet with 2 turns activity did not occur: Safety/medical concerns       Wheelchair 150 feet activity     Assist  Wheelchair 150 feet activity did not occur: Safety/medical concerns       Blood pressure 114/74, pulse 94, temperature 98.1 F (36.7 C), resp. rate 16, height 6' 6.5" (1.994 m), weight 107.8 kg, SpO2 99 %.  Medical Problem List and Plan: 1. Functional deficits secondary to bilateral SDH's after fall s/p bilateral burr hole evacuation 8/14             -patient may shower             -ELOS/Goals: 12-14 days, supervision to mod I goals with PT, OT, SLP  -CIR therapies including PT, OT, and SLP  2.  Antithrombotics: -DVT/anticoagulation:  Mechanical:  Antiembolism stockings, knee (TED hose) Bilateral lower extremities Sequential compression devices, below knee Bilateral lower extremities             - CT head 8/24 stable -> Lovenox 40 IM mg daily for DVT prophy 8/25             -antiplatelet therapy: n/a 3. Pain Management/headache: ongoing             -Tylenol 1000 mg TID scheduled and DC hydrocodone 8/22 -> Fioricet 1 tab TID + Tylenol 500 mg BID PRN for HA, pain 8/25 -> Fioricet/tylenol PRN 8/28            - voltaren gel to b/l knees 4 g 8/25            - Gabapentin 100 mg BID for persistent HA started 8/28 4. Mood/Behavior/Sleep: trazodone prn for sleep - not using             -pt seems fairly up beat and wife is very supportive             -antipsychotic agents: n/a 5. Neuropsych/cognition: This patient is not quite capable of making decisions on his own behalf. 6. Skin/Wound Care: local care to scalp, removed  staples 8/25   7. Fluids/Electrolytes/Nutrition: encourage appropriate po intake             -liberalized him to regular diet to help with po intake. Encouraged wife  to bring food from home as well. Poor po intake so far however.              -check labs Monday - stable  8. Seizure prophylaxis: keppra '500mg'$  q 12 hours -> DC 8/23 9. Hx of Prostate Cancer, c/b urinary incontinence - ongoing             -flomax 0.'4mg'$  bid, proscar '5mg'$  daily              -emptying with intermittent incontinence thus far              - UA 8/23 without s/s infection             - Discussed incontinence of bowel and bladder with family in detail 8/25; they feel he is now close to his baseline, which is intermittently incontinent of both                  10. HTN: well controlled             -hctz/lisinopril daily per home regimen; monitor for orthostasis  11. Osteoarthritis bilateral knees, R>L             -voltaren gel to both knees - increased 8/25               - Discussing RLE bracing with PT for support and sensory feedback 8/25             -his knee pain certainly was contributing to his recent falls and balance deficits in the months prior to this admit    12. Delirium, likely multifactorial etiology (post-hospitalization, polypharmacy) - improved - Ensure wearing hearing aides; wife has microphone available to amplify  - UA 8/23 stable; CBC 8/21-22 stable, no leukocytosis - Delirium precautions added and discussed with family - Keppra may be contributing, DC 8/23  - repeat CT head  - ordered 8/24 - stable - initiate sleep log 8/24  - 8/26 Altered sleep wake cycle- trazodone increase trazodone to '75mg'$  for sleep - improved  13.  Constipation - PRN suppository 8/27, with results - Daily Miralax; added Senn[place]-S 1 TAB QHS 8/28  Hypokalemia: resolved -K+ 3.3 , KCL supplement -> 3.7 8/28  LOS: 9 days A FACE TO FACE EVALUATION WAS PERFORMED  Est Dispo: Tuesday, Sept 12th to home  Gertie Gowda 01/08/2022, 9:02 AM

## 2022-01-08 NOTE — Progress Notes (Signed)
Occupational Therapy Weekly Progress Note  Patient Details  Name: Brandon [name] MRN: [id number] Date of Birth: Oct 05, 1945  Beginning of progress report period: December 31, 2021 End of progress report period: January 08, 2022  Today's Date: 01/08/2022 OT Individual Time: 1300-1400 OT Individual Time Calculation (min): 60 min    Patient has met 1 of 4 short term goals.  Patient is making slow, but steady progress towards OT goals. Pt has demonstrated improved initiation and problem solving within UB bathing/dressing tasks, but still has poor sitting and standing balance with posterior and lateral lean to the R. Pt has progressed well with Stedy transfers and can do this with as little as min A, but still has difficulty with motor planning transitional movements. Pt is also very anxious with standing and transfers as well. Continue current POC.   Patient continues to demonstrate the following deficits: muscle weakness, decreased cardiorespiratoy endurance, impaired timing and sequencing, unbalanced muscle activation, motor apraxia, ataxia, decreased coordination, and decreased motor planning, decreased midline orientation and decreased motor planning, decreased initiation, decreased attention, decreased awareness, decreased problem solving, decreased safety awareness, decreased memory, and delayed processing, and decreased sitting balance, decreased standing balance, decreased postural control, and decreased balance strategies and therefore will continue to benefit from skilled OT intervention to enhance overall performance with BADL and Reduce care partner burden.  Patient progressing toward long term goals..  Continue plan of care.  OT Short Term Goals Week 1:  OT Short Term Goal 1 (Week 1): Pt will demonstrate improved sitting balance and activity tolerance while completing self care tasks seated on EOB with Min A to maintain balance OT Short Term Goal 1 - Progress (Week 1): Progressing toward  goal OT Short Term Goal 2 (Week 1): Pt will complete sit to stand with RW with Mod Assist x1. OT Short Term Goal 2 - Progress (Week 1): Progressing toward goal OT Short Term Goal 3 (Week 1): Pt will increase UB dressing to Min Assist OT Short Term Goal 3 - Progress (Week 1): Met OT Short Term Goal 4 (Week 1): Pt will increase toilet transfer to Mod A x1 using RW OT Short Term Goal 4 - Progress (Week 1): Progressing toward goal Week 2:  OT Short Term Goal 1 (Week 2): Pt will increase toilet transfer to Mod A x1 using RW OT Short Term Goal 2 (Week 2): Pt will complete sit to stand with RW with Mod Assist x1. OT Short Term Goal 3 (Week 2): Marland KitchenPt will demonstrate improved sitting balance and activity tolerance while completing self care tasks seated on EOB with Min A to maintain balance  Skilled Therapeutic Interventions/Progress Updates:    Pt greeted semi-reclined in bed with spouse present. Spouse reported pt slept really well last night and has had a good day so far. Pt agreeable to shower today. Hearing aids removed for shower which made communication more difficult. Stedy transfer to shower with only min A to stand, but lateral lean to the R when seated on Stedy.  Pt with strong lean to the R in shower requiring max A and multimodal cues to get pt to sit at midline. Once sitting balance achieved. He could maintain with intermittent mod A while bathing. Pt initiated bathing tasks well with only minimal cues for thoroughness. OT assist to was lower body for safety and lateral lean to wash buttocks. Dressing tasks from wc at the sink with pt able to initiate reaching to thread pant legs, but became anxious about leaning  forward requiring OT assist. Sit<>stands at the sink with max A +2 as pt perseverating on pulling pants up instead of standing first. Pt did a great job initiating shirt today and only needed min A with cues for orientation. Pt brought to therapy gym and worked on problem solving task with 3  color vertical pattern. Pt able to name the colors in the pattern, but despite max cues, could not replicate the pattern. Pt handoff to SLP for next therapy session.   Therapy Documentation Precautions:  Precautions Precautions: Fall Restrictions Weight Bearing Restrictions: No  Pain:  Denies pain at this time   Therapy/Group: Individual Therapy  Valma Cava 01/08/2022, 2:33 PM

## 2022-01-08 NOTE — Progress Notes (Signed)
Physical Therapy Session Note  Patient Details  Name: Brandon [name] MRN: [id number] Date of Birth: [birth year]  Today's Date: 01/08/2022 PT Individual Time: 0881-1031 PT Individual Time Calculation (min): 28 min   Short Term Goals: Week 1:  PT Short Term Goal 1 (Week 1): Pt will transfer sup to sit w/ min A. PT Short Term Goal 2 (Week 1): Pt will transfer sit to stand w/ min A PT Short Term Goal 3 (Week 1): Pt will amb x 50' w/ RW and min A PT Short Term Goal 4 (Week 1): Pt will assess stairs.  Skilled Therapeutic Interventions/Progress Updates:   Received pt semi-reclined in bed with wife present at bedside. Pt agreeable to PT treatment and denied any pain during session. Of note, pt very HOH and with poor motor planning/sequencing throughout session. Session with emphasis on functional mobility/transfers, dressing, and generalized strengthening and endurance. Pt transferred semi-reclined<>sitting EOB with HOB elevated and use of bedrails with mod A (for trunk control) and max cues for sequencing. Pt required increased time and max multimodal cues to scoot to EOB. Pt stood from elevated EOB x 4 reps with RW and mod A +2 fading to mod A of 1 - pt unable to follow through with cues for hand placement on RW/bed. Doffed dirty brief and donned clean one in standing with +2 assist then donned shorts in sitting with max A - pt was able to assist in pulling pants over hips in standing. Pt transferred bed<>TIS WC with RW and mod A +2, however pt with poor safety awareness with transfer, sitting before backing up to chair and upon sitting down, L hip sliding forward. Pt able to reposition in chair without assist but required increased time and max cues. Donned socks and shoes with max A - however pt was able to remove non-skid sock without assist and with increased time. Concluded session with pt sitting in TIS WC, needs within reach, and seatbelt alarm on. Wife present at bedside.   Therapy  Documentation Precautions:  Precautions Precautions: Fall Restrictions Weight Bearing Restrictions: No  Therapy/Group: Individual Therapy Alfonse Alpers PT, DPT  01/08/2022, 7:00 AM

## 2022-01-08 NOTE — Progress Notes (Signed)
Physical Therapy Weekly Progress Note  Patient Details  Name: Brandon [name] MRN: [id number] Date of Birth: 1946-04-26  Beginning of progress report period: December 31, 2021 End of progress report period: January 08, 2022  Today's Date: 01/08/2022 PT Individual Time: 4098-1191 PT Individual Time Calculation (min): 69 min   Patient has met 0 of 4 short term goals.  Pt is progressing slowly toward mobility goals secondary to difficulty with consistent participation, as well as inconsistent presentation and initiation. Pt performing bed mobility consistently at [place] level, transfers from bed to chair with modA +2, and requires maxA +2 and +3 for WC follow for ambulation with RW.  Patient continues to demonstrate the following deficits muscle weakness, decreased cardiorespiratoy endurance, decreased coordination and decreased motor planning, decreased motor planning, decreased initiation, decreased attention, decreased awareness, decreased problem solving, decreased safety awareness, decreased memory, and delayed processing, and decreased sitting balance, decreased standing balance, decreased postural control, and decreased balance strategies and therefore will continue to benefit from skilled PT intervention to increase functional independence with mobility.  Patient progressing toward long term goals..  Continue plan of care.  PT Short Term Goals Week 1:  PT Short Term Goal 1 (Week 1): Pt will transfer sup to sit w/ min A. PT Short Term Goal 1 - Progress (Week 1): Progressing toward goal PT Short Term Goal 2 (Week 1): Pt will transfer sit to stand w/ min A PT Short Term Goal 2 - Progress (Week 1): Progressing toward goal PT Short Term Goal 3 (Week 1): Pt will amb x 50' w/ RW and min A PT Short Term Goal 3 - Progress (Week 1): Progressing toward goal PT Short Term Goal 4 (Week 1): Pt will assess stairs. PT Short Term Goal 4 - Progress (Week 1): Progressing toward goal Week 2:  PT Short Term  Goal 1 (Week 2): Pt will complete bed mobility consistently with minA. PT Short Term Goal 2 (Week 2): Pt will complete sit to stand transfer consistently with minA. PT Short Term Goal 3 (Week 2): Pt will perform bed to chair transfer consistently with minA. PT Short Term Goal 4 (Week 2): Pt will ambulate x50' with modA +2 and LRAD.  Skilled Therapeutic Interventions/Progress Updates:  Ambulation/gait training;Discharge planning;Functional mobility training;Therapeutic Activities;UE/LE Strength taining/ROM;Wheelchair propulsion/positioning;UE/LE Coordination activities;Therapeutic Exercise;Stair training;Patient/family education;Neuromuscular re-education;Community reintegration   Pt received seated in [facility] and agrees to therapy. Reports some pain in R knee. PT ace wraps R knee to provide compression and pain relief and reports some improvement following wrapping. WC transport to gym for time management. Pt performs transfer training and standing activities with mirror for visual feedback. Pt holds onto parallel bar with both hands, facing mirror. Sit to stand multiple times throughout session, requiring varying levels of assistance, from modA +2 to modA +1, depending on initiation and motor planning. PT provides cues for anterior weight shift and hand placement, as well as initiation. Pt achieves upright standing with much improved posture and lower extremity extension than previous sessions with this therapist. Pt endurance is very limited, however, and only remains standing up to 30 seconds at a time prior to requiring seated rest break. Pt attempts sidestepping in either direction and is able to shuffle several inches each way, but again sits down quickly, verbalizing fatigue. Extended seated rest breaks required between each bout of standing. Pt then performed targeted toe tapping to encourage increased lateral weight shifting as well as stance time on each foot. Pt able to perform but typically  only able  to perform x4 taps prior to requiring rest break. Pt also repeatedly requests to return to room. PT attempts redirection and gentle encouragement, but pt insistent on returning to room. WC transport back to room. Squat pivot transfer to bed with heavy modA and cues for foot placement, hand positioning, and sequencing. Sit to supine with modA due to heavy posterior lean and difficulty with sequencing. Left supine with alarm intact and all needs within reach.  Therapy Documentation Precautions:  Precautions Precautions: Fall Restrictions Weight Bearing Restrictions: No   Therapy/Group: Individual Therapy  Breck Coons, PT, DPT 01/08/2022, 4:38 PM

## 2022-01-09 MED ORDER — METHYLPHENIDATE HCL 5 MG PO TABS
5.0000 mg | ORAL_TABLET | Freq: Two times a day (BID) | ORAL | Status: DC
Start: 2022-01-10 — End: 2022-01-11
  Administered 2022-01-10 – 2022-01-11 (×3): 5 mg via ORAL
  Filled 2022-01-09 (×3): qty 1

## 2022-01-09 MED ORDER — SENNOSIDES-DOCUSATE SODIUM 8.6-50 MG PO TABS
1.0000 | ORAL_TABLET | Freq: Two times a day (BID) | ORAL | Status: DC
  Administered 2022-01-09 – 2022-01-14 (×9): 1 via ORAL
  Filled 2022-01-09 (×12): qty 1

## 2022-01-09 MED ORDER — POLYETHYLENE GLYCOL 3350 17 G PO PACK
17.0000 g | PACK | Freq: Once | ORAL | Status: AC
Start: 2022-01-09 — End: 2022-01-09
  Administered 2022-01-09: 17 g via ORAL
  Filled 2022-01-09: qty 1

## 2022-01-09 MED ORDER — BUTALBITAL-APAP-CAFFEINE 50-325-40 MG PO TABS
1.0000 | ORAL_TABLET | Freq: Three times a day (TID) | ORAL | Status: DC
  Administered 2022-01-09 – 2022-01-18 (×26): 1 via ORAL
  Filled 2022-01-09 (×27): qty 1

## 2022-01-09 MED ORDER — TRAZODONE HCL 50 MG PO TABS
100.0000 mg | ORAL_TABLET | Freq: Every day | ORAL | Status: DC
  Administered 2022-01-09 – 2022-01-11 (×3): 100 mg via ORAL
  Filled 2022-01-09 (×3): qty 2

## 2022-01-09 NOTE — Progress Notes (Addendum)
PROGRESS NOTE   Subjective/Complaints:  Patient seen and evaluated at bedside this a.m., working with SLP.  Endorses ongoing intermittent headache, 4 out of 10, unchanged from prior to starting gabapentin. On chart review, has not been receiving PRNs for HA.   SLP also notes increased lethargy and worse concentration since initiating gabapentin, and notes overall the patient has had poorer attention and increased confusion since Friday of last week.   Patient endorses his last bowel movement 3 days prior, is passing flatus but starting to feel constipated.  Urinary incontinence ongoing, unchanged.    ROS: Patient denies fever, rash, sore throat, blurred vision, dizziness, nausea, vomiting, diarrhea, cough, shortness of breath or chest pain   Objective:  Recent Labs    01/06/22 1506  WBC 7.4  HGB 13.2  HCT 39.0  PLT 236    Recent Labs    01/06/22 1506 01/08/22 0624  NA 136 139  K 3.3* 3.7  CL 101 105  CO2 26 27  GLUCOSE 237* 141*  BUN 15 20  CREATININE 1.06 1.00  CALCIUM 9.0 9.1      Intake/Output Summary (Last 24 hours) at 01/09/2022 1011 Last data filed at 01/09/2022 0930 Gross per 24 hour  Intake 497 ml  Output --  Net 497 ml        Physical Exam: Vital Signs Blood pressure (!) 147/98, pulse 87, temperature 98 F (36.7 C), resp. rate 16, height 6' 6.5" (1.994 m), weight 107.8 kg, SpO2 96 %.  General: Alert and oriented to self, place, event, and time. HEENT: ERRLA, EOMI, sclera anicteric, oral mucosa pink and moist, dentition intact, ext ear canals clear. + HOH, b/l hearing aides.  Neck: Supple without JVD or lymphadenopathy Heart: Reg rate and rhythm. No murmurs rubs or gallops Chest: CTA bilaterally without wheezes, rales, or rhonchi; no distress, good air movement Abdomen: Soft, non-tender, non-distended, bowel sounds positive. Extremities: No clubbing, cyanosis, or edema. Psych: Pt's affect  is appropriate. Pt is cooperative. Skin: crani incisions cdi, s/p staple removal.   Neuro: CN 2-12 grossly intact. Follows all commands, answers questions appropriately with intermittent confusion (?d/t HOH) Musculoskeletal: Moves all 4 extremities antigravity in bed and against resistance.    Assessment/Plan: 1. Functional deficits which require 3+ hours per day of interdisciplinary therapy in a comprehensive inpatient rehab setting. Physiatrist is providing close team supervision and 24 hour management of active medical problems listed below. Physiatrist and rehab team continue to assess barriers to discharge/monitor patient progress toward functional and medical goals  Care Tool:  Bathing    Body parts bathed by patient: Face, Front perineal area   Body parts bathed by helper: Right arm, Left arm, Chest, Abdomen, Buttocks, Right upper leg, Left upper leg, Right lower leg, Left lower leg     Bathing assist Assist Level: Maximal Assistance - Patient 24 - 49%     Upper Body Dressing/Undressing Upper body dressing   What is the patient wearing?: Pull over shirt    Upper body assist Assist Level: Maximal Assistance - Patient 25 - 49%    Lower Body Dressing/Undressing Lower body dressing      What is the patient wearing?: Pants, Incontinence  brief     Lower body assist Assist for lower body dressing: 2 Helpers     Toileting Toileting    Toileting assist Assist for toileting: Total Assistance - Patient < 25%     Transfers Chair/bed transfer  Transfers assist  Chair/bed transfer activity did not occur: Safety/medical concerns  Chair/bed transfer assist level: 2 Helpers     Locomotion Ambulation   Ambulation assist      Assist level: Moderate Assistance - Patient 50 - 74% Assistive device: Walker-rolling Max distance: 6   Walk 10 feet activity   Assist     Assist level: Moderate Assistance - Patient - 50 - 74%     Walk 50 feet activity   Assist  Walk 50 feet with 2 turns activity did not occur: Safety/medical concerns         Walk 150 feet activity   Assist Walk 150 feet activity did not occur: Safety/medical concerns         Walk 10 feet on uneven surface  activity   Assist Walk 10 feet on uneven surfaces activity did not occur: Safety/medical concerns         Wheelchair     Assist Is the patient using a wheelchair?: Yes (per PT LTGs, pt to propel w/c at DC; note plan for w/c propulsion) Type of Wheelchair: Manual Wheelchair activity did not occur: Safety/medical concerns         Wheelchair 50 feet with 2 turns activity    Assist    Wheelchair 50 feet with 2 turns activity did not occur: Safety/medical concerns       Wheelchair 150 feet activity     Assist  Wheelchair 150 feet activity did not occur: Safety/medical concerns       Blood pressure (!) 147/98, pulse 87, temperature 98 F (36.7 C), resp. rate 16, height 6' 6.5" (1.994 m), weight 107.8 kg, SpO2 96 %.  Medical Problem List and Plan: 1. Functional deficits secondary to bilateral SDH's after fall s/p bilateral burr hole evacuation 8/14             -patient may shower             -ELOS/Goals: 12-14 days, supervision to mod I goals with PT, OT, SLP  -CIR therapies including PT, OT, and SLP  2.  Antithrombotics: -DVT/anticoagulation:  Mechanical:  Antiembolism stockings, knee (TED hose) Bilateral lower extremities Sequential compression devices, below knee Bilateral lower extremities             - CT head 8/24 stable -> Lovenox 40 IM mg daily for DVT prophy 8/25             -antiplatelet therapy: n/a 3. Pain Management/headache: ongoing             -Tylenol 1000 mg TID scheduled and DC hydrocodone 8/22 -> Fioricet 1 tab TID + Tylenol 500 mg BID PRN for HA, pain 8/25 -> Fioricet/tylenol PRN 8/28; restart scheduled as not getting PRNs despite ongoing HA 8/29            - voltaren gel to b/l knees 4 g 8/25            - Gabapentin  100 mg BID for persistent HA started 8/28 -> DC 8/29 d/t worsening cognition 4. Mood/Behavior/Sleep:              -pt seems fairly up beat and wife is very supportive             -  antipsychotic agents: n/a                 5. Neuropsych/cognition: This patient is not quite capable of making decisions on his own behalf.            - Given persistent slow processing and attention despite delirum workup, in setting of TBI, will trial Ritalin 5 mg BID 8/29 to improve arousal and participation  6. Skin/Wound Care: local care to scalp, removed staples 8/25    7. Fluids/Electrolytes/Nutrition: encourage appropriate po intake             -liberalized him to regular diet to help with po intake. Encouraged wife to bring food from home as well. Poor po intake so far however.              -check labs Monday - stable  8. Seizure prophylaxis: keppra '500mg'$  q 12 hours -> DC 8/23 9. Hx of Prostate Cancer, c/b urinary incontinence - ongoing             -flomax 0.'4mg'$  bid, proscar '5mg'$  daily              -emptying with intermittent incontinence thus far              - UA 8/23 without s/s infection             - Discussed incontinence of bowel and bladder with family in detail 8/25; they feel he is now close to his baseline, which is intermittently incontinent of both                  10. HTN: well controlled             -hctz/lisinopril daily per home regimen; monitor for orthostasis  11. Osteoarthritis bilateral knees, R>L             -voltaren gel to both knees - increased 8/25               - Discussing RLE bracing with PT for support and sensory feedback 8/25             -his knee pain certainly was contributing to his recent falls and balance deficits in the months prior to this admit    12. Delirium, likely multifactorial etiology (post-hospitalization, polypharmacy, TBI) - stable - Ensure wearing hearing aides; wife has microphone available to amplify  - UA 8/23 stable; CBC 8/21-22 stable, no  leukocytosis - Delirium precautions added and discussed with family - Keppra may be contributing, DC 8/23  - repeat CT head  - ordered 8/24 - stable - initiate sleep log 8/24 - reinforce 8/29, last log 8/26  - 8/26 Altered sleep wake cycle- trazodone increase trazodone to '75mg'$  for sleep-> 100 mg 8/29 d/t ongoing use of PRN melatonin despite endorsed good sleep - DC PRN compazine and zofran, not using and has cog s/e  13.  Constipation - PRN suppository 8/27, with results - Daily Miralax + 1x dose 8/29 - Sennakot-S 1 TAB QHS 8/28 -> BID 8/29 - PRN suppository available if no results  Hypokalemia: resolved -K+ 3.3 , KCL supplement -> 3.7 8/28  LOS: 10 days A FACE TO FACE EVALUATION WAS PERFORMED  Est Dispo: Tuesday, Sept 12th to home with family assistance  Gertie Gowda 01/09/2022, 10:11 AM

## 2022-01-09 NOTE — Progress Notes (Signed)
Physical Therapy Session Note  Patient Details  Name: Brandon [name] MRN: [id number] Date of Birth: 07-25-1945  Today's Date: 01/09/2022 PT Individual Time: 1105-1200 and 8338-2505 PT Individual Time Calculation (min): 55 min and 70 min  Short Term Goals: Week 2:  PT Short Term Goal 1 (Week 2): Pt will complete bed mobility consistently with minA. PT Short Term Goal 2 (Week 2): Pt will complete sit to stand transfer consistently with minA. PT Short Term Goal 3 (Week 2): Pt will perform bed to chair transfer consistently with minA. PT Short Term Goal 4 (Week 2): Pt will ambulate x50' with modA +2 and LRAD.  Skilled Therapeutic Interventions/Progress Updates:     Pt received supine in bed asleep. Easily roused with verbal and tactile stimuli. Pt performs supine to sit with cues for sequencing and modA HHA to facilitate logrolling. Pt requires intermittent modA for static sitting balance at EOB while PT dons shoes. Sit to stand from elevated bed with minA and cues for initiation and placement. Stedy transfer to [facility] with [place]. WC transport to gym for time management. Pt performs lateral scoot transfer to the L from [facility] to mat with minA and much improved initiation and motor planning from pt. Pt participates in ball toss/rebound/catch activity with trampoline, while seated, to challenge attention to task, sitting balance, anticipatory postural adjustments, and to encourage upright sitting due to pt's tendency for posterior lean. Pt completes 3x10 with occasional cues to remain on task, as well as cues to utilize power and to optimize posture. Pt performs 1x10 with overhead throw to facilitate increased trunk extension. Following rest break, pt performs reaching activity with platform placed between pt's legs to provide pt with visual cue to increase confidence to lean forward to reach objects far out in front of center of gravity. Pt reaches for ball without difficulty with R upper extremity but is  tentative and unable to reach forward with L hand, despite cueing. Pt leans to the L and reaches for ball on L side with L hand without difficulty and returns to midline with CGA. Pt then ambulates 2x15' with RW. Pt requires minA progressing to modA/maxA with fatigue. +2 required for close WC follow. For each bout, pt's first 2-3 steps have much improved mechanics with upright posture and lower extremity extension, but pt then reverts to very forward flexed posture with knees and hips flexed throughout gait cycle. Unable to substantially correct despite heavy cueing. WC transport back to room. Stedy transfer back to bed with minA overall, with cues for hand placement and sequencing. ModA for sit to supine. Left with alarm intact and all needs within reach.   2nd Session: Pt received supine in bed asleep. Easily roused with verbal and tactile stimuli. Agreeable to therapy and no complaint of pain. Supine to sit with modA HHA and cues for positioning and sequencing. Pt verbalizes need to urinate. Sit to stand in stedy with minA from elevated bed, with cues for hand placement and body mechanics. Pt performs transfer to toilet with Stedy and modA +2 to eccentrically lower to low toilet height. Pt positive for flatulence but does not urinate. MaxA +2 for sit to stand from toilet with Stedy and cues for initiation and sequencing. Pt has difficulty extending hips, likely associated with apraxia, and requires max verbal cueing and manual facilitation of hip extension for safety. Transfer back to [facility] with Stedy. WC transport to gym for time management. Pt performs standing activity in standing frame to promote activity  tolerance, bilateral lower extremity strength, core strength, and attention to task. Pt stands with cue to retrieve "Squigz" placed on window anterior to pt just above eye level. This cue works well for pt to achieve more upright posture and engage din activity to increase time on feet. Pt is able to  retrieve all 14 squiz during one bout of standing, but requires immediate and extended seated rest break. Pt performs activity again with good trunk extension and attention to task, but has some difficulty motor planning stand to sit, requiring mod cueing to complete. Pt takes seated rest break and then without cueing from PT, stands to frame without having to use harness and retrieves 5 out of 14 squigz prior to requiring seated rest break. Pt takes extended seated rest break prior to standing final time with minA to retrieve final 9 squigz. WC transport back to room. Stedy transfer back to bed with minA overall, with cues for hand placement and sequencing. ModA for sit to supine. Left with alarm intact and all needs within reach.  Therapy Documentation Precautions:  Precautions Precautions: Fall Restrictions Weight Bearing Restrictions: No   Therapy/Group: Individual Therapy  Breck Coons, PT, DPT 01/09/2022, 4:45 PM

## 2022-01-09 NOTE — Progress Notes (Signed)
Patient ID: Brandon [name], male   DOB: 18-Mar-1946, 76 y.o.   MRN: [id number]  SW met with pt wife and pt son in room while pt sleeping at time of visit. SW provided updates from team conference, and d/c date remains 9/12. SW discussed will schedule family edu closer towards discharge. SW will continue to provide updates as they are available.   Loralee Pacas, MSW, [place] Office: [phone] Cell: [phone] Fax: [phone]

## 2022-01-09 NOTE — Progress Notes (Signed)
Speech Language Pathology TBI Note  Patient Details  Name: Brandon [name] MRN: [id number] Date of Birth: [birth year]  Today's Date: 01/09/2022 SLP Individual Time: 0917-1003 SLP Individual Time Calculation (min): 46 min  Short Term Goals: Week 2: SLP Short Term Goal 1 (Week 2): Pt will sustain his attention to basic, familiar tasks for 3-5 minute intervals wtih mod cues for redirection. SLP Short Term Goal 2 (Week 2): Pt will utilize external aids to recall orientation information with mod verbal and visual cues. SLP Short Term Goal 3 (Week 2): Patient will self-monitor and correct errors at the word level during a structured language task with Mod A multimodal cues. SLP Short Term Goal 4 (Week 2): Patient will verbally express his basic wants/needs at the phrase level with Mod A multimodal cues.  Skilled Therapeutic Interventions: Skilled treatment session focused on cognitive-linguistic goals. Upon arrival, patient was awake in bed and impulsivity attempting to sit EOB. Patient had been incontinent of urine with minimal awareness and patient required Mod verbal cues to initiate bed mobility to donn a new brief and shorts. Patient sat EOB with Min A and was transferred to the wheelchair via the [facility] with +2 assist. Patient was oriented to place "hospital" but required total A for orientation to city, time and situation. Patient with language of confusion with confabulation but was unable to to be redirected despite Max A multimodal cues. Patient participated in basic naming/reading comprehension tasks with 100% accuracy with Min verbal cues for error awareness. Intermittent word-finding deficits noted throughout conversation with decreased ability to self-correct. Patient left upright in the wheelchair with the alarm on and son present. Continue with current plan of care.      Pain No/Denies Pain   Agitated Behavior Scale: TBI Observation Details Observation Environment: CIR Start of  observation period - Date: 01/09/22 Start of observation period - Time: 0917 End of observation period - Date: 01/09/22 End of observation period - Time: 1003 Agitated Behavior Scale (DO NOT LEAVE BLANKS) Short attention span, easy distractibility, inability to concentrate: Present to a slight degree Impulsive, impatient, low tolerance for pain or frustration: Present to a slight degree Uncooperative, resistant to care, demanding: Present to a slight degree Violent and/or threatening violence toward people or property: Absent Explosive and/or unpredictable anger: Absent Rocking, rubbing, moaning, or other self-stimulating behavior: Absent Pulling at tubes, restraints, etc.: Absent Wandering from treatment areas: Present to a slight degree Restlessness, pacing, excessive movement: Present to a slight degree Repetitive behaviors, motor, and/or verbal: Absent Rapid, loud, or excessive talking: Absent Sudden changes of mood: Absent Easily initiated or excessive crying and/or laughter: Absent Self-abusiveness, physical and/or verbal: Absent Agitated behavior scale total score: 19  Therapy/Group: Individual Therapy  Kei Mcelhiney 01/09/2022, 12:11 PM

## 2022-01-09 NOTE — Patient Care Conference (Signed)
Inpatient RehabilitationTeam Conference and Plan of Care Update Date: 01/09/2022   Time: 10:41 AM    Patient Name: Brandon [name]      Medical Record Number: [id number]  Date of Birth: May 03, 1946 Sex: Male         Room/Bed: 4W08C/4W08C-01 Payor Info: Payor: [employer] MEDICARE / Plan: [place] HMO / Product Type: *No Product type* /    Admit Date/Time:  12/30/2021  1:11 PM  Primary Diagnosis:  Traumatic brain injury with loss of consciousness [facility])  Hospital Problems: Principal Problem:   Traumatic brain injury with loss of consciousness [facility])    Expected Discharge Date: Expected Discharge Date: 01/23/22  Team Members Present: Physician leading conference: Other (comment) (Dr Durel Salts, DO) Social Worker Present: Loralee Pacas, [place] Nurse Present: Other (comment) Tacy Learn, RN) PT Present: Tereasa Coop, PT OT Present: Willeen Cass, OT SLP Present: Weston Anna, SLP PPS Coordinator present : Gunnar Fusi, SLP     Current Status/Progress Goal Weekly Team Focus  Bowel/Bladder   incontinent b/b  regain continence  toilet q 2 hr and prn   Swallow/Nutrition/ Hydration   Regular textures with thin liquids  N/A  N/A   ADL's   Min/mod A UB ADLs, MAX A LB ADLs, improved alertness and initiation  Supervision/min A  self-care retraining, activity tolerance, sit<>stands, transfers, attention, problem solving   Mobility   fluctuating levels of assistance, modA to maxA sit to stand, modA +2 gait up to 27' with RW with +3 for WC follow  Supervision to CGA for gait  increased activity tolerance, transfers, balance, gait training   Communication   Mod A for word-finding  Supervision  use of word-finding strategies, speech intelligibility   Safety/Cognition/ Behavioral Observations  Rancho Level VI-Max A  Min A  attention, initation, functional problem solving, recall with use of external aids   Pain   no reported pain  < 3  assess pain q 4 hr and prn   Skin    incisions healing to head  no new breakdown  assess skin q shift and prn     Discharge Planning:  D/c to home with support from wife, and their son who lives in the home. SW will confirm no barriers to discharge.   Team Discussion: TBI. Rancho 5. Incontinence baseline. LBM 08/26 after suppository. Crani OTA. Infection workup negative. Patient with heavy posterior lean. Therapies limited by pain, cognition, and confusion.  Patient on target to meet rehab goals: Will evaluate at next team meeting. Patient ambulating 40 ft with 3 people.  *See Care Plan and progress notes for long and short-term goals.   Revisions to Treatment Plan:  Monitor labs  Teaching Needs: Medications, safety, behavior, gait/transfer training, etc  Current Barriers to Discharge: Decreased caregiver support, Home enviroment access/layout, and Behavior  Possible Resolutions to Barriers: Family education, medication education, order recommended DME     Medical Summary Current Status: Medically stable  Barriers to Discharge: Behavior;Home enviroment access/layout;Incontinence;Wound care  Barriers to Discharge Comments: cognitive delay, hearing disability, pain Possible Resolutions to Barriers/Weekly Focus: Continue working on cognitive re-orientation and headache management   Continued Need for Acute Rehabilitation Level of Care: The patient requires daily medical management by a physician with specialized training in physical medicine and rehabilitation for the following reasons: Direction of a multidisciplinary physical rehabilitation program to maximize functional independence : Yes Medical management of patient stability for increased activity during participation in an intensive rehabilitation regime.: Yes Analysis of laboratory values and/or radiology reports  with any subsequent need for medication adjustment and/or medical intervention. : Yes   I attest that I was present, lead the team conference, and  concur with the assessment and plan of the team.   Ernest Pine 01/09/2022, 2:04 PM

## 2022-01-10 NOTE — Progress Notes (Signed)
Physical Therapy Session Note  Patient Details  Name: Brandon [name] MRN: [id number] Date of Birth: 12/10/1945  Today's Date: 01/10/2022 PT Individual Time: 1540-1620 PT Individual Time Calculation (min): 40 min   Short Term Goals: Week 2:  PT Short Term Goal 1 (Week 2): Pt will complete bed mobility consistently with minA. PT Short Term Goal 2 (Week 2): Pt will complete sit to stand transfer consistently with minA. PT Short Term Goal 3 (Week 2): Pt will perform bed to chair transfer consistently with minA. PT Short Term Goal 4 (Week 2): Pt will ambulate x50' with modA +2 and LRAD.  Skilled Therapeutic Interventions/Progress Updates:     Pt received seated in WC in dayroom. Handed off to PT from OT. Pt does not complain of pain and appears agreeable to therapy. Pt taken to main gym to perform gait training with shopping cart. After appearing to be agreeable to therapy, pt refuses to perform gait training. PT asks pt what other activities he might prefer and pt does not respond. PT provides max encouragement and education regarding importance of participation in therapy, including highlighting pt's progress thus far in rehab. Pt kicks shopping cart away and says "No fucking way". PT again attempts to redirect pt and provide alternative therapeutic activities and pt continues to refuse, but does demonstrate sustained attention. PT asks pt what he would like do again and pt responds "I want to go home." PT provides education on importance of therapy in order to discharge home safely. Pt suddenly stands up without cueing, initially without assistance but then having posterior LOB requiring modA +2 for safety. Pt ambulates several feet to shopping cart, grabs with both hands, and ambulates x40' with minA/modA +2, with cues for upright gaze to improve posture and balance, and hip, knee, and trunk extension for safety. Pt says that he is attempting to ambulate toward chair in hallway but forward flexion  becomes more pronounced and pt required increased assistance to remain upright, up to maxA +2 while +3 is required to bring WC to pt. Pt sits for extended seated rest break and again refuses to mobilize. PT attempts to utilize 2nd PT to motivate pt to participate, and pt again stands as if to ambulate, but has LOB backward and abruptly sits back in [place]. Pt then kicks shopping cart and verbalizes that he is "done". WC transport back to room. Squat pivot to bed with minA +2 and cues for positioning. Sit to supine with modA and cues for sequencing. Left with alarm intact and all needs within reach.  Therapy Documentation Precautions:  Precautions Precautions: Fall Restrictions Weight Bearing Restrictions: No    Therapy/Group: Individual Therapy  Breck Coons, PT, DPT 01/10/2022, 4:30 PM

## 2022-01-10 NOTE — Progress Notes (Signed)
Speech Language Pathology TBI Note  Patient Details  Name: Brandon [name] MRN: [id number] Date of Birth: 15-Aug-1945  Today's Date: 01/10/2022 SLP Individual Time: 0815-0900 SLP Individual Time Calculation (min): 45 min  Short Term Goals: Week 2: SLP Short Term Goal 1 (Week 2): Pt will sustain his attention to basic, familiar tasks for 3-5 minute intervals wtih mod cues for redirection. SLP Short Term Goal 2 (Week 2): Pt will utilize external aids to recall orientation information with mod verbal and visual cues. SLP Short Term Goal 3 (Week 2): Patient will self-monitor and correct errors at the word level during a structured language task with Mod A multimodal cues. SLP Short Term Goal 4 (Week 2): Patient will verbally express his basic wants/needs at the phrase level with Mod A multimodal cues.  Skilled Therapeutic Interventions: Skilled treatment session focused on cognitive-linguistic goals. Upon arrival, patient was awake and alert while upright in bed. Patient was independently oriented to place, city and situation and utilized the calendar for orientation to time with supervision level verbal cues. Patient participated in reading comprehension tasks that focused on both functional reading and reading at the paragraph level. Patient with intermittent phonemic errors when reading aloud that patient required Min verbal cues to self-monitor and correct. Patient answered functional reading comprehension questions with 75% accuracy and reading comprehension tasks with 100% accuracy. Mod verbal cues were needed for redirection with sustained attention to tasks for ~30 minutes. Patient with improved social engagement today and overall word-finding during informal conversation with Min sentence completion cues needed for word-finding when answering structured questions. Patient left upright in bed with alarm on and family present. Continue with current plan of care.      Pain No/Denies Pain    Agitated Behavior Scale: TBI Observation Details Observation Environment: CIR Start of observation period - Date: 01/10/22 Start of observation period - Time: 0815 End of observation period - Date: 01/10/22 End of observation period - Time: 0900 Agitated Behavior Scale (DO NOT LEAVE BLANKS) Short attention span, easy distractibility, inability to concentrate: Present to a slight degree Impulsive, impatient, low tolerance for pain or frustration: Present to a slight degree Uncooperative, resistant to care, demanding: Absent Violent and/or threatening violence toward people or property: Absent Explosive and/or unpredictable anger: Absent Rocking, rubbing, moaning, or other self-stimulating behavior: Absent Pulling at tubes, restraints, etc.: Absent Wandering from treatment areas: Absent Restlessness, pacing, excessive movement: Absent Repetitive behaviors, motor, and/or verbal: Absent Rapid, loud, or excessive talking: Absent Sudden changes of mood: Absent Easily initiated or excessive crying and/or laughter: Absent Self-abusiveness, physical and/or verbal: Absent Agitated behavior scale total score: 16  Therapy/Group: Individual Therapy  Brandon [name] 01/10/2022, 9:44 AM

## 2022-01-10 NOTE — Progress Notes (Signed)
PROGRESS NOTE   Subjective/Complaints:  Patient seen and examined at bedside with wife, after finishing up a.m. SLP session.  SLP reports patient did excellent this morning, attention much improved and recall also better.  Discussed with husband and wife the initiation of Ritalin this a.m., discussed potential side effects to look out for, which patient currently denies.  Patient did have a suppository overnight, along with multiple large BMs.  Because of this, sleep was slightly interrupted, but overall feels it is improved.  When asked, patient endorses ongoing headache, 4 out of 10, intermittent, lasting for several hours at a time.  This has not changed since admission, and covers the entire head, not associated with photophobia or nausea.  Discussed potentially trying a new medication tomorrow, but holding off with initiation of Ritalin today, which family is agreeable to.  No other concerns or complaints.    ROS: Patient denies fever, rash, sore throat, blurred vision, dizziness, nausea, vomiting, constipation, diarrhea, cough, shortness of breath or chest pain   Objective:  No results for input(s): "WBC", "HGB", "HCT", "PLT" in the last 72 hours.  Recent Labs    01/08/22 0624  NA 139  K 3.7  CL 105  CO2 27  GLUCOSE 141*  BUN 20  CREATININE 1.00  CALCIUM 9.1      Intake/Output Summary (Last 24 hours) at 01/10/2022 0944 Last data filed at 01/10/2022 0745 Gross per 24 hour  Intake 540 ml  Output --  Net 540 ml        Physical Exam: Vital Signs Blood pressure 128/64, pulse 82, temperature 97.8 F (36.6 C), temperature source Oral, resp. rate 18, height 6' 6.5" (1.994 m), weight 107.8 kg, SpO2 95 %.  General: Alert and oriented to self, place, event, and time. HEENT: PERRLA, EOMI,oral mucosa pink and moist, + HOH, b/l hearing aides.  Neck: Supple without JVD or lymphadenopathy Heart: Reg rate and rhythm. No  murmurs rubs or gallops Chest: CTA bilaterally without wheezes, rales, or rhonchi; no distress, good air movement Abdomen: Soft, non-tender, non-distended, bowel sounds positive. Extremities: No clubbing, cyanosis, or edema. Psych: Pt's affect is appropriate. Pt is cooperative. Skin: crani incisions cdi, s/p staple removal.  Healing.  Neuro: CN 2-12 grossly intact. Follows all commands, answers questions appropriately >80%; much improved this AM Musculoskeletal: Moves all 4 extremities antigravity in bed and against resistance.    Assessment/Plan: 1. Functional deficits which require 3+ hours per day of interdisciplinary therapy in a comprehensive inpatient rehab setting. Physiatrist is providing close team supervision and 24 hour management of active medical problems listed below. Physiatrist and rehab team continue to assess barriers to discharge/monitor patient progress toward functional and medical goals  Care Tool:  Bathing    Body parts bathed by patient: Face, Front perineal area   Body parts bathed by helper: Right arm, Left arm, Chest, Abdomen, Buttocks, Right upper leg, Left upper leg, Right lower leg, Left lower leg     Bathing assist Assist Level: Maximal Assistance - Patient 24 - 49%     Upper Body Dressing/Undressing Upper body dressing   What is the patient wearing?: Pull over shirt    Upper body assist  Assist Level: Maximal Assistance - Patient 25 - 49%    Lower Body Dressing/Undressing Lower body dressing      What is the patient wearing?: Pants, Incontinence brief     Lower body assist Assist for lower body dressing: 2 Helpers     Toileting Toileting    Toileting assist Assist for toileting: Total Assistance - Patient < 25%     Transfers Chair/bed transfer  Transfers assist  Chair/bed transfer activity did not occur: Safety/medical concerns  Chair/bed transfer assist level: 2 Helpers     Locomotion Ambulation   Ambulation assist       Assist level: Moderate Assistance - Patient 50 - 74% Assistive device: Walker-rolling Max distance: 6   Walk 10 feet activity   Assist     Assist level: Moderate Assistance - Patient - 50 - 74%     Walk 50 feet activity   Assist Walk 50 feet with 2 turns activity did not occur: Safety/medical concerns         Walk 150 feet activity   Assist Walk 150 feet activity did not occur: Safety/medical concerns         Walk 10 feet on uneven surface  activity   Assist Walk 10 feet on uneven surfaces activity did not occur: Safety/medical concerns         Wheelchair     Assist Is the patient using a wheelchair?: Yes (per PT LTGs, pt to propel w/c at DC; note plan for w/c propulsion) Type of Wheelchair: Manual Wheelchair activity did not occur: Safety/medical concerns         Wheelchair 50 feet with 2 turns activity    Assist    Wheelchair 50 feet with 2 turns activity did not occur: Safety/medical concerns       Wheelchair 150 feet activity     Assist  Wheelchair 150 feet activity did not occur: Safety/medical concerns       Blood pressure 128/64, pulse 82, temperature 97.8 F (36.6 C), temperature source Oral, resp. rate 18, height 6' 6.5" (1.994 m), weight 107.8 kg, SpO2 95 %.  Medical Problem List and Plan: 1. Functional deficits secondary to bilateral SDH's after fall s/p bilateral burr hole evacuation 8/14             -patient may shower             -ELOS/Goals: 12-14 days, supervision to mod I goals with PT, OT, SLP  -CIR therapies including PT, OT, and SLP  2.  Antithrombotics: -DVT/anticoagulation:  Mechanical:  Antiembolism stockings, knee (TED hose) Bilateral lower extremities Sequential compression devices, below knee Bilateral lower extremities             - CT head 8/24 stable -> Lovenox 40 IM mg daily for DVT prophy 8/25             -antiplatelet therapy: n/a 3. Pain Management/headache: ongoing             -Tylenol 1000  mg TID scheduled and DC hydrocodone 8/22 -> Fioricet 1 tab TID + Tylenol 500 mg BID PRN for HA, pain 8/25 -> Fioricet/tylenol PRN 8/28; restart scheduled as not getting PRNs despite ongoing HA 8/29            - voltaren gel to b/l knees 4 g 8/25            - Gabapentin 100 mg BID for persistent HA started 8/28 -> DC 8/29 d/t worsening cognition             -  May try amitriptyline 8/31 if ongoing  4. Mood/Behavior/Sleep - improved:              -pt seems fairly up beat and wife is very supportive             -antipsychotic agents: n/a             - 8/26 Altered sleep wake cycle- trazodone increase trazodone to '75mg'$  for sleep-> 100 mg 8/29                   5. Neuropsych/cognition: This patient is not quite capable of making decisions on his own behalf.            - Given persistent slow processing and attention despite delirum workup, in setting of TBI, will trial Ritalin 5 mg BID 8/29 to improve arousal and participation - much improved attention  this AM  6. Skin/Wound Care: local care to scalp, removed staples 8/25    7. Fluids/Electrolytes/Nutrition: encourage appropriate po intake             -liberalized him to regular diet to help with po intake. Encouraged wife to bring food from home as well. Poor po intake so far however.              -check labs Monday - stable  8. Seizure prophylaxis: keppra '500mg'$  q 12 hours -> DC 8/23 9. Hx of Prostate Cancer, c/b urinary incontinence - ongoing             -flomax 0.'4mg'$  bid, proscar '5mg'$  daily              -emptying with intermittent incontinence thus far              - UA 8/23 without s/s infection             - Discussed incontinence of bowel and bladder with family in detail 8/25; they feel he is now close to his baseline, which is intermittently incontinent of both                  10. HTN: well controlled             -hctz/lisinopril daily per home regimen; monitor for orthostasis  11. Osteoarthritis bilateral knees, R>L              -voltaren gel to both knees - increased 8/25               - Discussing RLE bracing with PT for support and sensory feedback 8/25             -his knee pain certainly was contributing to his recent falls and balance deficits in the months prior to this admit    12. Delirium, likely multifactorial etiology (post-hospitalization, polypharmacy, TBI) - stable - Ensure wearing hearing aides; wife has microphone available to amplify  - UA 8/23 stable; CBC 8/21-22 stable, no leukocytosis - Delirium precautions added and discussed with family - Keppra may be contributing, DC 8/23  - repeat CT head  - ordered 8/24 - stable - initiate sleep log 8/24 - reinforce 8/29 - DC PRN compazine and zofran, not using and has cog s/e  13.  Constipation - PRN suppository 8/27, with results - Daily Miralax + 1x dose 8/29 - [place]-S 1 TAB QHS 8/28 -> BID 8/29 - PRN suppository 8/29 - +results  Hypokalemia: resolved -K+ 3.3 , KCL supplement -> 3.7 8/28  LOS: 11 days A  FACE TO FACE EVALUATION WAS PERFORMED  Est Dispo: Tuesday, Sept 12th to home with family assistance  Gertie Gowda 01/10/2022, 9:44 AM

## 2022-01-10 NOTE — Progress Notes (Signed)
Physical Therapy TBI Note  Patient Details  Name: Brandon [name] MRN: [id number] Date of Birth: November 17, 1945  Today's Date: 01/10/2022 PT Individual Time: 0905-1000 PT Individual Time Calculation (min): 55 min   Short Term Goals: Week 1:  PT Short Term Goal 1 (Week 1): Pt will transfer sup to sit w/ min A. PT Short Term Goal 1 - Progress (Week 1): Progressing toward goal PT Short Term Goal 2 (Week 1): Pt will transfer sit to stand w/ min A PT Short Term Goal 2 - Progress (Week 1): Progressing toward goal PT Short Term Goal 3 (Week 1): Pt will amb x 50' w/ RW and min A PT Short Term Goal 3 - Progress (Week 1): Progressing toward goal PT Short Term Goal 4 (Week 1): Pt will assess stairs. PT Short Term Goal 4 - Progress (Week 1): Progressing toward goal Week 2:  PT Short Term Goal 1 (Week 2): Pt will complete bed mobility consistently with minA. PT Short Term Goal 2 (Week 2): Pt will complete sit to stand transfer consistently with minA. PT Short Term Goal 3 (Week 2): Pt will perform bed to chair transfer consistently with minA. PT Short Term Goal 4 (Week 2): Pt will ambulate x50' with modA +2 and LRAD. Week 3:     Skilled Therapeutic Interventions/Progress Updates:      Therapy Documentation Precautions:  Precautions Precautions: Fall Restrictions Weight Bearing Restrictions: No Pain: Pain Assessment Pain Scale: 0-10 Pain Score: 0-No pain Agitated Behavior Scale: TBI Observation Details Observation Environment: cir Start of observation period - Date: 01/10/22 Start of observation period - Time: 0905 End of observation period - Date: 01/10/22 End of observation period - Time: 1000 Agitated Behavior Scale (DO NOT LEAVE BLANKS) Short attention span, easy distractibility, inability to concentrate: Present to a slight degree Impulsive, impatient, low tolerance for pain or frustration: Present to a slight degree Uncooperative, resistant to care, demanding: Absent Violent and/or  threatening violence toward people or property: Absent Explosive and/or unpredictable anger: Absent Rocking, rubbing, moaning, or other self-stimulating behavior: Absent Pulling at tubes, restraints, etc.: Absent Wandering from treatment areas: Absent Restlessness, pacing, excessive movement: Absent Repetitive behaviors, motor, and/or verbal: Absent Rapid, loud, or excessive talking: Absent Sudden changes of mood: Absent Easily initiated or excessive crying and/or laughter: Absent Self-abusiveness, physical and/or verbal: Absent Agitated behavior scale total score: 16  Pt initially supine.  Dons pants supine w/mod assist. Supine to sit w/min assist but then leans post w/intermittent mod assist.  In sitting pt worked on forward leaning toward feet/shoes but returns to post lean tendency w/return upright.   Sit to stand mod +2, post lean, narrow base, flexed knees w/IR of hips.  Gait 96f to wc w/mod assist of 2 progressing to max assist of 2 as pt proceeded to sit when visualizing wc yet not reaching.    Transported to hallway.   Gait trials as follows: 40-465fx 2 using grocery cart to promote anterior wt shift/forward progression of gait/therapist controls progression of cart.  Pt w/downward gaze, narrow base, poor clearance, flexion of hips and knees, Ir at hips, inability to fully extend at knees throughout gait cycle/progressively declines w/fatigue.  In standing while awaiting wc, pt worked on upright posture w/heavy cueing to extend knees, overhead reaching toward ceiling to promote upright.  WC retropulsion w/bilat Les followed by propulsion w biDillard's/difficulty sequencing and decreased RLE activation/motor control vs L w/task.  Stairs: Ascended step over step/descended via reversing 4 stairs w/2 rails  w/mod assist of 2, heavy cueing for foot placement firmly on steps, flexed posture, post lean, decreased safety awareness, impaired motor planning and delayed processing  w/task,  To promote ant wt shift in standing pt sottd facing wall w/hands flat on wall ahead requiring forward lean w/BOS anterior to feet- procceded w/walking hands up wall vs wall pushups.  Pt left oob in wc w/alarm belt set and needs in reach   Therapy/Group: Individual Therapy  Jerrilyn Cairo 01/10/2022, 11:06 AM

## 2022-01-10 NOTE — Progress Notes (Signed)
Occupational Therapy Session Note  Patient Details  Name: NIGEL ERICSSON MRN: [id number] Date of Birth: 1945/09/11  Today's Date: 01/10/2022 OT Individual Time: [phone] for billed minutes (Total time in care = 1447- 1540) OT Individual Time Calculation (min): 28 min (total time in care = 53 minutes)   Short Term Goals: Week 2:  OT Short Term Goal 1 (Week 2): Pt will increase toilet transfer to Mod A x1 using RW OT Short Term Goal 2 (Week 2): Pt will complete sit to stand with RW with Mod Assist x1. OT Short Term Goal 3 (Week 2): Marland KitchenPt will demonstrate improved sitting balance and activity tolerance while completing self care tasks seated on EOB with Min A to maintain balance  Skilled Therapeutic Interventions/Progress Updates:  Pt awake in bed with spouse present in room upon OT arrival to the room. Pt reports, "I'm just so tired today" Pt in agreement for OT session. Session involved 2 Ot's for skilled +2 assistance. Session separated into 2 treatment notes from skilled OT.  Therapy Documentation Precautions:  Precautions Precautions: Fall Restrictions Weight Bearing Restrictions: No Vital Signs: Please see "Flowsheet" for most recent vitals charted by nursing staff.  Pain: Pain Assessment Pain Scale: 0-10 Pain Score: 0-No pain  ADL: Please see other OT note Mickel Baas) for information regarding ADLs while seated at EOB.   Therapeutic Activity: After completing ADL's, pt in agreement to transfer to w/c. Pt requires 2 attempts to successfully completing a squat-pivot transfer from EOB > w/c. On first attempt, pt noted to place RLE under w/c when initiating standing and LLE too far under bed making it difficult for pt to advance BLE during pivot. Pt then requires being returned to EOB to re-attempt. Pt then able to complete a squat-pivot transfer to the R from EOB > w/c with maximal assistance x 1 & CGA/minimal assist of other for safety. Pt requires increased assistance and cueing for  proper positioning of BLE to safely initiate transfer and during transfer due to pt putting B feet too far back under w/c. Pt requires maximal assistance to fully scoot back in chair for optimal posture & positioning. Pt demo's poor posture and positioning in w/c. OT made adjustments to w/c to remove headrest to decrease cervical flexion positioning while seated in the w/c and able to move back rest on w/c back to increase surface area on w/c seat. OT made these adjustments for improves position and safety while seated in the w/c. Increased time needed to make w/c adjustments.   Pt requested to stay in the w/c at end of session. Pt left sitting comfortably in the w/c with PT present to initiate PT session.  Therapy/Group: Individual Therapy  Barbee Shropshire 01/10/2022, 4:51 PM

## 2022-01-10 NOTE — Progress Notes (Signed)
Occupational Therapy TBI Note  Patient Details  Name: Brandon [name] MRN: [id number] Date of Birth: August 18, 1945  Today's Date: 01/10/2022 OT Co-Treatment Time: 1445-1540 OT Co-Treatment Time Calculation (min): 55 min   Short Term Goals: Week 2:  OT Short Term Goal 1 (Week 2): Pt will increase toilet transfer to Mod A x1 using RW OT Short Term Goal 2 (Week 2): Pt will complete sit to stand with RW with Mod Assist x1. OT Short Term Goal 3 (Week 2): Marland KitchenPt will demonstrate improved sitting balance and activity tolerance while completing self care tasks seated on EOB with Min A to maintain balance  Skilled Therapeutic Interventions/Progress Updates:    Patient agreeable to participate in OT session. Reports 0/10 pain level.  Co-treatment with OT Hatti. Performed d/t high complexity skilled intervention needed 2/2 TBI. Patient participated in skilled OT session focusing on ADL re-training, proprioception, sitting balance, bed mobility, and coordination while seated on EOB. Therapist integrated functional self care tasks and reaching away from right lateral and posterior lean  in order to improve, pt's body awareness and midline orientation in order to progress participation in self care tasks. With hospital bed level, pt required mod/max assist posteriorly to correct right lateral/posterior lean. Pt demonstrates continued urge to flex bilateral knees under sitting surface further impairing ability to maintain balance. OT provided manual assist to correct LE seated posture placing bilateral knees in 90 degrees flexion. Manual assist provided to stability right foot to maintain proper placement. Without OT providing stability to BLE, pt reverts back to increased knee flexion and lateral/posterior lean. Utilized mechanical bed feature to provide assistance to pt's right hip by elevating slightly. Positioning change did help when demonstrating static sitting and no challenges to focus. Pt was further challenged  by engaging in extended functional reaching with either right or left arm to the left. Pt completed teeth brushing while seated on EOB with max difficulty with coordination while using electric toothbrush. When challenged with an additional task during grooming, pt's sitting balance and seated posture worsened. Pt relied on posterior support from OT when it was provided although demonstrates the ability to self correct when support is removed.    Therapy Documentation Precautions:  Precautions Precautions: Fall Restrictions Weight Bearing Restrictions: No  Agitated Behavior Scale: TBI    01/10/22 0245  Observation Details  Observation Environment pt's room, main rehab gym  Start of observation period - Date 01/10/22  Start of observation period - Time 1445  End of observation period - Date 01/10/22  End of observation period - Time 1540  Agitated Behavior Scale (DO NOT LEAVE BLANKS)  Short attention span, easy distractibility, inability to concentrate 2  Impulsive, impatient, low tolerance for pain or frustration 2  Uncooperative, resistant to care, demanding 1  Violent and/or threatening violence toward people or property 1  Explosive and/or unpredictable anger 1  Rocking, rubbing, moaning, or other self-stimulating behavior 1  Pulling at tubes, restraints, etc. 1  Wandering from treatment areas 1  Restlessness, pacing, excessive movement 1  Repetitive behaviors, motor, and/or verbal 1  Rapid, loud, or excessive talking 1  Sudden changes of mood 1  Easily initiated or excessive crying and/or laughter 1  Self-abusiveness, physical and/or verbal 1  Agitated behavior scale total score 16        Therapy/Group: Co-Treatment  Ailene Ravel, OTR/L,CBIS  Supplemental OT - [facility]  01/10/2022, 5:50 PM

## 2022-01-11 ENCOUNTER — Inpatient Hospital Stay (HOSPITAL_COMMUNITY): Payer: Medicare HMO

## 2022-01-11 MED ORDER — METHYLPHENIDATE HCL 5 MG PO TABS
5.0000 mg | ORAL_TABLET | Freq: Every day | ORAL | Status: DC
  Administered 2022-01-12 – 2022-01-23 (×12): 5 mg via ORAL
  Filled 2022-01-11 (×13): qty 1

## 2022-01-11 NOTE — Progress Notes (Signed)
Occupational Therapy Session Note  Patient Details  Name: Brandon [name] MRN: [id number] Date of Birth: 05/21/1945  Today's Date: 01/11/2022 OT Individual Time: 6761-9509 OT Individual Time Calculation (min): 64 min    Short Term Goals: Week 2:  OT Short Term Goal 1 (Week 2): Pt will increase toilet transfer to Mod A x1 using RW OT Short Term Goal 2 (Week 2): Pt will complete sit to stand with RW with Mod Assist x1. OT Short Term Goal 3 (Week 2): Marland KitchenPt will demonstrate improved sitting balance and activity tolerance while completing self care tasks seated on EOB with Min A to maintain balance  Skilled Therapeutic Interventions/Progress Updates:  Pt awake in bed upon OT arrival to the room. Pt reports, "Okay," referring to a shower. Pt in agreement for OT session.  Therapy Documentation Precautions:  Precautions Precautions: Fall Restrictions Weight Bearing Restrictions: No Vital Signs: Please see "Flowsheet" for most recent vitals charted by nursing staff.  Pain: Pain Assessment Pain Scale: 0-10 Pain Score: 4  Pain Type: Acute pain Pain Location: Neck Pain Orientation: Mid Pain Descriptors / Indicators: Aching Pain Onset: On-going Pain Intervention(s): Distraction;Emotional support Multiple Pain Sites: No  ADL: Upper Body Bathing: Moderate assistance (Pt requires moderate assistance to bathe UB parts. Pt requires increased assistance to bathe BUE and moderate - maximal verbal and tactile prompts for sequencing, thoroughness, and safety.) Where Assessed-Upper Body Bathing: Shower Lower Body Bathing: Maximal assistance (Pt able to participate in bathing 3/5 LB parts. Pt able to participate in bathing 3/5 body parts while seated on tub-bench in the shower. Pt requires assistance to bathe B feet and buttocks.) Where Assessed-Lower Body Bathing: Shower Upper Body Dressing: Maximal assistance (Pt able to doff and don a pull-over style shirt while seated at EOB with moderate -  maximal assistance to ensure sitting balance. Pt able to complete 6/6 portions of task with clothing management.) Where Assessed-Upper Body Dressing: Edge of bed Lower Body Dressing: Maximal assistance (Pt requires maximal assistance to doff pants with partial stand on tub-bench and to don pants at bed level wiht pt using bridge technique to pull up over buttocks.) Where Assessed-Lower Body Dressing: Bed level Walk-In Shower Transfer: Dependent (Pt able to perform squat-pivot transfer to the L with maximal assistance x 2 and then able to perform sit <> stand in [place] with minimal assistance to then transport back from tub-bench > EOB.) Social research officer, government Method: Education officer, environmental: Transfer tub bench ADL Comments: Pt requires maximal verbal and tactile prompts to complete ADLs. Pt demo's increased difficulty with following instructions due to hearing deficits and impaired processing. Pt requires maximal assistance x 2 for sit <> supine transfers. Pt demo's after after ADL routine and requesting to remain lying in bed. Pt able to roll side to side in bed with moderate assistance due to pt pushing on bed rails for assistance with LB dressing.   Pt returned to bed at end of session. Pt left resting comfortably in bed with personal belongings and call light within reach, bed alarm on and activated, bed in low position, 3 bed rails up, spouse present in room, and comfort needs attended to.   Therapy/Group: Individual Therapy  Barbee Shropshire 01/11/2022, 4:39 PM

## 2022-01-11 NOTE — Progress Notes (Signed)
Speech Language Pathology TBI Note  Patient Details  Name: Brandon [name] MRN: [id number] Date of Birth: [birth year]  Today's Date: 01/11/2022 SLP Individual Time: 0915-1000 SLP Individual Time Calculation (min): 45 min  Short Term Goals: Week 2: SLP Short Term Goal 1 (Week 2): Pt will sustain his attention to basic, familiar tasks for 3-5 minute intervals wtih mod cues for redirection. SLP Short Term Goal 2 (Week 2): Pt will utilize external aids to recall orientation information with mod verbal and visual cues. SLP Short Term Goal 3 (Week 2): Patient will self-monitor and correct errors at the word level during a structured language task with Mod A multimodal cues. SLP Short Term Goal 4 (Week 2): Patient will verbally express his basic wants/needs at the phrase level with Mod A multimodal cues.  Skilled Therapeutic Interventions: Skilled treatment session focused on cognitive goals. Upon arrival, patient was awake in bed. Patient initiated a conversation with SLP regarding an event from yesterday afternoon during non therapeutic time which is indicative on improved recall of daily information. Patient expressed mildly abstract thoughts and ideas at the sentence level with extra time and supervision level verbal cues. Patient participated in a basic money management task with overall Mod A verbal cues for sustained attention and Min verbal and visual cues for functional problem solving and error awareness. Patient left upright in bed with alarm on and all needs within reach. Continue with current plan of care.      Pain No/Denies Pain   Agitated Behavior Scale: TBI Observation Details Observation Environment: CIR Start of observation period - Date: 01/11/22 Start of observation period - Time: 0915 End of observation period - Date: 01/11/22 End of observation period - Time: 1000 Agitated Behavior Scale (DO NOT LEAVE BLANKS) Short attention span, easy distractibility, inability to  concentrate: Present to a slight degree Impulsive, impatient, low tolerance for pain or frustration: Present to a slight degree Uncooperative, resistant to care, demanding: Absent Violent and/or threatening violence toward people or property: Absent Explosive and/or unpredictable anger: Absent Rocking, rubbing, moaning, or other self-stimulating behavior: Absent Pulling at tubes, restraints, etc.: Absent Wandering from treatment areas: Absent Restlessness, pacing, excessive movement: Absent Repetitive behaviors, motor, and/or verbal: Absent Rapid, loud, or excessive talking: Absent Sudden changes of mood: Absent Easily initiated or excessive crying and/or laughter: Absent Self-abusiveness, physical and/or verbal: Absent Agitated behavior scale total score: 16  Therapy/Group: Individual Therapy  Tenlee Wollin 01/11/2022, 2:55 PM

## 2022-01-11 NOTE — Progress Notes (Signed)
PROGRESS NOTE   Subjective/Complaints:  Patient seen and examined at bedside with wife.  Since starting Ritalin, noted much improved attention and comprehension of tasks.  However, therapy notes indicate that patient was frustrated and uncooperative in the room, and wife states that he was unable to sleep last night due to "thrashing in bed", and ongoing delusional thoughts about the death of his children.  Patient notes ongoing issues with pain in his bilateral knees, right greater than left.  This is chronic, mildly improved with Voltaren gel but overall remains a hindrance to his progress in standing and ambulation.  He notes he has never had imaging of his knees, and never had injections for pain in the past.  Discussed risks and benefits of potential steroid injection to right knee today, patient and wife consent.   ROS: Patient denies fever, rash, sore throat, blurred vision, dizziness, nausea, vomiting, constipation, diarrhea, cough, shortness of breath or chest pain   Objective:  No results for input(s): "WBC", "HGB", "HCT", "PLT" in the last 72 hours.  No results for input(s): "NA", "K", "CL", "CO2", "GLUCOSE", "BUN", "CREATININE", "CALCIUM" in the last 72 hours.    Intake/Output Summary (Last 24 hours) at 01/11/2022 0919 Last data filed at 01/11/2022 0700 Gross per 24 hour  Intake 480 ml  Output 300 ml  Net 180 ml        Physical Exam: Vital Signs Blood pressure 126/72, pulse 87, temperature 98.6 F (37 C), temperature source Oral, resp. rate 16, height 6' 6.5" (1.994 m), weight 107.8 kg, SpO2 97 %.  General: Alert and oriented to self, place, event, and time. HEENT: PERRLA, EOMI,oral mucosa pink and moist, + HOH, b/l hearing aides.  Neck: Supple without JVD or lymphadenopathy Heart: Reg rate and rhythm. No murmurs rubs or gallops Chest: CTA bilaterally without wheezes, rales, or rhonchi; no distress, good air  movement Abdomen: Soft, non-tender, non-distended, bowel sounds positive. Extremities: No clubbing, cyanosis, or edema. Nontender to palpation along R knee, without fluctuance, edema, crepitus or deformity. ROM limited to -5 degrees flexion d/t pain.  Psych: Pt's affect is appropriate. Pt is cooperative. Skin: crani incisions cdi, s/p staple removal.  Healing.  Neuro: CN 2-12 grossly intact. Follows all commands, answers questions appropriately 50-75%; mild intermittent confusion.  Musculoskeletal: Moves all 4 extremities antigravity in bed and against resistance.    Assessment/Plan: 1. Functional deficits which require 3+ hours per day of interdisciplinary therapy in a comprehensive inpatient rehab setting. Physiatrist is providing close team supervision and 24 hour management of active medical problems listed below. Physiatrist and rehab team continue to assess barriers to discharge/monitor patient progress toward functional and medical goals  Care Tool:  Bathing    Body parts bathed by patient: Face, Front perineal area   Body parts bathed by helper: Right arm, Left arm, Chest, Abdomen, Buttocks, Right upper leg, Left upper leg, Right lower leg, Left lower leg     Bathing assist Assist Level: Maximal Assistance - Patient 24 - 49%     Upper Body Dressing/Undressing Upper body dressing   What is the patient wearing?: Pull over shirt    Upper body assist Assist Level: Maximal Assistance -  Patient 25 - 49%    Lower Body Dressing/Undressing Lower body dressing      What is the patient wearing?: Pants, Incontinence brief     Lower body assist Assist for lower body dressing: 2 Helpers     Toileting Toileting    Toileting assist Assist for toileting: Total Assistance - Patient < 25%     Transfers Chair/bed transfer  Transfers assist  Chair/bed transfer activity did not occur: Safety/medical concerns  Chair/bed transfer assist level: 2 Helpers      Locomotion Ambulation   Ambulation assist      Assist level: Moderate Assistance - Patient 50 - 74% Assistive device: Walker-rolling Max distance: 6   Walk 10 feet activity   Assist     Assist level: Moderate Assistance - Patient - 50 - 74%     Walk 50 feet activity   Assist Walk 50 feet with 2 turns activity did not occur: Safety/medical concerns         Walk 150 feet activity   Assist Walk 150 feet activity did not occur: Safety/medical concerns         Walk 10 feet on uneven surface  activity   Assist Walk 10 feet on uneven surfaces activity did not occur: Safety/medical concerns         Wheelchair     Assist Is the patient using a wheelchair?: Yes (per PT LTGs, pt to propel w/c at DC; note plan for w/c propulsion) Type of Wheelchair: Manual Wheelchair activity did not occur: Safety/medical concerns         Wheelchair 50 feet with 2 turns activity    Assist    Wheelchair 50 feet with 2 turns activity did not occur: Safety/medical concerns       Wheelchair 150 feet activity     Assist  Wheelchair 150 feet activity did not occur: Safety/medical concerns       Blood pressure 126/72, pulse 87, temperature 98.6 F (37 C), temperature source Oral, resp. rate 16, height 6' 6.5" (1.994 m), weight 107.8 kg, SpO2 97 %.  Medical Problem List and Plan: 1. Functional deficits secondary to bilateral SDH's after fall s/p bilateral burr hole evacuation 8/14             -patient may shower             -ELOS/Goals: 12-14 days, supervision to mod I goals with PT, OT, SLP  -CIR therapies including PT, OT, and SLP  2.  Antithrombotics: -DVT/anticoagulation:  Mechanical:  Antiembolism stockings, knee (TED hose) Bilateral lower extremities Sequential compression devices, below knee Bilateral lower extremities             - CT head 8/24 stable -> Lovenox 40 IM mg daily for DVT prophy 8/25             -antiplatelet therapy: n/a  3. Pain  Management/headache: ongoing             -Tylenol 1000 mg TID scheduled and DC hydrocodone 8/22 -> Fioricet 1 tab TID + Tylenol 500 mg BID PRN for HA, pain 8/25 -> Fioricet/tylenol PRN 8/28; restart scheduled as not getting PRNs despite ongoing HA 8/29            - Gabapentin 100 mg BID for persistent HA started 8/28 -> DC 8/29 d/t worsening cognition             - May try amitriptyline if ongoing - no complaints 8/31   4. Mood/Behavior/Sleep -  improved:              -pt seems fairly up beat and wife is very supportive             -antipsychotic agents: n/a             - 8/26 Altered sleep wake cycle- trazodone increase trazodone to '75mg'$  for sleep-> 100 mg 8/29                   5. Neuropsych/cognition: This patient is not quite capable of making decisions on his own behalf.            - Given persistent slow processing and attention despite delirum workup, in setting of TBI, will trial Ritalin 5 mg BID 8/29 to improve arousal and participation -> reduce to 5 mg daily d/t nighttime hyperarrousal 8/31  6. Skin/Wound Care: local care to scalp, removed staples 8/25    7. Fluids/Electrolytes/Nutrition: encourage appropriate po intake             -liberalized him to regular diet to help with po intake. Encouraged wife to bring food from home as well. Poor po intake so far however.              -check labs Monday - stable  8. Seizure prophylaxis: keppra '500mg'$  q 12 hours -> DC 8/23 9. Hx of Prostate Cancer, c/b urinary incontinence - ongoing             -flomax 0.'4mg'$  bid, proscar '5mg'$  daily              -emptying with intermittent incontinence thus far              - UA 8/23 without s/s infection             - Discussed incontinence of bowel and bladder with family in detail 8/25; they feel he is now close to his baseline, which is intermittently incontinent of both                  10. HTN: well controlled             -hctz/lisinopril daily per home regimen; monitor for orthostasis  11.  Osteoarthritis bilateral knees, R>L             -voltaren gel to both knees - increased 8/25               - Discussing RLE bracing with PT for support and sensory feedback 8/25             -his knee pain certainly was contributing to his recent falls and balance deficits in the months prior to this admit             - voltaren gel to b/l knees 4 g 8/25            - Will get xray R knee 8/31, plan for steroid injection 8/31  12. Delirium, likely multifactorial etiology (post-hospitalization, polypharmacy, TBI) - stable - Ensure wearing hearing aides; wife has microphone available to amplify  - UA 8/23 stable; CBC 8/21-22 stable, no leukocytosis - Delirium precautions added and discussed with family - Keppra may be contributing, DC 8/23  - repeat CT head  - ordered 8/24 - stable - initiate sleep log 8/24 - reinforce 8/29 - DC PRN compazine and zofran, not using and has cog s/e  13.  Constipation - PRN suppository 8/27, with results - Daily Miralax +  1x dose 8/29 - [place]-S 1 TAB QHS 8/28 -> BID 8/29 - PRN suppository 8/29 - +results  Hypokalemia: resolved -K+ 3.3 , KCL supplement -> 3.7 8/28  LOS: 12 days A FACE TO FACE EVALUATION WAS PERFORMED  Est Dispo: Tuesday, Sept 12th to home with family assistance  Gertie Gowda 01/11/2022, 9:19 AM

## 2022-01-11 NOTE — Progress Notes (Signed)
Physical Therapy Session Note  Patient Details  Name: Brandon [name] MRN: [id number] Date of Birth: 1945/07/01  Today's Date: 01/11/2022 PT Individual Time: 1100-1129 and [id number] PT Individual Time Calculation (min): 29 min and 60 min  Short Term Goals: Week 2:  PT Short Term Goal 1 (Week 2): Pt will complete bed mobility consistently with minA. PT Short Term Goal 2 (Week 2): Pt will complete sit to stand transfer consistently with minA. PT Short Term Goal 3 (Week 2): Pt will perform bed to chair transfer consistently with minA. PT Short Term Goal 4 (Week 2): Pt will ambulate x50' with modA +2 and LRAD.  Skilled Therapeutic Interventions/Progress Updates:     Pt received supine in bed and agrees to therapy. No complaint of pain. Supine to sit with verbal cues for sequencing and modA initially progressing to maxA upon sitting up due to heavy posterior lean. PT assists to don shoes in sitting while providing minA/modA for stability due to posterior bias. Pt performs scoot transfer to [facility] with modA and cues for hand placement and sequencing, requiring 4-5 small scoots to complete. WC transport outside for time management. PT has discussion with pt regarding pt's feelings that this PT did not treat him well in previous sessions. PT provides active listening and relates to pt that it is a difficult balance to motivate pt to participate while not coming across as overly firm, with pt perceiving therapist as aggressive. Pt verbalizes understanding. WC transport back inside. Squat pivot back to bed with modA and modA to prevent fall from posterior lean. Sit to supine with modA and cues for body mechanics and sequencing. Left seated with alarm intact and all needs within reach.  2nd Session: Pt asleep upon PT arrival. Easily roused with verbal and tactile stimuli. Reports some discomfort in R knee. PT provides rest breaks and mobility to manage pain. Pt performs supine to sit with modA and cues for  logrolling, hand placement, and sequencing. Pt requires modA/maxA due to posterior lean at EOB. ModA provided for scoot transfer to [facility] with cues for hand placement and sequencing. Pt verbalizes need to urinate. Sit to stand in [place] with minA and stedy transfer to toilet. Once seated on toilet, pt is cued to scoot backward in order to position penis within bowl, but pt has difficulty with anterior lean to perform adequate weight shift and scoot. Pt becomes frustrated and stands back up to stedy, but has significant R sided lean, propping himself against wall/. PT provides max cues to bring pt back to midline but pt has difficulty following instructions. ModA/maxA required for safety to transfer pt back to [facility]. Pt performs additional sit to stand in stedy to pull up brief and pants.  In gym, pt performs sit to stand in front of mirror for visual feedback, with both hands supported on parallel bar. Pt able to stand to parallel bar with minA and cues for initiation, sequencing, and power-up. Pt cued to extend knees and hips in standing and to shift weight forward due to posterior lean. Pt remains standing several seconds and then sits himself back down, despite PT cues to try to extend time on feet for endurance and strengthening. Pt performs x4 additional reps of sit ot stand with similar presentation, each time sitting back down after several seconds. Pt requests to return to room due to fatigue.   Squat pivot back to bed with modA. Pt left supine with alarm intact and all needs within reach. Pt misses  15 minutes of skilled PT due to refusal to participate.  Therapy Documentation Precautions:  Precautions Precautions: Fall Restrictions Weight Bearing Restrictions: No    Therapy/Group: Individual Therapy  Breck Coons, PT, DPT 01/11/2022, 4:31 PM

## 2022-01-12 LAB — BASIC METABOLIC PANEL
Anion gap: 7 (ref 5–15)
BUN: 13 mg/dL (ref 8–23)
CO2: 27 mmol/L (ref 22–32)
Calcium: 8.9 mg/dL (ref 8.9–10.3)
Chloride: 104 mmol/L (ref 98–111)
Creatinine, Ser: 0.98 mg/dL (ref 0.61–1.24)
GFR, Estimated: >60 mL/min (ref >60)
Glucose, Bld: 114 mg/dL — ABNORMAL HIGH (ref 70–99)
Potassium: 3.4 mmol/L — ABNORMAL LOW (ref 3.5–5.1)
Sodium: 138 mmol/L (ref 135–145)

## 2022-01-12 LAB — CBC
HCT: 34.4 % — ABNORMAL LOW (ref 39.0–52.0)
Hemoglobin: 12.1 g/dL — ABNORMAL LOW (ref 13.0–17.0)
MCH: 30.8 pg (ref 26.0–34.0)
MCHC: 35.2 g/dL (ref 30.0–36.0)
MCV: 87.5 fL (ref 80.0–100.0)
Platelets: 223 10*3/uL (ref 150–400)
RBC: 3.93 MIL/uL — ABNORMAL LOW (ref 4.22–5.81)
RDW: 12.4 % (ref 11.5–15.5)
WBC: 5.3 10*3/uL (ref 4.0–10.5)
nRBC: 0 % (ref 0.0–0.2)

## 2022-01-12 MED ORDER — LIDOCAINE HCL 1 % IJ SOLN
5.0000 mL | Freq: Once | INTRAMUSCULAR | Status: DC
  Filled 2022-01-12: qty 5

## 2022-01-12 MED ORDER — POTASSIUM CHLORIDE CRYS ER 20 MEQ PO TBCR
20.0000 meq | EXTENDED_RELEASE_TABLET | Freq: Two times a day (BID) | ORAL | Status: DC
  Administered 2022-01-12 – 2022-01-16 (×9): 20 meq via ORAL
  Filled 2022-01-12 (×9): qty 1

## 2022-01-12 MED ORDER — TRAZODONE HCL 50 MG PO TABS
100.0000 mg | ORAL_TABLET | Freq: Every day | ORAL | Status: DC
  Administered 2022-01-12 – 2022-01-22 (×11): 100 mg via ORAL
  Filled 2022-01-12 (×11): qty 2

## 2022-01-12 MED ORDER — TRIAMCINOLONE ACETONIDE 40 MG/ML IJ SUSP
40.0000 mg | Freq: Once | INTRAMUSCULAR | Status: AC
  Administered 2022-01-12: 40 mg via INTRA_ARTICULAR
  Filled 2022-01-12: qty 1

## 2022-01-12 MED ORDER — LIDOCAINE HCL (PF) 1 % IJ SOLN
5.0000 mL | Freq: Once | INTRAMUSCULAR | Status: AC
  Administered 2022-01-12: 5 mL
  Filled 2022-01-12: qty 5

## 2022-01-12 NOTE — Progress Notes (Addendum)
PROGRESS NOTE   Subjective/Complaints:  Patient seen and examined at bedside with wife.  Ongoing issues with confusion and agitation just before bedtime. Wife states he sleeps well once given trazodone.   Otherwise doing well. Ongoing R knee pain.   ROS: Patient denies fever, rash, sore throat, blurred vision, dizziness, nausea, vomiting, constipation, diarrhea, cough, shortness of breath or chest pain   Objective:  Recent Labs    01/12/22 0751  WBC 5.3  HGB 12.1*  HCT 34.4*  PLT 223    Recent Labs    01/12/22 0751  NA 138  K 3.4*  CL 104  CO2 27  GLUCOSE 114*  BUN 13  CREATININE 0.98  CALCIUM 8.9      Intake/Output Summary (Last 24 hours) at 01/12/2022 1130 Last data filed at 01/12/2022 0900 Gross per 24 hour  Intake 960 ml  Output --  Net 960 ml        Physical Exam: Vital Signs Blood pressure 122/79, pulse 85, temperature 97.7 F (36.5 C), temperature source Oral, resp. rate 16, height 6' 6.5" (1.994 m), weight 107.8 kg, SpO2 97 %.  General: Alert and oriented to self, place, event, and time. HEENT: PERRLA, EOMI,oral mucosa pink and moist, + HOH, b/l hearing aides.  Neck: Supple without JVD or lymphadenopathy Heart: Reg rate and rhythm. No murmurs rubs or gallops Chest: CTA bilaterally without wheezes, rales, or rhonchi; no distress, good air movement Abdomen: Soft, non-tender, non-distended, bowel sounds positive. Extremities: No clubbing, cyanosis, or edema.   Psych: Pt's affect is appropriate. Pt is cooperative. Skin: crani incisions cdi, s/p staple removal.  Healing.  Neuro: CN 2-12 grossly intact. Follows all commands,  Musculoskeletal: Moves all 4 extremities antigravity in bed and against resistance.    Assessment/Plan: 1. Functional deficits which require 3+ hours per day of interdisciplinary therapy in a comprehensive inpatient rehab setting. Physiatrist is providing close team  supervision and 24 hour management of active medical problems listed below. Physiatrist and rehab team continue to assess barriers to discharge/monitor patient progress toward functional and medical goals  Care Tool:  Bathing    Body parts bathed by patient: Face, Front perineal area   Body parts bathed by helper: Right arm, Left arm, Chest, Abdomen, Buttocks, Right upper leg, Left upper leg, Right lower leg, Left lower leg     Bathing assist Assist Level: Maximal Assistance - Patient 24 - 49%     Upper Body Dressing/Undressing Upper body dressing   What is the patient wearing?: Pull over shirt    Upper body assist Assist Level: Maximal Assistance - Patient 25 - 49%    Lower Body Dressing/Undressing Lower body dressing      What is the patient wearing?: Pants, Incontinence brief     Lower body assist Assist for lower body dressing: 2 Helpers     Toileting Toileting    Toileting assist Assist for toileting: Total Assistance - Patient < 25%     Transfers Chair/bed transfer  Transfers assist  Chair/bed transfer activity did not occur: Safety/medical concerns  Chair/bed transfer assist level: 2 Helpers     Locomotion Ambulation   Ambulation assist  Assist level: Moderate Assistance - Patient 50 - 74% Assistive device: Walker-rolling Max distance: 6   Walk 10 feet activity   Assist     Assist level: Moderate Assistance - Patient - 50 - 74%     Walk 50 feet activity   Assist Walk 50 feet with 2 turns activity did not occur: Safety/medical concerns         Walk 150 feet activity   Assist Walk 150 feet activity did not occur: Safety/medical concerns         Walk 10 feet on uneven surface  activity   Assist Walk 10 feet on uneven surfaces activity did not occur: Safety/medical concerns         Wheelchair     Assist Is the patient using a wheelchair?: Yes (per PT LTGs, pt to propel w/c at DC; note plan for w/c  propulsion) Type of Wheelchair: Manual Wheelchair activity did not occur: Safety/medical concerns         Wheelchair 50 feet with 2 turns activity    Assist    Wheelchair 50 feet with 2 turns activity did not occur: Safety/medical concerns       Wheelchair 150 feet activity     Assist  Wheelchair 150 feet activity did not occur: Safety/medical concerns       Blood pressure 122/79, pulse 85, temperature 97.7 F (36.5 C), temperature source Oral, resp. rate 16, height 6' 6.5" (1.994 m), weight 107.8 kg, SpO2 97 %.  Medical Problem List and Plan: 1. Functional deficits secondary to bilateral SDH's after fall s/p bilateral burr hole evacuation 8/14             -patient may shower             -ELOS/Goals: 12-14 days, supervision to mod I goals with PT, OT, SLP  -CIR therapies including PT, OT, and SLP  2.  Antithrombotics: -DVT/anticoagulation:  Mechanical:  Antiembolism stockings, knee (TED hose) Bilateral lower extremities Sequential compression devices, below knee Bilateral lower extremities             - CT head 8/24 stable -> Lovenox 40 IM mg daily for DVT prophy 8/25             -antiplatelet therapy: n/a  3. Pain Management/headache: ongoing             -Tylenol 1000 mg TID scheduled and DC hydrocodone 8/22 -> Fioricet 1 tab TID + Tylenol 500 mg BID PRN for HA, pain 8/25 -> Fioricet/tylenol PRN 8/28; restart scheduled as not getting PRNs despite ongoing HA 8/29            - Gabapentin 100 mg BID for persistent HA started 8/28 -> DC 8/29 d/t worsening cognition  4. Mood/Behavior/Sleep - improved:              -pt seems fairly up beat and wife is very supportive             -antipsychotic agents: n/a             - 8/26 Altered sleep wake cycle- trazodone increase trazodone to '75mg'$  for sleep-> 100 mg 8/29                           - Retimed to 1900 9/1                   5. Neuropsych/cognition: This patient is not quite capable of  making decisions on his own  behalf.            - Given persistent slow processing and attention despite delirum workup, in setting of TBI, will trial Ritalin 5 mg BID 8/29 to improve arousal and participation -> reduce to 5 mg daily d/t nighttime hyperarrousal 8/31  6. Skin/Wound Care: local care to scalp, removed staples 8/25    7. Fluids/Electrolytes/Nutrition: encourage appropriate po intake             -liberalized him to regular diet to help with po intake. Encouraged wife to bring food from home as well. Poor po intake so far however.              -check labs Monday - stable  8. Seizure prophylaxis: keppra '500mg'$  q 12 hours -> DC 8/23 9. Hx of Prostate Cancer, c/b urinary incontinence - ongoing             -flomax 0.'4mg'$  bid, proscar '5mg'$  daily              -emptying with intermittent incontinence thus far              - UA 8/23 without s/s infection             - Discussed incontinence of bowel and bladder with family in detail 8/25; they feel he is now close to his baseline, which is intermittently incontinent of both                  10. HTN: well controlled             -hctz/lisinopril daily per home regimen; monitor for orthostasis  11. Osteoarthritis bilateral knees, R>L             -voltaren gel to both knees - increased 8/25               - Discussing RLE bracing with PT for support and sensory feedback 8/25             -his knee pain certainly was contributing to his recent falls and balance deficits in the months prior to this admit             - voltaren gel to b/l knees 4 g 8/25            - Will get xray R knee 8/31, plan for steroid injection 9/1  12. Delirium, likely multifactorial etiology (post-hospitalization, polypharmacy, TBI) - stable - Ensure wearing hearing aides; wife has microphone available to amplify  - UA 8/23 stable; CBC 8/21-22 stable, no leukocytosis - Delirium precautions added and discussed with family - Keppra may be contributing, DC 8/23  - repeat CT head  - ordered 8/24 -  stable - initiate sleep log 8/24 - reinforce 8/29 - DC PRN compazine and zofran, not using and has cog s/e  13.  Constipation - LBM 8/31 - PRN suppository 8/27, with results - Daily Miralax + 1x dose 8/29 - Sennakot-S 1 TAB QHS 8/28 -> BID 8/29 - PRN suppository 8/29 - +results  Hypokalemia: resolved -K+ 3.3 , KCL supplement -> 3.7 8/28  LOS: 13 days A FACE TO FACE EVALUATION WAS PERFORMED  Est Dispo: Tuesday, Sept 12th to home with family assistance  Brandon [name] 01/12/2022, 11:30 AM   PROCEDURE: R knee injection Diagnosis: R knee pain, arthritis Goals with treatment: [ x] Decrease pain [ x] Improve Active / Passive ROM [x ] Improve ADLs [x ]  Improve functional mobility  MEDICATION:  [ x] Kenalog 40 mg/mL  [ x] Lidocaine 1%    CONSENT: Obtained in writing. Consent uploaded to chart.  Benefits discussed.  Risks discussed included, but were not limited to, pain and discomfort, bleeding, bruising, allergic reaction, infection. All questions answered to patient/family member/guardian/ caregiver satisfaction. They would like to proceed with procedure. There are no noted contraindications to procedure.  PROCEDURE No heat sources No antibiotics  The patient was explained about both the benefits and risks of a R knee injection. After the patient acknowledged an understanding of the risks and benefits, the patient agreed to proceed. The area was first marked and then prepped in an aseptic fashion with  alcohol. A 27g, 1.5 inch needle was directed via a inferiomedial approach into the knee joint. The injection was completed with Kenalog 40 mg mixed with 3cc of 1% lidocaine after no blood was aspirated on pull back.  The pt tolerated the procedure well. The patient reported good relief of the pain and improved pain free range of motion on ambulation one hour later. No complications were encountered. The patient tolerated the procedure well.   Brandon Gowda, DO 01/12/2022

## 2022-01-12 NOTE — Progress Notes (Signed)
Occupational Therapy Session Note  Patient Details  Name: Brandon [name] MRN: [id number] Date of Birth: [birth year]  Today's Date: 01/12/2022 OT Individual Time: 4656-8127 OT Individual Time Calculation (min): 77 min    Short Term Goals: Week 2:  OT Short Term Goal 1 (Week 2): Pt will increase toilet transfer to Mod A x1 using RW OT Short Term Goal 2 (Week 2): Pt will complete sit to stand with RW with Mod Assist x1. OT Short Term Goal 3 (Week 2): Marland KitchenPt will demonstrate improved sitting balance and activity tolerance while completing self care tasks seated on EOB with Min A to maintain balance  Skilled Therapeutic Interventions/Progress Updates:  Pt awake up in TIS w/c with spouse present upon OT arrival to the room. Pt reports after significantly increased processing time, "He sings [employer]," when therapist asks about pt's music interest. 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 Pt reports, "Yeah, he's cool."Pt in agreement for OT session.  Therapy Documentation Precautions:  Precautions Precautions: Fall Restrictions Weight Bearing Restrictions: No Vital Signs: Please see "Flowsheet" for most recent vitals charted by nursing staff.  Pain: Pain Assessment Pain Scale: 0-10 Pain Score: 4  Pain Type: Chronic pain Pain Location: Back Pain Orientation: Lower Pain Descriptors / Indicators: Aching Pain Onset: On-going Pain Intervention(s): Emotional support;Distraction Multiple Pain Sites: No  ADL: Pt declines need to perform ADL's at this time. Pt already dressed and up in TIS w/c.   BUE Endurance & Sustained Attention: Pt participates in therapeutic activity in order to improve BUE strength/endurance skills, sustained attention to task, and challenging selective attention/dual tasking. Pt participates in BUE cycling on [employer] for various cycles while seated in the w/c. On the first trial, pt demo's increased difficulty with attention to task at pt was able  to cycle for a 5 minute period with frequent deviations from task, rest breaks, and requiring maximal VC's for sustained participate/attention to task. On second trial, pt able to participate in forward cycling for 3 minutes again with various deviations and requiring maximal VC's for sustained attention and participation in task. On third trial, pt instructed to cycle for as long as pt is able prior to fatigue instead of setting a time limit. Pt able to participate in forward cycling for ~50 seconds prior to experiencing fatigue and deviations from task. Pt requires rest breaks in between all trials due to fatigue.   Functional Mobility & Standing Balance: Pt participates in therapeutic activity in order to improve functional transfers and standing balance which is needed for improved independence with functional mobility. Pt able to complete 2 sit <> stand transfers from w/c level with minimal assistance needed for force production for the transition with BUE push-up from arm rests. On first trial, pt reports minimal dizziness. Pt returned to seated in w/c and BP was taken (100/77). Pt then in agreement to trailing standing again. Pt able to stand again with minimal assistance for OT to take orthostatic vitals. Pt able to maintain standing balance with CGA for safety while OT takes vitals. Pt did become hypotensive when standing (77/48). Pt returned to sitting in w/c for safety as pt overall asymptomatic during this standing trial. OT consulted with physician and physician reports plans to place order for TEDs and abdominal binder to assist with managing orthostatic hypotension.   Cognitive Therapeutic Activity: Pt participates in therapeutic activity in order to focus on sustained attention, visual perceptual skills, and following commands/recall of instructions. Pt provided with playing  cards with a paper with Velcro to match. Pt able to match various suits and colors of cards with minimal VC's for proper  matching. However, pt then able to progress to completing tasks with no VC's and matching cards independently with increased practice.   Pt requested to stay in the w/c at end of session. Pt left sitting comfortably in the w/c with personal belongings and call light within reach, belt alarm placed and activated, and comfort needs attended to. Pt tiled to improve safety in the room.   Therapy/Group: Individual Therapy  Barbee Shropshire 01/12/2022, 12:54 PM

## 2022-01-12 NOTE — Progress Notes (Signed)
Speech Language Pathology TBI Note  Patient Details  Name: Brandon [name] MRN: [id number] Date of Birth: Jan 02, 1946  Today's Date: 01/12/2022 SLP Individual Time: 0930-1030 SLP Individual Time Calculation (min): 60 min  Short Term Goals: Week 2: SLP Short Term Goal 1 (Week 2): Pt will sustain his attention to basic, familiar tasks for 3-5 minute intervals wtih mod cues for redirection. SLP Short Term Goal 2 (Week 2): Pt will utilize external aids to recall orientation information with mod verbal and visual cues. SLP Short Term Goal 3 (Week 2): Patient will self-monitor and correct errors at the word level during a structured language task with Mod A multimodal cues. SLP Short Term Goal 4 (Week 2): Patient will verbally express his basic wants/needs at the phrase level with Mod A multimodal cues.  Skilled Therapeutic Interventions: Skilled treatment session focused on cognitive-linguistic goals. Upon arrival, patient was awake, alert and agreeable to treatment session. Patient sat EOB with Min A and was transferred to the wheelchair via the [facility] with +2 assist for safety. SLP facilitated session by providing Max A verbal and visual cues for working memory and error awareness while transferring a football schedule into a daily calendar. Patient initiated conversation regarding his family and demonstrated difficulty with recall of names of his grandchildren requiring overall Max A phonemic and visual cues. Overall, patient continues to demonstrate improved word-finding at the sentence level with increased ability to self-monitor and correct phonemic errors. Patient left upright in wheelchair with his wife present. Continue with current plan of care.      Pain No/Denies Pain   Agitated Behavior Scale: TBI Observation Details Observation Environment: CIR Start of observation period - Date: 01/12/22 Start of observation period - Time: 0930 End of observation period - Date: 01/12/22 End of  observation period - Time: 1030 Agitated Behavior Scale (DO NOT LEAVE BLANKS) Short attention span, easy distractibility, inability to concentrate: Present to a slight degree Impulsive, impatient, low tolerance for pain or frustration: Present to a slight degree Uncooperative, resistant to care, demanding: Absent Violent and/or threatening violence toward people or property: Absent Explosive and/or unpredictable anger: Absent Rocking, rubbing, moaning, or other self-stimulating behavior: Absent Pulling at tubes, restraints, etc.: Absent Wandering from treatment areas: Absent Restlessness, pacing, excessive movement: Absent Repetitive behaviors, motor, and/or verbal: Absent Rapid, loud, or excessive talking: Absent Sudden changes of mood: Absent Easily initiated or excessive crying and/or laughter: Absent Self-abusiveness, physical and/or verbal: Absent Agitated behavior scale total score: 16  Therapy/Group: Individual Therapy  Arpan Eskelson 01/12/2022, 1:18 PM

## 2022-01-12 NOTE — Progress Notes (Signed)
Physical Therapy TBI Note  Patient Details  Name: Brandon [name] MRN: [id number] Date of Birth: 1945-09-16  Today's Date: 01/12/2022 PT Individual Time: 1300-1400 PT Individual Time Calculation (min): 60 min   Short Term Goals: Week 1:  PT Short Term Goal 1 (Week 1): Pt will transfer sup to sit w/ min A. PT Short Term Goal 1 - Progress (Week 1): Progressing toward goal PT Short Term Goal 2 (Week 1): Pt will transfer sit to stand w/ min A PT Short Term Goal 2 - Progress (Week 1): Progressing toward goal PT Short Term Goal 3 (Week 1): Pt will amb x 50' w/ RW and min A PT Short Term Goal 3 - Progress (Week 1): Progressing toward goal PT Short Term Goal 4 (Week 1): Pt will assess stairs. PT Short Term Goal 4 - Progress (Week 1): Progressing toward goal Week 2:  PT Short Term Goal 1 (Week 2): Pt will complete bed mobility consistently with minA. PT Short Term Goal 2 (Week 2): Pt will complete sit to stand transfer consistently with minA. PT Short Term Goal 3 (Week 2): Pt will perform bed to chair transfer consistently with minA. PT Short Term Goal 4 (Week 2): Pt will ambulate x50' with modA +2 and LRAD. Week 3:     Skilled Therapeutic Interventions/Progress Updates:   Pt initially supine, C/o fatigue but willing to participate w/encouragement.  Appears a bit "washed out".   Supine to sit w/min assist and use of rails.  Therapist began assisting w/shoes/realized need for TEDs.  Sit to supine w/min assist and rails, cues. Teds Donned but effectiveness may be limited due to fit (very long tibia/thin legs)  Supine BP w/TEDS  130/82 Sitting BP w/TEDS 135/100 Squat pivot to wc w/min assist, set up required/pt does not realize armrest needs to be flipped back in prep for transfer.  Transported to gym. Gait trials: 34f, 359f 25105f/RW, wc follow, min assist of 2 but can be mod when suddenly attempts to sit/no wc immediately behind pt/poor awareness. Cues to increase step length and clearance,  cues for upright gaze/does not maintain/reurnt to downward gaze, overall decreased but present bilat hip IR and mild flexion at knees thru stance, narrow base. Much improved midline today w/gait.  Fatigues quickly/appears tired.   BP following gait trials 120/88.  Transported to room.  Short distance gait x [id number] wc to head of bed w/min assist of 2 but max assist for turn/sit to edge of bed as pt attempts to sit prior to turning and backing to bed.    Sit to supine w/min assist, rails, cues. Supine scoot max cueing/poor planning w/task. Pt left supine w/rails up x 3, alarm set, bed in lowest position, and needs in reach.  Wife at bedside.   Therapy Documentation Precautions:  Precautions Precautions: Fall Restrictions Weight Bearing Restrictions: No Pain: Pain Assessment Pain Scale: 0-10 Pain Score: 4  Pain Type: Chronic pain Pain Location: Back Pain Orientation: Lower Pain Descriptors / Indicators: Aching Pain Onset: On-going Pain Intervention(s): Emotional support;Distraction Multiple Pain Sites: No Agitated Behavior Scale: TBI Observation Details Observation Environment: (P) CIR Start of observation period - Date: (P) 01/12/22 Start of observation period - Time: (P) 1300 End of observation period - Date: (P) 01/12/22 End of observation period - Time: (P) 1400 Agitated Behavior Scale (DO NOT LEAVE BLANKS) Short attention span, easy distractibility, inability to concentrate: (P) Present to a moderate degree Impulsive, impatient, low tolerance for pain or frustration: (P) Absent Uncooperative,  resistant to care, demanding: (P) Absent Violent and/or threatening violence toward people or property: (P) Absent Explosive and/or unpredictable anger: (P) Absent Rocking, rubbing, moaning, or other self-stimulating behavior: (P) Absent Pulling at tubes, restraints, etc.: (P) Absent Wandering from treatment areas: (P) Absent Restlessness, pacing, excessive movement: (P)  Absent Repetitive behaviors, motor, and/or verbal: (P) Absent Rapid, loud, or excessive talking: (P) Absent Sudden changes of mood: (P) Absent Easily initiated or excessive crying and/or laughter: (P) Absent Self-abusiveness, physical and/or verbal: (P) Absent Agitated behavior scale total score: (P) 16    Therapy/Group: Individual Therapy Callie Fielding, PT   Jerrilyn Cairo 01/12/2022, 2:19 PM

## 2022-01-13 NOTE — Progress Notes (Signed)
PROGRESS NOTE   Subjective/Complaints:   Pt reports no pain today Doesn't remember last Bowel movement.  Wants breakfast.   Chronic confusion noted.   ROS:  Limited by cognition   Objective:  Recent Labs    01/12/22 0751  WBC 5.3  HGB 12.1*  HCT 34.4*  PLT 223   Recent Labs    01/12/22 0751  NA 138  K 3.4*  CL 104  CO2 27  GLUCOSE 114*  BUN 13  CREATININE 0.98  CALCIUM 8.9     Intake/Output Summary (Last 24 hours) at 01/13/2022 1720 Last data filed at 01/13/2022 1255 Gross per 24 hour  Intake 593 ml  Output 300 ml  Net 293 ml       Physical Exam: Vital Signs Blood pressure 119/65, pulse 84, temperature 98.1 F (36.7 C), resp. rate 18, height 6' 6.5" (1.994 m), weight 102.2 kg, SpO2 95 %.    General: awake, alert, appropriate, sititng up asking for breakfast; denies pain; NAD HENT: conjugate gaze; oropharynx moist CV: regular rate; no JVD Pulmonary: CTA B/L; no W/R/R- good air movement GI: soft, NT, ND, (+)BS Psychiatric: appropriate- calm Neurological: alert, but chronic confusion Extremities: No clubbing, cyanosis, or edema.   Psych: Pt's affect is appropriate. Pt is cooperative. Skin: crani incisions cdi, s/p staple removal.  Healing.  Neuro: CN 2-12 grossly intact. Follows all commands,  Musculoskeletal: Moves all 4 extremities antigravity in bed and against resistance.    Assessment/Plan: 1. Functional deficits which require 3+ hours per day of interdisciplinary therapy in a comprehensive inpatient rehab setting. Physiatrist is providing close team supervision and 24 hour management of active medical problems listed below. Physiatrist and rehab team continue to assess barriers to discharge/monitor patient progress toward functional and medical goals  Care Tool:  Bathing    Body parts bathed by patient: Face, Front perineal area   Body parts bathed by helper: Right arm, Left arm,  Chest, Abdomen, Buttocks, Right upper leg, Left upper leg, Right lower leg, Left lower leg     Bathing assist Assist Level: Maximal Assistance - Patient 24 - 49%     Upper Body Dressing/Undressing Upper body dressing   What is the patient wearing?: Pull over shirt    Upper body assist Assist Level: Maximal Assistance - Patient 25 - 49%    Lower Body Dressing/Undressing Lower body dressing      What is the patient wearing?: Pants, Incontinence brief     Lower body assist Assist for lower body dressing: 2 Helpers     Toileting Toileting    Toileting assist Assist for toileting: Total Assistance - Patient < 25%     Transfers Chair/bed transfer  Transfers assist  Chair/bed transfer activity did not occur: Safety/medical concerns  Chair/bed transfer assist level: 2 Helpers     Locomotion Ambulation   Ambulation assist      Assist level: Moderate Assistance - Patient 50 - 74% Assistive device: Walker-rolling Max distance: 6   Walk 10 feet activity   Assist     Assist level: Moderate Assistance - Patient - 50 - 74%     Walk 50 feet activity   Assist Walk 50  feet with 2 turns activity did not occur: Safety/medical concerns         Walk 150 feet activity   Assist Walk 150 feet activity did not occur: Safety/medical concerns         Walk 10 feet on uneven surface  activity   Assist Walk 10 feet on uneven surfaces activity did not occur: Safety/medical concerns         Wheelchair     Assist Is the patient using a wheelchair?: Yes (per PT LTGs, pt to propel w/c at DC; note plan for w/c propulsion) Type of Wheelchair: Manual Wheelchair activity did not occur: Safety/medical concerns         Wheelchair 50 feet with 2 turns activity    Assist    Wheelchair 50 feet with 2 turns activity did not occur: Safety/medical concerns       Wheelchair 150 feet activity     Assist  Wheelchair 150 feet activity did not occur:  Safety/medical concerns       Blood pressure 119/65, pulse 84, temperature 98.1 F (36.7 C), resp. rate 18, height 6' 6.5" (1.994 m), weight 102.2 kg, SpO2 95 %.  Medical Problem List and Plan: 1. Functional deficits secondary to bilateral SDH's after fall s/p bilateral burr hole evacuation 8/14             -patient may shower             -ELOS/Goals: 12-14 days, supervision to mod I goals with PT, OT, SLP  Continue CIR- PT, OT and SLP   2.  Antithrombotics: -DVT/anticoagulation:  Mechanical:  Antiembolism stockings, knee (TED hose) Bilateral lower extremities Sequential compression devices, below knee Bilateral lower extremities             - CT head 8/24 stable -> Lovenox 40 IM mg daily for DVT prophy 8/25             -antiplatelet therapy: n/a  3. Pain Management/headache: ongoing             -Tylenol 1000 mg TID scheduled and DC hydrocodone 8/22 -> Fioricet 1 tab TID + Tylenol 500 mg BID PRN for HA, pain 8/25 -> Fioricet/tylenol PRN 8/28; restart scheduled as not getting PRNs despite ongoing HA 8/29            - Gabapentin 100 mg BID for persistent HA started 8/28 -> DC 8/29 d/t worsening cognition  9/2- denies pain today injection likely was extremely helpful 4. Mood/Behavior/Sleep - improved:              -pt seems fairly up beat and wife is very supportive             -antipsychotic agents: n/a             - 8/26 Altered sleep wake cycle- trazodone increase trazodone to '75mg'$  for sleep-> 100 mg 8/29                           - Retimed to 1900 9/1                   5. Neuropsych/cognition: This patient is not quite capable of making decisions on his own behalf.            - Given persistent slow processing and attention despite delirum workup, in setting of TBI, will trial Ritalin 5 mg BID 8/29 to improve arousal and participation -> reduce  to 5 mg daily d/t nighttime hyperarrousal 8/31  9/2 no reports of poor sleep overnight.  6. Skin/Wound Care: local care to scalp, removed  staples 8/25    7. Fluids/Electrolytes/Nutrition: encourage appropriate po intake             -liberalized him to regular diet to help with po intake. Encouraged wife to bring food from home as well. Poor po intake so far however.              -check labs Monday - stable  8. Seizure prophylaxis: keppra '500mg'$  q 12 hours -> DC 8/23 9. Hx of Prostate Cancer, c/b urinary incontinence - ongoing             -flomax 0.'4mg'$  bid, proscar '5mg'$  daily              -emptying with intermittent incontinence thus far              - UA 8/23 without s/s infection             - Discussed incontinence of bowel and bladder with family in detail 8/25; they feel he is now close to his baseline, which is intermittently incontinent of both                  10. HTN: well controlled             -hctz/lisinopril daily per home regimen; monitor for orthostasis  11. Osteoarthritis bilateral knees, R>L             -voltaren gel to both knees - increased 8/25               - Discussing RLE bracing with PT for support and sensory feedback 8/25             -his knee pain certainly was contributing to his recent falls and balance deficits in the months prior to this admit             - voltaren gel to b/l knees 4 g 8/25            - Will get xray R knee 8/31, plan for steroid injection 9/1  12. Delirium, likely multifactorial etiology (post-hospitalization, polypharmacy, TBI) - stable - Ensure wearing hearing aides; wife has microphone available to amplify  - UA 8/23 stable; CBC 8/21-22 stable, no leukocytosis - Delirium precautions added and discussed with family - Keppra may be contributing, DC 8/23  - repeat CT head  - ordered 8/24 - stable - initiate sleep log 8/24 - reinforce 8/29 - DC PRN compazine and zofran, not using and has cog s/e  9/2- still confused- but not agitated this AM 13.  Constipation - LBM 8/31  9/2- if no BM by tomorrow, will need intervention - PRN suppository 8/27, with results - Daily  Miralax + 1x dose 8/29 - Sennakot-S 1 TAB QHS 8/28 -> BID 8/29 - PRN suppository 8/29 - +results  Hypokalemia: resolved -K+ 3.3 , KCL supplement -> 3.7 8/28  LOS: 14 days A FACE TO FACE EVALUATION WAS PERFORMED  Est Dispo: Tuesday, Sept 12th to home with family assistance  Courtney Heys 01/13/2022, 5:20 PM   PROCEDURE: R knee injection Diagnosis: R knee pain, arthritis Goals with treatment: [ x] Decrease pain [ x] Improve Active / Passive ROM [x ] Improve ADLs [x ] Improve functional mobility  MEDICATION:  [ x] Kenalog 40 mg/mL  [ x] Lidocaine 1%    CONSENT:  Obtained in writing. Consent uploaded to chart.  Benefits discussed.  Risks discussed included, but were not limited to, pain and discomfort, bleeding, bruising, allergic reaction, infection. All questions answered to patient/family member/guardian/ caregiver satisfaction. They would like to proceed with procedure. There are no noted contraindications to procedure.  PROCEDURE No heat sources No antibiotics  The patient was explained about both the benefits and risks of a R knee injection. After the patient acknowledged an understanding of the risks and benefits, the patient agreed to proceed. The area was first marked and then prepped in an aseptic fashion with  alcohol. A 27g, 1.5 inch needle was directed via a inferiomedial approach into the knee joint. The injection was completed with Kenalog 40 mg mixed with 3cc of 1% lidocaine after no blood was aspirated on pull back.  The pt tolerated the procedure well. The patient reported good relief of the pain and improved pain free range of motion on ambulation one hour later. No complications were encountered. The patient tolerated the procedure well.   Courtney Heys, MD 01/13/2022

## 2022-01-13 NOTE — Progress Notes (Signed)
Physical Therapy Session Note  Patient Details  Name: Brandon [name] MRN: [id number] Date of Birth: 1945/08/28  Today's Date: 01/13/2022 PT Individual Time: 0806-0902 PT Individual Time Calculation (min): 56 min   Short Term Goals: Week 2:  PT Short Term Goal 1 (Week 2): Pt will complete bed mobility consistently with minA. PT Short Term Goal 2 (Week 2): Pt will complete sit to stand transfer consistently with minA. PT Short Term Goal 3 (Week 2): Pt will perform bed to chair transfer consistently with minA. PT Short Term Goal 4 (Week 2): Pt will ambulate x50' with modA +2 and LRAD.  Skilled Therapeutic Interventions/Progress Updates:    Pt received supine in bed, HOB elevated, with his son, Jonni Sanger, present and pt agreeable to therapy session. Therapist utilizing pt's microphone connected to his hearing aids due to his [facility] - requires max assist to put in hearing aides correctly. Donned B LE knee high TED hose total assist.  Supine>sitting L EOB, HOB max elevated and using bedrails, with min assist transitioned to heavy mod to total assist for sitting balance EOB due to repeated posterior LOB that worsened with any dual-task activity.  Sitting EOB with mod to total assist for sitting balance with pt repeatedly having posterior LOB with delayed awareness of it and very delayed initiation of correction during max assist to thread on LB clothing and then requiring +2 to don shoes due to pt's consistent posterior LOB then max assist to don clean shirt. Pt with poor awareness attempting to tie his shorts before pulling them up over his hips.   Sit>stand from elevated EOB>RW with mod assist of 1 for balance and lifting and +2 min assist for balance and to stabilize AD - requires max assist to pull shorts up over hips due to continued posterior lean/LOB with delayed awareness and +2 providing dependent assist to tie pt's shorts.  R lateral scoot transfer EOB>TIS w/c with mod assist primarily to  facilitate anterior trunk flexion to allow pt improved ability to lift and scoot hips with cuing for improvement.   Transported to/from gym in w/c for time management and energy conservation.  Sit>stands TIS w/c>RW with min/mod assist of 1 for lifting/balance during session - pt with no recall of need to push up from w/c armrests.   Gait training ~12f x2 + ~468fx2 (seated break between each) using RW with CLOSE +2 w/c follow due to pt spontaneously initiating return to sitting when he becomes fatigued - pt demos the below gait deviations/impairments:  - very narrow BOS starting off with reciprocal stepping pattern but after ~1587ft becomes fatigued having gradually shorter step lengths (R more impaired than L) with poorer foot clearance and a progressively more forward crouched trunk posturing  Pt benefited from having a target goal to walk towards to increase gait distance.  Vitals after gait: BP 107/75 (MAP 87), HR 93bpm with pt denying any symptoms  Sit>stand in // bars with min assist. Standing in // bars with B UE support perform alternate BLE foot taps to 6" step targeting B LE hip flexor strengthening for improved foot clearance and step length x15 reps. Attempted to progress to repeated step up/down forward/backwards on/off 6" step with  BUE support on // bars but pt only able to perform 1 step-up leading with  RLE piror to becoming fatigued and spontaneously initiating sitting back into w/c. Pt did report that stepping up was very challenging and made him realize how weak his LEs had become.  Therapist educated pt on need to continue addressing this throughout CIR.   Transported back to room and pt left seated tiled back in TIS w/c with needs in reach, seat belt alarm on, and his son present.    Therapy Documentation Precautions:  Precautions Precautions: Fall Restrictions Weight Bearing Restrictions: No   Pain:  No reports of pain throughout session.    Therapy/Group:  Individual Therapy  Tawana Scale , PT, DPT, NCS, CSRS 01/13/2022, 7:50 AM

## 2022-01-14 MED ORDER — ALUM & MAG HYDROXIDE-SIMETH 200-200-20 MG/5ML PO SUSP
30.0000 mL | Freq: Four times a day (QID) | ORAL | Status: DC | PRN
  Administered 2022-01-14: 30 mL via ORAL
  Filled 2022-01-14: qty 30

## 2022-01-14 MED ORDER — ALUM & MAG HYDROXIDE-SIMETH 200-200-20 MG/5 ML NICU TOPICAL
1.0000 | TOPICAL | Status: DC | PRN
  Filled 2022-01-14: qty 355

## 2022-01-14 NOTE — Progress Notes (Signed)
Speech Language Pathology Daily Session Note  Patient Details  Name: Brandon [name] MRN: [id number] Date of Birth: August 11, 1945  Today's Date: 01/14/2022 SLP Individual Time: 4098-1191 SLP Individual Time Calculation (min): 55 min  Short Term Goals: Week 2: SLP Short Term Goal 1 (Week 2): Pt will sustain his attention to basic, familiar tasks for 3-5 minute intervals wtih mod cues for redirection. SLP Short Term Goal 2 (Week 2): Pt will utilize external aids to recall orientation information with mod verbal and visual cues. SLP Short Term Goal 3 (Week 2): Patient will self-monitor and correct errors at the word level during a structured language task with Mod A multimodal cues. SLP Short Term Goal 4 (Week 2): Patient will verbally express his basic wants/needs at the phrase level with Mod A multimodal cues.  Skilled Therapeutic Interventions:  Pt was seen for skilled ST targeting cognitive-linguistic goals.  Upon arrival, pt was in bed, awake, alert, and agreeable to participating in treatment.  Pt expressed that he wanted to get out of his room because he was "bored" so pt was transferred to wheelchair via [facility] lift and transported to [facility] treatment room.  SLP facilitated the session with a basic picture description task targeting safety awareness and verbal expression.  Pt did best identifying and describing safety concerns when there was a concrete clue in the picture (ie A picture of a "low fuel" alert on a car's dashboard versus someone not wearing oven mitts reaching into the oven for something hot) but overall needed mod-max assist to generate phrase level responses.  His spontaneous responses to immediately relevant questions were much more fluent, if not slightly vague and circumlocutory.  Pt also required max to total assist to sequence 4 step picture cards in order.  Pt appeared aware of his challenges during today's session stating "I don't think I gave you everything you were asking for."   Pt was returned to room and left in bed with son at bedside.  Continue per current plan of care.     Pain Pain Assessment Pain Scale: 0-10 Pain Score: 0-No pain  Therapy/Group: Individual Therapy  Jawon Dipiero, Selinda Orion 01/14/2022, 5:01 PM

## 2022-01-14 NOTE — Progress Notes (Signed)
Physical Therapy TBI Note  Patient Details  Name: Brandon Brandon [name] MRN: [id number] Date of Birth: [birth year]  Today's Date: 01/14/2022 PT Individual Time: 0905-1005 PT Individual Time Calculation (min): 60 min   Short Term Goals: Week 2:  PT Short Term Goal 1 (Week 2): Pt will complete bed mobility consistently with minA. PT Short Term Goal 2 (Week 2): Pt will complete sit to stand transfer consistently with minA. PT Short Term Goal 3 (Week 2): Pt will perform bed to chair transfer consistently with minA. PT Short Term Goal 4 (Week 2): Pt will ambulate x50' with modA +2 and LRAD.  Skilled Therapeutic Interventions/Progress Updates:     Patient in bed with NT performing peri-care and lower body clothing management with Brandon Brandon [name] at bedside upon PT arrival. Patient alert and agreeable to PT session. Patient denied pain during session.  Patient appropriately requested to be "covered" when PT arrived, and asked PT to wait until Brandon pants were on with mild frustration. PT pulled the curtain and stood behind the curtain until NT completed dressing bed level. Patient without further frustration throughout session.   Patient's Brandon [name] reports patient had a "great night's sleep!" Patient in good spirits, joking, and appropriate language skills with intermittent reduced word recall. Utilized patient's hearing aids with microphone to improve patient/staff communication due to patient HOH.  Therapeutic Activity: Bed Mobility: Patient performed supine to sit with CGA-min A for forward trunk motion when coming to sitting. Provided verbal cues for initiation, use of elbows to push up, and scooting forward to bring Brandon feet to the floor and shift Brandon trunk forward. Transfers: Patient performed lateral scoot bed>w/c and w/c>mat table with CGA with cues for initiation, hand placement, and foot placement. He performed blocked practice for improved recall of technique/sequencing for sit to stands from lowest height on  mat table using RW with min A progressing to CGA x5. Focused on scooting forward, pushing up and reaching back, and forward weight shift into Brandon toes (walker placed slightly ahead to promote this). Progressed from max to min cues during repetitions. He formed sit to/from stand x3, as above, with CGA using RW with min cues for technique.   Gait Training:  Patient ambulated 74 feet and 84 feet using RW with min A with +2 w/c follow for safety focused only on forward walking for improved step length/height R>L and activity tolerance. He then ambulated 70 feet with x2 turns without +2 focused on functional ambulation with CGA-min A using RW. Ambulated with crouched, shuffling gait R>L with mild R veering. Provided facilitation for steering straight ahead, management of RW on turns, and forward propulsion, with continues multimodal cues for increased step length. Step length immediately reduced without cues.  Therapeutic Exercise: Patient performed the following exercises with verbal and tactile cues for proper technique. -TIS w/c propulsion with B lower extremities for increased strength/endurance with a reciprocal task >150 feet; required increased time and x2 rest breaks due to internal distraction and fatigue with task  Patient in TIS w/c with Brandon Brandon [name] in the room at end of session with breaks locked, seat belt alarm set, and all needs within reach.   Therapy Documentation Precautions:  Precautions Precautions: Fall Restrictions Weight Bearing Restrictions: No Agitated Behavior Scale: TBI Observation Details Observation Environment: CIR Start of observation period - Date: 01/14/22 Start of observation period - Time: 0805 End of observation period - Date: 01/14/22 End of observation period - Time: 0905 Agitated Behavior Scale (DO NOT LEAVE BLANKS) Short  attention span, easy distractibility, inability to concentrate: Present to a slight degree Impulsive, impatient, low tolerance for pain or  frustration: Absent Uncooperative, resistant to care, demanding: Absent Violent and/or threatening violence toward people or property: Absent Explosive and/or unpredictable anger: Absent Rocking, rubbing, moaning, or other self-stimulating behavior: Absent Pulling at tubes, restraints, etc.: Absent Wandering from treatment areas: Absent Restlessness, pacing, excessive movement: Absent Repetitive behaviors, motor, and/or verbal: Absent Rapid, loud, or excessive talking: Absent Sudden changes of mood: Absent Easily initiated or excessive crying and/or laughter: Absent Self-abusiveness, physical and/or verbal: Absent Agitated behavior scale total score: 15    Therapy/Group: Individual Therapy  Brandon Brandon [name] L Partick Musselman PT, DPT, NCS, CBIS  01/14/2022, 9:13 AM

## 2022-01-15 MED ORDER — CALCIUM CARBONATE ANTACID 500 MG PO CHEW: 1.0000 | CHEWABLE_TABLET | Freq: Three times a day (TID) | ORAL | Status: DC | PRN

## 2022-01-15 MED ORDER — POLYETHYLENE GLYCOL 3350 17 G PO PACK: 17.0000 g | PACK | Freq: Every day | ORAL | Status: DC | PRN

## 2022-01-15 MED ORDER — SENNOSIDES-DOCUSATE SODIUM 8.6-50 MG PO TABS
1.0000 | ORAL_TABLET | Freq: Every day | ORAL | Status: DC
  Administered 2022-01-17 – 2022-01-19 (×3): 1 via ORAL
  Filled 2022-01-15 (×6): qty 1

## 2022-01-15 NOTE — Progress Notes (Addendum)
PROGRESS NOTE   Subjective/Complaints:  Noted diarrhea overnight due to "that pill they gave me (sennakot)" along with severe indigestion. Resolved this AM. Wife notes he takes TUMs for heartburn at home.  Pt notes much improvement in headaches. Also, very happy with resolution of R knee pain after injection; inquires about second injection, discussed how would not do alternate knee until 2 weeks post-inj and would not repeat same knee within 3 months d/t risks of systemic and local steroids. Pt and wife understanding.   Evening cognition much improved per wife. Sleeping well.  ROS:    Denies fevers, chills, N/V, abdominal pain, constipation, diarrhea, SOB, cough, chest pain, new weakness or paraesthesias.     Objective:  No results for input(s): "WBC", "HGB", "HCT", "PLT" in the last 72 hours.  No results for input(s): "NA", "K", "CL", "CO2", "GLUCOSE", "BUN", "CREATININE", "CALCIUM" in the last 72 hours.    Intake/Output Summary (Last 24 hours) at 01/15/2022 0823 Last data filed at 01/14/2022 2039 Gross per 24 hour  Intake 360 ml  Output 250 ml  Net 110 ml        Physical Exam: Vital Signs Blood pressure 132/71, pulse 83, temperature 97.7 F (36.5 C), temperature source Oral, resp. rate 18, height 6' 6.5" (1.994 m), weight 102.2 kg, SpO2 98 %.    General: awake, alert, appropriate,NAD HENT: conjugate gaze; oropharynx moist CV: regular rate; no JVD Pulmonary: CTA B/L; no W/R/R- good air movement GI: soft, NT, ND, (+)BS Psych: Pt's affect is appropriate. Pt is cooperative. Skin: crani incisions cdi, s/p staple removal.  Healing.  Neuro: CN 2-12 grossly intact. Follows all commands. AAOx4.   Musculoskeletal: Moves all 4 extremities antigravity in bed and against resistance.    Assessment/Plan: 1. Functional deficits which require 3+ hours per day of interdisciplinary therapy in a comprehensive inpatient rehab  setting. Physiatrist is providing close team supervision and 24 hour management of active medical problems listed below. Physiatrist and rehab team continue to assess barriers to discharge/monitor patient progress toward functional and medical goals  Care Tool:  Bathing    Body parts bathed by patient: Face, Front perineal area   Body parts bathed by helper: Right arm, Left arm, Chest, Abdomen, Buttocks, Right upper leg, Left upper leg, Right lower leg, Left lower leg     Bathing assist Assist Level: Maximal Assistance - Patient 24 - 49%     Upper Body Dressing/Undressing Upper body dressing   What is the patient wearing?: Pull over shirt    Upper body assist Assist Level: Maximal Assistance - Patient 25 - 49%    Lower Body Dressing/Undressing Lower body dressing      What is the patient wearing?: Pants, Incontinence brief     Lower body assist Assist for lower body dressing: 2 Helpers     Toileting Toileting    Toileting assist Assist for toileting: Total Assistance - Patient < 25%     Transfers Chair/bed transfer  Transfers assist  Chair/bed transfer activity did not occur: Safety/medical concerns  Chair/bed transfer assist level: Contact Guard/Touching assist     Locomotion Ambulation   Ambulation assist      Assist level: Minimal  Assistance - Patient > 75% Assistive device: Walker-rolling Max distance: 84 ft   Walk 10 feet activity   Assist     Assist level: Minimal Assistance - Patient > 75% Assistive device: Walker-rolling   Walk 50 feet activity   Assist Walk 50 feet with 2 turns activity did not occur: Safety/medical concerns  Assist level: Minimal Assistance - Patient > 75% Assistive device: Walker-rolling    Walk 150 feet activity   Assist Walk 150 feet activity did not occur: Safety/medical concerns         Walk 10 feet on uneven surface  activity   Assist Walk 10 feet on uneven surfaces activity did not occur:  Safety/medical concerns         Wheelchair     Assist Is the patient using a wheelchair?: Yes Type of Wheelchair: Manual Wheelchair activity did not occur: Safety/medical concerns  Wheelchair assist level: Minimal Assistance - Patient > 75% Max wheelchair distance: 150 ft    Wheelchair 50 feet with 2 turns activity    Assist    Wheelchair 50 feet with 2 turns activity did not occur: Safety/medical concerns   Assist Level: Supervision/Verbal cueing   Wheelchair 150 feet activity     Assist  Wheelchair 150 feet activity did not occur: Safety/medical concerns   Assist Level: Minimal Assistance - Patient > 75%   Blood pressure 132/71, pulse 83, temperature 97.7 F (36.5 C), temperature source Oral, resp. rate 18, height 6' 6.5" (1.994 m), weight 102.2 kg, SpO2 98 %.  Medical Problem List and Plan: 1. Functional deficits secondary to bilateral SDH's after fall s/p bilateral burr hole evacuation 8/14             -patient may shower             -ELOS/Goals: 12-14 days, supervision to mod I goals with PT, OT, SLP  Continue CIR- PT, OT and SLP   2.  Antithrombotics: -DVT/anticoagulation:  Mechanical:  Antiembolism stockings, knee (TED hose) Bilateral lower extremities Sequential compression devices, below knee Bilateral lower extremities             - CT head 8/24 stable -> Lovenox 40 IM mg daily for DVT prophy 8/25             -antiplatelet therapy: n/a  3. Pain Management/headache: ongoing             -Tylenol 1000 mg TID scheduled and DC hydrocodone 8/22 -> Fioricet 1 tab TID + Tylenol 500 mg BID PRN for HA, pain 8/25 -> Fioricet/tylenol PRN 8/28; restart scheduled as not getting PRNs despite ongoing HA 8/29            - Gabapentin 100 mg BID for persistent HA started 8/28 -> DC 8/29 d/t worsening cognition  9/2- denies pain today injection likely was extremely helpful 4. Mood/Behavior/Sleep - improved:              -pt seems fairly up beat and wife is very  supportive             -antipsychotic agents: n/a             - 8/26 Altered sleep wake cycle- trazodone increase trazodone to '75mg'$  for sleep-> 100 mg 8/29                           - Retimed to 1900 9/1  - improved sleep and nighttime attention per family  5. Neuropsych/cognition: This patient is not quite capable of making decisions on his own behalf.            - Given persistent slow processing and attention despite delirum workup, in setting of TBI, will trial Ritalin 5 mg BID 8/29 to improve arousal and participation -> reduce to 5 mg daily d/t nighttime hyperarrousal 8/31- improved  6. Skin/Wound Care: local care to scalp, removed staples 8/25    7. Fluids/Electrolytes/Nutrition: encourage appropriate po intake             -liberalized him to regular diet to help with po intake. Encouraged wife to bring food from home as well. Poor po intake so far however.              -check labs Monday - stable  8. Seizure prophylaxis: keppra '500mg'$  q 12 hours -> DC 8/23 9. Hx of Prostate Cancer, c/b urinary incontinence - ongoing             -flomax 0.'4mg'$  bid, proscar '5mg'$  daily              -emptying with intermittent incontinence thus far              - UA 8/23 without s/s infection             - Discussed incontinence of bowel and bladder with family in detail 8/25; they feel he is now close to his baseline, which is intermittently incontinent of both                  10. HTN: well controlled             -hctz/lisinopril daily per home regimen; monitor for orthostasis  11. Osteoarthritis bilateral knees, R>L; radiographic findings indicative of CPPD 8/31               -voltaren gel to both knees - increased 8/25               - Discussing RLE bracing with PT for support and sensory feedback 8/25             -his knee pain certainly was contributing to his recent falls and balance deficits in the months prior to this admit             - voltaren gel to b/l knees 4 g 8/25             - xray R knee 8/31 showing signs of CPPD; discussed with family need for OP workup including joint aspiration             - s/p R knee steroid injection 9/1, with significant pain and gait improvement  12. Delirium, likely multifactorial etiology (post-hospitalization, polypharmacy, TBI) - stable - Ensure wearing hearing aides; wife has microphone available to amplify  - UA 8/23 stable; CBC 8/21-22 stable, no leukocytosis - Delirium precautions added and discussed with family - Keppra may be contributing, DC 8/23  - repeat CT head  - ordered 8/24 - stable - initiate sleep log 8/24 - reinforce 8/29 - DC PRN compazine and zofran, not using and has cog s/e  9/2- still confused- but not agitated this AM 13.  Constipation/diarrhea- LBM 9/3; diarrheal - PRN suppository 8/27, with results - Daily Miralax + 1x dose 8/29 -> PRN 9/4 - Sennakot-S 1 TAB QHS 8/28 -> BID 8/29 -> QHS 9/4 - PRN suppository 8/29 - +results  Hypokalemia:   -K+ 3.3 , KCL  supplement -> 3.7 8/28-> 3.3 9/1 on 40 meq daily - repeat 9/5  LOS: 16 days A FACE TO FACE EVALUATION WAS PERFORMED  Est Dispo: Tuesday, Sept 12th to home with family assistance   Gertie Gowda, DO 01/15/2022

## 2022-01-15 NOTE — Plan of Care (Signed)
  Problem: RH BLADDER ELIMINATION Goal: RH STG MANAGE BLADDER WITH ASSISTANCE Description: STG Manage Bladder With min Assistance Outcome: Not Progressing;incontinence   

## 2022-01-15 NOTE — Progress Notes (Signed)
Physical Therapy Weekly Progress Note  Patient Details  Name: Brandon [name] MRN: [id number] Date of Birth: February 19, 1946  Beginning of progress report period: January 08, 2022 End of progress report period: January 15, 2022  Today's Date: 01/15/2022 PT Individual Time: 1300-1400 PT Individual Time Calculation (min): 60 min   Patient has met 3 of 4 short term goals.  Pt is progressing well toward mobility goals, making substantial gains in independence during current reporting period. Pt performing bed mobility consistently at minA, sit to stand transfers with minA and as little as CGA at times, and ambulating up to 90' with RW and minA +1. Pt continues to have very limited endurance as well as apraxia, weakness and balance deficits. Pt will benefit from family ed prior to discharge.  Patient continues to demonstrate the following deficits muscle weakness, decreased cardiorespiratoy endurance, impaired timing and sequencing, motor apraxia, and decreased coordination, decreased initiation, decreased attention, decreased awareness, decreased problem solving, decreased safety awareness, and decreased memory, and decreased sitting balance, decreased standing balance, decreased postural control, and decreased balance strategies and therefore will continue to benefit from skilled PT intervention to increase functional independence with mobility.  Patient progressing toward long term goals..  Continue plan of care.  PT Short Term Goals Week 2:  PT Short Term Goal 1 (Week 2): Pt will complete bed mobility consistently with minA. PT Short Term Goal 1 - Progress (Week 2): Met PT Short Term Goal 2 (Week 2): Pt will complete sit to stand transfer consistently with minA. PT Short Term Goal 2 - Progress (Week 2): Met PT Short Term Goal 3 (Week 2): Pt will perform bed to chair transfer consistently with minA. PT Short Term Goal 3 - Progress (Week 2): Progressing toward goal PT Short Term Goal 4 (Week 2): Pt  will ambulate x50' with modA +2 and LRAD. PT Short Term Goal 4 - Progress (Week 2): Met Week 3:  PT Short Term Goal 1 (Week 3): STGs = LTGs  Skilled Therapeutic Interventions/Progress Updates:  Ambulation/gait training;Discharge planning;Functional mobility training;Therapeutic Activities;UE/LE Strength taining/ROM;Wheelchair propulsion/positioning;UE/LE Coordination activities;Therapeutic Exercise;Stair training;Patient/family education;Neuromuscular re-education;Community reintegration   Pt received seated in [facility] and agrees to therapy. No complaint of pain. WC transport outside for time management. Pt performs gait training outdoors for practice ambulating over unlevel and varying surfaces. Pt ambulates x50' with RW and cues for upright gaze to improve posture and balance, and increasing stride length (especially on R) due to shuffling gait pattern. Pt also has tendency to ambulate with knees flexed throughout gait cycle, although today pt has improved knee extension relative to this therapist's previous session with the pt. WC transport back inside. Pt verbalizes need to urinate. Pt performs sit to stand with RW and cues for hand placement and body mechanics, but no physical assistance required. Pt is able to urinate while standing up and holding onto RW with one hand. Following rest break, pt ambulates x90' with RW and minA, with same cueing, as well as additional cueing for RW management and sequencing of transfer back to [facility]. Pt requests to return to room despite having 15 minutes left in session. Pt has not responded well to encouragement to partiipcate in the past when he is fatigued, so therapist takes pt back to room. Stand pivot transfer to bed with cues for positioning. Sit to supine with modA. Left with alarm intact and all needs within reach.  Therapy Documentation Precautions:  Precautions Precautions: Fall Restrictions Weight Bearing Restrictions: No  Therapy/Group: Individual  Therapy  Breck Coons, PT, DPT 01/15/2022, 5:14 PM

## 2022-01-15 NOTE — Progress Notes (Signed)
Speech Language Pathology Weekly Progress and Session Note  Patient Details  Name: Brandon [name] MRN: [id number] Date of Birth: 30-Sep-1945  Beginning of progress report period: January 08, 2022 End of progress report period: January 15, 2022  Today's Date: 01/15/2022 SLP Individual Time: 0930-1030 SLP Individual Time Calculation (min): 60 min  Short Term Goals: Week 2: SLP Short Term Goal 1 (Week 2): Pt will sustain his attention to basic, familiar tasks for 3-5 minute intervals wtih mod cues for redirection. SLP Short Term Goal 1 - Progress (Week 2): Met SLP Short Term Goal 2 (Week 2): Pt will utilize external aids to recall orientation information with mod verbal and visual cues. SLP Short Term Goal 2 - Progress (Week 2): Met SLP Short Term Goal 3 (Week 2): Patient will self-monitor and correct errors at the word level during a structured language task with Mod A multimodal cues. SLP Short Term Goal 3 - Progress (Week 2): Met SLP Short Term Goal 4 (Week 2): Patient will verbally express his basic wants/needs at the phrase level with Mod A multimodal cues. SLP Short Term Goal 4 - Progress (Week 2): Met    New Short Term Goals: Week 3: SLP Short Term Goal 1 (Week 3): Pt will sustain his attention to basic, familiar tasks for 10-12 minute intervals wtih mod cues for redirection. SLP Short Term Goal 2 (Week 3): Pt will utilize external aids to recall orientation information with min verbal and visual cues. SLP Short Term Goal 3 (Week 3): Patient will demonstrate functional problem solving with basic and familiar tasks with Mod verbal cues. SLP Short Term Goal 4 (Week 3): Patient will verbally express his basic wants/needs at the phrase level with Mod A multimodal cues. SLP Short Term Goal 5 (Week 3): Patient will self-monitor and correct errors at the word level during a structured language task with Min A multimodal cues.  Weekly Progress Updates: Patient has made functional gains and  has met 4 of 4 STGs this reporting period. Currently, patient requires overall mod-max A verbal and visual cues to complete functional and familiar tasks safely in regards to attention, orientation with use of external aids and functional problem solving. Mod-Max verbal cues are also needed for word-finding and emergent awareness during verbal expression with both structured and unstructured language tasks. Patient and family education ongoing. Patient would benefit from continued skilled SLP intervention to maximize his cognitive-linguistic functioning and overall functional independence prior to discharge.      Intensity: Minumum of 1-2 x/day, 30 to 90 minutes Frequency: 3 to 5 out of 7 days Duration/Length of Stay: 01/23/22 Treatment/Interventions: Cognitive remediation/compensation;Cueing hierarchy;Speech/Language facilitation;Functional tasks;Internal/external aids;Patient/family education;Dysphagia/aspiration precaution training;Therapeutic Activities;Environmental controls   Daily Session  Skilled Therapeutic Interventions:  Skilled treatment session focused on cognitive goals. Upon arrival, patient was awake in bed and eager to participate. Patient sat EOB with supervision while SLP donned his TED hoes and shoes. Patient then transferred to the wheelchair with +2 assist via the [place]. Patient initially with language of confusion but was easily redirected. SLP facilitated session by providing Max A verbal cues for functional problem solving during a mildly complex task in identifying medication administration errors. Min verbal cues were needed for self-monitoring and correcting errors while reading aloud and for comprehension of directions. Patient also participated in an informal conversation regarding football with Min verbal cues for word-finding. Patient left upright in wheelchair with wife present. Continue with current plan of care.      Pain No/Denies Pain  Therapy/Group: Individual  Therapy  Donalyn Schneeberger 01/15/2022, 6:26 AM

## 2022-01-15 NOTE — Progress Notes (Signed)
Occupational Therapy Session Note  Patient Details  Name: Brandon [name] MRN: [id number] Date of Birth: [birth year]  Today's Date: 01/15/2022 OT Individual Time: 1105-1200 OT Individual Time Calculation (min): 55 min    Short Term Goals: Week 2:  OT Short Term Goal 1 (Week 2): Pt will increase toilet transfer to Mod A x1 using RW OT Short Term Goal 2 (Week 2): Pt will complete sit to stand with RW with Mod Assist x1. OT Short Term Goal 3 (Week 2): Marland KitchenPt will demonstrate improved sitting balance and activity tolerance while completing self care tasks seated on EOB with Min A to maintain balance  Skilled Therapeutic Interventions/Progress Updates:    Pt greeted semi-reclined in bed and agreeable to OT treatment session. Pt agreeable to shower today. Pt initiated doffing clothing from wc. Stand-pivot to tub bench in shower with use of grab bar and min A. Pt initiated bathing tasks and asked for assistance appropriately. Overall min A to wash lowe legs and buttocks in standing. Pt impulsive at times to stand before OT had the wc ready, but followed commands for safety. OT educated on lower body dressing techniques using figure 4 position. Pt could thread pant legs this way. Worked on sit<>stands and anterior weight shift when standing to pull up pants. Pt brought to therapy gym and worked on standing balance/endurance with focus on anterior weight shift reaching forward and overhead. Overall Min/CGA for balance. Pt returned to room and  left seated in wc with alarm belt on, call bell in reach, and needs met.   Therapy Documentation Precautions:  Precautions Precautions: Fall Restrictions Weight Bearing Restrictions: No Pain:   Pt denies pain at this time   Therapy/Group: Individual Therapy  Valma Cava 01/15/2022, 12:48 PM

## 2022-01-16 LAB — CBC
HCT: 36.2 % — ABNORMAL LOW (ref 39.0–52.0)
Hemoglobin: 12.1 g/dL — ABNORMAL LOW (ref 13.0–17.0)
MCH: 30 pg (ref 26.0–34.0)
MCHC: 33.4 g/dL (ref 30.0–36.0)
MCV: 89.6 fL (ref 80.0–100.0)
Platelets: 212 10*3/uL (ref 150–400)
RBC: 4.04 MIL/uL — ABNORMAL LOW (ref 4.22–5.81)
RDW: 12.5 % (ref 11.5–15.5)
WBC: 4.4 10*3/uL (ref 4.0–10.5)
nRBC: 0 % (ref 0.0–0.2)

## 2022-01-16 LAB — BASIC METABOLIC PANEL
Anion gap: 5 (ref 5–15)
BUN: 13 mg/dL (ref 8–23)
CO2: 29 mmol/L (ref 22–32)
Calcium: 8.8 mg/dL — ABNORMAL LOW (ref 8.9–10.3)
Chloride: 104 mmol/L (ref 98–111)
Creatinine, Ser: 0.92 mg/dL (ref 0.61–1.24)
GFR, Estimated: >60 mL/min (ref >60)
Glucose, Bld: 137 mg/dL — ABNORMAL HIGH (ref 70–99)
Potassium: 3.9 mmol/L (ref 3.5–5.1)
Sodium: 138 mmol/L (ref 135–145)

## 2022-01-16 MED ORDER — POTASSIUM CHLORIDE CRYS ER 20 MEQ PO TBCR
20.0000 meq | EXTENDED_RELEASE_TABLET | Freq: Every day | ORAL | Status: DC
  Administered 2022-01-17 – 2022-01-22 (×6): 20 meq via ORAL
  Filled 2022-01-16 (×6): qty 1

## 2022-01-16 NOTE — Progress Notes (Addendum)
Patient ID: Brandon [name], male   DOB: 03/02/1946, 76 y.o.   MRN: [id number]  SW went by pt room to give updates from team conference, and d/c date remains 9/12, however, no family in room.   1320-SW left message for pt wife Curt Bears to discuss above, and encouraged follow-up.   *SW will continue to make efforts to make contact with pt wife to discuss HHA preference and scheduling family education.   SW received return phone call from pt wife. SW discussed updates. Fam edu scheduled for Thursday (9/7) 9am-12pm.   Loralee Pacas, MSW, [place] Office: [phone] Cell: [phone] Fax: [phone]

## 2022-01-16 NOTE — Progress Notes (Signed)
Physical Therapy TBI Note  Patient Details  Name: Brandon [name] MRN: [id number] Date of Birth: December 14, 1945  Today's Date: 01/16/2022 PT Individual Time: 1302-1346 PT Individual Time Calculation (min): 44 min   Short Term Goals:  Week 2:  PT Short Term Goal 1 (Week 2): Pt will complete bed mobility consistently with minA. PT Short Term Goal 1 - Progress (Week 2): Met PT Short Term Goal 2 (Week 2): Pt will complete sit to stand transfer consistently with minA. PT Short Term Goal 2 - Progress (Week 2): Met PT Short Term Goal 3 (Week 2): Pt will perform bed to chair transfer consistently with minA. PT Short Term Goal 3 - Progress (Week 2): Progressing toward goal PT Short Term Goal 4 (Week 2): Pt will ambulate x50' with modA +2 and LRAD. PT Short Term Goal 4 - Progress (Week 2): Met Week 3:  PT Short Term Goal 1 (Week 3): STGs = LTGs  Skilled Therapeutic Interventions/Progress Updates:  Patient supine in bed on entrance to room. Patient alert and agreeable to PT session. Wife present. Microphone to pt's hearing aids used with success during session.  Patient with no pain complaint at start of session.  Therapeutic Activity: Bed Mobility: Pt performed supine --> sit with most assist required to overcome posterior bias. Significant continual posterior push despite BUE hand holds to bed features. VC/ tc required throughout for increasing anterior lean with volitional abdominal engagement. VC required for pt to bring Bil hands/ elbows to knees in order to provide improved vc for increased understanding of requested movement. NMR below for continued practice. Transfers: Pt performed sit<>stand and stand pivot transfers throughout session with CGA/ MinA upr to maxA to reach standing balance. Provided vc/ tc for improving technique to attain balance in stance. Improved technique with anterior weight shift folowing NMR with successful anterior weight shift in sitting. Otherwise increased BLE extensor  push into bed with increased posterior lean requiring MaxA to overcome.   Lateral scoot practice with pt unable to perform with breakdown of technique. When allowed to perform on own with simple cueing, pt able to perform for 50% of distance until change of hand positioning. Then sit<>stand performed with lateral step up to [place] to reach target distance.   Neuromuscular Re-ed: NMR facilitated during session with focus on sitting/ standing balance and reduction of posterior push. Pt guided with significant posterior push when he does not have weight into feet on floor. Any lift of feet and pt loses balance posteriorly and to R. Success noted with bringing L elbow to L knee and forward lean performed with supervision. Return to midline following performance. Returned to this positioning with all noted R and/ or posterior leans. .Sit<.stands performed with improved balance attainment following successful forward leans in sitting. With good balance, pt requires CGA with intermittent MinA with faitgue to maintain upright balance. Pt able to perform minisquats 2x10, lateral balance shifts, and UE PNF patterns and maintain balance throughout. Some difficulty noted with maintaining balance during PNF patterns d/t pt fatigue and slight shifting in weight distribution. NMR performed for improvements in motor control and coordination, balance, sequencing, judgement, and self confidence/ efficacy in performing all aspects of mobility at highest level of independence.   Patient supine at end of session with brakes locked, bed alarm set, and all needs within reach. Four rails raised per wife request.  Therapy Documentation Precautions:  Precautions Precautions: Fall Restrictions Weight Bearing Restrictions: No General:   Vital Signs: Therapy Vitals Temp: 98.1  F (36.7 C) Temp Source: Oral Pulse Rate: 71 Resp: 16 BP: 126/68 Patient Position (if appropriate): Lying Oxygen Therapy SpO2: 96 % O2 Device:  Room Air Pain: Pain Assessment Pain Scale: 0-10 Pain Score: 4  Pain Type: Acute pain Pain Location: Head Pain Descriptors / Indicators: Aching;Sore Pain Frequency: Constant Pain Onset: Gradual Patients Stated Pain Goal: 4 Pain Intervention(s): Medication (See eMAR) Agitated Behavior Scale: TBI Observation Details Observation Environment: pt's room Start of observation period - Date: 01/16/22 Start of observation period - Time: 1300 End of observation period - Date: 01/16/22 End of observation period - Time: 1345 Agitated Behavior Scale (DO NOT LEAVE BLANKS) Short attention span, easy distractibility, inability to concentrate: Present to a slight degree Impulsive, impatient, low tolerance for pain or frustration: Absent Uncooperative, resistant to care, demanding: Absent Violent and/or threatening violence toward people or property: Absent Explosive and/or unpredictable anger: Absent Rocking, rubbing, moaning, or other self-stimulating behavior: Absent Pulling at tubes, restraints, etc.: Absent Wandering from treatment areas: Absent Restlessness, pacing, excessive movement: Absent Repetitive behaviors, motor, and/or verbal: Absent Rapid, loud, or excessive talking: Absent Sudden changes of mood: Absent Easily initiated or excessive crying and/or laughter: Absent Self-abusiveness, physical and/or verbal: Absent Agitated behavior scale total score: 15   Therapy/Group: Individual Therapy  Alger Simons PT, DPT, CSRS 01/16/2022, 10:33 AM

## 2022-01-16 NOTE — Patient Care Conference (Signed)
Inpatient RehabilitationTeam Conference and Plan of Care Update Date: 01/16/2022   Time: 10:32 AM    Patient Name: Brandon [name]      Medical Record Number: [id number]  Date of Birth: 03-27-1946 Sex: Male         Room/Bed: 4W08C/4W08C-01 Payor Info: Payor: [employer] MEDICARE / Plan: [place] HMO / Product Type: *No Product type* /    Admit Date/Time:  12/30/2021  1:11 PM  Primary Diagnosis:  Traumatic brain injury with loss of consciousness [facility])  Hospital Problems: Principal Problem:   Traumatic brain injury with loss of consciousness [facility])    Expected Discharge Date: Expected Discharge Date: 01/23/22  Team Members Present: Physician leading conference: Other (comment) (Dr Durel Salts, DO) Social Worker Present: Loralee Pacas, Rudd Nurse Present: Other (comment) Tacy Learn, RN) PT Present: Tereasa Coop, PT OT Present: Cherylynn Ridges, OT SLP Present: Weston Anna, SLP PPS Coordinator present : Gunnar Fusi, SLP     Current Status/Progress Goal Weekly Team Focus  Bowel/Bladder   Incontinent with some continent episodes.  Continue to increase continent episodes.  Offer to toiletin Q3 hrs with awake, q4 at hs.   Swallow/Nutrition/ Hydration             ADL's   Min A sit<>stands, min/mod A transfers, mod A LB ADLs, MIn A UB ADLs  Supervision/min A  pt/family educatio, trasnfers, attention, initiation, cognitive retraining, self-care retraining   Mobility   minA/modA bed mobility, CGA to minA sit to stand, gait up to 100' with RW and CGA/minA  Supervision to CGA for gait  Family ed, ambulation, NMR, balance, DC prep   Communication   Min-Mod A  Supervision  use of word-finding strategies, verbal expression of wants/needs   Safety/Cognition/ Behavioral Observations  [place] level VI-Mod A for basic tasks  Min A  attention, problem solving, recall, error awareness   Pain   Pain to Bilateral knees, Controlled with scheduled voltaren gel.  <3/10  Assess Qshift  and prn   Skin   Incisions to head healing, skin tear to Lt knee.  Promote healing and prevention of infection  Assess Qshift and prn     Discharge Planning:  D/c to home with support from wife, and their son who lives in the home. SW will confirm no barriers to discharge.   Team Discussion: TBI. Incontinent x 2. LBM 09/06. Occasional back pain. Steroid injection to right knee with improvement. Trazodone for sleep. Incision to head healing. Ritalin for improved attention.  Patient on target to meet rehab goals: yes, ambulating up to 165f with rolling walker  *See Care Plan and progress notes for long and short-term goals.   Revisions to Treatment Plan:  Monitor labs, medication adjustments  Teaching Needs: Medications, safety, skin/wound care, gait/transfer training, etc.  Current Barriers to Discharge: Home enviroment access/layout, Incontinence, and Wound care  Possible Resolutions to Barriers: Family education, medication education, skin/wound education, fall prevention education, order recommended DME     Medical Summary Current Status: medically stable  Barriers to Discharge: Decreased family/caregiver support;Home enviroment access/layout;Incontinence;Medical stability;Wound care  Barriers to Discharge Comments: Intermittent confusion, joint pain Possible Resolutions to Barriers/Weekly Focus: Continue encouraging sleep-wake cycle, daytime orientation, and imrpovement in R knee pain as steroid injection kicks in   Continued Need for Acute Rehabilitation Level of Care: The patient requires daily medical management by a physician with specialized training in physical medicine and rehabilitation for the following reasons: Direction of a multidisciplinary physical rehabilitation program to maximize functional independence :  Yes Medical management of patient stability for increased activity during participation in an intensive rehabilitation regime.: Yes   I attest that I was  present, lead the team conference, and concur with the assessment and plan of the team.   Ernest Pine 01/16/2022, 2:31 PM

## 2022-01-16 NOTE — Progress Notes (Signed)
Occupational Therapy Weekly Progress Note  Patient Details  Name: AMORE GRATER MRN: [id number] Date of Birth: Sep 25, 1945  Beginning of progress report period: December 31, 2021 End of progress report period: January 16, 2022  Today's Date: 01/16/2022 OT Individual Time: 1420-1503 OT Individual Time Calculation (min): 43 min    Patient has met 3 of 3 short term goals.  Patient is making steady progress towards OT goals. Stand-pivot transfers have improved to min/mod A of 1 person. He can stand with min A with the use of grab bars, but is still occasionally leaning to the R pending fatigue. Pt is initiating functional BADL tasks with min cues.  Patient continues to demonstrate the following deficits: muscle weakness, decreased cardiorespiratoy endurance, impaired timing and sequencing, unbalanced muscle activation, motor apraxia, decreased coordination, and decreased motor planning, decreased initiation, decreased attention, decreased awareness, decreased problem solving, decreased safety awareness, decreased memory, and delayed processing, and decreased sitting balance, decreased standing balance, decreased postural control, and decreased balance strategies and therefore will continue to benefit from skilled OT intervention to enhance overall performance with BADL and Reduce care partner burden.  Patient progressing toward long term goals..  Continue plan of care.  OT Short Term Goals Week 2:  OT Short Term Goal 1 (Week 2): Pt will increase toilet transfer to Mod A x1 using RW OT Short Term Goal 1 - Progress (Week 2): Met OT Short Term Goal 2 (Week 2): Pt will complete sit to stand with RW with Mod Assist x1. OT Short Term Goal 2 - Progress (Week 2): Met OT Short Term Goal 3 (Week 2): Marland KitchenPt will demonstrate improved sitting balance and activity tolerance while completing self care tasks seated on EOB with Min A to maintain balance OT Short Term Goal 3 - Progress (Week 2): Met Week 3:  OT Short  Term Goal 1 (Week 3): LTG=STG 2/2 ELOS  Skilled Therapeutic Interventions/Progress Updates:    Pt greeted semi-reclined in bed with spouse present. Pt agreeable to therapy. Pt completed bed mobility and initiated sitting up with min A, but had difficulty achieving sitting balance due to R and posterior lean. OT had pt lean L elbow onto pillow with improved balance. Pt donned shoes today with increased time and supervision when leaning forward. Pt then completed stand-pivot to TIS wc with increased time and mod A due to lean to the R. Pt brought to therapy gym and worked on standing balance and weight shifting to the L using mirror feedback and reaching activity on L. Pt then ambulated 5 feet in hallway with mod A and strong R lean requiring wc follow and max A to return safely to wc. Pt took rest break, then ambulated 84 feet w/ RW and much improved upright posture and cues for step length. Pt took one more rest break, then ambulated 5 more feet back to bed with RW and min A. PT left semi-reclined in bed with bed alarm on, spouse present and needs met.   Therapy Documentation Precautions:  Precautions Precautions: Fall Restrictions Weight Bearing Restrictions: No Pain:  Pt reports knee pain at times, rest and repositioned  Therapy/Group: Individual Therapy  Valma Cava 01/16/2022, 3:16 PM

## 2022-01-16 NOTE — Progress Notes (Signed)
PROGRESS NOTE   Subjective/Complaints:  Doing well this AM, seen at bedside with wife. States no further nighttime confusion, and had continent BM and urination this AM. HA still intermittent but responsive to tylenol. SLP session went well today. Otherwise, no complaints.  Per teams, much improved and working well toward DC goals.     ROS:    Denies fevers, chills, N/V, abdominal pain, constipation, diarrhea, SOB, cough, chest pain, new weakness or paraesthesias.     Objective:  Recent Labs    01/16/22 0614  WBC 4.4  HGB 12.1*  HCT 36.2*  PLT 212    Recent Labs    01/16/22 0614  NA 138  K 3.9  CL 104  CO2 29  GLUCOSE 137*  BUN 13  CREATININE 0.92  CALCIUM 8.8*      Intake/Output Summary (Last 24 hours) at 01/16/2022 1029 Last data filed at 01/16/2022 0800 Gross per 24 hour  Intake 240 ml  Output 100 ml  Net 140 ml        Physical Exam: Vital Signs Blood pressure 126/68, pulse 71, temperature 98.1 F (36.7 C), temperature source Oral, resp. rate 16, height 6' 6.5" (1.994 m), weight 102.2 kg, SpO2 96 %.    General: awake, alert, appropriate,NAD HENT: conjugate gaze; oropharynx moist CV: regular rate; no JVD Pulmonary: CTA B/L; no W/R/R- good air movement GI: soft, NT, ND, (+)BS Psych: Pt's affect is appropriate. Pt is cooperative. Skin: crani incisions cdi, s/p staple removal. + mepilex dressing L knee.  Neuro: CN 2-12 grossly intact. Follows all commands. AAOx4.   Musculoskeletal: Moves all 4 extremities against resistance.    Assessment/Plan: 1. Functional deficits which require 3+ hours per day of interdisciplinary therapy in a comprehensive inpatient rehab setting. Physiatrist is providing close team supervision and 24 hour management of active medical problems listed below. Physiatrist and rehab team continue to assess barriers to discharge/monitor patient progress toward functional and  medical goals  Care Tool:  Bathing    Body parts bathed by patient: Face, Front perineal area   Body parts bathed by helper: Right arm, Left arm, Chest, Abdomen, Buttocks, Right upper leg, Left upper leg, Right lower leg, Left lower leg     Bathing assist Assist Level: Maximal Assistance - Patient 24 - 49%     Upper Body Dressing/Undressing Upper body dressing   What is the patient wearing?: Pull over shirt    Upper body assist Assist Level: Maximal Assistance - Patient 25 - 49%    Lower Body Dressing/Undressing Lower body dressing      What is the patient wearing?: Pants, Incontinence brief     Lower body assist Assist for lower body dressing: 2 Helpers     Toileting Toileting    Toileting assist Assist for toileting: Total Assistance - Patient < 25%     Transfers Chair/bed transfer  Transfers assist  Chair/bed transfer activity did not occur: Safety/medical concerns  Chair/bed transfer assist level: Contact Guard/Touching assist     Locomotion Ambulation   Ambulation assist      Assist level: Minimal Assistance - Patient > 75% Assistive device: Walker-rolling Max distance: 84 ft   Walk 10  feet activity   Assist     Assist level: Minimal Assistance - Patient > 75% Assistive device: Walker-rolling   Walk 50 feet activity   Assist Walk 50 feet with 2 turns activity did not occur: Safety/medical concerns  Assist level: Minimal Assistance - Patient > 75% Assistive device: Walker-rolling    Walk 150 feet activity   Assist Walk 150 feet activity did not occur: Safety/medical concerns         Walk 10 feet on uneven surface  activity   Assist Walk 10 feet on uneven surfaces activity did not occur: Safety/medical concerns         Wheelchair     Assist Is the patient using a wheelchair?: Yes Type of Wheelchair: Manual Wheelchair activity did not occur: Safety/medical concerns  Wheelchair assist level: Minimal Assistance -  Patient > 75% Max wheelchair distance: 150 ft    Wheelchair 50 feet with 2 turns activity    Assist    Wheelchair 50 feet with 2 turns activity did not occur: Safety/medical concerns   Assist Level: Supervision/Verbal cueing   Wheelchair 150 feet activity     Assist  Wheelchair 150 feet activity did not occur: Safety/medical concerns   Assist Level: Minimal Assistance - Patient > 75%   Blood pressure 126/68, pulse 71, temperature 98.1 F (36.7 C), temperature source Oral, resp. rate 16, height 6' 6.5" (1.994 m), weight 102.2 kg, SpO2 96 %.  Medical Problem List and Plan: 1. Functional deficits secondary to bilateral SDH's after fall s/p bilateral burr hole evacuation 8/14             -patient may shower             -ELOS/Goals: 12-14 days, supervision to mod I goals with PT, OT, SLP  Continue CIR- PT, OT and SLP   2.  Antithrombotics: -DVT/anticoagulation:  Mechanical:  Antiembolism stockings, knee (TED hose) Bilateral lower extremities Sequential compression devices, below knee Bilateral lower extremities             - CT head 8/24 stable -> Lovenox 40 IM mg daily for DVT prophy 8/25             -antiplatelet therapy: n/a  3. Pain Management/headache: ongoing             -Tylenol 1000 mg TID scheduled and DC hydrocodone 8/22 -> Fioricet 1 tab TID + Tylenol 500 mg BID PRN for HA, pain 8/25 -> Fioricet/tylenol PRN 8/28; restart scheduled as not getting PRNs despite ongoing HA 8/29            - Gabapentin 100 mg BID for persistent HA started 8/28 -> DC 8/29 d/t worsening cognition  9/2- denies pain today injection likely was extremely helpful 4. Mood/Behavior/Sleep - improved:              -pt seems fairly up beat and wife is very supportive             -antipsychotic agents: n/a             - 8/26 Altered sleep wake cycle- trazodone increase trazodone to '75mg'$  for sleep-> 100 mg 8/29                           - Retimed to 1900 9/1  - improved sleep and nighttime  attention per family                 5.  Neuropsych/cognition: This patient is not quite capable of making decisions on his own behalf.            - Given persistent slow processing and attention despite delirum workup, in setting of TBI, will trial Ritalin 5 mg BID 8/29 to improve arousal and participation -> reduce to 5 mg daily d/t nighttime hyperarrousal 8/31- improved  6. Skin/Wound Care: local care to scalp, removed staples 8/25    7. Fluids/Electrolytes/Nutrition: encourage appropriate po intake             -liberalized him to regular diet to help with po intake. Encouraged wife to bring food from home as well. Poor po intake so far however.              -check labs Monday - stable  8. Seizure prophylaxis: keppra '500mg'$  q 12 hours -> DC 8/23 9. Hx of Prostate Cancer, c/b urinary incontinence - ongoing             -flomax 0.'4mg'$  bid, proscar '5mg'$  daily              -emptying with intermittent incontinence thus far              - UA 8/23 without s/s infection             - Discussed incontinence of bowel and bladder with family in detail 8/25; they feel he is now close to his baseline, which is intermittently incontinent of both                  10. HTN: well controlled             -hctz/lisinopril daily per home regimen; monitor for orthostasis  11. 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 Delirium, likely  multifactorial etiology (post-hospitalization, polypharmacy, TBI) - resolved - Ensure wearing hearing aides; wife has microphone available to amplify  - UA 8/23 stable; CBC 8/21-22 stable, no leukocytosis - Delirium precautions added and discussed with family - Keppra may be contributing, DC 8/23  - repeat CT head  - ordered 8/24 - stable - initiate sleep log 8/24 - reinforce 8/29 - DC PRN compazine and zofran, not using and has cog s/e   13.  Constipation/diarrhea- LBM 9/4, continent, improved - PRN suppository 8/27, with results - Daily Miralax + 1x dose 8/29 -> PRN 9/4 - Senn[place]-S 1 TAB QHS 8/28 -> BID 8/29 -> QHS 9/4 - PRN suppository 8/29 - +results  Hypokalemia:   -K+ 3.3 , KCL supplement -> 3.7 8/28-> 3.3 9/1 on  40 meq daily - repeat 9/5 3.9; continue on 20 MEQ daily and recheck 9/11  LOS: 17 days A FACE TO FACE EVALUATION WAS PERFORMED  Est Dispo: Tuesday, Sept 12th to home with family assistance   Gertie Gowda, DO 01/16/2022

## 2022-01-16 NOTE — Progress Notes (Signed)
Speech Language Pathology TBI Note  Patient Details  Name: Brandon [name] MRN: [id number] Date of Birth: 1946-03-26  Today's Date: 01/16/2022 SLP Individual Time: 0817-0900 SLP Individual Time Calculation (min): 43 min  Short Term Goals: Week 3: SLP Short Term Goal 1 (Week 3): Pt will sustain his attention to basic, familiar tasks for 10-12 minute intervals wtih mod cues for redirection. SLP Short Term Goal 2 (Week 3): Pt will utilize external aids to recall orientation information with min verbal and visual cues. SLP Short Term Goal 3 (Week 3): Patient will demonstrate functional problem solving with basic and familiar tasks with Mod verbal cues. SLP Short Term Goal 4 (Week 3): Patient will verbally express his basic wants/needs at the phrase level with Mod A multimodal cues. SLP Short Term Goal 5 (Week 3): Patient will self-monitor and correct errors at the word level during a structured language task with Min A multimodal cues.  Skilled Therapeutic Interventions: Skilled treatment session focused on cognitive-linguistic goals. Upon arrival, patient was awake in bed and agreeable to treatment session. SLP facilitated session by providing Min-Mod A verbal cues for functional problem solving during a basic medication management task. Patient then recalled his previous medications with 75% accuracy and recalled the functions of his current medications with 50% accuracy. Recommend organizing a pill box during next session. Patient was independently oriented to place and situation and required Min verbal cues for use of the calendar for recall of date. Patient without word-finding deficits during session. Patient left upright in bed with alarm on and all needs within reach. Continue with current plan of care.      Pain Pain Assessment Pain Scale: 0-10 Pain Score: 4  Pain Type: Acute pain Pain Location: Head Pain Descriptors / Indicators: Aching;Sore Pain Frequency: Constant Pain Onset:  Gradual Patients Stated Pain Goal: 4 Pain Intervention(s): Medication (See eMAR)  Agitated Behavior Scale: TBI Observation Details Observation Environment: CIR Start of observation period - Date: 01/16/22 Start of observation period - Time: 0817 End of observation period - Date: 01/16/22 End of observation period - Time: 0900 Agitated Behavior Scale (DO NOT LEAVE BLANKS) Short attention span, easy distractibility, inability to concentrate: Present to a slight degree Impulsive, impatient, low tolerance for pain or frustration: Absent Uncooperative, resistant to care, demanding: Absent Violent and/or threatening violence toward people or property: Absent Explosive and/or unpredictable anger: Absent Rocking, rubbing, moaning, or other self-stimulating behavior: Absent Pulling at tubes, restraints, etc.: Absent Wandering from treatment areas: Absent Restlessness, pacing, excessive movement: Absent Repetitive behaviors, motor, and/or verbal: Absent Rapid, loud, or excessive talking: Absent Sudden changes of mood: Absent Easily initiated or excessive crying and/or laughter: Absent Self-abusiveness, physical and/or verbal: Absent Agitated behavior scale total score: 15  Therapy/Group: Individual Therapy  Xavior Niazi 01/16/2022, 10:27 AM

## 2022-01-16 NOTE — Progress Notes (Signed)
Physical Therapy Session Note  Patient Details  Name: Brandon [name] MRN: [id number] Date of Birth: July 12, 1945  Today's Date: 01/16/2022 PT Individual Time: 1030-1127 PT Individual Time Calculation (min): 57 min   Short Term Goals: Week 2:  PT Short Term Goal 1 (Week 2): Pt will complete bed mobility consistently with minA. PT Short Term Goal 1 - Progress (Week 2): Met PT Short Term Goal 2 (Week 2): Pt will complete sit to stand transfer consistently with minA. PT Short Term Goal 2 - Progress (Week 2): Met PT Short Term Goal 3 (Week 2): Pt will perform bed to chair transfer consistently with minA. PT Short Term Goal 3 - Progress (Week 2): Progressing toward goal PT Short Term Goal 4 (Week 2): Pt will ambulate x50' with modA +2 and LRAD. PT Short Term Goal 4 - Progress (Week 2): Met Week 3:  PT Short Term Goal 1 (Week 3): STGs = LTGs  Skilled Therapeutic Interventions/Progress Updates:     Pt presents supine in bed with wife at bedside. Pt agreeable to therapy without reports of pain. Supine<>Sitting EOB with minA for trunk support. Once sitting EOB, posterior bias present with delayed righting responses for posterior LOB. Required minA for unsupported sitting balance and modA for unsupported sitting balance while he donned shoes via figure-4 technique. Sit<>Stand to RW with minA from low EOB height - ambulatory transfer with minA and RW to TIS w/c with poor safety awareness for approach and sitting prematurely. Pt propelled himself in TIS w/c using feet only to propel, ~151f - speed slowed with decreased stride length during propulsion.   Instructed in gait training ~1066fwith minA and RW - cues for increasing stride length and terminal knee extension in stance, as well as maintaining forward gaze and he tends to look at his feet as he walks.   Setup with Maxi Sky using the harness in rehab gym to work on static and dynamic standing balance. Pt requires minA for static standing with RW  support and heavy modA without UE support due to posterior lean. Pt unable to correct standing posture with verbal or visual cues while unsupported but with RW support, deficits lessen. Attempted to work on midline reaching with unilateral support but patient fearful of falling and frequently losing his balance posteriorly. Gait training in [place] - forward/backward walking ~2079fMinA for ambulating forwards and heavy modA for ambulating backwards - pt quite apraxic and has difficulty sequencing backwards walking.   Returned to his room in TIS w/c - remained in TISVickeryeclined for safety. Safety belt alarm on, call bell in reach, all needs met.    Therapy Documentation Precautions:  Precautions Precautions: Fall Restrictions Weight Bearing Restrictions: No General:     Therapy/Group: Individual Therapy  [name] P [name] 01/16/2022, 11:24 AM

## 2022-01-17 NOTE — Progress Notes (Signed)
Physical Therapy Session Note  Patient Details  Name: Brandon [name] MRN: [id number] Date of Birth: April 21, 1946  Today's Date: 01/17/2022 PT Individual Time: 1403-1501 and 1655-1736 PT Individual Time Calculation (min): 58 min and 41 min  Short Term Goals: Week 3:  PT Short Term Goal 1 (Week 3): STGs = LTGs  Skilled Therapeutic Interventions/Progress Updates:    Session 1: Pt received supine in bed with his son, Brandon [name], present and pt appearing agreeable to therapy session. Utilized pt's hearing aide microphone amplifier throughout session.   Pt's son reports pt had to urgently use bathroom and was unable to make it in time and is currently lying in bed changing LB dressing with his son's assistance - pt's son requests to be trained on how to assist pt with toileting and be cleared to do that while pt is in CIR, this therapist notified primary OT of this request to address tomorrow.  Supine in bed pt donned boxers with son providing total assist. Handed pt his shorts and he initiated supine>sitting L EOB, HOB partially elevated and relying on bedrails, with min assist. Once sitting EOB, pt repeatedly had posterior lean/LOB requiring min/mod assist for sitting balance and repeated cuing to participate in tasks that require anterior trunk flexion and reaching to correct posterior lean - threaded on shorts with mod assist and donned shoes with total assist. Pt continues to demo decreased awarness often fastening his shorts while sitting EOB prior to pulling them up over his hips.  Sit>stand partially elevated EOB>RW with heavy min assist of 1 due to excessive R posterior trunk extension causing minor posterior lean.  R stand pivot EOB>TIS w/c using RW with min assist - requires cuing to keep hands on RW while turning rather than reaching for w/c armrest and min cuing needed to turn fully prior to initiating sit.   Transported to/from gym in w/c for time management and energy conservation. Pt  continues to require TIS w/c in order to maintain safety while sitting due to pt frequently sliding hips forward in seat.  Sit<>stands TIS w/c<>RW with CGA during session - without cuing pt correctly pushes up with R hand from w/c armrest & places L hand on RW.  Gait training 68f x2 + 924f+ 737f2 using RW with light min/CGA assist for steadying and pt demonstrating the following gait deviations with therapist providing the described cuing and facilitation for improvement:  - improved B LE step lengths and increased gait speed; however, with fatigue pt continues to have decreased R LE foot clearance and step length - improved upright trunk posture  - improved step width  Did not have +2 w/c follow during gait but did have +2 around/available if needed for safety - had pt turn and sit on EOM or in TIS w/c at end of each gait trial to practice turning fully prior to initiating sitting.   Stair navigation training ascending/descending 4 steps x2 (seated break between) (6" height) using B HRs with heavy min assist primarily on descent (+2 close by for safety) - step-to pattern leading with R LE in both directions - during descent pt demonstrates more crouched posture with posterior lean at hips requiring manual facilitation to correct.  Dynamic standing balance task without UE support - using back of 1 LE against mat intermittently for balance while participating in 2 games of horseshoes - requires light min assist for balance - +2 assist having pt reach for the horseshoes to challenge balance.  Repeated sit<>stands to/from  partially elevated EOM with B UE pushing up progressed to B HHA to targeting B LE strengthening x10 reps + x8 reps - cuing not to push backs of legs into mat, pt continues to have minor posterior lean bias in standing - on 2nd set demonstrates increasing fatigue with worsening R lateral lean requiring heavy min assist to maintain upright.   Pt benefits from having seated rest  breaks without back support during session to address core strengthening and trunk control.   L stand pivot EOM>TIS w/c with min assist. Transported back to room and pt agreeable to remain sitting up in w/c - left with needs in reach, tilted back for safety, and seat belt alarm on.     Session 2: Pt received supine in bed with his son, Brandon [name], present and pt agreeable to therapy session. Pt's son requesting we assist pt to change into clean shorts and boxers. Therapist utilized pt's hearing aide microphone amplifier throughout session.   Supine>sitting L EOB, HOB partially elevated and using bedrail, with CGA for steadying. Once sitting EOB, pt demos improved sitting balance compared to earlier session with pt only requiring intermittent min assist to maintain trunk control due to posterior lean. Pt threaded on clean boxers and shorts with heavy min assist and verbal/visual cuing to orient clothing correctly- donned shoes total assist.  Sit>stand EOB>RW with min assist for balance - standing with heavy min assist for balance due to R posterior trunk rotation and lean while pt pulled shorts up over hips min assist.  Short distance R stand pivot transfer EOB>TIS w/c using RW with light min assist for balance and verbal cuing to turn fully prior to initiating sit - pt jokingly starts to sit but stands back up prior to turning fully as pt has a very joking personality.   Transported to/from gym in w/c for time management and energy conservation.  Simulated ambulatory car transfer using RW to small SUV height with light min assist for balance and cuing to turn around and sit down first prior to placing his LEs in vehicle - pt demos great understanding and motor planning to execute this.  Gait training ~26f up/down ramp using RW with min assist and +2 close by for safety - pt takes smaller more shuffled steps on ramp compared to on level ground.  Gait training 976fusing RW from ortho gym to ADL  apartment with light min assist/CGA for steadying/balance - continued cuing for increased R LE step length and foot clearance - noticed when turning towards L pt with minor R LOB due to poor R LE step length - overall pt tends to take very small steps when turning.   Gait training 6022fsing RW in ADL apartment including over carpet and floor transitions with light min assist for balance - pt demos a more shuffled gait pattern on the carpet with decreased foot clearance and step lengths bilaterally.  Gait training [id number] RW back towards room with +2 bringing w/c follow to allow increased distance with pt having 1x minor R LOB requiring heavier min assist to recover but otherwise CGA/light min assist - with fatigue will revert back to shorter more shuffled gait pattern but responds well to verbal cuing for larger steps and upright posture.  Transported remainder of distance back to room. Short distance ~6ft 69fulatory transfer w/c>EOB using RW with light min assist. Sit>supine close supervision due to poor trunk control leaning backwards. Pt left supported upright in bed with needs in reach, meal  tray set-up, bed alarm on, and his son present.     Therapy Documentation Precautions:  Precautions Precautions: Fall Restrictions Weight Bearing Restrictions: No   Pain: Session 1: No reports of pain throughout session.  Session 2: No reports of pain throughout session.    Therapy/Group: Individual Therapy  Tawana Scale , PT, DPT, NCS, CSRS 01/17/2022, 12:34 PM

## 2022-01-17 NOTE — Progress Notes (Signed)
Occupational Therapy Session Note  Patient Details  Name: Brandon [name] MRN: [id number] Date of Birth: Nov 09, 1945  Today's Date: 01/17/2022 OT Individual Time: 8563-1497 OT Individual Time Calculation (min): 63 min    Short Term Goals: Week 3:  OT Short Term Goal 1 (Week 3): LTG=STG 2/2 ELOS  Skilled Therapeutic Interventions/Progress Updates:  Pt awake up in w/c with son present in room upon OT arrival to the room. Pt reports, "I just get nervous," however pt unable to communicate reason for anxiousness. Pt in agreement for OT session.  Therapy Documentation Precautions:  Precautions Precautions: Fall Restrictions Weight Bearing Restrictions: No Vital Signs: Please see "Flowsheet" for most recent vitals charted by nursing staff.  Pain: Pain Assessment Pain Scale: 0-10 Pain Score: 0-No pain  ADL: Pt declines need to perform ADLs at this time. OT offered assistance with performing toileting prior to going to the gym, however, pt declines need at this time.   Functional Mobility: Pt participates in functional mobility in order to improve independence with functional transfers. Pt able to perform SPT's from the w/c <> edge of mat with CGA for safety with use of BUE on arm rest of w/c and at edge of mat. Pt then requested to perform ambulation. Pt able to perform functional ambulation for 60' x 3 with FWW and CGA - minimal assistance to ensure standing balance + w/c follow for safety. Pt requires minimal VC's to improve stride length instead of shuffling. Pt able to correct with VC's. On 3rd trial, pt attempts to sit prematurely with w/c follow. Education provided to pt on importance of communicating the need for a rest break to safely prepare and position w/c. Pt verbalizes understanding. Pt requires seated rest breaks in between trials due to fatigue.   BUE Strengthening & Motor Planning: Pt participates in BUE exercise in order to improve BUE strength and motor planning as well as  challenging sitting balance while seated at edge of mat. Pt able to perform 10 reps x 3 of the following exercises using a 3# dowel rode while seated at edge of mat: forward/reverse rows, chest press, and bicep curls. Pt initially requires minimal assistance to maintain sitting balance d/t significant R & posterior leaning while participating in BUE exercises. However, pt able to progress to performing with close supervision on 2nd & 3rd trials. Pt reports minimal fatigue after exercise and rest rest breaks after sets for muscle restoration for maximal strengthening benefits.   Pt requested to stay in the TIS w/c at end of session. Pt left sitting comfortably in the TIS w/c with personal belongings and call light within reach, belt alarm placed and activated, son present in room, and comfort needs attended to.    Therapy/Group: Individual Therapy  Barbee Shropshire 01/17/2022, 10:01 AM

## 2022-01-17 NOTE — Discharge Summary (Signed)
Physician Discharge Summary  Patient ID: Brandon [name] MRN: [id number] DOB/AGE: 78-Sep-1947 78 y.o.  Admit date: 12/30/2021 Discharge date: 01/23/2022  Discharge Diagnoses:  Principal Problem:   Traumatic brain injury with loss of consciousness (Brandon [name]) Bilateral SDH DVT prophylaxis Pain management Mood stabilization Seizure prophylaxis History of prostate cancer Hypertension Osteoarthritis bilateral knees Constipation  Discharged Condition: Stable  Significant Diagnostic Studies: DG Knee 1-2 Views Right  Result Date: 01/11/2022 CLINICAL DATA:  Chronic right knee pain EXAM: RIGHT KNEE - 2 VIEW COMPARISON:  None Available. FINDINGS: No evidence of fracture, dislocation, or joint effusion. Well corticated ossicle of the posterior knee, likely an accessory ossicle. Minimal tricompartmental degenerative changes consisting of osteophyte formation, joint spaces are relatively well-maintained. Chondrocalcinosis. Vascular calcifications. Soft tissues are unremarkable. IMPRESSION: 1. No acute osseous abnormality. 2. Chondrocalcinosis, findings can be seen in the setting of CPPD arthropathy. Electronically Signed   By: Yetta Glassman M.D.   On: 01/11/2022 09:49   CT HEAD WO CONTRAST (5MM)  Result Date: 01/04/2022 CLINICAL DATA:  Follow-up subdural hematoma.  Bilateral burr holes. EXAM: CT HEAD WITHOUT CONTRAST TECHNIQUE: Contiguous axial images were obtained from the base of the skull through the vertex without intravenous contrast. RADIATION DOSE REDUCTION: This exam was performed according to the departmental dose-optimization program which includes automated exposure control, adjustment of the mA and/or kV according to patient size and/or use of iterative reconstruction technique. COMPARISON:  CT head without contrast 12/28/2021, 12/26/2021, 12/25/2021 and 12/24/2021. FINDINGS: Brain: Bilateral extra-axial drains were removed. Minimal gas remains bilaterally. On coronal imaging, the right-sided  collection now measures 14 mm compared with 15 mm at the same level. The left-sided collection is slightly larger, measuring 7 mm compared with 4 mm. No residual midline shift is present. Residual hyperdense blood products are noted posteriorly, less hyperintense than on the prior exam. Some hyperdense blood is still seen on the tentorium bilaterally. No new or progressive hemorrhage is present. No significant intraventricular hemorrhage is present. No acute cortical infarcts are present. Basal ganglia are within normal limits. The brainstem and cerebellum are normal. Vascular: Atherosclerotic calcifications are present within the cavernous internal carotid arteries and distal vertebral arteries. No hyperdense vessel is present. Skull: Bilateral burr holes again noted. Skin staples are in place. No acute or focal osseous abnormality is present otherwise. Sinuses/Orbits: The paranasal sinuses and mastoid air cells are clear. Bilateral lens replacements are noted. Globes and orbits are otherwise unremarkable. IMPRESSION: 1. Interval removal of bilateral extra-axial drains. 2. Interval decrease in size of right extra-axial collection and resolution of midline shift. 3. The left-sided extra-axial fluid is more prominent than on the prior exam. This likely represents a shift of brain away from the skull rather than any positive pressure. 4. Residual hyperdense blood products are noted posteriorly on the right, less hyperintense than on the prior exam. This represents expected evolution of the existing hemorrhage. 5. No new or progressive hemorrhage. 6. Atherosclerosis. Electronically Signed   By: San Morelle M.D.   On: 01/04/2022 15:33   VAS [place] LOWER EXTREMITY VENOUS (DVT)  Result Date: 12/31/2021  Lower Venous DVT Study Patient Name:  Brandon [name]  Date of Exam:   12/31/2021 Medical Rec #: [id number]        Accession #:    [id number] Date of Birth: July 17, 1945        Patient Gender: M Patient Age:   34 years  Exam Location:  [facility] Procedure:      VAS [place] LOWER EXTREMITY  VENOUS (DVT) Referring Phys: Alger Simons --------------------------------------------------------------------------------  Indications: Swelling, and Rehabilitation.  Risk Factors: Immobility. Comparison Study: No prior study on file Performing Technologist: Sharion Dove RVS  Examination Guidelines: A complete evaluation includes B-mode imaging, spectral Doppler, color Doppler, and power Doppler as needed of all accessible portions of each vessel. Bilateral testing is considered an integral part of a complete examination. Limited examinations for reoccurring indications may be performed as noted. The reflux portion of the exam is performed with the patient in reverse Trendelenburg.  +---------+---------------+---------+-----------+----------+--------------+ RIGHT    CompressibilityPhasicitySpontaneityPropertiesThrombus Aging +---------+---------------+---------+-----------+----------+--------------+ CFV      Full           Yes      Yes                                 +---------+---------------+---------+-----------+----------+--------------+ SFJ      Full                                                        +---------+---------------+---------+-----------+----------+--------------+ FV Prox  Full                                                        +---------+---------------+---------+-----------+----------+--------------+ FV Mid   Full                                                        +---------+---------------+---------+-----------+----------+--------------+ FV DistalFull                                                        +---------+---------------+---------+-----------+----------+--------------+ PFV      Full                                                        +---------+---------------+---------+-----------+----------+--------------+ POP      Full           Yes       Yes                                 +---------+---------------+---------+-----------+----------+--------------+ PTV      Full                                                        +---------+---------------+---------+-----------+----------+--------------+ PERO     Full                                                        +---------+---------------+---------+-----------+----------+--------------+   +---------+---------------+---------+-----------+----------+--------------+  LEFT     CompressibilityPhasicitySpontaneityPropertiesThrombus Aging +---------+---------------+---------+-----------+----------+--------------+ CFV      Full           Yes      Yes                                 +---------+---------------+---------+-----------+----------+--------------+ SFJ      Full                                                        +---------+---------------+---------+-----------+----------+--------------+ FV Prox  Full                                                        +---------+---------------+---------+-----------+----------+--------------+ FV Mid   Full                                                        +---------+---------------+---------+-----------+----------+--------------+ FV DistalFull                                                        +---------+---------------+---------+-----------+----------+--------------+ PFV      Full                                                        +---------+---------------+---------+-----------+----------+--------------+ POP      Full           Yes      Yes                                 +---------+---------------+---------+-----------+----------+--------------+ PTV      Full                                                        +---------+---------------+---------+-----------+----------+--------------+ PERO     Full                                                         +---------+---------------+---------+-----------+----------+--------------+     Summary: BILATERAL: - No evidence of deep vein thrombosis seen in the lower extremities, bilaterally. - RIGHT: - A cystic structure is found in the popliteal fossa measuring 2.23 cm X 3.50 cm.  LEFT: - A cystic structure is found in the  popliteal fossa measuring 4 cm X 2.23 cm.  *See table(s) above for measurements and observations. Electronically signed by Jamelle Haring on 12/31/2021 at 2:59:05 PM.    Final    CT HEAD WO CONTRAST (5MM)  Result Date: 12/28/2021 CLINICAL DATA:  76 year old male postoperative day 3 status post bilateral frontal and parietal burr holes, evacuation of subdural hematoma with drains. EXAM: CT HEAD WITHOUT CONTRAST TECHNIQUE: Contiguous axial images were obtained from the base of the skull through the vertex without intravenous contrast. RADIATION DOSE REDUCTION: This exam was performed according to the departmental dose-optimization program which includes automated exposure control, adjustment of the mA and/or kV according to patient size and/or use of iterative reconstruction technique. COMPARISON:  12/26/2021 postoperative head CT and earlier. FINDINGS: Brain: Bilateral subdural drains remain in place. No significant change in size and configuration of the residual mixed density right side subdural hematoma, up to 15 mm in thickness. Smaller mixed density left side subdural hematoma is now 4-5 mm thickness at most levels and has mildly regressed (previously 8-9 mm). Posterior para falcine and bilateral tentorial subdural blood is slightly decreased (coronal image 54). Small volume subarachnoid hemorrhage in the supratentorial and infratentorial brain redemonstrated, including along the left sylvian fissure and in some cerebellar folia. Trace IVH redemonstrated. Stable ventricle size and configuration. No ventriculomegaly. Mildly increased leftward midline shift, now 4-5 mm (previously 3 mm). Basilar  cisterns remain patent. No cortically based acute infarct identified. Vascular: Calcified atherosclerosis at the skull base. Skull: Stable visualized osseous structures. Sinuses/Orbits: Visualized paranasal sinuses and mastoids are stable and well aerated. Other: Stable scalp postoperative changes. Skin staples remain in place. Orbits appear stable and negative. IMPRESSION: 1. Bilateral subdural drains remain in place. Unchanged mixed density 15 mm Right Side Subdural Hematoma. Additional regression of the smaller Left Subdural, now 4-5 mm. Small volume para falcine and tentorial SDH. Stable small volume SAH and trace IVH. 2. Mildly increased leftward midline shift, now 4-5 mm. Basilar cisterns remain patent. No ventriculomegaly. 3. No new intracranial abnormality. Electronically Signed   By: Genevie Ann M.D.   On: 12/28/2021 06:07   CT HEAD WO CONTRAST  Result Date: 12/26/2021 CLINICAL DATA:  76 year old male with bilateral subdural hematoma. Postoperative day 1 status post bilateral frontal and parietal burr holes, evacuation of subdural hematoma with drains. EXAM: CT HEAD WITHOUT CONTRAST TECHNIQUE: Contiguous axial images were obtained from the base of the skull through the vertex without intravenous contrast. RADIATION DOSE REDUCTION: This exam was performed according to the departmental dose-optimization program which includes automated exposure control, adjustment of the mA and/or kV according to patient size and/or use of iterative reconstruction technique. COMPARISON:  Head CT 12/25/2021 and earlier. FINDINGS: Brain: Bilateral subdural drains are in place. Small volume anterior frontal convexity bilateral postoperative pneumocephalus. Residual mixed density right side subdural hematoma now is up to 15 mm in thickness (previously 28 mm). Residual mixed density left side subdural hematoma is up to 8-9 mm in thickness. And there is largely new posterior para falcine subdural blood up to 10 mm in thickness, a  smaller volume layering along the tentorium (coronal image 49). Normalized basilar cistern patency. Improved lateral ventricle patency. And only mild leftward midline shift now at 3 mm (previously 6 mm). There is a small volume of posterior fossa subarachnoid hemorrhage (series 5, image 57 right cerebellar folia). And a small volume of left superior frontal gyrus SAH also. Trace intraventricular hemorrhage (left occipital horn). No ventriculomegaly. No cortically based acute infarct identified.  Vascular: Calcified atherosclerosis at the skull base. Skull: New bilateral burr holes. Sinuses/Orbits: Visualized paranasal sinuses and mastoids are stable and well aerated. Other: Postoperative changes to the bilateral scalp. Small volume scalp hematoma and gas. Percutaneous drains bilaterally. Orbits soft tissues appear negative. IMPRESSION: 1. Postoperative day 1 bilateral subdural evacuation with drain placement. Decreased bilateral subdural hematomas, residual up to 15 mm in on the right and 8-9 mm on the left. Probably re-distributed para falcine and tentorial SDH up to 10 mm. 2. New small volume subarachnoid hemorrhage in the posterior fossa and left superior frontal gyrus. Trace IVH. 3. Decreased leftward midline shift, now 3 mm. Normalized basilar cisterns. Electronically Signed   By: Genevie Ann M.D.   On: 12/26/2021 04:01   CT HEAD WO CONTRAST (5MM)  Result Date: 12/25/2021 CLINICAL DATA:  Subdural hematoma, increasing confusion EXAM: CT HEAD WITHOUT CONTRAST TECHNIQUE: Contiguous axial images were obtained from the base of the skull through the vertex without intravenous contrast. RADIATION DOSE REDUCTION: This exam was performed according to the departmental dose-optimization program which includes automated exposure control, adjustment of the mA and/or kV according to patient size and/or use of iterative reconstruction technique. COMPARISON:  12/24/2021 FINDINGS: Brain: Bilateral acute on chronic subdural  hematomas are again identified, minimally increased in size since prior study. The right-sided collection measures 2.8 cm in maximal dimension and left-sided collection measures 1.5 cm in maximal dimension, previously measuring 2.5 and 1.5 cm respectively. No new areas of acute hemorrhage. Stable areas of acute subdural hematoma extending along the falx. There has been interval hematocrit effect with layering of blood products within the bilateral subdural collections. There is persistent leftward midline shift measuring approximately 6 mm at the level of the septum pellucidum, previously measuring 5 mm. Stable effacement of the lateral ventricles, right greater than left. No evidence of acute infarct.  Midline structures are stable. Vascular: Stable atherosclerosis. Skull: Stable left occipital scalp laceration with skin staples in place. Negative for fracture or focal lesion. Sinuses/Orbits: No acute finding. Other: None. IMPRESSION: 1. Bilateral acute on chronic subdural hematomas as above, with minimal increase in size since prior study. Stable mass effect and leftward midline shift as described above. 2. Left occipital scalp laceration with skin staples in place. Electronically Signed   By: Randa Ngo M.D.   On: 12/25/2021 11:45   CT Head Wo Contrast  Result Date: 12/24/2021 CLINICAL DATA:  Initial evaluation for acute trauma. EXAM: CT HEAD WITHOUT CONTRAST CT CERVICAL SPINE WITHOUT CONTRAST TECHNIQUE: Multidetector CT imaging of the head and cervical spine was performed following the standard protocol without intravenous contrast. Multiplanar CT image reconstructions of the cervical spine were also generated. RADIATION DOSE REDUCTION: This exam was performed according to the departmental dose-optimization program which includes automated exposure control, adjustment of the mA and/or kV according to patient size and/or use of iterative reconstruction technique. COMPARISON:  None Available. FINDINGS: CT  HEAD FINDINGS Brain: Large acute on subacute to chronic bilateral subdural hematomas are seen. Collection on the right measures up to 2.5 cm in diameter, with the collection on the left measuring up to 1.5 cm in diameter. Associated mass effect with 5 mm of right-to-left shift. Acute subdural blood seen extending along the falx and tentorium. Probable small associated subarachnoid component at the mid left frontal region (series 3, image 27). No other parenchymal or intra ventricular hemorrhage. No hydrocephalus or trapping. Basilar cisterns remain patent. No visible acute large vessel territory infarct or mass. Underlying mild chronic microvascular ischemic  disease. Vascular: No hyperdense vessel. Calcified atherosclerosis present at the skull base. Skull: Soft tissue contusion with laceration at the left occipital scalp. No calvarial fracture. Sinuses/Orbits: Globes orbital soft tissues within normal limits. Paranasal sinuses and mastoid air cells are clear. Other: None. CT CERVICAL SPINE FINDINGS Alignment: Straightening of the normal cervical lordosis. No listhesis or malalignment. Skull base and vertebrae: Skull base intact. Normal C1-2 articulations are preserved in the dens is intact. Vertebral body heights maintained. No acute fracture. Soft tissues and spinal canal: Soft tissues of the neck demonstrate no acute finding. No abnormal prevertebral edema. Moderate atheromatous change about the carotid bifurcations. Spinal canal demonstrates no visible acute finding. Disc levels: Moderate degenerative spondylosis seen C3-4 through C7-T1. Upper chest: Small focus of ground-glass opacity at the mid right lung apex, nonspecific, but possibly a small focus of mild pneumonitis (series 4, image 105). Underlying mild emphysema. Visualized upper chest demonstrates no other acute finding. Other: None. IMPRESSION: CT BRAIN: 1. Large acute on subacute to chronic bilateral subdural hematomas, measuring up to 2.5 cm on the  right and 1.5 cm on the left. Extension along the falx and tentorium. Associated mass effect with up to 5 mm of right-to-left shift. 2. Soft tissue contusion with laceration at the left occipital scalp. No calvarial fracture. 3. Underlying mild chronic microvascular ischemic disease. CT CERVICAL SPINE: 1. No acute traumatic injury within the cervical spine. 2. Moderate degenerative spondylosis C3-4 through C7-T1. 3. Small focus of ground-glass opacity at the mid right lung apex, nonspecific, but possibly mild pneumonitis. Critical Value/emergent results were called by telephone at the time of interpretation on 12/24/2021 at 10:00 pm to provider [facility] , who verbally acknowledged these results. Electronically Signed   By: Jeannine Boga M.D.   On: 12/24/2021 22:05   CT Cervical Spine Wo Contrast  Result Date: 12/24/2021 CLINICAL DATA:  Initial evaluation for acute trauma. EXAM: CT HEAD WITHOUT CONTRAST CT CERVICAL SPINE WITHOUT CONTRAST TECHNIQUE: Multidetector CT imaging of the head and cervical spine was performed following the standard protocol without intravenous contrast. Multiplanar CT image reconstructions of the cervical spine were also generated. RADIATION DOSE REDUCTION: This exam was performed according to the departmental dose-optimization program which includes automated exposure control, adjustment of the mA and/or kV according to patient size and/or use of iterative reconstruction technique. COMPARISON:  None Available. FINDINGS: CT HEAD FINDINGS Brain: Large acute on subacute to chronic bilateral subdural hematomas are seen. Collection on the right measures up to 2.5 cm in diameter, with the collection on the left measuring up to 1.5 cm in diameter. Associated mass effect with 5 mm of right-to-left shift. Acute subdural blood seen extending along the falx and tentorium. Probable small associated subarachnoid component at the mid left frontal region (series 3, image 27). No other  parenchymal or intra ventricular hemorrhage. No hydrocephalus or trapping. Basilar cisterns remain patent. No visible acute large vessel territory infarct or mass. Underlying mild chronic microvascular ischemic disease. Vascular: No hyperdense vessel. Calcified atherosclerosis present at the skull base. Skull: Soft tissue contusion with laceration at the left occipital scalp. No calvarial fracture. Sinuses/Orbits: Globes orbital soft tissues within normal limits. Paranasal sinuses and mastoid air cells are clear. Other: None. CT CERVICAL SPINE FINDINGS Alignment: Straightening of the normal cervical lordosis. No listhesis or malalignment. Skull base and vertebrae: Skull base intact. Normal C1-2 articulations are preserved in the dens is intact. Vertebral body heights maintained. No acute fracture. Soft tissues and spinal canal: Soft tissues of the neck  demonstrate no acute finding. No abnormal prevertebral edema. Moderate atheromatous change about the carotid bifurcations. Spinal canal demonstrates no visible acute finding. Disc levels: Moderate degenerative spondylosis seen C3-4 through C7-T1. Upper chest: Small focus of ground-glass opacity at the mid right lung apex, nonspecific, but possibly a small focus of mild pneumonitis (series 4, image 105). Underlying mild emphysema. Visualized upper chest demonstrates no other acute finding. Other: None. IMPRESSION: CT BRAIN: 1. Large acute on subacute to chronic bilateral subdural hematomas, measuring up to 2.5 cm on the right and 1.5 cm on the left. Extension along the falx and tentorium. Associated mass effect with up to 5 mm of right-to-left shift. 2. Soft tissue contusion with laceration at the left occipital scalp. No calvarial fracture. 3. Underlying mild chronic microvascular ischemic disease. CT CERVICAL SPINE: 1. No acute traumatic injury within the cervical spine. 2. Moderate degenerative spondylosis C3-4 through C7-T1. 3. Small focus of ground-glass opacity  at the mid right lung apex, nonspecific, but possibly mild pneumonitis. Critical Value/emergent results were called by telephone at the time of interpretation on 12/24/2021 at 10:00 pm to provider [facility] , who verbally acknowledged these results. Electronically Signed   By: Jeannine Boga M.D.   On: 12/24/2021 22:05    Labs:  Basic Metabolic Panel: Recent Labs  Lab 01/16/22 0614 01/22/22 1237  NA 138 141  K 3.9 3.9  CL 104 106  CO2 29 28  GLUCOSE 137* 175*  BUN 13 20  CREATININE 0.92 1.22  CALCIUM 8.8* 9.1    CBC: Recent Labs  Lab 01/16/22 0614 01/18/22 0857  WBC 4.4  --   HGB 12.1* 12.6*  HCT 36.2* 36.8*  MCV 89.6  --   PLT 212  --     CBG: No results for input(s): "GLUCAP" in the last 168 hours.  Family history.  Mother with breast cancer father with lung cancer.  Denies any colon cancer esophageal cancer or rectal cancer  Brief HPI:   Brandon [name] is a 76 y.o. right-handed male with history of hypertension, hyperlipidemia, prostate cancer.  Presented 12/24/2021 with frequent falls.  His wife stated he had been having more issues with balance over the last several months.  He had been using a cane for 3 weeks prior to fall.  Presented after latest fall striking his head on a table with brief loss of consciousness.  Patient cannot recall events of the fall.  On admission imaging revealed large bilateral subacute subdural hematomas with mass effect.  On 12/25/2021 underwent bilateral bur hole evacuation with drain placement per Dr. Earnie Larsson.  Postoperative with slow progressive gains.  He was still struggling with balance and self-care tasks.  His drains were removed 12/28/2021.  He was evaluated by therapies due to decreased functional mobility was admitted for a comprehensive rehab program.   Hospital Course: ZACHARIE PORTNER was admitted to rehab 12/30/2021 for inpatient therapies to consist of PT, ST and OT at least three hours five days a week. Past admission  physiatrist, therapy team and rehab RN have worked together to provide customized collaborative inpatient rehab.  Pertaining to patient bilateral SDH status post bilateral bur hole evacuation 12/25/2021.  Patient would follow-up neurosurgery.  He had been cleared for Lovenox for DVT prophylaxis 8/25.  Latest cranial CT scan showed interval decrease in size of right extra-axial collection and resolution of midline shift.  No new or progressive hemorrhage.  Pain managed with use of Fioricet for headaches as well as scheduled Voltaren  gel.  Ritalin has been added to patient's regimen to establish better focus and attention to tasks.  He was also using trazodone for sleep.  Patient was followed by neuropsychology during his hospital rehabilitation stay.  Patient had initially been maintained on Keppra for seizure prophylaxis completing course no seizure activity noted.  History of prostate cancer with some urinary incontinence maintained on Flomax latest urinalysis negative.  History of hypertension maintained on HCTZ and lisinopril prior to admission.  Lipitor ongoing for hyperlipidemia.   Blood pressures were monitored on TID basis and soft and monitored     Rehab course: During patient's stay in rehab weekly team conferences were held to monitor patient's progress, set goals and discuss barriers to discharge. At admission, patient required min mod assist sit to stand min max assist 15 feet rolling walker  Physical exam.  Blood pressure 125/72 pulse 88 temperature 97.8 respirations 16 oxygen saturations 94% room air Constitutional.  No acute distress HEENT Head.  Bilateral craniotomy site scars noted Eyes.  Pupils round and reactive to light no discharge without nystagmus Neck.  Supple nontender no JVD without thyromegaly Cardiac regular rate and rhythm without any extra sounds or murmur heard Abdomen.  Soft nontender positive bowel sounds without rebound Respiratory effort normal no respiratory  distress without wheeze Musculoskeletal. Comment.  Crepitus bilateral knees.  Patient demonstrated joint line tenderness left knee more than right.  No obvious joint effusion Skin.  Warm and dry Crani sites clean and dry Neurologic.  Alert oriented to person place and month.  He was hard of hearing.  Fair insight and awareness.  Noted some short-term memory deficits.  Upper extremity grossly 3+ to 4+/5 proximal to distal.  Lower extremities 3+ hip flexors, 4 KE and 4+ ADF/PF.  No sensory findings noted.  He/She  has had improvement in activity tolerance, balance, postural control as well as ability to compensate for deficits. He/She has had improvement in functional use RUE/LUE  and RLE/LLE as well as improvement in awareness.  Supine to sitting edge of bed minimal assist for trunk support.  Required min assist for unsupported sitting balance.  Propelled wheelchair supervision.  Ambulates 150 feet minimal assist rolling walker.  Requires minimal assist for static standing and rolling walker support.  Completed bed mobility initiated sitting up with minimal assist but had difficulty achieving sitting balance at times.  Patient donned shoes with some increased time with supervision.  Completed stand pivot to Tis wheelchair.  SLP facilitated sessions by providing min mod assist verbal cues for functional problem-solving during basic medication management.  Patient recalled his previous medications with 75% accuracy and recall the function of his current medications with 50% accuracy.  Recommendations organizing a pillbox and discussed with family.  Full family teaching completed plan discharge to home       Disposition: Discharge to home    Diet: Regular  Special Instructions: No driving smoking or alcohol  Medications at discharge 1.  Tylenol as needed 2.  Lipitor 20 mg p.o. daily 3.  B complex with vitamin C 1 tablet daily 4.  Fioricet 50 - 325 - 40 mg 1 tablet every 6 hours as needed  headache 5.  Vitamin D 1000 units p.o. daily 6.  Voltaren gel 1% 3 times a day to affected area 7.  Pro scar 5 mg p.o. daily 7.  HCTZ 25 mg p.o. daily 8.Xalatan ophthalmic solution 0.05% 1 drop both eyes bedtime 9.  Lisinopril 20 mg p.o. daily 10.  Ritalin 5  mg p.o. daily with breakfast 11.  Protonix 40 mg p.o. nightly 12.  Senokot S1 tablet nightly 13.  Flomax 0.4 mg p.o. twice daily 14.  Trazodone 100 mg p.o. nightly as needed sleep  30-35 minutes were spent completing discharge summary and discharge planning Discharge Instructions     Ambulatory referral to Physical Medicine Rehab   Complete by: As directed    Moderate complexity follow-up 1 to 2 weeks traumatic SDH        Follow-up Information     Durel Salts C, DO Follow up.   Specialty: Physical Medicine and Rehabilitation Why: Office to call for appointment Contact information: [address] [place] [address] [phone]         Earnie Larsson, MD Follow up.   Specialty: Neurosurgery Why: Call for appointment Contact information: [address] [place] [address] [phone]                 Signed: Cathlyn Parsons 01/23/2022, 5:31 AM

## 2022-01-17 NOTE — Progress Notes (Signed)
Speech Language Pathology TBI Note  Patient Details  Name: Brandon [name] MRN: [id number] Date of Birth: [birth year]  Today's Date: 01/17/2022 SLP Individual Time: 3532-9924 SLP Individual Time Calculation (min): 60 min  Short Term Goals: Week 3: SLP Short Term Goal 1 (Week 3): Pt will sustain his attention to basic, familiar tasks for 10-12 minute intervals wtih mod cues for redirection. SLP Short Term Goal 2 (Week 3): Pt will utilize external aids to recall orientation information with min verbal and visual cues. SLP Short Term Goal 3 (Week 3): Patient will demonstrate functional problem solving with basic and familiar tasks with Mod verbal cues. SLP Short Term Goal 4 (Week 3): Patient will verbally express his basic wants/needs at the phrase level with Mod A multimodal cues. SLP Short Term Goal 5 (Week 3): Patient will self-monitor and correct errors at the word level during a structured language task with Min A multimodal cues.  Skilled Therapeutic Interventions: Skilled treatment session focused on cognitive goals. Upon arrival, patient was awake while supine in bed. Patient sat EOB with Min A and required assistance from his son to maintain sitting balance as patient had a posterior lean while SLP donning his TED hoes and shoes. Patient was transferred to the wheelchair via the [facility] with SLP providing overall Mod-Max A, again due to a heavy posterior lean. Patient completed basic self-care tasks at the sink with extra time due. Patient was independently oriented to place, time and situation today at beginning of session. SLP facilitated session by providing overall Mod A verbal and visual cues for functional problem solving and error awareness during a complex medication management task. No word-finding deficits observed today. Patient left upright in wheelchair with son present. Continue with current plan of care.      Pain Pain Assessment Pain Scale: 0-10 Pain Score: 0-No  pain  Agitated Behavior Scale: TBI  ABS discontinued d/t ABS score less than 20 for the last three days or no behaviors present   Therapy/Group: Individual Therapy  Analysse Quinonez 01/17/2022, 10:05 AM

## 2022-01-17 NOTE — Progress Notes (Signed)
PROGRESS NOTE   Subjective/Complaints:  Doing well this AM, seen working with SLP this AM. Son at bedside. No questions or concerns. Patient states he had some interrupted sleep due to urinary incontinence, unchanged. Otherwise doing well. Headaches manageable with tylenol.   ROS:    Denies fevers, chills, N/V, abdominal pain, constipation, diarrhea, SOB, cough, chest pain, new weakness or paraesthesias.     Objective:  Recent Labs    01/16/22 0614  WBC 4.4  HGB 12.1*  HCT 36.2*  PLT 212    Recent Labs    01/16/22 0614  NA 138  K 3.9  CL 104  CO2 29  GLUCOSE 137*  BUN 13  CREATININE 0.92  CALCIUM 8.8*      Intake/Output Summary (Last 24 hours) at 01/17/2022 1143 Last data filed at 01/17/2022 0936 Gross per 24 hour  Intake 360 ml  Output --  Net 360 ml        Physical Exam: Vital Signs Blood pressure 132/77, pulse 75, temperature 97.7 F (36.5 C), temperature source Oral, resp. rate 14, height 6' 6.5" (1.994 m), weight 102.2 kg, SpO2 96 %.    General: awake, alert, appropriate,NAD HENT: conjugate gaze; oropharynx moist CV: regular rate; no JVD Pulmonary: CTA B/L; no W/R/R- good air movement GI: soft, NT, ND, (+)BS Psych: Pt's affect is appropriate. Pt is cooperative. Skin: crani incisions cdi, s/p staple removal. + mepilex dressing L knee.  Neuro: CN 2-12 grossly intact. Follows all commands. AAOx4.   Musculoskeletal: Moves all 4 extremities against resistance.    Assessment/Plan: 1. Functional deficits which require 3+ hours per day of interdisciplinary therapy in a comprehensive inpatient rehab setting. Physiatrist is providing close team supervision and 24 hour management of active medical problems listed below. Physiatrist and rehab team continue to assess barriers to discharge/monitor patient progress toward functional and medical goals  Care Tool:  Bathing    Body parts bathed by  patient: Face, Front perineal area   Body parts bathed by helper: Right arm, Left arm, Chest, Abdomen, Buttocks, Right upper leg, Left upper leg, Right lower leg, Left lower leg     Bathing assist Assist Level: Maximal Assistance - Patient 24 - 49%     Upper Body Dressing/Undressing Upper body dressing   What is the patient wearing?: Pull over shirt    Upper body assist Assist Level: Maximal Assistance - Patient 25 - 49%    Lower Body Dressing/Undressing Lower body dressing      What is the patient wearing?: Pants, Incontinence brief     Lower body assist Assist for lower body dressing: 2 Helpers     Toileting Toileting    Toileting assist Assist for toileting: Total Assistance - Patient < 25%     Transfers Chair/bed transfer  Transfers assist  Chair/bed transfer activity did not occur: Safety/medical concerns  Chair/bed transfer assist level: Contact Guard/Touching assist     Locomotion Ambulation   Ambulation assist      Assist level: Minimal Assistance - Patient > 75% Assistive device: Walker-rolling Max distance: 84 ft   Walk 10 feet activity   Assist     Assist level: Minimal Assistance - Patient >  75% Assistive device: Walker-rolling   Walk 50 feet activity   Assist Walk 50 feet with 2 turns activity did not occur: Safety/medical concerns  Assist level: Minimal Assistance - Patient > 75% Assistive device: Walker-rolling    Walk 150 feet activity   Assist Walk 150 feet activity did not occur: Safety/medical concerns         Walk 10 feet on uneven surface  activity   Assist Walk 10 feet on uneven surfaces activity did not occur: Safety/medical concerns         Wheelchair     Assist Is the patient using a wheelchair?: Yes Type of Wheelchair: Manual Wheelchair activity did not occur: Safety/medical concerns  Wheelchair assist level: Minimal Assistance - Patient > 75% Max wheelchair distance: 150 ft    Wheelchair 50  feet with 2 turns activity    Assist    Wheelchair 50 feet with 2 turns activity did not occur: Safety/medical concerns   Assist Level: Supervision/Verbal cueing   Wheelchair 150 feet activity     Assist  Wheelchair 150 feet activity did not occur: Safety/medical concerns   Assist Level: Minimal Assistance - Patient > 75%   Blood pressure 132/77, pulse 75, temperature 97.7 F (36.5 C), temperature source Oral, resp. rate 14, height 6' 6.5" (1.994 m), weight 102.2 kg, SpO2 96 %.  Medical Problem List and Plan: 1. Functional deficits secondary to bilateral SDH's after fall s/p bilateral burr hole evacuation 8/14             -patient may shower             -ELOS/Goals: 12-14 days, supervision to mod I goals with PT, OT, SLP  Continue CIR- PT, OT and SLP   2.  Antithrombotics: -DVT/anticoagulation:  Mechanical:  Antiembolism stockings, knee (TED hose) Bilateral lower extremities Sequential compression devices, below knee Bilateral lower extremities             - CT head 8/24 stable -> Lovenox 40 IM mg daily for DVT prophy 8/25             -antiplatelet therapy: n/a  3. Pain Management/headache: ongoing             -Tylenol 1000 mg TID scheduled and DC hydrocodone 8/22 -> Fioricet 1 tab TID + Tylenol 500 mg BID PRN for HA, pain 8/25 -> Fioricet/tylenol PRN 8/28; restart scheduled as not getting PRNs despite ongoing HA 8/29            - Gabapentin 100 mg BID for persistent HA started 8/28 -> DC 8/29 d/t worsening cognition  9/2- denies pain today injection likely was extremely helpful 4. Mood/Behavior/Sleep - improved:              -pt seems fairly up beat and wife is very supportive             -antipsychotic agents: n/a             - 8/26 Altered sleep wake cycle- trazodone increase trazodone to '75mg'$  for sleep-> 100 mg 8/29                           - Retimed to 1900 9/1  - improved sleep and nighttime attention per family                 5. Neuropsych/cognition: This  patient is not quite capable of making decisions on his own behalf.            -  Given persistent slow processing and attention despite delirum workup, in setting of TBI, will trial Ritalin 5 mg BID 8/29 to improve arousal and participation -> reduce to 5 mg daily d/t nighttime hyperarrousal 8/31- improved  6. Skin/Wound Care: local care to scalp, removed staples 8/25    7. Fluids/Electrolytes/Nutrition: encourage appropriate po intake             -liberalized him to regular diet to help with po intake. Encouraged wife to bring food from home as well. Poor po intake so far however.              -check labs Monday - stable  8. Seizure prophylaxis: keppra '500mg'$  q 12 hours -> DC 8/23 9. Hx of Prostate Cancer, c/b urinary incontinence - ongoing             -flomax 0.'4mg'$  bid, proscar '5mg'$  daily              -emptying with intermittent incontinence thus far              - UA 8/23 without s/s infection             - Discussed incontinence of bowel and bladder with family in detail 8/25; they feel he is now close to his baseline, which is intermittently incontinent of both                  10. HTN: well controlled             -hctz/lisinopril daily per home regimen; monitor for orthostasis  11. 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 Delirium, likely multifactorial etiology (post-hospitalization, polypharmacy, TBI) -  resolved - Ensure wearing hearing aides; wife has microphone available to amplify  - UA 8/23 stable; CBC 8/21-22 stable, no leukocytosis - Delirium precautions added and discussed with family - Keppra may be contributing, DC 8/23  - repeat CT head  - ordered 8/24 - stable - initiate sleep log 8/24 - reinforce 8/29 - DC PRN compazine and zofran, not using and has cog s/e   13.  Constipation/diarrhea- LBM 9/4, continent, improved - PRN suppository 8/27, with results - Daily Miralax + 1x dose 8/29 -> PRN 9/4 - Sennakot-S 1 TAB QHS 8/28 -> BID 8/29 -> QHS 9/4 - PRN suppository 8/29 - +results  Hypokalemia:   -K+ 3.3 , KCL supplement -> 3.7 8/28-> 3.3 9/1 on 40 meq daily - repeat 9/5 3.9; continue on 20 MEQ daily and recheck 9/11  LOS: 18 days A FACE TO FACE EVALUATION WAS PERFORMED  Est Dispo: Tuesday, Sept 12th to home with family assistance   Gertie Gowda, DO 01/17/2022

## 2022-01-18 LAB — HEMOGLOBIN AND HEMATOCRIT, BLOOD
HCT: 36.8 % — ABNORMAL LOW (ref 39.0–52.0)
Hemoglobin: 12.6 g/dL — ABNORMAL LOW (ref 13.0–17.0)

## 2022-01-18 MED ORDER — BUTALBITAL-APAP-CAFFEINE 50-325-40 MG PO TABS
1.0000 | ORAL_TABLET | Freq: Four times a day (QID) | ORAL | Status: DC | PRN
Start: 2022-01-18 — End: 2022-01-22

## 2022-01-18 NOTE — Progress Notes (Addendum)
PROGRESS NOTE   Subjective/Complaints:  Doing well this AM, son and wife at bedside. No acute complaints. No events overnight. Headaches well controlled.   ROS:  Denies fevers, chills, N/V, abdominal pain, constipation, diarrhea, SOB, cough, chest pain, new weakness or paraesthesias.     Objective:  Recent Labs    01/16/22 0614 01/18/22 0857  WBC 4.4  --   HGB 12.1* 12.6*  HCT 36.2* 36.8*  PLT 212  --     Recent Labs    01/16/22 0614  NA 138  K 3.9  CL 104  CO2 29  GLUCOSE 137*  BUN 13  CREATININE 0.92  CALCIUM 8.8*      Intake/Output Summary (Last 24 hours) at 01/18/2022 1206 Last data filed at 01/18/2022 0835 Gross per 24 hour  Intake 880 ml  Output 250 ml  Net 630 ml        Physical Exam: Vital Signs Blood pressure 127/81, pulse 75, temperature 97.9 F (36.6 C), temperature source Oral, resp. rate 14, height 6' 6.5" (1.994 m), weight 102.2 kg, SpO2 96 %.    General: awake, alert, appropriate,NAD HENT: conjugate gaze; oropharynx moist CV: regular rate; no JVD Pulmonary: CTA B/L; no W/R/R- good air movement GI: soft, NT, ND, (+)BS Psych: Pt's affect is appropriate. Pt is cooperative. Skin: crani incisions cdi, s/p staple removal.  Neuro: CN 2-12 grossly intact. Follows all commands. AAOx4. Intact to abstraction. Cannot perform basic math.    Musculoskeletal: Moves all 4 extremities against resistance.    Assessment/Plan: 1. Functional deficits which require 3+ hours per day of interdisciplinary therapy in a comprehensive inpatient rehab setting. Physiatrist is providing close team supervision and 24 hour management of active medical problems listed below. Physiatrist and rehab team continue to assess barriers to discharge/monitor patient progress toward functional and medical goals  Care Tool:  Bathing    Body parts bathed by patient: Face, Front perineal area   Body parts bathed by  helper: Right arm, Left arm, Chest, Abdomen, Buttocks, Right upper leg, Left upper leg, Right lower leg, Left lower leg     Bathing assist Assist Level: Maximal Assistance - Patient 24 - 49%     Upper Body Dressing/Undressing Upper body dressing   What is the patient wearing?: Pull over shirt    Upper body assist Assist Level: Maximal Assistance - Patient 25 - 49%    Lower Body Dressing/Undressing Lower body dressing      What is the patient wearing?: Pants, Incontinence brief     Lower body assist Assist for lower body dressing: 2 Helpers     Toileting Toileting    Toileting assist Assist for toileting: Total Assistance - Patient < 25%     Transfers Chair/bed transfer  Transfers assist  Chair/bed transfer activity did not occur: Safety/medical concerns  Chair/bed transfer assist level: Minimal Assistance - Patient > 75% Chair/bed transfer assistive device: Armrests, Programmer, multimedia   Ambulation assist      Assist level: Minimal Assistance - Patient > 75% Assistive device: Walker-rolling Max distance: 137f   Walk 10 feet activity   Assist     Assist level: Minimal Assistance - Patient >  75% Assistive device: Walker-rolling   Walk 50 feet activity   Assist Walk 50 feet with 2 turns activity did not occur: Safety/medical concerns  Assist level: Minimal Assistance - Patient > 75% Assistive device: Walker-rolling    Walk 150 feet activity   Assist Walk 150 feet activity did not occur: Safety/medical concerns         Walk 10 feet on uneven surface  activity   Assist Walk 10 feet on uneven surfaces activity did not occur: Safety/medical concerns         Wheelchair     Assist Is the patient using a wheelchair?: Yes Type of Wheelchair: Manual Wheelchair activity did not occur: Safety/medical concerns  Wheelchair assist level: Minimal Assistance - Patient > 75% Max wheelchair distance: 150 ft    Wheelchair 50  feet with 2 turns activity    Assist    Wheelchair 50 feet with 2 turns activity did not occur: Safety/medical concerns   Assist Level: Supervision/Verbal cueing   Wheelchair 150 feet activity     Assist  Wheelchair 150 feet activity did not occur: Safety/medical concerns   Assist Level: Minimal Assistance - Patient > 75%   Blood pressure 127/81, pulse 75, temperature 97.9 F (36.6 C), temperature source Oral, resp. rate 14, height 6' 6.5" (1.994 m), weight 102.2 kg, SpO2 96 %.  Medical Problem List and Plan: 1. Functional deficits secondary to bilateral SDH's after fall s/p bilateral burr hole evacuation 8/14             -patient may shower             -ELOS/Goals: 12-14 days, supervision to mod I goals with PT, OT, SLP  Continue CIR- PT, OT and SLP   2.  Antithrombotics: -DVT/anticoagulation:  Mechanical:  Antiembolism stockings, knee (TED hose) Bilateral lower extremities Sequential compression devices, below knee Bilateral lower extremities             - CT head 8/24 stable -> Lovenox 40 IM mg daily for DVT prophy 8/25             -antiplatelet therapy: n/a  3. Pain Management/headache: ongoing             -Tylenol 1000 mg TID scheduled and DC hydrocodone 8/22 -> Fioricet 1 tab TID + Tylenol 500 mg BID PRN for HA, pain 8/25 -> Fioricet/tylenol PRN 8/28; restart scheduled as not getting PRNs despite ongoing HA 8/29            - Gabapentin 100 mg BID for persistent HA started 8/28 -> DC 8/29 d/t worsening cognition  9/2- denies pain today injection likely was extremely helpful 4. Mood/Behavior/Sleep - improved:              -pt seems fairly up beat and wife is very supportive             -antipsychotic agents: n/a             - 8/26 Altered sleep wake cycle- trazodone increase trazodone to '75mg'$  for sleep-> 100 mg 8/29                           - Retimed to 1900 9/1  - improved sleep and nighttime attention per family                 5. Neuropsych/cognition: This  patient is not quite capable of making decisions on his own behalf.            -  Given persistent slow processing and attention despite delirum workup, in setting of TBI, will trial Ritalin 5 mg BID 8/29 to improve arousal and participation -> reduce to 5 mg daily d/t nighttime hyperarrousal 8/31- improved  6. Skin/Wound Care: local care to scalp, removed staples 8/25    7. Fluids/Electrolytes/Nutrition: encourage appropriate po intake             -liberalized him to regular diet to help with po intake. Encouraged wife to bring food from home as well. Poor po intake so far however.              -check labs Monday - stable  8. Seizure prophylaxis: keppra '500mg'$  q 12 hours -> DC 8/23 9. Hx of Prostate Cancer, c/b urinary incontinence - ongoing             -flomax 0.'4mg'$  bid, proscar '5mg'$  daily              -emptying with intermittent incontinence thus far              - UA 8/23 without s/s infection             - Discussed incontinence of bowel and bladder with family in detail 8/25; they feel he is now close to his baseline, which is intermittently incontinent of both                  10. HTN: well controlled             -hctz/lisinopril daily per home regimen; monitor for orthostasis  11. 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 Delirium, likely multifactorial etiology (post-hospitalization, polypharmacy, TBI) -  resolved - Ensure wearing hearing aides; wife has microphone available to amplify  - UA 8/23 stable; CBC 8/21-22 stable, no leukocytosis - Delirium precautions added and discussed with family - Keppra may be contributing, DC 8/23  - repeat CT head  - ordered 8/24 - stable - initiate sleep log 8/24 - reinforce 8/29 - DC PRN compazine and zofran, not using and has cog s/e   13.  Constipation/diarrhea- LBM 9/6, large, liquid, black - PRN suppository 8/27, with results - Daily Miralax + 1x dose 8/29 -> PRN 9/4 - Sennakot-S 1 TAB QHS 8/28 -> BID 8/29 -> QHS 9/4 - PRN suppository 8/29 - +results - h/h today given large black BM  - stable  Hypokalemia:   -K+ 3.3 , KCL supplement -> 3.7 8/28-> 3.3 9/1 on 40 meq daily - repeat 9/5 3.9; continue on 20 MEQ daily and recheck 9/11  LOS: 19 days A FACE TO FACE EVALUATION WAS PERFORMED  Est Dispo: Tuesday, Sept 12th to home with family assistance   Gertie Gowda, DO 01/18/2022

## 2022-01-18 NOTE — Progress Notes (Signed)
Physical Therapy TBI Note  Patient Details  Name: Brandon [name] MRN: [id number] Date of Birth: December 03, 1945  Today's Date: 01/18/2022 PT Individual Time: 2355-7322 and 1430-1530 PT Individual Time Calculation (min): 40 min   Short Term Goals: Week 3:  PT Short Term Goal 1 (Week 3): STGs = LTGs  Skilled Therapeutic Interventions/Progress Updates:     Session 1: Patient in bed with MD, his wife, and his son in the room upon PT arrival. Patient alert and agreeable to PT session. Patient denied pain during session.  Noted reduced sustained attention and increased shuffling gait and instability in gait compared to this weekend when this provider last saw the patient. Patient reports increased intensity during therapies yesterday and interrupted sleep last night. Educated patient and family on fluctuations in behavior and physical deficits in relation to sleep changes, novel tasks, increased environmental stimulation, and with fatigue during session.   Patient's wife and son were present for family education and hands on training throughout session. Performed safe guarding with all mobility following PT cues and/or demonstration. Educated on fall risk/prevention, home modifications to prevent falls, and activation of emergency services in the event of a fall during session.   Therapeutic Activity: Bed Mobility: Patient performed supine to/from sit to the L with HOB elevated and without use of bed rails to simulate home set up, required x3 attempts and min A due to increased posterior and R lean with change in direction and not using the rails for the first time. Provided verbal cues for promoting forward weight shift with cross body reaching and placing his feet on the floor. Transfers: Patient performed sit to/from stand from the bed, a standard arm chair, and TIS w/c x2 with CGA, x2 attempts for lower seat height. Provided verbal cues for scooting forward, forward weight shift, completing turns  before sitting, and reaching back to sit with poor carry-over of cues during session. Educated on placing a cushion to elevate lower seat heights to improved patient's balance and reduce burden of care with transfers due to patient's increased height and posterior bias.  Patient performed a simulated SUV height car transfer with CGA using RW. Provided cues for safe technique, patient able to teach back safe technique performed in yesterday's PT session with Carly, PT.  Gait Training:  Patient ambulated >100 ft feet using RW with CGA. Ambulated with decreased gait speed, decreased step length and height R>L, narrow BOS, increased forward trunk lean, and downward head gaze. Provided verbal cues for erect posture, looking ahead, increased R step length and height, guarding the patient on his posterior/R side due to increased fall risk in this direction, and underhand grip on gait belt for improved safety with assist. Patient ascended/descended 1x8" step x2, to simulate 1+1 STE home, using RW with min A. Performed Step-to gait pattern leading with L while ascending and R while descending. Provided cues for technique and sequencing. Patient had not performed this task before and had significant LOB after coming down the step requiring mod A +2. Utilized LOB as a teachable moment to reiterate causes of reduced functional performance and factors that increased fall risk. Family appreciative of education. Patient's son assisted with correcting LOB and guarded patient well. Discussed lowering patient to the ground as a safe option if he had been by himself to prevent personal injury when assisting the patient.  Patient ambulated up/down a ramp to simulate sloped sidewalk path to access his home with CGA using RW. Provided cues for technique and use  of AD. Also educated on using w/c from the car to the steps to minimize fall risk initially. Recommended use of w/c for community mobility at this time for safety, as  patient is at increased risk of falls over unlevel surfaces and in novel environments and with increased sensory stimulation due to deficits in attention and awareness, as well as hearing impairments.   Patient's family appreciative of education and requesting an additional day of education to continue hands on training/practice, CSW made aware.  Patient in [place] w/c with his family in the main gym handed off to [place], [place], for continued family training at end of session.  Session 2: Patient in bed with his wife at bedside upon PT arrival. Patient alert and agreeable to PT session. Patient denied pain during session.   Noted patient with increased instability and fatigue throughout session. Patient's wife reports he did not nap today which is unusual for him.  Focused session on w/c mobility for assessment of transport vs manual w/c for community mobility at d/c and functional mobility training.   Therapeutic Activity: Bed Mobility: Patient performed supine to/from sit with min-mod A, as above.  Transfers: Patient performed squat pivot bed<>manual w/c with min/mod A to facilitate pivot due to patient undershooting the seat on both transfers. He performed sit to/from stand x with 4 with blocked practice for improved control on descent with reaching back to sit due to poor carry-over from previous sessions.   Patient's wife performed toilet transfer with patient, as OT cleared his wife and son for transfers in the room. His wife required mod cues for recall of safe technique and patient required max cues for attention for safety due to decreased attention and fatigue. Patient was continent of bladder during toileting, performed peri-care and lower body clothing management with total A from his wife. Recommended his wife call nursing staff for assistance and continue to practice toilet transfers via w/c and stand pivot w/c<>BSC with staff in the room for patient safety. Patient and his wife in  agreement.   Gait Training:  Patient ambulated 180 feet using RW with CGA. Ambulated as above with improved step length and height and increased cues for navigating around objects on R and L.   Wheelchair Mobility:  Patient propelled wheelchair >100 feet x2 first with B lower extremities, upper extremities, and with both upper and lower extremities. Patient with increased R veering requiring min A to correct and decreased frustration tolerance with B upper extremities, improved with only occasional min A with addition of B lower extremities, supervision with min cues for steering and attention with B lower extremities only. Recommending 18"x18" transport chair for patient safety with increased independence with community locomotion using B lower extremities and reduced caregiver burden.   Patient in bed with his wife at bedside at end of session with breaks locked bed and all needs within reach.   Therapy Documentation Precautions:  Precautions Precautions: Fall Restrictions Weight Bearing Restrictions: No    Therapy/Group: Individual Therapy  Ronalee Scheunemann L Mckinley Olheiser PT, DPT, NCS, CBIS  01/18/2022, 12:33 PM

## 2022-01-18 NOTE — Progress Notes (Addendum)
Patient ID: Brandon [name], male   DOB: June 20, 1945, 77 y.o.   MRN: [id number]  SW met with pt wife in room to discuss family education with her and their son. Will confirm best day after speaking with her son. SW informed will follow-up with DME recommendations. SW provided [facility] list and will follow-up about preference.   *SW ordered transport chair and RW with [place] via parachute.   Loralee Pacas, MSW, [place] Office: [phone] Cell: [phone] Fax: [phone]

## 2022-01-18 NOTE — Progress Notes (Signed)
Speech Language Pathology TBI Note  Patient Details  Name: Brandon [name] MRN: [id number] Date of Birth: 1946/03/29  Today's Date: 01/18/2022 SLP Individual Time: 9242-6834 SLP Individual Time Calculation (min): 55 min  Short Term Goals: Week 3: SLP Short Term Goal 1 (Week 3): Pt will sustain his attention to basic, familiar tasks for 10-12 minute intervals wtih mod cues for redirection. SLP Short Term Goal 2 (Week 3): Pt will utilize external aids to recall orientation information with min verbal and visual cues. SLP Short Term Goal 3 (Week 3): Patient will demonstrate functional problem solving with basic and familiar tasks with Mod verbal cues. SLP Short Term Goal 4 (Week 3): Patient will verbally express his basic wants/needs at the phrase level with Mod A multimodal cues. SLP Short Term Goal 5 (Week 3): Patient will self-monitor and correct errors at the word level during a structured language task with Min A multimodal cues.  Skilled Therapeutic Interventions: Skilled treatment session focused on completion of patient and family education with the patient, his wife, and his son. SLP facilitated session by providing education regarding patient's current cognitive-linguistic deficits and strategies to utilize at home to maximize word-finding, recall, attention and overall safety at discharge. SLP reinforced the importance of 24 hour supervision and provided examples of tasks/activities patient can participate in at home to stay cognitively engaged. SLP also emphasized the importance of living a "healthy brian lifestyle." The patient and family verbalized understanding of education provided and handouts were given to reinforce information. Patient left upright in wheelchair with family present. Continue with current plan of care.      Pain No/Denies Pain   Agitated Behavior Scale: TBI  ABS discontinued d/t ABS score less than 20 for the last three days or no behaviors  present    Therapy/Group: Individual Therapy  Brandon [name] 01/18/2022, 1:01 PM

## 2022-01-18 NOTE — Progress Notes (Signed)
Occupational Therapy Session Note  Patient Details  Name: Brandon [name] MRN: [id number] Date of Birth: [birth year]  Today's Date: 01/18/2022 OT Individual Time: 0950-1030 OT Individual Time Calculation (min): 40 min    Short Term Goals: Week 3:  OT Short Term Goal 1 (Week 3): LTG=STG 2/2 ELOS  Skilled Therapeutic Interventions/Progress Updates:    Pt greeted handoff from PT for family education. Pt brought to therapy apartment and practiced tub bench transfer in simulated home environment. Educated on providing appropriate cues for safety and technique. Ambualtory transfer into bathing with RW and Min A. Moderate cues for technique for novel task. When son practiced, pt tried to step over ledge, but son did a good job re-orienting and keeping pt safe. OT Educated on DME needs, 3-in-1 BSC, tub bench, and suction cup grab bar. Educated on falls risk, fatigue, and energy conservation techniques. Practiced toilet transfer with pt and son using grab bars and BSC over toilet. Pt handoff to SLP for next therapy session .  Therapy Documentation Precautions:  Precautions Precautions: Fall Restrictions Weight Bearing Restrictions: No Pain: Pain Assessment Pain Scale: 0-10 Pain Score: 0-No pain   Therapy/Group: Individual Therapy  Valma Cava 01/18/2022, 11:09 AM

## 2022-01-19 NOTE — Progress Notes (Signed)
Speech Language Pathology TBI Note  Patient Details  Name: Brandon [name] MRN: [id number] Date of Birth: December 25, 1945  Today's Date: 01/19/2022 SLP Individual Time: 1450-1535 SLP Individual Time Calculation (min): 45 min  Short Term Goals: Week 3: SLP Short Term Goal 1 (Week 3): Pt will sustain his attention to basic, familiar tasks for 10-12 minute intervals wtih mod cues for redirection. SLP Short Term Goal 2 (Week 3): Pt will utilize external aids to recall orientation information with min verbal and visual cues. SLP Short Term Goal 3 (Week 3): Patient will demonstrate functional problem solving with basic and familiar tasks with Mod verbal cues. SLP Short Term Goal 4 (Week 3): Patient will verbally express his basic wants/needs at the phrase level with Mod A multimodal cues. SLP Short Term Goal 5 (Week 3): Patient will self-monitor and correct errors at the word level during a structured language task with Min A multimodal cues.  Skilled Therapeutic Interventions: Skilled ST treatment focused on cognitive goals. Pt greeted supine with spouse at bedside. Pt agreeable to exit bed to wheelchair. Required min A to don shoes. Min-to-mod A needed for sitting balance due to posterior lean. Transferred via squat pivot with min A and verbal cues to ensure w/c was in optimal position prior to standing. SLP facilitated session in quiet, private therapy room where pt sustained attention to functional problem solving tasks for 15 minute interval with min-to-mod verbal redirection cues. Pt known to fidget with therapy supplies within reach throughout session. SLP facilitated "solving daily math problems" subtest with the ALFA. Pt achieved 4/10 accurate given extended processing time, progressing to 8/10 with max A verbal/visual cues. Pt with minimal awareness of errors. Patient transferred to bed with min A and again verbal cues needed for safety due to attempts to stand prior to safety measures in place (breaks  locked, foot rests moved to the side, w/c aligned with bed). Pt was left in bed with alarm activated and immediate needs within reach at end of session. Continue per current plan of care.      Pain  None/denied  Agitated Behavior Scale: TBI Observation Details Observation Environment: CIR Start of observation period - Date: 01/19/22 Start of observation period - Time: 1450 End of observation period - Date: 01/19/22 End of observation period - Time: 1535 Agitated Behavior Scale (DO NOT LEAVE BLANKS) Short attention span, easy distractibility, inability to concentrate: Present to a slight degree Impulsive, impatient, low tolerance for pain or frustration: Absent Uncooperative, resistant to care, demanding: Absent Violent and/or threatening violence toward people or property: Absent Explosive and/or unpredictable anger: Absent Rocking, rubbing, moaning, or other self-stimulating behavior: Absent Pulling at tubes, restraints, etc.: Absent Wandering from treatment areas: Absent Restlessness, pacing, excessive movement: Absent Repetitive behaviors, motor, and/or verbal: Absent Rapid, loud, or excessive talking: Absent Sudden changes of mood: Absent Easily initiated or excessive crying and/or laughter: Absent Self-abusiveness, physical and/or verbal: Absent Agitated behavior scale total score: 15  Therapy/Group: Individual Therapy  Patty Sermons 01/19/2022, 4:58 PM

## 2022-01-19 NOTE — Progress Notes (Signed)
Physical Therapy TBI Note  Patient Details  Name: Brandon [name] MRN: [id number] Date of Birth: December 05, 1945  Today's Date: 01/19/2022 PT Individual Time: 1324-4010 PT Individual Time Calculation (min): 74 min   Short Term Goals: Week 1:  PT Short Term Goal 1 (Week 1): Pt will transfer sup to sit w/ min A. PT Short Term Goal 1 - Progress (Week 1): Progressing toward goal PT Short Term Goal 2 (Week 1): Pt will transfer sit to stand w/ min A PT Short Term Goal 2 - Progress (Week 1): Progressing toward goal PT Short Term Goal 3 (Week 1): Pt will amb x 50' w/ RW and min A PT Short Term Goal 3 - Progress (Week 1): Progressing toward goal PT Short Term Goal 4 (Week 1): Pt will assess stairs. PT Short Term Goal 4 - Progress (Week 1): Progressing toward goal Week 2:  PT Short Term Goal 1 (Week 2): Pt will complete bed mobility consistently with minA. PT Short Term Goal 1 - Progress (Week 2): Met PT Short Term Goal 2 (Week 2): Pt will complete sit to stand transfer consistently with minA. PT Short Term Goal 2 - Progress (Week 2): Met PT Short Term Goal 3 (Week 2): Pt will perform bed to chair transfer consistently with minA. PT Short Term Goal 3 - Progress (Week 2): Progressing toward goal PT Short Term Goal 4 (Week 2): Pt will ambulate x50' with modA +2 and LRAD. PT Short Term Goal 4 - Progress (Week 2): Met Week 3:  PT Short Term Goal 1 (Week 3): STGs = LTGs Week 4:     Skilled Therapeutic Interventions/Progress Updates:   Pt/wife seen for session w/emphasis on home entry navigation/family trainig.  Pt transported to gym. Home entry simulation w/4in curb/landing 4in threshold set up for session as described by wife. Therapist demonstrated the following options: Navigation via forward approach in/out of home Navigation via backing w/wc at entry to limit step up backing x 1 Buping up via wc.  Pt performed each option w/+2 min assist, wife observed. Wife acted as pt, therapist demonstrated  guarding  Wife + therapist then guarded pt for hands on experience using forward approach.  Pt ascended 2 steps w/+2 min assist, wife required cueing for body posiitoing, during turning pt w/post lob rquring max assist/chair  provided at top of stair by tech, max assist to sit safely   following seated rest pt able to descend w/min assist of 2 as above.  Heavy cueing for safe sequencing/hand placement, etc.  Wife instructed w/parts management, folding, loading wc into trunk.  Wife practriced all w/cues only.  Pt left supine w/rails up x 3, alarm set, bed in lowest position, and needs in reach.   Therapy Documentation Precautions:  Precautions Precautions: Fall Restrictions Weight Bearing Restrictions: No   Therapy/Group: Individual Therapy Callie Fielding, [place] 01/19/2022, 2:13 PM

## 2022-01-19 NOTE — Progress Notes (Signed)
Occupational Therapy TBI Note  Patient Details  Name: Brandon [name] MRN: [id number] Date of Birth: 1945/12/26  Today's Date: 01/19/2022 OT Individual Time: 0254-2706 OT Individual Time Calculation (min): 72 min    Short Term Goals: Week 3:  OT Short Term Goal 1 (Week 3): LTG=STG 2/2 ELOS  Skilled Therapeutic Interventions/Progress Updates:    Pt greeted semi-reclined in bed and agreeable to OT treatment session. Per spouse, pt washed up, dressed, and ready for therapy. Functional ambulation to therapy gym with RW, min A and moderate cues for proximity to RW, step length, and safety. Blocked practice with spouse turning to sit on chairs as pt has tendency to unsafely leave RW behind when turning. Pt only left RW behind one time, with improved safety with repetition. Standing balance/endurance with simulated clothing management task using clothes pins placed around waist, then reaching outside base of support to place on basketball net. Standing balance and trunk rotation, hand-eye coordination with standing ball toss with oblique twist. Pt with very stiff trunk so worked on twisting seated on mat using ball to tap cones. Much improved trunk rotation with isolation of movement. 3 sets of 10 standing bicep curls using 5 lb weighted bar. OT placed 2 inch step under R LE to decrease lean to R and promote midline with much improvement. Pt returned to room and left seated in wc with wife present and needs met.  Therapy Documentation Precautions:  Precautions Precautions: Fall Restrictions Weight Bearing Restrictions: No Pain:  Denies pain Agitated Behavior Scale: TBI Observation Details Observation Environment: CIR Start of observation period - Date: 01/19/22 Start of observation period - Time: 0818 End of observation period - Date: 01/19/22 End of observation period - Time: 0930 Agitated Behavior Scale (DO NOT LEAVE BLANKS) Short attention span, easy distractibility, inability to  concentrate: Present to a slight degree Impulsive, impatient, low tolerance for pain or frustration: Absent Uncooperative, resistant to care, demanding: Absent Violent and/or threatening violence toward people or property: Absent Explosive and/or unpredictable anger: Absent Rocking, rubbing, moaning, or other self-stimulating behavior: Absent Pulling at tubes, restraints, etc.: Absent Wandering from treatment areas: Absent Restlessness, pacing, excessive movement: Absent Repetitive behaviors, motor, and/or verbal: Absent Rapid, loud, or excessive talking: Absent Sudden changes of mood: Absent Easily initiated or excessive crying and/or laughter: Absent Self-abusiveness, physical and/or verbal: Absent Agitated behavior scale total score: 15    Therapy/Group: Individual Therapy  Valma Cava 01/19/2022, 3:31 PM

## 2022-01-19 NOTE — Progress Notes (Signed)
PROGRESS NOTE   Subjective/Complaints:  Doing well this AM, seen in therapies with wife observing, working on balance while on uneven surface.   No events overnight, no acute complaints. Wife endorses no questions, patient doing well.    ROS:  Denies fevers, chills, N/V, abdominal pain, constipation, diarrhea, SOB, cough, chest pain, new weakness or paraesthesias.     Objective:  Recent Labs    01/18/22 0857  HGB 12.6*  HCT 36.8*    No results for input(s): "NA", "K", "CL", "CO2", "GLUCOSE", "BUN", "CREATININE", "CALCIUM" in the last 72 hours.    Intake/Output Summary (Last 24 hours) at 01/19/2022 1236 Last data filed at 01/19/2022 0730 Gross per 24 hour  Intake 417 ml  Output --  Net 417 ml        Physical Exam: Vital Signs Blood pressure 122/76, pulse 75, temperature 97.9 F (36.6 C), temperature source Oral, resp. rate 18, height 6' 6.5" (1.994 m), weight 102.2 kg, SpO2 97 %.    General: awake, alert, appropriate,NAD HENT: conjugate gaze; oropharynx moist CV: regular rate; no JVD Pulmonary: CTA B/L; no W/R/R- good air movement GI: soft, NT, ND, (+)BS Psych: Pt's affect is appropriate. Pt is cooperative. Skin: crani incisions cdi, s/p staple removal.  Neuro: CN 2-12 grossly intact. Follows all commands. AAOx4. Some mild delayed processing but otherwise no apparent deficits.   Musculoskeletal: Moves all 4 extremities against resistance and gravity; keeps balance while performing BL UE ecercises on uneven surface, CGA to sit after activity.    Assessment/Plan: 1. Functional deficits which require 3+ hours per day of interdisciplinary therapy in a comprehensive inpatient rehab setting. Physiatrist is providing close team supervision and 24 hour management of active medical problems listed below. Physiatrist and rehab team continue to assess barriers to discharge/monitor patient progress toward functional and  medical goals  Care Tool:  Bathing    Body parts bathed by patient: Face, Front perineal area   Body parts bathed by helper: Right arm, Left arm, Chest, Abdomen, Buttocks, Right upper leg, Left upper leg, Right lower leg, Left lower leg     Bathing assist Assist Level: Maximal Assistance - Patient 24 - 49%     Upper Body Dressing/Undressing Upper body dressing   What is the patient wearing?: Pull over shirt    Upper body assist Assist Level: Maximal Assistance - Patient 25 - 49%    Lower Body Dressing/Undressing Lower body dressing      What is the patient wearing?: Pants, Incontinence brief     Lower body assist Assist for lower body dressing: 2 Helpers     Toileting Toileting    Toileting assist Assist for toileting: Total Assistance - Patient < 25%     Transfers Chair/bed transfer  Transfers assist  Chair/bed transfer activity did not occur: Safety/medical concerns  Chair/bed transfer assist level: Minimal Assistance - Patient > 75% Chair/bed transfer assistive device: Armrests, Programmer, multimedia   Ambulation assist      Assist level: Minimal Assistance - Patient > 75% Assistive device: Walker-rolling Max distance: 197f   Walk 10 feet activity   Assist     Assist level: Minimal Assistance - Patient >  75% Assistive device: Walker-rolling   Walk 50 feet activity   Assist Walk 50 feet with 2 turns activity did not occur: Safety/medical concerns  Assist level: Minimal Assistance - Patient > 75% Assistive device: Walker-rolling    Walk 150 feet activity   Assist Walk 150 feet activity did not occur: Safety/medical concerns         Walk 10 feet on uneven surface  activity   Assist Walk 10 feet on uneven surfaces activity did not occur: Safety/medical concerns         Wheelchair     Assist Is the patient using a wheelchair?: Yes Type of Wheelchair: Manual Wheelchair activity did not occur: Safety/medical  concerns  Wheelchair assist level: Minimal Assistance - Patient > 75% Max wheelchair distance: 150 ft    Wheelchair 50 feet with 2 turns activity    Assist    Wheelchair 50 feet with 2 turns activity did not occur: Safety/medical concerns   Assist Level: Supervision/Verbal cueing   Wheelchair 150 feet activity     Assist  Wheelchair 150 feet activity did not occur: Safety/medical concerns   Assist Level: Minimal Assistance - Patient > 75%   Blood pressure 122/76, pulse 75, temperature 97.9 F (36.6 C), temperature source Oral, resp. rate 18, height 6' 6.5" (1.994 m), weight 102.2 kg, SpO2 97 %.  Medical Problem List and Plan: 1. Functional deficits secondary to bilateral SDH's after fall s/p bilateral burr hole evacuation 8/14             -patient may shower             -ELOS/Goals: 12-14 days, supervision to mod I goals with PT, OT, SLP  Continue CIR- PT, OT and SLP   2.  Antithrombotics: -DVT/anticoagulation:  Mechanical:  Antiembolism stockings, knee (TED hose) Bilateral lower extremities Sequential compression devices, below knee Bilateral lower extremities             - CT head 8/24 stable -> Lovenox 40 IM mg daily for DVT prophy 8/25             -antiplatelet therapy: n/a  3. Pain Management/headache: ongoing             -Tylenol 1000 mg TID scheduled and DC hydrocodone 8/22 -> Fioricet 1 tab TID + Tylenol 500 mg BID PRN for HA, pain 8/25 -> Fioricet/tylenol PRN 8/28; restart scheduled as not getting PRNs despite ongoing HA 8/29            - Gabapentin 100 mg BID for persistent HA started 8/28 -> DC 8/29 d/t worsening cognition  9/2- denies pain today injection likely was extremely helpful 4. Mood/Behavior/Sleep - improved:              -pt seems fairly up beat and wife is very supportive             -antipsychotic agents: n/a             - 8/26 Altered sleep wake cycle- trazodone increase trazodone to '75mg'$  for sleep-> 100 mg 8/29                           -  Retimed to 1900 9/1  - improved sleep and nighttime attention per family                 5. Neuropsych/cognition: This patient is not quite capable of making decisions on his own behalf.            -  Given persistent slow processing and attention despite delirum workup, in setting of TBI, will trial Ritalin 5 mg BID 8/29 to improve arousal and participation -> reduce to 5 mg daily d/t nighttime hyperarrousal 8/31- improved  6. Skin/Wound Care: local care to scalp, removed staples 8/25    7. Fluids/Electrolytes/Nutrition: encourage appropriate po intake             -liberalized him to regular diet to help with po intake. Encouraged wife to bring food from home as well. Poor po intake so far however.   8. Seizure prophylaxis: keppra '500mg'$  q 12 hours -> DC 8/23 9. Hx of Prostate Cancer, c/b urinary incontinence - ongoing             -flomax 0.'4mg'$  bid, proscar '5mg'$  daily              -emptying with intermittent incontinence thus far              - UA 8/23 without s/s infection             - Discussed incontinence of bowel and bladder with family in detail 8/25; they feel he is now close to his baseline, which is intermittently incontinent of both                  10. HTN: well controlled             -hctz/lisinopril daily per home regimen; monitor for orthostasis  11. 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 Delirium,  likely multifactorial etiology (post-hospitalization, polypharmacy, TBI) - resolved - Ensure wearing hearing aides; wife has microphone available to amplify  - UA 8/23 stable; CBC 8/21-22 stable, no leukocytosis - Delirium precautions added and discussed with family - Keppra may be contributing, DC 8/23  - repeat CT head  - ordered 8/24 - stable - initiate sleep log 8/24 - reinforce 8/29 - DC PRN compazine and zofran, not using and has cog s/e   13.  Constipation/diarrhea- LBM 9/8  - PRN suppository 8/27, with results - Daily Miralax + 1x dose 8/29 -> PRN 9/4 - Sennakot-S 1 TAB QHS 8/28 -> BID 8/29 -> QHS 9/4 - PRN suppository 8/29 - +results - h/h 9/7 given large black BM  - stable  Hypokalemia:   -K+ 3.3 , KCL supplement -> 3.7 8/28-> 3.3 9/1 on 40 meq daily - repeat 9/5 3.9; continue on 20 MEQ daily and recheck 9/11  LOS: 20 days A FACE TO FACE EVALUATION WAS PERFORMED  Est Dispo: Tuesday, Sept 12th to home  with family assistance   Gertie Gowda, DO 01/19/2022

## 2022-01-19 NOTE — Progress Notes (Signed)
Patient ID: Brandon [name], male   DOB: [birth year], 76 y.o.   MRN: [id number]  SW met with pt wife to inform on DME ordered. She reports she has received a phone call from them. SW reminded her to follow-up with [facility] to make co-pay.   Loralee Pacas, MSW, [place] Office: [phone] Cell: [phone] Fax: [phone]

## 2022-01-20 NOTE — Progress Notes (Signed)
PROGRESS NOTE   Subjective/Complaints:  Doing well Eating lunch No questions or concerns Tolerated therapy today   ROS:  Denies fevers, chills, N/V, abdominal pain, constipation, diarrhea, SOB, cough, chest pain, new weakness or paraesthesias.     Objective:  Recent Labs    01/18/22 0857  HGB 12.6*  HCT 36.8*   No results for input(s): "NA", "K", "CL", "CO2", "GLUCOSE", "BUN", "CREATININE", "CALCIUM" in the last 72 hours.    Intake/Output Summary (Last 24 hours) at 01/20/2022 1947 Last data filed at 01/20/2022 1824 Gross per 24 hour  Intake 657 ml  Output --  Net 657 ml       Physical Exam: Vital Signs Blood pressure 110/64, pulse 72, temperature 98.4 F (36.9 C), temperature source Oral, resp. rate 18, height 6' 6.5" (1.994 m), weight 103.7 kg, SpO2 95 %.  Gen: no distress, normal appearing HEENT: oral mucosa pink and moist, NCAT Cardio: Reg rate Chest: normal effort, normal rate of breathing Abd: soft, non-distended Ext: no edema Psych: Pt's affect is appropriate. Pt is cooperative. Skin: crani incisions cdi, s/p staple removal.  Neuro: CN 2-12 grossly intact. Follows all commands. AAOx4. Some mild delayed processing but otherwise no apparent deficits.   Musculoskeletal: Moves all 4 extremities against resistance and gravity; keeps balance while performing BL UE ecercises on uneven surface, CGA to sit after activity.    Assessment/Plan: 1. Functional deficits which require 3+ hours per day of interdisciplinary therapy in a comprehensive inpatient rehab setting. Physiatrist is providing close team supervision and 24 hour management of active medical problems listed below. Physiatrist and rehab team continue to assess barriers to discharge/monitor patient progress toward functional and medical goals  Care Tool:  Bathing    Body parts bathed by patient: Left arm, Right arm, Chest, Abdomen, Front perineal  area, Buttocks, Right upper leg, Left upper leg, Right lower leg, Left lower leg, Face   Body parts bathed by helper: Right arm, Left arm, Chest, Abdomen, Buttocks, Right upper leg, Left upper leg, Right lower leg, Left lower leg     Bathing assist Assist Level: Minimal Assistance - Patient > 75%     Upper Body Dressing/Undressing Upper body dressing   What is the patient wearing?: Pull over shirt    Upper body assist Assist Level: Supervision/Verbal cueing    Lower Body Dressing/Undressing Lower body dressing      What is the patient wearing?: Pants, Underwear/pull up     Lower body assist Assist for lower body dressing: Minimal Assistance - Patient > 75%     Toileting Toileting    Toileting assist Assist for toileting: Minimal Assistance - Patient > 75%     Transfers Chair/bed transfer  Transfers assist  Chair/bed transfer activity did not occur: Safety/medical concerns  Chair/bed transfer assist level: Minimal Assistance - Patient > 75% Chair/bed transfer assistive device: Armrests, Programmer, multimedia   Ambulation assist      Assist level: Minimal Assistance - Patient > 75% Assistive device: Walker-rolling Max distance: 162f   Walk 10 feet activity   Assist     Assist level: Minimal Assistance - Patient > 75% Assistive device: [employer]  50 feet activity   Assist Walk 50 feet with 2 turns activity did not occur: Safety/medical concerns  Assist level: Minimal Assistance - Patient > 75% Assistive device: Walker-rolling    Walk 150 feet activity   Assist Walk 150 feet activity did not occur: Safety/medical concerns         Walk 10 feet on uneven surface  activity   Assist Walk 10 feet on uneven surfaces activity did not occur: Safety/medical concerns         Wheelchair     Assist Is the patient using a wheelchair?: Yes Type of Wheelchair: Manual Wheelchair activity did not occur: Safety/medical  concerns  Wheelchair assist level: Minimal Assistance - Patient > 75% Max wheelchair distance: 150 ft    Wheelchair 50 feet with 2 turns activity    Assist    Wheelchair 50 feet with 2 turns activity did not occur: Safety/medical concerns   Assist Level: Supervision/Verbal cueing   Wheelchair 150 feet activity     Assist  Wheelchair 150 feet activity did not occur: Safety/medical concerns   Assist Level: Minimal Assistance - Patient > 75%   Blood pressure 110/64, pulse 72, temperature 98.4 F (36.9 C), temperature source Oral, resp. rate 18, height 6' 6.5" (1.994 m), weight 103.7 kg, SpO2 95 %.  Medical Problem List and Plan: 1. Functional deficits secondary to bilateral SDH's after fall s/p bilateral burr hole evacuation 8/14             -patient may shower             -ELOS/Goals: 12-14 days, supervision to mod I goals with PT, OT, SLP  Continue CIR- PT, OT and SLP 2.  Antithrombotics: -DVT/anticoagulation:  Mechanical:  Antiembolism stockings, knee (TED hose) Bilateral lower extremities Sequential compression devices, below knee Bilateral lower extremities             - CT head 8/24 stable -> Lovenox 40 IM mg daily for DVT prophy 8/25             -antiplatelet therapy: n/a  3. Pain Management/headache: ongoing             -Tylenol 1000 mg TID scheduled and DC hydrocodone 8/22 -> Fioricet 1 tab TID + Tylenol 500 mg BID PRN for HA, pain 8/25 -> Fioricet/tylenol PRN 8/28; restart scheduled as not getting PRNs despite ongoing HA 8/29            - Gabapentin 100 mg BID for persistent HA started 8/28 -> DC 8/29 d/t worsening cognition  9/2- denies pain today injection likely was extremely helpful 4. Mood/Behavior/Sleep - improved:              -pt seems fairly up beat and wife is very supportive             -antipsychotic agents: n/a             - 8/26 Altered sleep wake cycle- trazodone increase trazodone to '75mg'$  for sleep-> 100 mg 8/29                           -  Retimed to 1900 9/1  - improved sleep and nighttime attention per family                 5. Neuropsych/cognition: This patient is not quite capable of making decisions on his own behalf.            -  Given persistent slow processing and attention despite delirum workup, in setting of TBI, will trial Ritalin 5 mg BID 8/29 to improve arousal and participation -> reduce to 5 mg daily d/t nighttime hyperarrousal 8/31- improved  6. Skin/Wound Care: local care to scalp, removed staples 8/25    7. Fluids/Electrolytes/Nutrition: encourage appropriate po intake             -liberalized him to regular diet to help with po intake. Encouraged wife to bring food from home as well. Poor po intake so far however.   8. Seizure prophylaxis: keppra '500mg'$  q 12 hours -> DC 8/23 9. Hx of Prostate Cancer, c/b urinary incontinence - ongoing             -flomax 0.'4mg'$  bid, proscar '5mg'$  daily              -emptying with intermittent incontinence thus far              - UA 8/23 without s/s infection             - Discussed incontinence of bowel and bladder with family in detail 8/25; they feel he is now close to his baseline, which is intermittently incontinent of both                  10. HTN: well controlled             -hctz/lisinopril daily per home regimen; monitor for orthostasis  11. Osteoarthritis bilateral knees, R>L; radiographic findings indicative of CPPD 8/31               -continue voltaren gel to both knees - increased 8/25               - Discussing RLE bracing with PT for support and sensory feedback 8/25             -his knee pain certainly was contributing to his recent falls and balance deficits in the months prior to this admit             - voltaren gel to b/l knees 4 g 8/25            - xray R knee 8/31 showing signs of CPPD; discussed with family need for OP workup including joint aspiration             - s/p R knee steroid injection 9/1, with significant pain and gait improvement  12.  Delirium, likely multifactorial etiology (post-hospitalization, polypharmacy, TBI) - resolved - Ensure wearing hearing aides; wife has microphone available to amplify  - UA 8/23 stable; CBC 8/21-22 stable, no leukocytosis - Delirium precautions added and discussed with family - Keppra may be contributing, DC 8/23  - repeat CT head  - ordered 8/24 - stable - initiate sleep log 8/24 - reinforce 8/29 - DC PRN compazine and zofran, not using and has cog s/e   13.  Constipation/diarrhea- LBM 9/8  - PRN suppository 8/27, with results - Daily Miralax + 1x dose 8/29 -> PRN 9/4 - continue Sennakot-S 1 TAB QHS 8/28 -> BID 8/29 -> QHS 9/4 - PRN suppository 8/29 - +results - h/h 9/7 given large black BM  - stable  Hypokalemia:   -K+ 3.3 , KCL supplement -> 3.7 8/28-> 3.3 9/1 on 40 meq daily - repeat 9/5 3.9; continue on 20 MEQ daily and recheck 9/11  LOS: 21 days A FACE TO FACE EVALUATION WAS PERFORMED  Est Dispo: Tuesday, Sept 12th  to home with family assistance   Izora Ribas, MD 01/20/2022

## 2022-01-20 NOTE — Progress Notes (Signed)
Speech Language Pathology TBI Note  Patient Details  Name: NAYEF COLLEGE MRN: [id number] Date of Birth: 1945/05/21  Today's Date: 01/20/2022 SLP Individual Time: 0930-1030 SLP Individual Time Calculation (min): 60 min  Short Term Goals: Week 3: SLP Short Term Goal 1 (Week 3): Pt will sustain his attention to basic, familiar tasks for 10-12 minute intervals wtih mod cues for redirection. SLP Short Term Goal 2 (Week 3): Pt will utilize external aids to recall orientation information with min verbal and visual cues. SLP Short Term Goal 3 (Week 3): Patient will demonstrate functional problem solving with basic and familiar tasks with Mod verbal cues. SLP Short Term Goal 4 (Week 3): Patient will verbally express his basic wants/needs at the phrase level with Mod A multimodal cues. SLP Short Term Goal 5 (Week 3): Patient will self-monitor and correct errors at the word level during a structured language task with Min A multimodal cues.  Skilled Therapeutic Interventions: Skilled treatment session focused on cognitive-linguistic goals. Upon arrival, patient was awake in bed and agreeable to treatment session. SLP facilitated session by providing overall Mod A verbal cues for functional problem solving and overall safety with transfer from the bed to the wheelchair. Patient was re-administered the [facility] Mental Status Examination (SLUMS). Patient scored  20/30 points with a score of 27 or above considered normal. Patient's overall score has improved by 10 points since initial evaluation on 01/02/22, however, patient continues to demonstrate deficits in problem solving, recall, and attention. Also suspect patient's hearing also impacted his function today despite having bilateral hearing aids in place and with SLP utilizing the microphone.  Patient also participated in a verbal description task in which he as overall Mod I for word-finding. Patient transferred back to bed at end of session and  left with alarm on and all needs within reach. Continue with current plan of care.       Pain Pain Assessment Pain Scale: 0-10 Pain Score: 0-No pain  Agitated Behavior Scale: TBI  ABS discontinued d/t ABS score less than 20 for the last three days or no behaviors present   Therapy/Group: Individual Therapy  Jevon Littlepage 01/20/2022, 12:48 PM

## 2022-01-20 NOTE — Progress Notes (Shared)
Occupational Therapy Discharge Summary  Patient Details  Name: Brandon [name] MRN: [id number] Date of Birth: [birth year]  Date of Discharge from OT service:{Time; dates multiple:304500300}  {CHL IP REHAB OT TIME CALCULATIONS:304400400}   Patient has met 5 of 5 long term goals due to improved activity tolerance, improved balance, postural control, ability to compensate for deficits, functional use of  RIGHT upper, RIGHT lower, LEFT upper, and LEFT lower extremity, improved attention, improved awareness, and improved coordination.  Patient to discharge at [facility] Behavioral Assist level.  Patient's care partner is independent to provide the necessary physical and cognitive assistance at discharge.    Reasons goals not met: n/a  Recommendation:  Patient will benefit from ongoing skilled OT services in home health setting to continue to advance functional skills in the area of BADL and Reduce care partner burden.  Equipment: Wheelchair, tub transfer bench (family will order separately and they already have a 3-in-1 BSC they will borrow from a family member)  Reasons for discharge: treatment goals met and discharge from hospital  Patient/family agrees with progress made and goals achieved: Yes  OT Discharge Precautions/Restrictions  Precautions Precautions: Fall Restrictions Weight Bearing Restrictions: No Pain   ADL ADL Eating: Supervision/safety, Set up Where Assessed-Eating: Chair Grooming: Supervision/safety, Setup Where Assessed-Grooming: Chair Upper Body Bathing: Supervision/safety Where Assessed-Upper Body Bathing: Shower Lower Body Bathing: Minimal assistance Where Assessed-Lower Body Bathing: Shower Upper Body Dressing: Supervision/safety Where Assessed-Upper Body Dressing: Edge of bed Lower Body Dressing: Minimal assistance Where Assessed-Lower Body Dressing: Bed level Toileting: Minimal assistance Where Assessed-Toileting: Bedside Commode Toilet Transfer: Minimal  assistance Toilet Transfer Method: Other (comment) (stedy) Science writer: Radiographer, therapeutic: Environmental education officer: Dependent (Pt able to perform squat-pivot transfer to the L with maximal assistance x 2 and then able to perform sit <> stand in [place] with minimal assistance to then transport back from tub-bench > EOB.) Social research officer, government Method: Education officer, environmental: Transfer tub bench ADL Comments: Pt requires maximal verbal and tactile prompts to complete ADLs. Pt demo's increased difficulty with following instructions due to hearing deficits and impaired processing. Pt demo's after after ADL routine and requesting to remain lying in bed. Vision   Perception  Perception: Impaired Spatial Orientation: Impaired body awareness when seated and standing. Still has lateral lean to the R but improved from eval Praxis Praxis: Impaired Praxis Impairment Details: Motor planning;Initiation Praxis-Other Comments: Improved from eval Cognition Cognition Overall Cognitive Status: Impaired/Different from baseline Arousal/Alertness: Awake/alert Orientation Level: Place;Person Memory: Impaired Memory Impairment: Storage deficit;Retrieval deficit Attention: Sustained Awareness: Impaired Behaviors: Impulsive Safety/Judgment: Impaired Brief Interview for Mental Status (BIMS) Repetition of Three Words (First Attempt): 3 Temporal Orientation: Year: Correct Temporal Orientation: Month: Accurate within 5 days Temporal Orientation: Day: Incorrect Recall: "Sock": No, could not recall Recall: "Blue": Yes, after cueing ("a color") Recall: "Bed": No, could not recall BIMS Summary Score: 9 Sensation Sensation Light Touch: Appears Intact Proprioception Impaired Details:  (generalized proprioception/body awarenss) Coordination Gross Motor Movements are Fluid and Coordinated: No Fine Motor Movements are Fluid and Coordinated:  No Coordination and Movement Description: decreased smoothness and coordination with movements, improved since eval Motor    Mobility  Bed Mobility Bed Mobility:  (Pt sitting in recliner upon therapy arrival) Supine to Sit: Contact Guard/Touching assist Sit to Supine: Contact Guard/Touching assist Transfers Sit to Stand: Minimal Assistance - Patient > 75% Stand to Sit: Minimal Assistance - Patient > 75%  Trunk/Postural Assessment  Cervical Assessment Cervical Assessment:  (forward  head) Thoracic Assessment Thoracic Assessment:  (rounded shoulders) Lumbar Assessment Lumbar Assessment:  (anterior pelvic tilt)  Balance Balance Balance Assessed: Yes Dynamic Sitting Balance Sitting balance - Comments: Min A due to posterior and lateral lean. Static Standing Balance Static Standing - Balance Support: Bilateral upper extremity supported Static Standing - Level of Assistance: 4: Min assist Extremity/Trunk Assessment RUE Assessment RUE Assessment: Within Functional Limits General Strength Comments: 4/5 generalized weakness, improved since eval LUE Assessment LUE Assessment: Within Functional Limits Active Range of Motion (AROM) Comments: WFL in all ranges. Shoulder, wrist, elbow in all ranges. General Strength Comments: 4/5 generalized weakness   Valma Cava 01/20/2022, 3:24 PM

## 2022-01-20 NOTE — Progress Notes (Signed)
Occupational Therapy TBI Note  Patient Details  Name: Brandon [name] MRN: [id number] Date of Birth: August 06, 1945  Today's Date: 01/20/2022 OT Individual Time: 6073-7106 OT Individual Time Calculation (min): 55 min    Short Term Goals: Week 3:  OT Short Term Goal 1 (Week 3): LTG=STG 2/2 ELOS  Skilled Therapeutic Interventions/Progress Updates:    Pt greeted semi-reclined in bed with wife Perrin Smack present. OT treatment session focused on family education with self-care tasks. OT had patient wife Perrin Smack do functional ambulation, transfers, and all BADL tasks with patient. Pt seemingly more shaky and anxious today. Pt  needed min A for sit<>stands and ambulation w/ RW, but needed mod to max cues for RW positioning with turns. Pt gets easily confused with turns, often trying to leave the walker behind. Pt with a few LOB's to the R, but Kitty did a good job with the gait belt correcting and redirecting pt. Overall min A for bathing tasks. Pt ambulated out of bathroom w/ RW and min A, then got dressed at EOB. Pt loosing balance backwards and to the R requiring min/mod A and multimodal cues to achieve anterior weight shift. Educated on adapted strategies and encouraged Kitty to left pt do as much as he can on his own safely. Min A to thread pants and pull them up today. OT educated on adapted strategy for donning TED hose. Pt ambulated to the recliner and left sitting up in recliner with alarm belt on, wife present, and needs met.   Therapy Documentation Precautions:  Precautions Precautions: Fall Restrictions Weight Bearing Restrictions: No Pain: Pain Assessment Pain Scale: 0-10 Pain Score: 0-No pain Agitated Behavior Scale: TBI Observation Details Observation Environment: CIR Start of observation period - Date: 01/20/22 Start of observation period - Time: 1037 End of observation period - Date: 01/20/22 End of observation period - Time: 1132 Agitated Behavior Scale (DO NOT LEAVE BLANKS) Short  attention span, easy distractibility, inability to concentrate: Present to a slight degree Impulsive, impatient, low tolerance for pain or frustration: Absent Uncooperative, resistant to care, demanding: Absent Violent and/or threatening violence toward people or property: Absent Explosive and/or unpredictable anger: Absent Rocking, rubbing, moaning, or other self-stimulating behavior: Absent Pulling at tubes, restraints, etc.: Absent Wandering from treatment areas: Absent Restlessness, pacing, excessive movement: Present to a slight degree Repetitive behaviors, motor, and/or verbal: Absent Rapid, loud, or excessive talking: Absent Sudden changes of mood: Absent Easily initiated or excessive crying and/or laughter: Absent Self-abusiveness, physical and/or verbal: Absent Agitated behavior scale total score: 16  Therapy/Group: Individual Therapy  Valma Cava 01/20/2022, 12:51 PM

## 2022-01-21 NOTE — Progress Notes (Signed)
PROGRESS NOTE   No new complaints this morning Patient's chart reviewed- No issues reported overnight Vitals signs stable    ROS:  Denies fevers, chills, N/V, abdominal pain, constipation, diarrhea, SOB, cough, chest pain, new weakness or paraesthesias.     Objective:  No results for input(s): "WBC", "HGB", "HCT", "PLT" in the last 72 hours.  No results for input(s): "NA", "K", "CL", "CO2", "GLUCOSE", "BUN", "CREATININE", "CALCIUM" in the last 72 hours.    Intake/Output Summary (Last 24 hours) at 01/21/2022 1741 Last data filed at 01/21/2022 1417 Gross per 24 hour  Intake 780 ml  Output --  Net 780 ml       Physical Exam: Vital Signs Blood pressure 116/61, pulse 79, temperature 97.9 F (36.6 C), resp. rate 19, height 6' 6.5" (1.994 m), weight 103.7 kg, SpO2 97 %. Gen: no distress, normal appearing HEENT: oral mucosa pink and moist, NCAT Cardio: Reg rate Chest: normal effort, normal rate of breathing Abd: soft, non-distended Ext: no edema Psych: Pt's affect is appropriate. Pt is cooperative. Skin: crani incisions cdi, s/p staple removal.  Neuro: CN 2-12 grossly intact. Follows all commands. AAOx4. Some mild delayed processing but otherwise no apparent deficits.   Musculoskeletal: Moves all 4 extremities against resistance and gravity; keeps balance while performing BL UE ecercises on uneven surface, CGA to sit after activity.    Assessment/Plan: 1. Functional deficits which require 3+ hours per day of interdisciplinary therapy in a comprehensive inpatient rehab setting. Physiatrist is providing close team supervision and 24 hour management of active medical problems listed below. Physiatrist and rehab team continue to assess barriers to discharge/monitor patient progress toward functional and medical goals  Care Tool:  Bathing    Body parts bathed by patient: Left arm, Right arm, Chest, Abdomen, Front perineal  area, Buttocks, Right upper leg, Left upper leg, Right lower leg, Left lower leg, Face   Body parts bathed by helper: Right arm, Left arm, Chest, Abdomen, Buttocks, Right upper leg, Left upper leg, Right lower leg, Left lower leg     Bathing assist Assist Level: Minimal Assistance - Patient > 75%     Upper Body Dressing/Undressing Upper body dressing   What is the patient wearing?: Pull over shirt    Upper body assist Assist Level: Supervision/Verbal cueing    Lower Body Dressing/Undressing Lower body dressing      What is the patient wearing?: Pants, Underwear/pull up     Lower body assist Assist for lower body dressing: Minimal Assistance - Patient > 75%     Toileting Toileting    Toileting assist Assist for toileting: Minimal Assistance - Patient > 75%     Transfers Chair/bed transfer  Transfers assist  Chair/bed transfer activity did not occur: Safety/medical concerns  Chair/bed transfer assist level: Minimal Assistance - Patient > 75% Chair/bed transfer assistive device: Armrests, Programmer, multimedia   Ambulation assist      Assist level: Minimal Assistance - Patient > 75% Assistive device: Walker-rolling Max distance: 138f   Walk 10 feet activity   Assist     Assist level: Minimal Assistance - Patient > 75% Assistive device: Walker-rolling   Walk 50 feet  activity   Assist Walk 50 feet with 2 turns activity did not occur: Safety/medical concerns  Assist level: Minimal Assistance - Patient > 75% Assistive device: Walker-rolling    Walk 150 feet activity   Assist Walk 150 feet activity did not occur: Safety/medical concerns         Walk 10 feet on uneven surface  activity   Assist Walk 10 feet on uneven surfaces activity did not occur: Safety/medical concerns         Wheelchair     Assist Is the patient using a wheelchair?: Yes Type of Wheelchair: Manual Wheelchair activity did not occur: Safety/medical  concerns  Wheelchair assist level: Minimal Assistance - Patient > 75% Max wheelchair distance: 150 ft    Wheelchair 50 feet with 2 turns activity    Assist    Wheelchair 50 feet with 2 turns activity did not occur: Safety/medical concerns   Assist Level: Supervision/Verbal cueing   Wheelchair 150 feet activity     Assist  Wheelchair 150 feet activity did not occur: Safety/medical concerns   Assist Level: Minimal Assistance - Patient > 75%   Blood pressure 116/61, pulse 79, temperature 97.9 F (36.6 C), resp. rate 19, height 6' 6.5" (1.994 m), weight 103.7 kg, SpO2 97 %.  Medical Problem List and Plan: 1. Functional deficits secondary to bilateral SDH's after fall s/p bilateral burr hole evacuation 8/14             -patient may shower             -ELOS/Goals: 12-14 days, supervision to mod I goals with PT, OT, SLP  Continue CIR- PT, OT and SLP 2.  Antithrombotics: -DVT/anticoagulation:  Mechanical:  Antiembolism stockings, knee (TED hose) Bilateral lower extremities Sequential compression devices, below knee Bilateral lower extremities             - CT head 8/24 stable -> Lovenox 40 IM mg daily for DVT prophy 8/25             -antiplatelet therapy: n/a  3. Pain Management/headache: ongoing             -continue Tylenol 1000 mg TID scheduled and DC hydrocodone 8/22 -> Fioricet 1 tab TID + Tylenol 500 mg BID PRN for HA, pain 8/25 -> Fioricet/tylenol PRN 8/28; restart scheduled as not getting PRNs despite ongoing HA 8/29            - Gabapentin 100 mg BID for persistent HA started 8/28 -> DC 8/29 d/t worsening cognition  9/2- denies pain today injection likely was extremely helpful 4. Mood/Behavior/Sleep - improved:              -pt seems fairly up beat and wife is very supportive             -antipsychotic agents: n/a             - 8/26 Altered sleep wake cycle- trazodone increase trazodone to '75mg'$  for sleep-> 100 mg 8/29                           - Retimed to 1900 9/1   - improved sleep and nighttime attention per family                 5. Neuropsych/cognition: This patient is not quite capable of making decisions on his own behalf.            - Given persistent slow  processing and attention despite delirum workup, in setting of TBI, will trial Ritalin 5 mg BID 8/29 to improve arousal and participation -> reduce to 5 mg daily d/t nighttime hyperarrousal 8/31- improved  6. Skin/Wound Care: local care to scalp, removed staples 8/25    7. Fluids/Electrolytes/Nutrition: encourage appropriate po intake             -liberalized him to regular diet to help with po intake. Encouraged wife to bring food from home as well. Poor po intake so far however.   8. Seizure prophylaxis: keppra '500mg'$  q 12 hours -> DC 8/23 9. Hx of Prostate Cancer, c/b urinary incontinence - ongoing             -continue flomax 0.'4mg'$  bid, proscar '5mg'$  daily              -emptying with intermittent incontinence thus far              - UA 8/23 without s/s infection             - Discussed incontinence of bowel and bladder with family in detail 8/25; they feel he is now close to his baseline, which is intermittently incontinent of both                  10. HTN: well controlled             -continue hctz/lisinopril daily per home regimen; monitor for orthostasis  11. Osteoarthritis bilateral knees, R>L; radiographic findings indicative of CPPD 8/31               -continue voltaren gel to both knees - increased 8/25               - Discussing RLE bracing with PT for support and sensory feedback 8/25             -his knee pain certainly was contributing to his recent falls and balance deficits in the months prior to this admit             - voltaren gel to b/l knees 4 g 8/25            - xray R knee 8/31 showing signs of CPPD; discussed with family need for OP workup including joint aspiration             - s/p R knee steroid injection 9/1, with significant pain and gait improvement  12.  Delirium, likely multifactorial etiology (post-hospitalization, polypharmacy, TBI) - resolved - Ensure wearing hearing aides; wife has microphone available to amplify  - UA 8/23 stable; CBC 8/21-22 stable, no leukocytosis - Delirium precautions added and discussed with family - Keppra may be contributing, DC 8/23  - repeat CT head  - ordered 8/24 - stable - initiate sleep log 8/24 - reinforce 8/29 - DC PRN compazine and zofran, not using and has cog s/e   13.  Constipation/diarrhea- LBM 9/8  - PRN suppository 8/27, with results - Daily Miralax + 1x dose 8/29 -> PRN 9/4 - continue Sennakot-S 1 TAB QHS 8/28 -> BID 8/29 -> QHS 9/4 - PRN suppository 8/29 - +results - h/h 9/7 given large black BM  - stable  Hypokalemia:   -K+ 3.3 , KCL supplement -> 3.7 8/28-> 3.3 9/1 on 40 meq daily - repeat 9/5 3.9; continue on 20 MEQ daily and recheck 9/11  LOS: 22 days A FACE TO FACE EVALUATION WAS PERFORMED  Est Dispo: Tuesday, Sept 12th to  home with family assistance   Izora Ribas, MD 01/21/2022

## 2022-01-22 ENCOUNTER — Other Ambulatory Visit (HOSPITAL_COMMUNITY): Payer: Self-pay

## 2022-01-22 LAB — BASIC METABOLIC PANEL
Anion gap: 7 (ref 5–15)
BUN: 20 mg/dL (ref 8–23)
CO2: 28 mmol/L (ref 22–32)
Calcium: 9.1 mg/dL (ref 8.9–10.3)
Chloride: 106 mmol/L (ref 98–111)
Creatinine, Ser: 1.22 mg/dL (ref 0.61–1.24)
GFR, Estimated: >60 mL/min (ref >60)
Glucose, Bld: 175 mg/dL — ABNORMAL HIGH (ref 70–99)
Potassium: 3.9 mmol/L (ref 3.5–5.1)
Sodium: 141 mmol/L (ref 135–145)

## 2022-01-22 MED ORDER — PANTOPRAZOLE SODIUM 40 MG PO TBEC
40.0000 mg | DELAYED_RELEASE_TABLET | Freq: Every day | ORAL | 0 refills | Status: DC
Start: 2022-01-22 — End: 2022-03-14
  Filled 2022-01-22: qty 30, 30d supply, fill #0

## 2022-01-22 MED ORDER — TRAZODONE HCL 100 MG PO TABS
100.0000 mg | ORAL_TABLET | Freq: Every day | ORAL | 0 refills | Status: DC
  Filled 2022-01-22: qty 30, 30d supply, fill #0

## 2022-01-22 MED ORDER — CHOLECALCIFEROL 25 MCG (1000 UT) PO TABS
1000.0000 [IU] | ORAL_TABLET | Freq: Every day | ORAL | 0 refills | Status: AC
Start: 2022-01-22 — End: ?
  Filled 2022-01-22: qty 30, 30d supply, fill #0

## 2022-01-22 MED ORDER — DICLOFENAC SODIUM 1 % EX GEL
4.0000 g | Freq: Three times a day (TID) | CUTANEOUS | 0 refills | Status: AC
  Filled 2022-01-22: qty 100, 30d supply, fill #0

## 2022-01-22 MED ORDER — BUTALBITAL-APAP-CAFFEINE 50-325-40 MG PO TABS
1.0000 | ORAL_TABLET | Freq: Four times a day (QID) | ORAL | 0 refills | Status: DC | PRN
  Filled 2022-01-22: qty 10, 3d supply, fill #0

## 2022-01-22 MED ORDER — ATORVASTATIN CALCIUM 20 MG PO TABS
20.0000 mg | ORAL_TABLET | Freq: Every day | ORAL | 0 refills | Status: DC
  Filled 2022-01-22: qty 30, 30d supply, fill #0

## 2022-01-22 MED ORDER — TAMSULOSIN HCL 0.4 MG PO CAPS
0.4000 mg | ORAL_CAPSULE | Freq: Two times a day (BID) | ORAL | 0 refills | Status: DC
  Filled 2022-01-22: qty 60, 30d supply, fill #0

## 2022-01-22 MED ORDER — METHYLPHENIDATE HCL 5 MG PO TABS
5.0000 mg | ORAL_TABLET | Freq: Every day | ORAL | 0 refills | Status: DC
  Filled 2022-01-22: qty 30, 30d supply, fill #0

## 2022-01-22 MED ORDER — FINASTERIDE 5 MG PO TABS
5.0000 mg | ORAL_TABLET | Freq: Every day | ORAL | 0 refills | Status: DC
  Filled 2022-01-22: qty 30, 30d supply, fill #0

## 2022-01-22 MED ORDER — LISINOPRIL-HYDROCHLOROTHIAZIDE 20-25 MG PO TABS
1.0000 | ORAL_TABLET | Freq: Every day | ORAL | 0 refills | Status: DC
  Filled 2022-01-22: qty 30, 30d supply, fill #0

## 2022-01-22 MED ORDER — POTASSIUM CHLORIDE CRYS ER 20 MEQ PO TBCR
20.0000 meq | EXTENDED_RELEASE_TABLET | Freq: Every day | ORAL | 0 refills | Status: DC
Start: 2022-01-22 — End: 2022-01-23
  Filled 2022-01-22: qty 30, 30d supply, fill #0

## 2022-01-22 MED ORDER — VITAMIN B COMPLEX PO TABS
ORAL_TABLET | Freq: Every day | ORAL | 0 refills | Status: DC
  Filled 2022-01-22: qty 30, 30d supply, fill #0

## 2022-01-22 MED ORDER — ACETAMINOPHEN 500 MG PO TABS: 500.0000 mg | ORAL_TABLET | Freq: Two times a day (BID) | ORAL | 0 refills | Status: DC | PRN

## 2022-01-22 NOTE — Progress Notes (Signed)
Occupational Therapy TBI Note  Patient Details  Name: Brandon [name] MRN: [id number] Date of Birth: [birth year]  Today's Date: 01/22/2022 OT Individual Time: 1000-1100 Total time: 60 minutes      Short Term Goals: Week 3:  OT Short Term Goal 1 (Week 3): LTG=STG 2/2 ELOS  Skilled Therapeutic Interventions/Progress Updates:     Patient participated in skilled OT session focusing on ADL re-training and functional transfers in order to prepare for planned discharge home with family assist tomorrow. Therapist facilitated session with son observing and/or assisting patient to complete bathing and dressing in order to promote improved independence and functional performance during ADL tasks when at home with family assisting. Therapist provided close supervision during bathing while pt sat in shower using tub bench and LHS. Pt continues to demonstrate quick movements/impulsive-like behaviors at times which causes him to be a higher risk for falls. Pt provided with VC for safety awareness when performing functional transfers and all self care tasks. Pt did demonstrate more self control  and safety awareness with education provided.   Min Assist provided for LB dressing; primarily when standing. UB dressing completed at SBA seated.   Son provided photo of modifications made to pt's bathroom. Removed doors for walk-in shower to allow for tub bench to fit. A curtain will be added in replace of the door.   Therapy Documentation Precautions:  Precautions Precautions: Fall Precaution Comments: Intermittent R lean with mobility Restrictions Weight Bearing Restrictions: No  Agitated Behavior Scale: TBI Observation Details Observation Environment: pt's room Start of observation period - Date: 01/22/22 Start of observation period - Time: 1000 End of observation period - Date: 01/22/22 End of observation period - Time: 1100 Agitated Behavior Scale (DO NOT LEAVE BLANKS) Short attention span, easy  distractibility, inability to concentrate: Present to a slight degree Impulsive, impatient, low tolerance for pain or frustration: Absent Uncooperative, resistant to care, demanding: Absent Violent and/or threatening violence toward people or property: Absent Explosive and/or unpredictable anger: Absent Rocking, rubbing, moaning, or other self-stimulating behavior: Absent Pulling at tubes, restraints, etc.: Absent Wandering from treatment areas: Absent Restlessness, pacing, excessive movement: Present to a slight degree Repetitive behaviors, motor, and/or verbal: Absent Rapid, loud, or excessive talking: Absent Sudden changes of mood: Absent Easily initiated or excessive crying and/or laughter: Absent Self-abusiveness, physical and/or verbal: Absent Agitated behavior scale total score: 16    Therapy/Group: Individual Therapy Ailene Ravel, OTR/L,CBIS  Supplemental OT - [facility]  01/22/2022, 12:50 PM

## 2022-01-22 NOTE — Progress Notes (Deleted)
Inpatient Rehabilitation Discharge Medication Review by a Pharmacist  A complete drug regimen review was completed for this patient to identify any potential clinically significant medication issues.  High Risk Drug Classes Is patient taking? Indication by Medication  Antipsychotic No   Anticoagulant No   Antibiotic No   Opioid No   Antiplatelet No   Hypoglycemics/insulin No   Vasoactive Medication Yes Zestril - HTN Flomax, proscar- BPH  Chemotherapy No   Other Yes Protonix - GERD  Trazodone - sleep  Lipitor - HLD  Ritalin - attention  Fioricet - headache Acetaminophen - pain      Type of Medication Issue Identified Description of Issue Recommendation(s)  Drug Interaction(s) (clinically significant)     Duplicate Therapy     Allergy     No Medication Administration End Date     Incorrect Dose     Additional Drug Therapy Needed     Significant med changes from prior encounter (inform family/care partners about these prior to discharge). Start Fioricet, protonix, vitamin B, potassium chloride, ritalin Educate Patient   Other       Clinically significant medication issues were identified that warrant physician communication and completion of prescribed/recommended actions by midnight of the next day:  No  Name of provider notified for urgent issues identified:   Provider Method of Notification:     Pharmacist comments:   Time spent performing this drug regimen review (minutes):  30   Brandon [name] 01/22/2022 12:40 PM

## 2022-01-22 NOTE — Progress Notes (Addendum)
Patient ID: Brandon [name], male   DOB: 01/15/1946, 76 y.o.   MRN: [id number]  SW met with pt wife and son in room to discuss HHA preference, and paying for DME with [employer]. No HHA preference. SW provided contact number to his wife for her to follow-up about paying for DME so items can be delivered to room today.   SW sent HHPT/OT/SLP/aide referral to [facility] and waiting on follow-up.  *referral accepted.   Auria Chamberlain, MSW, LCSWA Office: [phone] Cell: [phone] Fax: [phone]  

## 2022-01-22 NOTE — Progress Notes (Signed)
Speech Language Pathology Discharge Summary  Patient Details  Name: Brandon [name] MRN: [id number] Date of Birth: 11-27-1945  Date of Discharge from SLP service:January 22, 2022  Today's Date: 01/22/2022 SLP Individual Time: 1330-1415 SLP Individual Time Calculation (min): 45 min   Skilled Therapeutic Interventions:  Skilled treatment session focused on cognitive goals. SLP facilitated session by providing Min verba cues for safety due to impulsivity with transfer from the bed to the wheelchair. Patient required extra time and Mod verbal and question cues for functional problem solving and organization during a mildly complex scheduling task. Patient demonstrated sustained attention to task for ~30 minutes with overall Mod I. Patient was transferred back to bed at end of session and left with alarm on and all needs within reach.   Patient has met 6 of 6 long term goals.  Patient to discharge at [facility] level.   Reasons goals not met: N/A   Clinical Impression/Discharge Summary: Patient has made functional gains and has met 6 of 6 LTGs this admission. Currently, patient demonstrates behaviors consistent with a [place]o Level VII and requires overall Min A multimodal cues to complete functional and familiar tasks safely in regards to problem solving, selective attention, emergent awareness and recall with use of compensatory strategies. Patient also demonstrates intermittent deficits in word-finding at the sentence level, especially when fatigued. Patient and family education is complete and patient will discharge home with 24 hour supervision from family. Patient would benefit from f/u SLP services to maximize his cognitive-linguistic functioning and overall functional independence in order to reduce caregiver burden.   Care Partner:  Caregiver Able to Provide Assistance: Yes  Type of Caregiver Assistance: Physical;Cognitive  Recommendation:  Home Health SLP;24 hour supervision/assistance   Rationale for SLP Follow Up: Reduce caregiver burden;Maximize cognitive function and independence   Equipment: N/A   Reasons for discharge: Discharged from hospital;Treatment goals met   Patient/Family Agrees with Progress Made and Goals Achieved: Yes    Tonishia Steffy, [place] 01/22/2022, 7:23 AM

## 2022-01-22 NOTE — Progress Notes (Addendum)
Inpatient Rehabilitation Discharge Medication Review by a Pharmacist  A complete drug regimen review was completed for this patient to identify any potential clinically significant medication issues.  High Risk Drug Classes Is patient taking? Indication by Medication  Antipsychotic No   Anticoagulant No   Antibiotic No   Opioid No   Antiplatelet No   Hypoglycemics/insulin No   Vasoactive Medication Yes Zestril - HTN Flomax, proscar- BPH  Chemotherapy No   Other Yes Protonix - GERD  Trazodone - sleep  Lipitor - HLD  Ritalin - attention  Fioricet - headache Acetaminophen - pain      Type of Medication Issue Identified Description of Issue Recommendation(s)  Drug Interaction(s) (clinically significant)     Duplicate Therapy     Allergy     No Medication Administration End Date     Incorrect Dose     Additional Drug Therapy Needed     Significant med changes from prior encounter (inform family/care partners about these prior to discharge). Start Fioricet, protonix, potassium chloride, ritalin, trazodone, diclofenac Team to educate patient prior to discharge    Other       Clinically significant medication issues were identified that warrant physician communication and completion of prescribed/recommended actions by midnight of the next day:  No  Name of provider notified for urgent issues identified:   Provider Method of Notification:     Pharmacist comments:   Time spent performing this drug regimen review (minutes):  30   Ebert Forrester 01/22/2022 1:06 PM

## 2022-01-22 NOTE — Progress Notes (Signed)
Physical Therapy Discharge Summary  Patient Details  Name: Brandon [name] MRN: [id number] Date of Birth: 1946/05/10  Date of Discharge from PT service:January 22, 2022  Today's Date: 01/22/2022 PT Individual Time: 1130-1230 PT Individual Time Calculation (min): 60 min    Patient has met 5 of 13 long term goals due to improved activity tolerance, improved balance, improved postural control, increased strength, ability to compensate for deficits, improved attention, improved awareness, and improved coordination.  Patient to discharge at an ambulatory level Supervision using RW for household distances and transport chair for community distances.  Patient's care partner is independent to provide the necessary physical and cognitive assistance at discharge.  Reasons goals not met: Goals set for supervision overall, patient continues to require CGA for safety/steadying support for mobility due to reduced postural control and motor planning deficits. Patient's family has demonstrated ability to assist patient at this level with 24/7 assist available at d/c. Floor transfer goal inappropriate at this time due to safety concerns, plan to progress with this with HHPT.   Recommendation:  Patient will benefit from ongoing skilled PT services in home health setting to continue to advance safe functional mobility, address ongoing impairments in balance, activity tolerance, functional mobility, gait and stair training, safety awareness, community integration, patient/caregiver education, and minimize fall risk.  Equipment: Recommending: RW and 18"x18" transport chair  Reasons for discharge: treatment goals met  Patient/family agrees with progress made and goals achieved: Yes  Skilled Therapeutic Interventions: Patient in w/c with family eating lunch upon PT arrival. PT agreeable to return in 30 min to allow patient to finish lunch with his family. Upon return patient in w/c finished with lunch. Patient  alert and agreeable to PT session. Patient denied pain during session.  Patient's wife and son present for family education and hands on training throughout session. Performed safe guarding with all mobility following PT cues and/or demonstration. Reinforced previous education on fall risk/prevention, home modifications to prevent falls, and activation of emergency services in the event of a fall during session.   Therapeutic Activity: Transfers: Patient performed sit to/from stand from w/c, mat table, arm chair, and standard chair without arms with CGA-close supervision. Family provided appropriate verbal cues for set-up, forward weight shift, and completing turn before sitting, despite patient sitting early with cues. Educated family on performing repeatative trials of mobility that he is performing unsafely. Has patient perform stand pivot x4 with blocked practice following PT demonstration of correct technique when turning to sit. Patient able to follow cues to complete turn with all transfers after.   Gait Training:  Patient ambulated >100 feet x4 using RW with CGA-close supervision. Ambulated with decreased gait speed, decreased step length and height, increased B knee flexion in stance R>L, forward trunk lean, and downward head gaze. Provided verbal cues for erect posture, increased R step height, looking ahead, and increased step length. Recommended patient's family maintain hold of gait belt due to patient's postural control deficits placing him at increased risk for fall. Family in agreement with CGA assist at d/c. Patient ascended/descended 1x4" step x2 to simulate home entry using RW with CGA. Performed step-to gait pattern leading with L while ascending and R while descending. Provided cues for technique and sequencing.   Patient in bed with his family at bedside without further questions/concerns at end of session with breaks locked, bed alarm not set as family is cleared for transfers in  the room per safety plan, and all needs within reach.   PT  Discharge Precautions/Restrictions Precautions Precautions: Fall Precaution Comments: Intermittent R lean with mobility Restrictions Weight Bearing Restrictions: No Pain Interference Pain Interference Pain Effect on Sleep: 0. Does not apply - I have not had any pain or hurting in the past 5 days Pain Interference with Therapy Activities: 1. Rarely or not at all Pain Interference with Day-to-Day Activities: 1. Rarely or not at all Vision/Perception  Vision - History Ability to See in Adequate Light: 0 Adequate Vision - Assessment Eye Alignment: Within Functional Limits Ocular Range of Motion: Within Functional Limits Alignment/Gaze Preference: Within Defined Limits Tracking/Visual Pursuits: Decreased smoothness of eye movement to RIGHT inferior field;Decreased smoothness of eye movement to RIGHT superior field;Requires cues, head turns, or add eye shifts to track Saccades: Additional eye shifts occurred during testing;Additional head turns occurred during testing;Decreased speed of saccadic movement Perception Perception: Within Functional Limits Spatial Orientation: Impaired body awareness when seated and standing. Still has lateral lean to the R but improved from eval Praxis Praxis: Impaired Praxis Impairment Details: Motor planning;Initiation  Cognition Overall Cognitive Status: Impaired/Different from baseline Arousal/Alertness: Awake/alert Orientation Level: Oriented X4 Attention: Sustained;Selective Sustained Attention: Appears intact Selective Attention: Impaired Selective Attention Impairment: Verbal basic;Functional basic Memory: Impaired Memory Impairment: Storage deficit;Retrieval deficit Awareness: Impaired Awareness Impairment: Emergent impairment Problem Solving: Impaired Problem Solving Impairment: Verbal basic;Functional basic Behaviors: Impulsive Safety/Judgment: Impaired [employer] Scales  of Cognitive Functioning: Automatic, Appropriate Sensation Sensation Light Touch: Appears Intact Hot/Cold: Not tested Proprioception: Impaired Detail Proprioception Impaired Details:  (generalized proprioception/body awarenss) Stereognosis: Not tested Coordination Gross Motor Movements are Fluid and Coordinated: No Fine Motor Movements are Fluid and Coordinated: No Coordination and Movement Description: decreased smoothness and coordination with movements, improved since eval Heel Shin Test: mild dysmetria, limited ROM on R due to hip weakness Motor  Motor Motor: Abnormal postural alignment and control Motor - Discharge Observations: Postural control improving with continued intermittent R or posterior lean or LOB with mobility  Mobility Bed Mobility Bed Mobility: Rolling Right;Supine to Sit;Sit to Supine;Rolling Left Rolling Right: Supervision/verbal cueing Rolling Left: Supervision/Verbal cueing Supine to Sit: Supervision/Verbal cueing Sit to Supine: Supervision/Verbal cueing Transfers Transfers: Sit to Stand;Stand to Sit;Stand Pivot Transfers Sit to Stand: Supervision/Verbal cueing;Contact Guard/Touching assist Stand to Sit: Supervision/Verbal cueing;Contact Guard/Touching assist Stand Pivot Transfers: Supervision/Verbal cueing;Contact Guard/Touching assist Stand Pivot Transfer Details: Verbal cues for sequencing;Visual cues/gestures for precautions/safety;Verbal cues for technique;Verbal cues for gait pattern;Verbal cues for safe use of DME/AE Transfer (Assistive device): Rolling walker Locomotion  Gait Ambulation: Yes Gait Assistance: Moderate Assistance - Patient 50-74% Gait Distance (Feet): 180 Feet Assistive device: Rolling walker Gait Assistance Details: Verbal cues for sequencing;Verbal cues for precautions/safety;Verbal cues for safe use of DME/AE;Verbal cues for gait pattern;Verbal cues for technique Gait Gait: Yes Gait Pattern: Left flexed knee in stance;Right  flexed knee in stance;Lateral trunk lean to right;Decreased step length - right;Decreased step length - left;Decreased hip/knee flexion - right;Trunk flexed;Narrow base of support Gait velocity: decreased Stairs / Additional Locomotion Stairs: Yes Stairs Assistance: Contact Guard/Touching assist Stair Management Technique: With walker Number of Stairs: 1 Height of Stairs: 8 Ramp: Contact Guard/touching assist (with RW) Curb: Contact Guard/Touching assist (with RW) Wheelchair Mobility Wheelchair Mobility: Yes Wheelchair Assistance: Chartered loss adjuster: Both lower extermities Wheelchair Parts Management: Supervision/cueing Distance: >150 ft  Trunk/Postural Assessment  Cervical Assessment Cervical Assessment:  (forward head) Thoracic Assessment Thoracic Assessment:  (rounded shoulders) Lumbar Assessment Lumbar Assessment:  (anterior pelvic tilt) Postural Control Postural Control: Deficits on evaluation Righting Reactions: list to right, flexed  knees. Postural Limitations: flexed knees when attempting to stand.  Balance Balance Balance Assessed: Yes Static Sitting Balance Static Sitting - Balance Support: Feet supported;Bilateral upper extremity supported Static Sitting - Level of Assistance: 5: Stand by assistance Dynamic Sitting Balance Dynamic Sitting - Balance Support: Feet supported;During functional activity;Right upper extremity supported;Left upper extremity supported Dynamic Sitting - Level of Assistance: 5: Stand by assistance;4: Min assist Sitting balance - Comments: Min A due to posterior and lateral lean. Static Standing Balance Static Standing - Balance Support: Bilateral upper extremity supported Static Standing - Level of Assistance: 5: Stand by assistance Dynamic Standing Balance Dynamic Standing - Balance Support: Bilateral upper extremity supported;During functional activity Dynamic Standing - Level of Assistance: 5: Stand by  assistance;4: Min assist Extremity Assessment  RLE Assessment RLE Assessment: Within Functional Limits General Strength Comments: grossly 4/5 RLE Strength RLE Overall Strength: Deficits Right Hip Flexion: 4/5 Right Hip ABduction: 4/5 Right Hip ADduction: 4+/5 Right Knee Flexion: 4+/5 Right Knee Extension: 4+/5 Right Ankle Dorsiflexion: 5/5 Right Ankle Plantar Flexion: 5/5 LLE Assessment LLE Assessment: Within Functional Limits General Strength Comments: Grossly 5/5 throughout in sitting   Mackinley Kiehn L Ravon Mcilhenny PT, DPT, NCS, CBIS  01/22/2022, 11:46 AM

## 2022-01-22 NOTE — Progress Notes (Signed)
PROGRESS NOTE   No new complaints this morning  Patient evaluated at bedside with son.  States he is doing well, no complaints or questions.  Feels prepared for discharge tomorrow.   ROS:  Denies fevers, chills, N/V, abdominal pain, constipation, diarrhea, SOB, cough, chest pain, new weakness or paraesthesias.     Objective:  No results for input(s): "WBC", "HGB", "HCT", "PLT" in the last 72 hours.  No results for input(s): "NA", "K", "CL", "CO2", "GLUCOSE", "BUN", "CREATININE", "CALCIUM" in the last 72 hours.    Intake/Output Summary (Last 24 hours) at 01/22/2022 1140 Last data filed at 01/22/2022 0804 Gross per 24 hour  Intake 420 ml  Output --  Net 420 ml        Physical Exam: Vital Signs Blood pressure (!) 151/77, pulse 73, temperature 98.1 F (36.7 C), temperature source Oral, resp. rate 18, height 6' 6.5" (1.994 m), weight 103.7 kg, SpO2 95 %. Constitution: Appropriate appearance for age. No apparnet distress  HEENT: PERRL, EOMI grossly intact. +HOH Resp: CTAB. No rales, rhonchi, or wheezing. Cardio: RRR. No mumurs, rubs, or gallops. No peripheral edema. Abdomen: Nondistended. Nontender. +bowel sounds. Psych: Appropriate mood and affect. Psych: Pt's affect is appropriate. Pt is cooperative. Skin: crani incisions cdi, s/p staple removal.  Neuro: CN 2-12 grossly intact. Follows all commands. AAOx4. Some mild delayed processing but otherwise no apparent deficits - ongoing   Musculoskeletal: Moves all 4 extremities against resistance and gravity  Assessment/Plan: 1. Functional deficits which require 3+ hours per day of interdisciplinary therapy in a comprehensive inpatient rehab setting. Physiatrist is providing close team supervision and 24 hour management of active medical problems listed below. Physiatrist and rehab team continue to assess barriers to discharge/monitor patient progress toward functional and  medical goals  Care Tool:  Bathing    Body parts bathed by patient: Right arm, Chest, Left arm, Abdomen, Front perineal area, Right upper leg, Buttocks, Right lower leg, Left upper leg, Left lower leg, Face   Body parts bathed by helper: Right arm, Left arm, Chest, Abdomen, Buttocks, Right upper leg, Left upper leg, Right lower leg, Left lower leg     Bathing assist Assist Level: Supervision/Verbal cueing     Upper Body Dressing/Undressing Upper body dressing   What is the patient wearing?: Pull over shirt    Upper body assist Assist Level: Supervision/Verbal cueing    Lower Body Dressing/Undressing Lower body dressing    Lower body dressing activity did not occur: Environmental limitations What is the patient wearing?: Pants, Underwear/pull up     Lower body assist Assist for lower body dressing: Minimal Assistance - Patient > 75%     Toileting Toileting    Toileting assist Assist for toileting: Minimal Assistance - Patient > 75%     Transfers Chair/bed transfer  Transfers assist  Chair/bed transfer activity did not occur: Safety/medical concerns  Chair/bed transfer assist level: Contact Guard/Touching assist Chair/bed transfer assistive device: Armrests, Programmer, multimedia   Ambulation assist      Assist level: Contact Guard/Touching assist Assistive device: Walker-rolling Max distance: 135   Walk 10 feet activity   Assist     Assist level: Contact  Guard/Touching assist Assistive device: Walker-rolling   Walk 50 feet activity   Assist Walk 50 feet with 2 turns activity did not occur: Safety/medical concerns  Assist level: Contact Guard/Touching assist Assistive device: Walker-rolling    Walk 150 feet activity   Assist Walk 150 feet activity did not occur: Safety/medical concerns         Walk 10 feet on uneven surface  activity   Assist Walk 10 feet on uneven surfaces activity did not occur: Safety/medical  concerns   Assist level: Contact Guard/Touching assist Assistive device: Walker-rolling   Wheelchair     Assist Is the patient using a wheelchair?: Yes Type of Wheelchair: Manual Wheelchair activity did not occur: Safety/medical concerns  Wheelchair assist level: Minimal Assistance - Patient > 75% Max wheelchair distance: 150 ft    Wheelchair 50 feet with 2 turns activity    Assist    Wheelchair 50 feet with 2 turns activity did not occur: Safety/medical concerns   Assist Level: Supervision/Verbal cueing   Wheelchair 150 feet activity     Assist  Wheelchair 150 feet activity did not occur: Safety/medical concerns   Assist Level: Minimal Assistance - Patient > 75%   Blood pressure (!) 151/77, pulse 73, temperature 98.1 F (36.7 C), temperature source Oral, resp. rate 18, height 6' 6.5" (1.994 m), weight 103.7 kg, SpO2 95 %.  Medical Problem List and Plan: 1. Functional deficits secondary to bilateral SDH's after fall s/p bilateral burr hole evacuation 8/14             -patient may shower             -ELOS/Goals: 12-14 days, supervision to mod I goals with PT, OT, SLP  Continue CIR- PT, OT and SLP 2.  Antithrombotics: -DVT/anticoagulation:  Mechanical:  Antiembolism stockings, knee (TED hose) Bilateral lower extremities Sequential compression devices, below knee Bilateral lower extremities             - CT head 8/24 stable -> Lovenox 40 IM mg daily for DVT prophy 8/25             -antiplatelet therapy: n/a  3. Pain Management/headache: ongoing             -continue Tylenol 1000 mg TID scheduled and DC hydrocodone 8/22 -> Fioricet 1 tab TID + Tylenol 500 mg BID PRN for HA, pain 8/25 -> Fioricet/tylenol PRN 8/28; restart scheduled as not getting PRNs despite ongoing HA 8/29            - Gabapentin 100 mg BID for persistent HA started 8/28 -> DC 8/29 d/t worsening cognition  9/2- denies pain today injection likely was extremely helpful 4. Mood/Behavior/Sleep -  improved:              -pt seems fairly up beat and wife is very supportive             -antipsychotic agents: n/a             - 8/26 Altered sleep wake cycle- trazodone increase trazodone to '75mg'$  for sleep-> 100 mg 8/29                           - Retimed to 1900 9/1  - improved sleep and nighttime attention per family                 5. Neuropsych/cognition: This patient is not quite capable of making decisions on his own behalf.            -  Given persistent slow processing and attention despite delirum workup, in setting of TBI, will trial Ritalin 5 mg BID 8/29 to improve arousal and participation -> reduce to 5 mg daily d/t nighttime hyperarrousal 8/31- improved  6. Skin/Wound Care: local care to scalp, removed staples 8/25    7. Fluids/Electrolytes/Nutrition: encourage appropriate po intake             -liberalized him to regular diet to help with po intake. Encouraged wife to bring food from home as well. Poor po intake so far however.   8. Seizure prophylaxis: keppra '500mg'$  q 12 hours -> DC 8/23 9. Hx of Prostate Cancer, c/b urinary incontinence - ongoing             -continue flomax 0.'4mg'$  bid, proscar '5mg'$  daily              -emptying with intermittent incontinence thus far              - UA 8/23 without s/s infection             - Discussed incontinence of bowel and bladder with family in detail 8/25; they feel he is now close to his baseline, which is intermittently incontinent of both                  10. HTN: well controlled             -continue hctz/lisinopril daily per home regimen; monitor for orthostasis  11. Osteoarthritis bilateral knees, R>L; radiographic findings indicative of CPPD 8/31               -continue voltaren gel to both knees - increased 8/25               - Discussing RLE bracing with PT for support and sensory feedback 8/25             -his knee pain certainly was contributing to his recent falls and balance deficits in the months prior to this admit              - voltaren gel to b/l knees 4 g 8/25            - xray R knee 8/31 showing signs of CPPD; discussed with family need for OP workup including joint aspiration             - s/p R knee steroid injection 9/1, with significant pain and gait improvement  12. Delirium, likely multifactorial etiology (post-hospitalization, polypharmacy, TBI) - resolved - Ensure wearing hearing aides; wife has microphone available to amplify  - UA 8/23 stable; CBC 8/21-22 stable, no leukocytosis - Delirium precautions added and discussed with family - Keppra may be contributing, DC 8/23  - repeat CT head  - ordered 8/24 - stable - initiate sleep log 8/24 - reinforce 8/29 - DC PRN compazine and zofran, not using and has cog s/e   13.  Constipation/diarrhea- LBM 9/10 - PRN suppository 8/27, with results - Daily Miralax + 1x dose 8/29 -> PRN 9/4 - continue Sennakot-S 1 TAB QHS 8/28 -> BID 8/29 -> QHS 9/4 - PRN suppository 8/29 - +results - h/h 9/7 given large black BM  - stable - Per family, at home will return to regular bowel regimen  Hypokalemia:   -K+ 3.3 , KCL supplement -> 3.7 8/28-> 3.3 9/1 on 40 meq daily - repeat 9/5 3.9; continue on 20 MEQ daily and recheck 9/11 - pending  LOS: 23  days A FACE TO FACE EVALUATION WAS PERFORMED  Est Dispo: Tuesday, Sept 12th to home with family assistance   Gertie Gowda, DO 01/22/2022

## 2022-01-22 NOTE — Plan of Care (Signed)
  Problem: RH Bathing Goal: LTG Patient will bathe all body parts with assist levels (OT) Description: LTG: Patient will bathe all body parts with assist levels (OT) Outcome: Completed/Met   Problem: RH Dressing Goal: LTG Patient will perform upper body dressing (OT) Description: LTG Patient will perform upper body dressing with assist, with/without cues (OT). Outcome: Completed/Met Goal: LTG Patient will perform lower body dressing w/assist (OT) Description: LTG: Patient will perform lower body dressing with assist, with/without cues in positioning using equipment (OT) Outcome: Completed/Met   Problem: RH Toileting Goal: LTG Patient will perform toileting task (3/3 steps) with assistance level (OT) Description: LTG: Patient will perform toileting task (3/3 steps) with assistance level (OT)  Outcome: Completed/Met   Problem: RH Toilet Transfers Goal: LTG Patient will perform toilet transfers w/assist (OT) Description: LTG: Patient will perform toilet transfers with assist, with/without cues using equipment (OT) Outcome: Completed/Met

## 2022-01-22 NOTE — Progress Notes (Signed)
Physical Therapy Session Note  Patient Details  Name: Brandon [name] MRN: [id number] Date of Birth: 04-27-1946  Today's Date: 01/22/2022 PT Individual Time: 0830-0900 PT Individual Time Calculation (min): 30 min   Short Term Goals: Week 3:  PT Short Term Goal 1 (Week 3): STGs = LTGs  Skilled Therapeutic Interventions/Progress Updates: Pt presents supine in bed and agreeable to therapy.  Pt donned socks supine in bed w/ set-up.  Pt transferred sup to sit w/ supervision and then scoots to EOB.  Pt donned shoes sitting EOB once given.  Pt transferred sit to stand w/ CGA.  Pt amb short distance in room w/ RW and CGA.  Pt does require verbal cues for safe turns ? Due to size 16 shoes!  Pt wheeled to small gym for time conservation.  Pt amb x 25' to simulated care height and performed transfer w/ CGA and use of UE s d/t size.  Pt amb back to w/c w/ CGA, but continues w/ need for verbal cueing for safe turns to approach seat.  Pt amb x 135' w/ RW and CGA, w/ noted fatigue at conclusion w/ mild knee flexion, unable to correct.  Pt returned to room and performed SPT w/c> bed w/ CGA, sit to supine transfer w/ supervision.  Pt remained in bed w/ bed alarm on and all needs in reach.     Therapy Documentation Precautions:  Precautions Precautions: Fall Precaution Comments: Intermittent R lean with mobility Restrictions Weight Bearing Restrictions: No General:   Vital Signs:  Pain:0/10 Pain Assessment Pain Scale: 0-10 Pain Score: 0-No pain Mobility:   Locomotion :    Trunk/Postural Assessment : Cervical Assessment Cervical Assessment:  (forward head) Thoracic Assessment Thoracic Assessment:  (rounded shoulders) Lumbar Assessment Lumbar Assessment:  (anterior pelvic tilt) Postural Control Postural Control: Deficits on evaluation Righting Reactions: list to right, flexed knees. Postural Limitations: flexed knees when attempting to stand.     Therapy/Group: Individual  Therapy  Ladoris Gene 01/22/2022, 9:08 AM

## 2022-01-22 NOTE — Plan of Care (Signed)
  Problem: RH Expression Communication Goal: LTG Patient will increase speech intelligibility (SLP) Description: LTG: Patient will increase speech intelligibility at word/phrase/conversation level with cues, % of the time (SLP) Outcome: Completed/Met   Problem: RH Problem Solving Goal: LTG Patient will demonstrate problem solving for (SLP) Description: LTG:  Patient will demonstrate problem solving for basic/complex daily situations with cues  (SLP) Outcome: Completed/Met   Problem: RH Memory Goal: LTG Patient will use memory compensatory aids to (SLP) Description: LTG:  Patient will use memory compensatory aids to recall biographical/new, daily complex information with cues (SLP) Outcome: Completed/Met   Problem: RH Attention Goal: LTG Patient will demonstrate this level of attention during functional activites (SLP) Description: LTG:  Patient will will demonstrate this level of attention during functional activites (SLP) Outcome: Completed/Met   Problem: RH Expression Communication Goal: LTG Patient will verbally express basic/complex needs(SLP) Description: LTG:  Patient will verbally express basic/complex needs, wants or ideas with cues  (SLP) Outcome: Completed/Met Goal: LTG Patient will increase word finding of common (SLP) Description: LTG:  Patient will increase word finding of common objects/daily info/abstract thoughts with cues using compensatory strategies (SLP). Outcome: Completed/Met

## 2022-01-23 ENCOUNTER — Encounter: Payer: Self-pay | Admitting: Neurology

## 2022-01-23 NOTE — Plan of Care (Signed)
  Problem: Consults Goal: RH BRAIN INJURY PATIENT EDUCATION Description: Description: See Patient Education module for eduction specifics Outcome: Progressing Goal: Skin Care Protocol Initiated - if Braden Score 18 or less Description: If consults are not indicated, leave blank or document N/A Outcome: Progressing Goal: Nutrition Consult-if indicated Outcome: Progressing   Problem: RH BOWEL ELIMINATION Goal: RH STG MANAGE BOWEL WITH ASSISTANCE Description: STG Manage Bowel with min Assistance. Outcome: Progressing Goal: RH STG MANAGE BOWEL W/MEDICATION W/ASSISTANCE Description: STG Manage Bowel with Medication with min Assistance. Outcome: Progressing   Problem: RH BLADDER ELIMINATION Goal: RH STG MANAGE BLADDER WITH ASSISTANCE Description: STG Manage Bladder With min Assistance Outcome: Progressing Goal: RH STG MANAGE BLADDER WITH MEDICATION WITH ASSISTANCE Description: STG Manage Bladder With Medication With min Assistance. Outcome: Progressing   Problem: RH SKIN INTEGRITY Goal: RH STG SKIN FREE OF INFECTION/BREAKDOWN Description: Skin will be free of infection/breakdown with min assist Outcome: Progressing Goal: RH STG MAINTAIN SKIN INTEGRITY WITH ASSISTANCE Description: STG Maintain Skin Integrity With min Assistance. Outcome: Progressing Goal: RH STG ABLE TO PERFORM INCISION/WOUND CARE W/ASSISTANCE Description: STG Able To Perform Incision/Wound Care With min Assistance. Outcome: Progressing   Problem: RH SAFETY Goal: RH STG ADHERE TO SAFETY PRECAUTIONS W/ASSISTANCE/DEVICE Description: STG Adhere to Safety Precautions With min Assistance/Device. Outcome: Progressing Goal: RH STG DECREASED RISK OF FALL WITH ASSISTANCE Description: STG Decreased Risk of Fall With min Assistance. Outcome: Progressing   Problem: RH COGNITION-NURSING Goal: RH STG ANTICIPATES NEEDS/CALLS FOR ASSIST W/ASSIST/CUES Description: STG Anticipates Needs/Calls for Assist With min  Assistance/Cues. Outcome: Progressing   Problem: RH PAIN MANAGEMENT Goal: RH STG PAIN MANAGED AT OR BELOW PT'S PAIN GOAL Description: Pain will be managed at 3 out of 10 on pain scale with min assist Outcome: Progressing   Problem: RH KNOWLEDGE DEFICIT BRAIN INJURY Goal: RH STG INCREASE KNOWLEDGE OF SELF CARE AFTER BRAIN INJURY Description: Patient/caregiver will be able to perform all self care mod I via therapy education, nursing education, and nursing handouts Outcome: Progressing
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
## 2022-01-24 ENCOUNTER — Telehealth: Payer: Self-pay | Admitting: Adult Health

## 2022-01-24 ENCOUNTER — Telehealth: Payer: Self-pay | Admitting: *Deleted

## 2022-01-24 ENCOUNTER — Telehealth: Payer: Self-pay

## 2022-01-24 NOTE — Progress Notes (Signed)
Inpatient Rehabilitation Care Coordinator Discharge Note   Patient Details  Name: Brandon [name] MRN: [id number] Date of Birth: [birth year]   Discharge location: D/c to home  Length of Stay: 23 days  Discharge activity level: ambulatory level Supervision using RW for household distances and transport chair for community distances  Home/community participation: Limited  Patient response RS:WNIOEV Literacy - How often do you need to have someone help you when you read instructions, pamphlets, or other written material from your doctor or pharmacy?: Never  Patient response OJ:[id number] Isolation - How often do you feel lonely or isolated from those around you?: Patient unable to respond  Services provided included: MD, RD, PT, OT, SLP, RN, CM, Neuropsych, SW, Pharmacy, [place]:  Charity fundraiser Utilized: [place] [employer]  Choices offered to/list presented to: Yes  Follow-up services arranged:  Home Health, Patient/Family has no preference for HH/DME agencies, DME Home Health Agency: [facility] for HHPT/OT/SLP/aide    DME : [employer] for transport chair and rolling walker    Patient response to transportation need: Is the patient able to respond to transportation needs?: Yes In the past 12 months, has lack of transportation kept you from medical appointments or from getting medications?: No In the past 12 months, has lack of transportation kept you from meetings, work, or from getting things needed for daily living?: No   Comments (or additional information):  Patient/Family verbalized understanding of follow-up arrangements:  Yes  Individual responsible for coordination of the follow-up plan: contact pt wife Curt Bears  Confirmed correct DME delivered: Rana Snare 01/24/2022    Rana Snare

## 2022-01-24 NOTE — Progress Notes (Signed)
Inpatient Rehabilitation Care Coordinator Discharge Note   Patient Details  Name: Brandon [name] MRN: [id number] Date of Birth: Jun 20, 1945   Discharge location: D/c to home  Length of Stay: 23 days  Discharge activity level: ambulatory level Supervision using RW for household distances and transport chair for community distances  Home/community participation: Limited  Patient response FY:TWKMQK Literacy - How often do you need to have someone help you when you read instructions, pamphlets, or other written material from your doctor or pharmacy?: Never  Patient response MM:NOTRRN Isolation - How often do you feel lonely or isolated from those around you?: Patient unable to respond  Services provided included: MD, RD, PT, OT, SLP, RN, CM, Neuropsych, SW, Pharmacy, [place]:  Charity fundraiser Utilized: [place] [employer]  Choices offered to/list presented to: Yes  Follow-up services arranged:  Home Health, Patient/Family has no preference for HH/DME agencies, DME Home Health Agency: [facility] for HHPT/OT/SLP/aide    DME : [employer] for transport chair and rolling walker    Patient response to transportation need: Is the patient able to respond to transportation needs?: Yes In the past 12 months, has lack of transportation kept you from medical appointments or from getting medications?: No In the past 12 months, has lack of transportation kept you from meetings, work, or from getting things needed for daily living?: No   Comments (or additional information):  Patient/Family verbalized understanding of follow-up arrangements:  Yes  Individual responsible for coordination of the follow-up plan: contact pt wife Brandon [name]  Confirmed correct DME delivered: Rana Snare 01/24/2022    Rana Snare

## 2022-01-24 NOTE — Telephone Encounter (Signed)
Requesting verbal order for Home health physical therapy 2x4/1x4

## 2022-01-24 NOTE — Patient Outreach (Signed)
  Care Coordination [facility] Note Transition Care Management Unsuccessful Follow-up Telephone Call  Date of discharge and from where:  01/23/22 from [facility]  Attempts:  1st Attempt  Reason for unsuccessful TCM follow-up call:  Left voice message  Chong Sicilian, BSN, RN-BC RN Care Coordinator [place]: [phone] Main #: [phone]

## 2022-01-24 NOTE — Telephone Encounter (Signed)
Transition Care Management Unsuccessful Follow-up Telephone Call TCM DC [facility] 01-23-22 Dx:  Traumatic brain injury with loss of consciousness Date of discharge and from where:    Attempts:  1st Attempt  Reason for unsuccessful TCM follow-up call:  Left voice message

## 2022-01-25 ENCOUNTER — Other Ambulatory Visit (HOSPITAL_COMMUNITY): Payer: Self-pay

## 2022-01-25 ENCOUNTER — Telehealth: Payer: Self-pay

## 2022-01-25 NOTE — Telephone Encounter (Signed)
Verbal orders given to [facility]

## 2022-01-25 NOTE — Telephone Encounter (Signed)
Per Marcene Corning   Transition Care Management Follow-up Telephone Call Date of discharge and from where: 01/23/2022, [facility] How have you been since you were released from the hospital? Doing better, walking with walker with help, eating well Any questions or concerns? Yes, did not receive the proscar or flomax from the [facility]  Items Reviewed: Did the pt receive and understand the discharge instructions provided? Yes  Medications obtained and verified?  Missing two meds Other? No  Any new allergies since your discharge? No  Dietary orders reviewed? Yes Do you have support at home? Yes   Home Care and Equipment/Supplies: Were home health services ordered? yes If so, what is the name of the agency? [facility]  Has the agency set up a time to come to the patient's home? yes Were any new equipment or medical supplies ordered?  Yes: walker What is the name of the medical supply agency?  Were you able to get the supplies/equipment? yes Do you have any questions related to the use of the equipment or supplies? No  Functional Questionnaire: (I = Independent and D = Dependent) ADLs: D  Bathing/Dressing- D  Meal Prep- D  Eating- I  Maintaining continence- D  Transferring/Ambulation- D  Managing Meds- D  Follow up appointments reviewed:  PCP Hospital f/u appt confirmed? Yes  Scheduled to see Dorothyann Peng on 01/30/2022 @ 8:00. Are transportation arrangements needed? No  If their condition worsens, is the pt aware to call PCP or go to the Emergency Dept.? Yes Was the patient provided with contact information for the PCP's office or ED? Yes Was to pt encouraged to call back with questions or concerns? Yes

## 2022-01-25 NOTE — Telephone Encounter (Signed)
Okay for verbal orders? Please advise 

## 2022-01-26 ENCOUNTER — Other Ambulatory Visit: Payer: Self-pay | Admitting: Adult Health

## 2022-01-26 MED ORDER — TAMSULOSIN HCL 0.4 MG PO CAPS: 0.4000 mg | ORAL_CAPSULE | Freq: Two times a day (BID) | ORAL | 0 refills | Status: DC

## 2022-01-26 MED ORDER — FINASTERIDE 5 MG PO TABS: 5.0000 mg | ORAL_TABLET | Freq: Every day | ORAL | 0 refills | Status: DC

## 2022-01-30 ENCOUNTER — Ambulatory Visit (INDEPENDENT_AMBULATORY_CARE_PROVIDER_SITE_OTHER): Payer: Medicare HMO | Admitting: Adult Health

## 2022-01-30 ENCOUNTER — Encounter: Payer: Self-pay | Admitting: Adult Health

## 2022-01-30 VITALS — BP 122/80

## 2022-01-30 DIAGNOSIS — S069X9S Unspecified intracranial injury with loss of consciousness of unspecified duration, sequela: Secondary | ICD-10-CM | POA: Diagnosis not present

## 2022-01-30 DIAGNOSIS — S065XAA Traumatic subdural hemorrhage with loss of consciousness status unknown, initial encounter: Secondary | ICD-10-CM | POA: Diagnosis not present

## 2022-01-30 DIAGNOSIS — R319 Hematuria, unspecified: Secondary | ICD-10-CM

## 2022-01-30 DIAGNOSIS — R2681 Unsteadiness on feet: Secondary | ICD-10-CM | POA: Diagnosis not present

## 2022-01-30 LAB — URINALYSIS, ROUTINE W REFLEX MICROSCOPIC
Hgb urine dipstick: NEGATIVE
Leukocytes,Ua: NEGATIVE
Nitrite: NEGATIVE
Specific Gravity, Urine: 1.015 (ref 1.000–1.030)
Total Protein, Urine: 30 — AB
Urine Glucose: NEGATIVE
Urobilinogen, UA: 4 — AB (ref 0.0–1.0)
pH: 7 (ref 5.0–8.0)

## 2022-01-30 NOTE — Progress Notes (Signed)
Subjective:    Patient ID: Brandon [name], male    DOB: 06-05-1945, 76 y.o.   MRN: [id number]  HPI Patient presents for yearly preventative medicine examination. He is a pleasant 76 year old male who  has a past medical history of Allergy, Basal cell carcinoma, GERD (gastroesophageal reflux disease), History of kidney cancer, and Hypertension.  He presents to the office today for TCM visit   Admit Date 12/24/2021 Discharge Date 12/30/2021 Discharge From Rehab: 01/22/2022  Presented to the emergency room after sustaining multiple falls over the last few weeks.  He reported some mild headaches but no nausea vomiting or dizziness.    Hospital Course On admission imaging revealed a large bilateral subacute subdural hematomas with mass effect.  On 12/25/2021 he underwent bilateral bur hole evacuation with drain placement.  Postoperative with slow progressive gains.  He was still struggling with balance and self-care tasks.  His drains were removed on 12/28/2021.  He was then admitted for inpatient therapies to consist of PT, ST and OT at least 3 hours 5 days a week.  CT showed interval decrease in size of right extra-axial collection and resolution of midline shift.  He had no new or progressive hemorrhage.  His pain was managed with the use of Fioricet for headaches as well with scheduled Voltaren gel.  Ritalin had been added to patient's regimen to establish better focus and attention to task.  He was also using trazodone for sleep.  Additionally he was followed by neuropsychology during his hospital rehabilitation stay.  He had ultimately been maintained on Keppra for seizure prophylaxis completing course with no seizure activity noted.  He does have a history of prostate cancer with some urinary incontinence maintained on Flomax as well as history of hypertension made on HCTZ and lisinopril prior to admission.  He was continued on Lipitor for her lipidemia  During his rehab course he had  improvements in activity tolerance, balance, postural control as well as the ability to compensate for deficits.  Today in the office he is with his wife and son.  They report that he has been making some significant improvement while in rehab and they help that this continues since home health is coming into home with PT/OT/SP.  He is using a rolling walker to ambulate and has very little difficulty getting up by himself.  He has not had any headaches or blurred vision.  His wife does report there is some mild deficits when it comes to managing money but otherwise he is doing fairly well.  They report that since being discharged he has had 2 episodes of hematuria but no other symptoms of a UTI. He does not have a history of kidney stones   He has a follow-up this week with neurosurgery and has an appointment with neurology for November 1 but is on a cancellation list   Review of Systems  Constitutional:  Positive for activity change.  Respiratory: Negative.    Cardiovascular: Negative.   Gastrointestinal: Negative.   Genitourinary: Negative.   Musculoskeletal:  Positive for gait problem.  Skin: Negative.   Neurological:  Positive for weakness. Negative for dizziness, light-headedness, numbness and headaches.  Hematological: Negative.   Psychiatric/Behavioral: Negative.    All other systems reviewed and are negative.  Past Medical History:  Diagnosis Date   Allergy    Basal cell carcinoma    GERD (gastroesophageal reflux disease)    History of kidney cancer    benign; told not cancerous  Hypertension     Social History   Socioeconomic History   Marital status: Married    Spouse name: Not on file   Number of children: Not on file   Years of education: Not on file   Highest education level: Bachelor's degree (e.g., BA, AB, BS)  Occupational History   Not on file  Tobacco Use   Smoking status: Former   Smokeless tobacco: Never  Vaping Use   Vaping Use: Never used   Substance and Sexual Activity   Alcohol use: Yes    Alcohol/week: 0.0 standard drinks of alcohol    Comment: "Beer every once in a while"   Drug use: No   Sexual activity: Not Currently  Other Topics Concern   Not on file  Social History Narrative   Retired from [employer]   Married    Three children (Two in [place] and one in [place])    Social Determinants of Health   Financial Resource Strain: Low Risk  (11/23/2021)   Overall Financial Resource Strain (CARDIA)    Difficulty of Paying Living Expenses: Not hard at all  Food Insecurity: No Food Insecurity (11/23/2021)   Hunger Vital Sign    Worried About Running Out of Food in the Last Year: Never true    [place] in the Last Year: Never true  Transportation Needs: No Transportation Needs (11/23/2021)   PRAPARE - Hydrologist (Medical): No    Lack of Transportation (Non-Medical): No  Physical Activity: Insufficiently Active (11/23/2021)   Exercise Vital Sign    Days of Exercise per Week: 1 day    Minutes of Exercise per Session: 10 min  Stress: No Stress Concern Present (11/23/2021)   [place]    Feeling of Stress : Not at all  Social Connections: Unknown (11/23/2021)   Social Connection and Isolation Panel [NHANES]    Frequency of Communication with Friends and Family: Once a week    Frequency of Social Gatherings with Friends and Family: Patient refused    Attends Religious Services: 1 to 4 times per year    Active Member of [employer] or Organizations: No    Attends Music therapist: Patient refused    Marital Status: Married  Recent Concern: Social Connections - Socially Isolated (11/02/2021)   Social Connection and Isolation Panel [NHANES]    Frequency of Communication with Friends and Family: Once a week    Frequency of Social Gatherings with Friends and Family: Once a week    Attends Religious Services: Never     Marine scientist or Organizations: No    Attends Archivist Meetings: Patient refused    Marital Status: Married  Human resources officer Violence: Not At Risk (11/02/2021)   Humiliation, Afraid, Rape, and Kick questionnaire    Fear of Current or Ex-Partner: No    Emotionally Abused: No    Physically Abused: No    Sexually Abused: No    Past Surgical History:  Procedure Laterality Date   CATARACT EXTRACTION Bilateral    CRANIOTOMY Bilateral 12/25/2021   Procedure: CRANIOTOMY HEMATOMA EVACUATION SUBDURAL;  Surgeon: Earnie Larsson, MD;  Location: [facility];  Service: Neurosurgery;  Laterality: Bilateral;   HERNIA REPAIR     x2   KIDNEY SURGERY Right    Had right kidney removed.    TONSILLECTOMY     VASECTOMY      Family History  Problem  Relation Age of Onset   Cancer Mother        Breast    Cancer Father        Lung    Colon cancer Neg Hx     No Known Allergies  Current Outpatient Medications on File Prior to Visit  Medication Sig Dispense Refill   acetaminophen (TYLENOL) 500 MG tablet Take 1 tablet (500 mg total) by mouth 2 (two) times daily as needed for mild pain. 30 tablet 0   atorvastatin (LIPITOR) 20 MG tablet Take 1 tablet (20 mg total) by mouth daily. 30 tablet 0   B Complex Vitamins (VITAMIN B COMPLEX) TABS Take by mouth daily. 30 tablet 0   butalbital-acetaminophen-caffeine (FIORICET) 50-325-40 MG tablet Take 1 tablet by mouth every 6 (six) hours as needed for headache. 10 tablet 0   cetirizine (ZYRTEC) 10 MG tablet Take 10 mg by mouth daily.     Cholecalciferol 25 MCG (1000 UT) tablet Take 1 tablet (1,000 Units total) by mouth daily. 30 tablet 0   diclofenac Sodium (VOLTAREN) 1 % GEL Apply 4 g topically 3 (three) times daily. 100 g 0   finasteride (PROSCAR) 5 MG tablet Take 1 tablet (5 mg total) by mouth daily. 90 tablet 0   latanoprost (XALATAN) 0.005 % ophthalmic solution Place 1 drop into both eyes at bedtime.     lisinopril-hydrochlorothiazide (ZESTORETIC)  20-25 MG tablet Take 1 tablet by mouth daily. 30 tablet 0   methylphenidate (RITALIN) 5 MG tablet Take 1 tablet (5 mg total) by mouth daily with breakfast. 30 tablet 0   pantoprazole (PROTONIX) 40 MG tablet Take 1 tablet (40 mg total) by mouth at bedtime. 30 tablet 0   tamsulosin (FLOMAX) 0.4 MG CAPS capsule Take 1 capsule (0.4 mg total) by mouth 2 (two) times daily. 180 capsule 0   traZODone (DESYREL) 100 MG tablet Take 1 tablet (100 mg total) by mouth at bedtime. 30 tablet 0   vitamin C (ASCORBIC ACID) 500 MG tablet Take 500 mg by mouth daily.     No current facility-administered medications on file prior to visit.    There were no vitals taken for this visit.      Objective:   Physical Exam Vitals and nursing note reviewed.  Constitutional:      Appearance: Normal appearance.  Cardiovascular:     Rate and Rhythm: Normal rate and regular rhythm.     Pulses: Normal pulses.     Heart sounds: Normal heart sounds.  Pulmonary:     Effort: Pulmonary effort is normal.     Breath sounds: Normal breath sounds.  Skin:    General: Skin is warm and dry.     Comments: Surgical wounds on scalp appear well healed   Neurological:     General: No focal deficit present.     Mental Status: He is alert and oriented to person, place, and time.     Motor: Weakness present.     Gait: Gait abnormal (in wheelchhair for exam).  Psychiatric:        Mood and Affect: Mood normal.        Behavior: Behavior normal.        Thought Content: Thought content normal.      Assessment & Plan:  1. SDH (subdural hematoma) ([place]) - Reviewed hospital notes, discharge instructions, and labs, imaging, and medications with patient and family. All questions answered to the best of my ability  -Follow-up with neurosurgery and neurology as directed  2. Gait instability -Continue to work with PT/OT.  Use rolling walker with ambulation  3. Hematuria, unspecified type  - Urinalysis; Future - Urinalysis  4.  Traumatic brain injury with loss of consciousness, sequela ([place]) -Follow-up with neurosurgery and neurology as directed  Dorothyann Peng, NP

## 2022-02-01 DIAGNOSIS — R6889 Other general symptoms and signs: Secondary | ICD-10-CM | POA: Diagnosis not present

## 2022-02-06 ENCOUNTER — Other Ambulatory Visit: Payer: Self-pay | Admitting: Neurosurgery

## 2022-02-06 DIAGNOSIS — S065XAA Traumatic subdural hemorrhage with loss of consciousness status unknown, initial encounter: Secondary | ICD-10-CM

## 2022-02-07 ENCOUNTER — Encounter: Payer: Self-pay | Admitting: Physical Medicine and Rehabilitation

## 2022-02-07 ENCOUNTER — Encounter: Payer: Medicare HMO | Attending: Physical Medicine and Rehabilitation | Admitting: Physical Medicine and Rehabilitation

## 2022-02-07 VITALS — BP 91/63 | HR 86 | Ht 78.5 in | Wt 222.2 lb

## 2022-02-07 DIAGNOSIS — G3189 Other specified degenerative diseases of nervous system: Secondary | ICD-10-CM | POA: Diagnosis not present

## 2022-02-07 DIAGNOSIS — S069XAS Unspecified intracranial injury with loss of consciousness status unknown, sequela: Secondary | ICD-10-CM | POA: Diagnosis not present

## 2022-02-07 DIAGNOSIS — R269 Unspecified abnormalities of gait and mobility: Secondary | ICD-10-CM | POA: Diagnosis not present

## 2022-02-07 DIAGNOSIS — S069X9S Unspecified intracranial injury with loss of consciousness of unspecified duration, sequela: Secondary | ICD-10-CM

## 2022-02-07 DIAGNOSIS — F09 Unspecified mental disorder due to known physiological condition: Secondary | ICD-10-CM | POA: Diagnosis not present

## 2022-02-07 NOTE — Assessment & Plan Note (Signed)
D/t Bilateral SDH s/p falls, s/p burr hole 8/14 with Dr. Sandrea Hammond to be recovering well; continue home PT, OT, and SLP services.   Follow up PRN

## 2022-02-07 NOTE — Assessment & Plan Note (Signed)
Continues to be mildly impulsive, but now with much clearer cognition and less delay than prior.  Wife advised to monitor closely, and patient advised to avoid falls from being impulsive with standing, transfering, etc.   No longer on Ritalin for arrousal; continue current medication regimen.

## 2022-02-07 NOTE — Patient Instructions (Signed)
Follow up as needed for therapies, equipment needs, medications or questions.

## 2022-02-07 NOTE — Progress Notes (Signed)
Subjective:    Patient ID: Brandon [name], male    DOB: June 23, 1945, 76 y.o.   MRN: [id number]  HPI  Brandon [name] is a 76 y.o. right-handed male with history of hypertension, hyperlipidemia, prostate cancer who presents to PM&R clinic as in inpatient rehab transition of care after admission 8/19-9/12/23 following hospitalization for bilateral SDH s/p fall.  He presents with his wife.  Briefly, he was admitted 12/24/2021 after multiple falls over several weeks, most recently striking his head on a table with brief loss of consciousness. Admission imaging revealed large bilateral subacute subdural hematomas with mass effect.  On 12/25/2021 underwent bilateral bur hole evacuation with drain placement per Dr. Earnie Larsson. His drains were removed 12/28/2021, and he was admitted to [facility] 8/19 for ongoing issues with cognition, balance, and function. Repeat  CT scan showed interval decrease in size of right extra-axial collection and resolution of midline shift.  Rehab course was c/b persistent headaches (Fioricet PRN), poor arrousal (Ritalin BID, Dced ppx Keppra), sleep-wake cycle disruption (Trazadone PRN), urinary incontinence (Flomax).  He was discharged to home with his wife and son as caregivers.   Interval Hx:  - Getting OT, PT, and SLP at the house. "He gets really tired after his PT but it goes away after he rests." No falls or near falls; he needs reminders to move slowly and use his walker, but overall is moving with more confidence and awareness every day.   - Repeat CT scan coming up with Dr. Trenton Gammon; overall doing well, no new neurologic s/s. Also has urology follow up coming shortly, no new urinary concerns.   - Using dry erase boards at home to keep track of appointments and assist in memory, which has been very helpful.   - No longer on Ritalin. Takes occasionally naps during the daytime but has had no issues with arrousal. Sleeps well at night.   - Knee pain well controlled. Has not  talked to PCP about CCP findings on xray, but has no concerns at this time.   - No equipment needs. Has walker, tub seat, commode, wheelchair.   Pain Inventory Average Pain 0 Pain Right Now 0 My pain is  no pain  LOCATION OF PAIN  n/a  BOWEL Number of stools per week: 3-4 Oral laxative use Yes    BLADDER Normal Bladder incontinence No  Frequent urination Yes  Leakage with coughing No  Difficulty starting stream No  Incomplete bladder emptying Yes    Mobility walk with assistance use a walker ability to climb steps?  yes  Function retired I need assistance with the following:  dressing, bathing, toileting, meal prep, household duties, and shopping  Neuro/Psych bladder control problems weakness tremor trouble walking  Prior Studies Any changes since last visit?  no  Physicians involved in your care Any changes since last visit?  no   Family History  Problem Relation Age of Onset   Cancer Mother        Breast    Cancer Father        Lung    Colon cancer Neg Hx    Social History   Socioeconomic History   Marital status: Married    Spouse name: Not on file   Number of children: Not on file   Years of education: Not on file   Highest education level: Bachelor's degree (e.g., BA, AB, BS)  Occupational History   Not on file  Tobacco Use   Smoking status: Former  Smokeless tobacco: Never  Vaping Use   Vaping Use: Never used  Substance and Sexual Activity   Alcohol use: Yes    Alcohol/week: 0.0 standard drinks of alcohol    Comment: "Beer every once in a while"   Drug use: No   Sexual activity: Not Currently  Other Topics Concern   Not on file  Social History Narrative   Retired from [employer]   Married    Three children (Two in [place] and one in [place])    Social Determinants of Health   Financial Resource Strain: Low Risk  (11/23/2021)   Overall Financial Resource Strain (CARDIA)    Difficulty of Paying Living Expenses: Not hard at all   Food Insecurity: No Food Insecurity (11/23/2021)   Hunger Vital Sign    Worried About Running Out of Food in the Last Year: Never true    [place] in the Last Year: Never true  Transportation Needs: No Transportation Needs (11/23/2021)   PRAPARE - Hydrologist (Medical): No    Lack of Transportation (Non-Medical): No  Physical Activity: Insufficiently Active (11/23/2021)   Exercise Vital Sign    Days of Exercise per Week: 1 day    Minutes of Exercise per Session: 10 min  Stress: No Stress Concern Present (11/23/2021)   [place]    Feeling of Stress : Not at all  Social Connections: Unknown (11/23/2021)   Social Connection and Isolation Panel [NHANES]    Frequency of Communication with Friends and Family: Once a week    Frequency of Social Gatherings with Friends and Family: Patient refused    Attends Religious Services: 1 to 4 times per year    Active Member of [employer] or Organizations: No    Attends Music therapist: Patient refused    Marital Status: Married  Recent Concern: Social Connections - Socially Isolated (11/02/2021)   Social Connection and Isolation Panel [NHANES]    Frequency of Communication with Friends and Family: Once a week    Frequency of Social Gatherings with Friends and Family: Once a week    Attends Religious Services: Never    Marine scientist or Organizations: No    Attends Archivist Meetings: Patient refused    Marital Status: Married   Past Surgical History:  Procedure Laterality Date   CATARACT EXTRACTION Bilateral    CRANIOTOMY Bilateral 12/25/2021   Procedure: CRANIOTOMY HEMATOMA EVACUATION SUBDURAL;  Surgeon: Earnie Larsson, MD;  Location: [facility];  Service: Neurosurgery;  Laterality: Bilateral;   HERNIA REPAIR     x2   KIDNEY SURGERY Right    Had right kidney removed.    TONSILLECTOMY     VASECTOMY     Past Medical  History:  Diagnosis Date   Allergy    Basal cell carcinoma    GERD (gastroesophageal reflux disease)    History of kidney cancer    benign; told not cancerous   Hypertension    BP 91/63   Pulse 86   Ht 6' 6.5" (1.994 m)   Wt 222 lb 3.2 oz (100.8 kg)   SpO2 97%   BMI 25.35 kg/m   Opioid Risk Score:   Fall Risk Score:  `1  Depression screen PHQ 2/9     02/07/2022    1:35 PM 11/02/2021    1:30 PM 10/18/2021    8:57 AM 06/30/2020    9:45 AM  05/28/2019    7:31 AM 02/13/2018    7:48 AM 01/31/2017    9:21 AM  Depression screen PHQ 2/9  Decreased Interest 0 0 0 0 0 0 0  Down, Depressed, Hopeless 0 0 0 0 0 0 0  PHQ - 2 Score 0 0 0 0 0 0 0  Altered sleeping 0 0 0      Tired, decreased energy 0 0 0      Change in appetite 0 0 0      Feeling bad or failure about yourself  0 0 0      Trouble concentrating 0 0 0      Moving slowly or fidgety/restless 1 0 0      Suicidal thoughts 0 0 0      PHQ-9 Score 1 0 0      Difficult doing work/chores Not difficult at all Not difficult at all Not difficult at all         Review of Systems  Constitutional: Negative.   HENT: Negative.    Eyes: Negative.   Respiratory: Negative.    Cardiovascular: Negative.   Gastrointestinal:  Positive for constipation.  Endocrine: Negative.   Genitourinary:  Positive for difficulty urinating and frequency.  Musculoskeletal:  Positive for gait problem.  Skin: Negative.   Allergic/Immunologic: Negative.   Neurological:  Positive for tremors, weakness and headaches.  Hematological: Negative.   Psychiatric/Behavioral: Negative.        Objective:   Physical Exam  Constitutional: No apparent distress. Appropriate appearance for age.  HENT: No JVD. Neck Supple. Trachea midline. Atraumatic, normocephalic. Eyes: PERRLA. EOMI. Visual fields grossly intact.  Cardiovascular: RRR, no murmurs/rub/gallops. No Edema. Peripheral pulses 2+  Respiratory: CTAB. No rales, rhonchi, or wheezing. On RA.  Abdomen: + bowel  sounds, normoactive. No distention or tenderness.  Skin: C/D/I. No apparent lesions. MSK:      No apparent deformity.      Strength:                RUE: 5/5 SA, 5/5 EF, 5/5 EE, 5/5 WE, 5/5 FF, 5/5 FA                 LUE: 5/5 SA, 5/5 EF, 5/5 EE, 5/5 WE, 5/5 FF, 5/5 FA                 RLE: 5/5 HF, 5/5 KE, 5/5 DF, 5/5 EHL, 5/5 PF                 LLE:  5/5 HF, 5/5 KE, 5/5 DF, 5/5 EHL, 5/5 PF   Neurologic exam:  Cognition: AAO to person, place, time and event. Performed addition and multiplication correctly after cueing to slow down and carefully review responses. Intact to abstraction, comparison.  Language: Fluent, No substitutions or neoglisms. No dysarthria. Names 3/3 objects correctly.  Memory: Recalls 2/3 objects at 5 minutes. Mild delayed processing but much improved since rehab.  Insight: Good insight into current condition.  Mood: Pleasant affect, appropriate mood.  Sensation: To light touch intact in BL UEs and LEs  Reflexes: 2+ in BL UE and LEs. Negative Hoffman's and babinski signs bilaterally.  CN: 2-12 grossly intact.  Coordination: Moderate bilateral upper extremity intention tremors. No ataxia on FTN, HTS bilaterally.  Spasticity: MAS 0 in all extremities.  Gait: Good stance and stride with mild forward lean. Uses walker and wife CGA with gait belt for safety.          Assessment &  Plan:  Brandon [name] is a 76 y.o. right-handed male with history of hypertension, hyperlipidemia, prostate cancer who presents to PM&R clinic as in inpatient rehab transition of care after admission 8/19-9/12/23 following hospitalization for bilateral SDH s/p fall.  He is doing extremely well with home therapies and continues to make cognitive improvements, although remains mildly impulsive.  Traumatic brain injury with loss of consciousness, sequela [facility]) Assessment & Plan: D/t Bilateral SDH s/p falls, s/p burr hole 8/14 with Dr. Sandrea Hammond to be recovering well; continue home PT, OT,  and SLP services.   Follow up PRN    Gait difficulty Assessment & Plan: No equipment needs at this time, ambulates with walker and gait belt, has WC if needed  Continue home PT  Advised patient can discuss follow up with PCP for pseudogout joint aspiration testing if knee/joint pain, swelling, or difficulty moving re-occur.    Cognitive and neurobehavioral dysfunction following brain injury [facility]) Assessment & Plan: Continues to be mildly impulsive, but now with much clearer cognition and less delay than prior.  Wife advised to monitor closely, and patient advised to avoid falls from being impulsive with standing, transfering, etc.   No longer on Ritalin for arrousal; continue current medication regimen.        Gertie Gowda, DO 02/07/2022

## 2022-02-07 NOTE — Assessment & Plan Note (Signed)
No equipment needs at this time, ambulates with walker and gait belt, has WC if needed  Continue home PT  Advised patient can discuss follow up with PCP for pseudogout joint aspiration testing if knee/joint pain, swelling, or difficulty moving re-occur.

## 2022-02-08 DIAGNOSIS — C61 Malignant neoplasm of prostate: Secondary | ICD-10-CM | POA: Diagnosis not present

## 2022-02-08 DIAGNOSIS — R3915 Urgency of urination: Secondary | ICD-10-CM | POA: Diagnosis not present

## 2022-03-12 ENCOUNTER — Ambulatory Visit
Admission: RE | Admit: 2022-03-12 | Discharge: 2022-03-12 | Disposition: A | Discharge status: Home / Self Care | Payer: Medicare HMO | Source: Ambulatory Visit | Attending: Neurosurgery | Admitting: Neurosurgery

## 2022-03-12 DIAGNOSIS — I672 Cerebral atherosclerosis: Secondary | ICD-10-CM | POA: Diagnosis not present

## 2022-03-12 DIAGNOSIS — Z87828 Personal history of other (healed) physical injury and trauma: Secondary | ICD-10-CM | POA: Diagnosis not present

## 2022-03-12 DIAGNOSIS — I6200 Nontraumatic subdural hemorrhage, unspecified: Secondary | ICD-10-CM | POA: Diagnosis not present

## 2022-03-12 DIAGNOSIS — S065XAA Traumatic subdural hemorrhage with loss of consciousness status unknown, initial encounter: Secondary | ICD-10-CM

## 2022-03-13 NOTE — Progress Notes (Unsigned)
NEUROLOGY CONSULTATION NOTE  Brandon [name] MRN: [id number] DOB: 24-Apr-1946  Referring provider: Dorothyann Peng, NP Primary care provider: Dorothyann Peng, NP  Reason for consult:  gait instability  Assessment/Plan:   Traumatic brain injury Traumatic subdural hematoma status post craniotomy Peripheral neuropathy - idiopathic vs secondary to prediabetes Gait instability - appears to have underlying polyneuropathy that can affect balance.  The recurrent falls over the summer was likely due to the SDH sustained from the initial fall.   1  Continue physical therapy 2  Use assisted device to ambulate as directed 3  Will look for causes of polyneuropathy - ANA, B12, TSH, ACE, IFE.  Optimize glycemic control 4  Follow up 6 months.   Subjective:  Brandon [name] is a 76 year old male with HTN, prediabetes and history of basal cell carcinoma who presents for gait instability and falls.  History supplemented by his accompanying wife, the hospital records and referring provider's notes.   In June, he had a [place] sidewalk [place] [employer].  He did not lose consciousness but does not know why he fell.  He denied dizziness or leg weakness or numbness and did not remember tripping on anything.  He sustained a gash on his forehead.  Since then, he started feeling unsteady on his feet.  When sitting, he felt fine.  Throughout July, he had multiple falls and started using a cane.  On 8/13, he had a fall in which he hit the table striking his head.  He may have had brief loss of consciousness.  When he awoke, he was amnestic to the fall.  He was brought to [facility] were CT head revealed large bilateral subacute subdural hematomas with mass effect.  He underwent bilateral bur hole evacuation and subsequently underwent inpatient rehab.  Following discharge, he had been receiving home PT/OT and speech therapy.  He has dramatically improved.  Cognition is better and he is able to  participate in crossword puzzles.  He is using either a cane or walker when ambulating.  Denies neck pain, back pain, leg weakness and numbness, tremor, double vision, hallucinations, trouble swallowing or speaking.  He is hard of hearing.     Imaging (personally reviewed) 12/24/2021 CT HEAD WO:  1. Large acute on subacute to chronic bilateral subdural hematomas, measuring up to 2.5 cm on the right and 1.5 cm on the left.  Extension along the falx and tentorium. Associated mass effect with up to 5 mm of right-to-left shift.  2. Soft tissue contusion with laceration at the left occipital scalp. No calvarial fracture.  3. Underlying mild chronic microvascular ischemic disease. 12/24/2021 CT C-SPINE:  1. No acute traumatic injury within the cervical spine.  2. Moderate degenerative spondylosis C3-4 through C7-T1.  3. Small focus of ground-glass opacity at the mid right lung apex, nonspecific, but possibly mild pneumonitis. 01/04/2022 CT HEAD WO:  1. Interval removal of bilateral extra-axial drains. 2. Interval decrease in size of right extra-axial collection and resolution of midline shift. 3. The left-sided extra-axial fluid is more prominent than on the prior exam. This likely represents a shift of brain away from the skull rather than any positive pressure. 4. Residual hyperdense blood products are noted posteriorly on the right, less hyperintense than on the prior exam. This represents expected evolution of the existing hemorrhage. 5. No new or progressive hemorrhage. 6. Atherosclerosis.  PAST MEDICAL HISTORY: Past Medical History:  Diagnosis Date   Allergy    Basal  cell carcinoma    GERD (gastroesophageal reflux disease)    History of kidney cancer    benign; told not cancerous   Hypertension     PAST SURGICAL HISTORY: Past Surgical History:  Procedure Laterality Date   CATARACT EXTRACTION Bilateral    CRANIOTOMY Bilateral 12/25/2021   Procedure: CRANIOTOMY HEMATOMA EVACUATION SUBDURAL;   Surgeon: Earnie Larsson, MD;  Location: [facility];  Service: Neurosurgery;  Laterality: Bilateral;   HERNIA REPAIR     x2   KIDNEY SURGERY Right    Had right kidney removed.    TONSILLECTOMY     VASECTOMY      MEDICATIONS: Current Outpatient Medications on File Prior to Visit  Medication Sig Dispense Refill   acetaminophen (TYLENOL) 500 MG tablet Take 1 tablet (500 mg total) by mouth 2 (two) times daily as needed for mild pain. 30 tablet 0   atorvastatin (LIPITOR) 20 MG tablet Take 1 tablet (20 mg total) by mouth daily. 30 tablet 0   B Complex Vitamins (VITAMIN B COMPLEX) TABS Take by mouth daily. 30 tablet 0   butalbital-acetaminophen-caffeine (FIORICET) 50-325-40 MG tablet Take 1 tablet by mouth every 6 (six) hours as needed for headache. 10 tablet 0   cetirizine (ZYRTEC) 10 MG tablet Take 10 mg by mouth daily.     Cholecalciferol 25 MCG (1000 UT) tablet Take 1 tablet (1,000 Units total) by mouth daily. 30 tablet 0   diclofenac Sodium (VOLTAREN) 1 % GEL Apply 4 g topically 3 (three) times daily. 100 g 0   finasteride (PROSCAR) 5 MG tablet Take 1 tablet (5 mg total) by mouth daily. 90 tablet 0   latanoprost (XALATAN) 0.005 % ophthalmic solution Place 1 drop into both eyes at bedtime.     lisinopril-hydrochlorothiazide (ZESTORETIC) 20-25 MG tablet Take 1 tablet by mouth daily. 30 tablet 0   methylphenidate (RITALIN) 5 MG tablet Take 1 tablet (5 mg total) by mouth daily with breakfast. 30 tablet 0   pantoprazole (PROTONIX) 40 MG tablet Take 1 tablet (40 mg total) by mouth at bedtime. 30 tablet 0   tamsulosin (FLOMAX) 0.4 MG CAPS capsule Take 1 capsule (0.4 mg total) by mouth 2 (two) times daily. 180 capsule 0   traZODone (DESYREL) 100 MG tablet Take 1 tablet (100 mg total) by mouth at bedtime. 30 tablet 0   vitamin C (ASCORBIC ACID) 500 MG tablet Take 500 mg by mouth daily.     No current facility-administered medications on file prior to visit.    ALLERGIES: No Known Allergies  FAMILY  HISTORY: Family History  Problem Relation Age of Onset   Cancer Mother        Breast    Cancer Father        Lung    Colon cancer Neg Hx     Objective:  Blood pressure 125/86, pulse 84, height '6\' 7"'$  (2.007 m), weight 229 lb (103.9 kg), SpO2 94 %. General: No acute distress.  Patient appears well-groomed.   Head:  Normocephalic/atraumatic Eyes:  fundi examined but not visualized Neck: supple, no paraspinal tenderness, full range of motion Back: No paraspinal tenderness Heart: regular rate and rhythm Lungs: Clear to auscultation bilaterally. Vascular: No carotid bruits. Neurological Exam: Mental status: alert and oriented to person, place, and time, speech fluent and not dysarthric, language intact. Cranial nerves: CN I: not tested CN II: pupils equal, round and reactive to light, visual fields intact CN III, IV, VI:  full range of motion, no nystagmus, no ptosis CN  V: facial sensation intact. CN VII: upper and lower face symmetric CN VIII: hearing reduced bilaterally CN IX, X: gag intact, uvula midline CN XI: sternocleidomastoid and trapezius muscles intact CN XII: tongue midline Bulk & Tone: normal, no fasciculations or rigidity. Motor:  muscle strength 5/5 throughout.  No tremor or bradykinesia Sensation:  Pinprick and vibratory sensation reduced in feet. Deep Tendon Reflexes:  1+ throughout except absent in ankles,  Hoffman sign absent, toes equivocal  Finger to nose testing:  Without dysmetria.   Heel to shin:  Without dysmetria.   Gait:  Slightly unsteady gait with good stride.  Romberg with very mild sway.      Thank you for allowing me to take part in the care of this patient.  Metta Clines, DO  CC: Dorothyann Peng, NP

## 2022-03-14 ENCOUNTER — Other Ambulatory Visit (INDEPENDENT_AMBULATORY_CARE_PROVIDER_SITE_OTHER): Payer: Medicare HMO

## 2022-03-14 ENCOUNTER — Ambulatory Visit: Payer: Medicare HMO | Admitting: Neurology

## 2022-03-14 ENCOUNTER — Encounter: Payer: Self-pay | Admitting: Neurology

## 2022-03-14 VITALS — BP 125/86 | HR 84 | Ht 79.0 in | Wt 229.0 lb

## 2022-03-14 DIAGNOSIS — S065XAA Traumatic subdural hemorrhage with loss of consciousness status unknown, initial encounter: Secondary | ICD-10-CM | POA: Diagnosis not present

## 2022-03-14 DIAGNOSIS — G6289 Other specified polyneuropathies: Secondary | ICD-10-CM

## 2022-03-14 DIAGNOSIS — S069X9S Unspecified intracranial injury with loss of consciousness of unspecified duration, sequela: Secondary | ICD-10-CM

## 2022-03-14 LAB — TSH: TSH: 1.27 u[IU]/mL (ref 0.35–5.50)

## 2022-03-14 LAB — VITAMIN B12: Vitamin B-12: 623 pg/mL (ref 211–911)

## 2022-03-14 NOTE — Patient Instructions (Signed)
You do have evidence of neuropathy in the feet that can affect your balance.  It may be due to pre-diabetes but we should look for other causes - will check ANA with reflex, B12, ACE, IFE, TSH.  I think the recurrent falls over the summer was due to having the bleeding in the brain from the initial fall.

## 2022-03-16 LAB — ANGIOTENSIN CONVERTING ENZYME: Angiotensin-Converting Enzyme: <5 U/L — ABNORMAL LOW (ref 9–67)

## 2022-03-20 LAB — IMMUNOFIXATION, SERUM
IgA/Immunoglobulin A, Serum: 188 mg/dL (ref 61–437)
IgG (Immunoglobin G), Serum: 1170 mg/dL (ref 603–1613)
IgM (Immunoglobulin M), Srm: 80 mg/dL (ref 15–143)

## 2022-03-20 LAB — ANA W/REFLEX: Anti Nuclear Antibody (ANA): NEGATIVE

## 2022-03-21 DIAGNOSIS — R339 Retention of urine, unspecified: Secondary | ICD-10-CM | POA: Diagnosis not present

## 2022-03-21 DIAGNOSIS — R109 Unspecified abdominal pain: Secondary | ICD-10-CM | POA: Diagnosis not present

## 2022-03-21 NOTE — Progress Notes (Signed)
Tried calling patient, no answer, LMOVM to call the office back.

## 2022-04-26 ENCOUNTER — Other Ambulatory Visit: Payer: Self-pay

## 2022-04-26 ENCOUNTER — Emergency Department (HOSPITAL_COMMUNITY)
Admission: EM | Admit: 2022-04-26 | Discharge: 2022-04-26 | Disposition: A | Discharge status: Home / Self Care | Payer: Medicare HMO | Attending: Emergency Medicine | Admitting: Emergency Medicine

## 2022-04-26 ENCOUNTER — Emergency Department (HOSPITAL_COMMUNITY): Payer: Medicare HMO

## 2022-04-26 DIAGNOSIS — J984 Other disorders of lung: Secondary | ICD-10-CM | POA: Diagnosis not present

## 2022-04-26 DIAGNOSIS — I672 Cerebral atherosclerosis: Secondary | ICD-10-CM | POA: Diagnosis not present

## 2022-04-26 DIAGNOSIS — R911 Solitary pulmonary nodule: Secondary | ICD-10-CM | POA: Insufficient documentation

## 2022-04-26 DIAGNOSIS — S065XAA Traumatic subdural hemorrhage with loss of consciousness status unknown, initial encounter: Secondary | ICD-10-CM

## 2022-04-26 DIAGNOSIS — Z79899 Other long term (current) drug therapy: Secondary | ICD-10-CM | POA: Insufficient documentation

## 2022-04-26 DIAGNOSIS — I10 Essential (primary) hypertension: Secondary | ICD-10-CM | POA: Insufficient documentation

## 2022-04-26 DIAGNOSIS — I6203 Nontraumatic chronic subdural hemorrhage: Secondary | ICD-10-CM | POA: Diagnosis not present

## 2022-04-26 DIAGNOSIS — R2681 Unsteadiness on feet: Secondary | ICD-10-CM | POA: Insufficient documentation

## 2022-04-26 DIAGNOSIS — S0990XA Unspecified injury of head, initial encounter: Secondary | ICD-10-CM | POA: Diagnosis present

## 2022-04-26 DIAGNOSIS — S065X0A Traumatic subdural hemorrhage without loss of consciousness, initial encounter: Secondary | ICD-10-CM | POA: Diagnosis not present

## 2022-04-26 DIAGNOSIS — I6201 Nontraumatic acute subdural hemorrhage: Secondary | ICD-10-CM | POA: Diagnosis not present

## 2022-04-26 DIAGNOSIS — S199XXA Unspecified injury of neck, initial encounter: Secondary | ICD-10-CM | POA: Diagnosis not present

## 2022-04-26 DIAGNOSIS — M47812 Spondylosis without myelopathy or radiculopathy, cervical region: Secondary | ICD-10-CM | POA: Diagnosis not present

## 2022-04-26 DIAGNOSIS — W19XXXA Unspecified fall, initial encounter: Secondary | ICD-10-CM | POA: Diagnosis not present

## 2022-04-26 LAB — COMPREHENSIVE METABOLIC PANEL
ALT: 14 U/L (ref 0–44)
AST: 17 U/L (ref 15–41)
Albumin: 3.4 g/dL — ABNORMAL LOW (ref 3.5–5.0)
Alkaline Phosphatase: 51 U/L (ref 38–126)
Anion gap: 8 (ref 5–15)
BUN: 14 mg/dL (ref 8–23)
CO2: 30 mmol/L (ref 22–32)
Calcium: 9.1 mg/dL (ref 8.9–10.3)
Chloride: 103 mmol/L (ref 98–111)
Creatinine, Ser: 1.06 mg/dL (ref 0.61–1.24)
GFR, Estimated: >60 mL/min (ref >60)
Glucose, Bld: 159 mg/dL — ABNORMAL HIGH (ref 70–99)
Potassium: 3.4 mmol/L — ABNORMAL LOW (ref 3.5–5.1)
Sodium: 141 mmol/L (ref 135–145)
Total Bilirubin: 0.9 mg/dL (ref 0.3–1.2)
Total Protein: 6.5 g/dL (ref 6.5–8.1)

## 2022-04-26 LAB — CBC WITH DIFFERENTIAL/PLATELET
Abs Immature Granulocytes: 0.02 10*3/uL (ref 0.00–0.07)
Basophils Absolute: 0.1 10*3/uL (ref 0.0–0.1)
Basophils Relative: 1 %
Eosinophils Absolute: 0.1 10*3/uL (ref 0.0–0.5)
Eosinophils Relative: 1 %
HCT: 42.8 % (ref 39.0–52.0)
Hemoglobin: 14.5 g/dL (ref 13.0–17.0)
Immature Granulocytes: 0 %
Lymphocytes Relative: 19 %
Lymphs Abs: 1.1 10*3/uL (ref 0.7–4.0)
MCH: 29.8 pg (ref 26.0–34.0)
MCHC: 33.9 g/dL (ref 30.0–36.0)
MCV: 87.9 fL (ref 80.0–100.0)
Monocytes Absolute: 0.4 10*3/uL (ref 0.1–1.0)
Monocytes Relative: 7 %
Neutro Abs: 4 10*3/uL (ref 1.7–7.7)
Neutrophils Relative %: 72 %
Platelets: 133 10*3/uL — ABNORMAL LOW (ref 150–400)
RBC: 4.87 MIL/uL (ref 4.22–5.81)
RDW: 12.9 % (ref 11.5–15.5)
WBC: 5.6 10*3/uL (ref 4.0–10.5)
nRBC: 0 % (ref 0.0–0.2)

## 2022-04-26 LAB — URINALYSIS, ROUTINE W REFLEX MICROSCOPIC
Bilirubin Urine: NEGATIVE
Glucose, UA: NEGATIVE mg/dL
Hgb urine dipstick: NEGATIVE
Ketones, ur: NEGATIVE mg/dL
Leukocytes,Ua: NEGATIVE
Nitrite: NEGATIVE
Protein, ur: NEGATIVE mg/dL
Specific Gravity, Urine: 1.018 (ref 1.005–1.030)
pH: 5 (ref 5.0–8.0)

## 2022-04-26 NOTE — ED Triage Notes (Signed)
Wife stated, he was going to [facility] and fell, for the last 2 days he has been woobly . He has a history of falls and EMS recommended we come here to be checked out.

## 2022-04-26 NOTE — ED Notes (Signed)
AVS reviewed with pt and family prior to discharge. Pt and family verbalize understanding. Belongings with pt prior to depart. Pt in wheelchair to POV with family.

## 2022-04-26 NOTE — Discharge Instructions (Signed)
Your history, exam, and workup today revealed acute on chronic subdural hematomas that appear stable over the 14 hours you have been here.  Neurosurgery feel you are safe for discharge home with close follow-up in the next week.  Incidentally, we did find a lung nodule that has grown over the last 5 years which she need to follow-up with your primary doctor about.  Please rest and stay hydrated.  If any symptoms change or worsen acutely, please return to the nearest emergency department.

## 2022-04-26 NOTE — ED Provider Notes (Signed)
[facility] EMERGENCY DEPARTMENT Provider Note   CSN: [id number] Arrival date & time: 04/26/22  1660     History  Chief Complaint  Patient presents with   Lytle Michaels    Brandon [name] is a 76 y.o. male.  The history is provided by the patient, medical records, a relative and a significant other. No language interpreter was used.  Fall This is a recurrent problem. The current episode started 12 to 24 hours ago. The problem occurs rarely. The problem has not changed since onset.Pertinent negatives include no chest pain, no abdominal pain, no headaches and no shortness of breath. Nothing aggravates the symptoms. Nothing relieves the symptoms. He has tried nothing for the symptoms. The treatment provided no relief.       Home Medications Prior to Admission medications   Medication Sig Start Date End Date Taking? Authorizing Provider  acetaminophen (TYLENOL) 500 MG tablet Take 1 tablet (500 mg total) by mouth 2 (two) times daily as needed for mild pain. 01/22/22   Angiulli, Lavon Paganini, PA-C  B Complex Vitamins (VITAMIN B COMPLEX) TABS Take by mouth daily. 01/22/22   Angiulli, Lavon Paganini, PA-C  butalbital-acetaminophen-caffeine (FIORICET) [phone] MG tablet Take 1 tablet by mouth every 6 (six) hours as needed for headache. 01/22/22   Angiulli, Lavon Paganini, PA-C  cetirizine (ZYRTEC) 10 MG tablet Take 10 mg by mouth daily.    [provider]  Cholecalciferol 25 MCG (1000 UT) tablet Take 1 tablet (1,000 Units total) by mouth daily. 01/22/22   Angiulli, Lavon Paganini, PA-C  diclofenac Sodium (VOLTAREN) 1 % GEL Apply 4 g topically 3 (three) times daily. 01/22/22   Angiulli, Lavon Paganini, PA-C  finasteride (PROSCAR) 5 MG tablet Take 1 tablet (5 mg total) by mouth daily. 01/26/22   Nafziger, Tommi Rumps, NP  latanoprost (XALATAN) 0.005 % ophthalmic solution Place 1 drop into both eyes at bedtime. 10/02/21   [provider]  lisinopril-hydrochlorothiazide (ZESTORETIC) 20-25 MG tablet Take 1  tablet by mouth daily. 01/22/22   Angiulli, Lavon Paganini, PA-C  tamsulosin (FLOMAX) 0.4 MG CAPS capsule Take 1 capsule (0.4 mg total) by mouth 2 (two) times daily. 01/26/22 04/26/22  Nafziger, Tommi Rumps, NP  traZODone (DESYREL) 100 MG tablet Take 1 tablet (100 mg total) by mouth at bedtime. 01/22/22   Angiulli, Lavon Paganini, PA-C  vitamin C (ASCORBIC ACID) 500 MG tablet Take 500 mg by mouth daily. 12/20/16   [provider]      Allergies    Patient has no known allergies.    Review of Systems   Review of Systems  Constitutional:  Negative for chills, fatigue and fever.  HENT:  Negative for congestion.   Respiratory:  Negative for apnea, cough, chest tightness, shortness of breath and wheezing.   Cardiovascular:  Negative for chest pain, palpitations and leg swelling.  Gastrointestinal:  Negative for abdominal distention, abdominal pain, constipation, diarrhea, nausea and vomiting.  Genitourinary:  Negative for dysuria and flank pain.  Musculoskeletal:  Positive for gait problem. Negative for back pain, neck pain and neck stiffness.  Skin:  Negative for rash and wound.  Neurological:  Negative for dizziness, weakness, light-headedness, numbness and headaches.  Psychiatric/Behavioral:  Negative for agitation and confusion.   All other systems reviewed and are negative.   Physical Exam Updated Vital Signs BP (!) 145/79 (BP Location: Right Arm)   Pulse 69   Temp 97.9 F (36.6 C) (Oral)   Resp 19   SpO2 97%  Physical Exam Vitals and  nursing note reviewed.  Constitutional:      General: He is not in acute distress.    Appearance: He is well-developed. He is not ill-appearing, toxic-appearing or diaphoretic.  HENT:     Head: Normocephalic and atraumatic.     Nose: Nose normal.     Mouth/Throat:     Mouth: Mucous membranes are moist.  Eyes:     Extraocular Movements: Extraocular movements intact.     Conjunctiva/sclera: Conjunctivae normal.     Pupils: Pupils are equal, round, and  reactive to light.  Cardiovascular:     Rate and Rhythm: Normal rate and regular rhythm.     Heart sounds: No murmur heard. Pulmonary:     Effort: Pulmonary effort is normal. No respiratory distress.     Breath sounds: Normal breath sounds. No wheezing, rhonchi or rales.  Chest:     Chest wall: No tenderness.  Abdominal:     General: Abdomen is flat.     Palpations: Abdomen is soft.     Tenderness: There is no abdominal tenderness. There is no right CVA tenderness, left CVA tenderness, guarding or rebound.  Musculoskeletal:        General: No swelling or tenderness.     Cervical back: Neck supple. No tenderness.     Right lower leg: No edema.     Left lower leg: No edema.  Skin:    General: Skin is warm and dry.     Capillary Refill: Capillary refill takes less than 2 seconds.     Findings: No erythema or rash.  Neurological:     Mental Status: He is alert.     Sensory: No sensory deficit.     Motor: No weakness.     Gait: Gait abnormal (unsteady).  Psychiatric:        Mood and Affect: Mood normal.     ED Results / Procedures / Treatments   Labs (all labs ordered are listed, but only abnormal results are displayed) Labs Reviewed  CBC WITH DIFFERENTIAL/PLATELET - Abnormal; Notable for the following components:      Result Value   Platelets 133 (*)    All other components within normal limits  COMPREHENSIVE METABOLIC PANEL - Abnormal; Notable for the following components:   Potassium 3.4 (*)    Glucose, Bld 159 (*)    Albumin 3.4 (*)    All other components within normal limits  URINALYSIS, ROUTINE W REFLEX MICROSCOPIC    EKG EKG Interpretation  Date/Time:  Thursday April 26 2022 08:02:10 EST Ventricular Rate:  71 PR Interval:  182 QRS Duration: 108 QT Interval:  432 QTC Calculation: 469 R Axis:   41 Text Interpretation: Normal sinus rhythm Normal ECG When compared with ECG of 24-Dec-2021 20:34, PREVIOUS ECG IS PRESENT when comapred to prior, similar  appearance with less PVC No STEMI Confirmed by Antony Blackbird [phone]) on 04/26/2022 5:17:57 PM  Radiology CT Head Wo Contrast  Result Date: 04/26/2022 CLINICAL DATA:  Head trauma EXAM: CT HEAD WITHOUT CONTRAST TECHNIQUE: Contiguous axial images were obtained from the base of the skull through the vertex without intravenous contrast. RADIATION DOSE REDUCTION: This exam was performed according to the departmental dose-optimization program which includes automated exposure control, adjustment of the mA and/or kV according to patient size and/or use of iterative reconstruction technique. COMPARISON:  03/12/2022 FINDINGS: Brain: Acute on chronic subdural hematoma is seen within the left frontal region, with the acute blood products measuring up to 3 mm in thickness. Total thickness of  chronic component of the subdural hematoma measures 7 mm. Chronic right-sided subdural hematoma is again noted. There are scattered areas of increased attenuation within this chronic subdural hematoma, which could reflect calcification or small foci of acute hemorrhage. Maximal thickness of the right-sided chronic subdural hematoma measures 7 mm. No acute infarct. Lateral ventricles and midline structures are stable. There is no mass effect or midline shift. Vascular: Stable atherosclerosis.  No hyperdense vessel. Skull: Stable bilateral burr holes along the frontal parietal convexity. No acute fracture. Sinuses/Orbits: No acute finding. Other: None. IMPRESSION: 1. Acute on chronic left frontal subdural hematoma, with acute blood products measuring approximately 3 mm in thickness. Maximal thickness of the chronic component of the subdural hematoma measures 7 mm. No mass effect or midline shift. 2. Chronic right subdural hematoma, with scattered areas of increased attenuation consistent with calcification or small foci of acute hemorrhage. Maximal thickness measures 7 mm. No mass effect or midline shift. 3. No acute infarct. Critical  Value/emergent results were called by telephone at the time of interpretation on 04/26/2022 at 3:08pm to provider AMJAD ALI , who verbally acknowledged these results. Electronically Signed   By: Randa Ngo M.D.   On: 04/26/2022 15:13    Procedures Procedures    Medications Ordered in ED Medications - No data to display  ED Course/ Medical Decision Making/ A&P Clinical Course as of 04/27/22 0002  Thu Apr 26, 2022  1535 CT Head Wo Contrast [CT]    Clinical Course User Index [CT] Amani Marseille, Gwenyth Allegra, MD                           Medical Decision Making Amount and/or Complexity of Data Reviewed Labs: ordered. Radiology: ordered. Decision-making details documented in ED Course.    Brandon [name] is a 76 y.o. male with a past medical history significant for GERD, hypertension, and previous subdural hematomas status post bilateral craniotomy on 12/25/2021 who presents with 2 days of unsteadiness compared to baseline and a fall today.  According to patient and family, for the last 2 days he has felt more unsteady than his baseline.  He is unsure if he hit his head right before it worsened overnight but he did have a fall today in the shower.  He likely did hit his head but denying headache.  Denies vision changes, nausea, or vomiting.  Denies any focal numbness or weakness reports he feels more unsteady than he has been.  He denies any headache but is having some mild neck pain.  Denies any chest pain, palpitations, shortness of breath, constipation, diarrhea, and reports he chronically has urinary frequency that is not different than baseline.  On exam, he was unsteady on his feet.  Lungs clear and chest nontender.  Abdomen nontender.  No carotid bruit appreciated on initial exam.  Have some left-sided muscle tenderness on the neck but was nontender in the midline.  Intact sensation and strength in extremities.  Finger-nose-finger testing was symmetrically slightly ataxic.  He is unsure if  this is new or not.  Pupils are symmetric and reactive, extract movements.  Clear speech.  No evidence of acute trauma otherwise.  Patient was seen in triage and had CT head.  CT head shows 3 mm of new subdural on the left frontal side and then possible areas of acute on chronic subdural on the right.  No mass or midline shift reported.  Due to his history of subdurals requiring intervention several  months ago and his new unsteadiness with this bleeding, will call neurosurgery to discuss the plan.  Will add on a CT of cervical spine due to the neck tenderness after the fall.  5:59 PM Confirmed with one of the neurosurgeons that 4 hours is a reasonable timeline for repeat head imaging.  Will have his image collected at around 7 to 7:30 PM and I informed the CT team about this time.  CT head and neck did not show any worsening of bleeding and did not show acute cervical fracture.  Given the stability he will be discharged and follow-up with outpatient neurosurgery as we discussed.  CT neck did show evidence of enlarging pulmonary nodule which patient was informed.  We discussed getting more imaging today but they would prefer to follow-up as an outpatient to discuss with PCP further workup.  This is very reasonable given his stability with neurologic symptoms.  Patient discharged in good condition for outpatient neurosurgery follow-up for the acute on chronic subdural hematomas.          Final Clinical Impression(s) / ED Diagnoses Final diagnoses:  Subdural hematoma ([place])  Lung nodule  Fall, initial encounter  Unsteadiness    Rx / DC Orders ED Discharge Orders     None       Clinical Impression: 1. Subdural hematoma ([facility])   2. Lung nodule   3. Fall, initial encounter   4. Unsteadiness     Disposition: Discharge  Condition: Good  I have discussed the results, Dx and Tx plan with the pt(& family if present). He/she/they expressed understanding and agree(s) with the plan.  Discharge instructions discussed at great length. Strict return precautions discussed and pt &/or family have verbalized understanding of the instructions. No further questions at time of discharge.    Discharge Medication List as of 04/26/2022  9:24 PM      Follow Up: Dorothyann Peng, NP [place] [address] [phone]     Earnie Larsson, [place] [address] [place] [address] [phone]  Schedule an appointment as soon as possible for a visit in 1 week 1 week      Yvonnie Schinke, Gwenyth Allegra, MD 04/27/22 0009

## 2022-04-27 NOTE — ED Provider Notes (Incomplete)
[facility] EMERGENCY DEPARTMENT Provider Note   CSN: [id number] Arrival date & time: 04/26/22  3016     History {Add pertinent medical, surgical, social history, OB history to HPI:1} Chief Complaint  Patient presents with  . Fall    HEZAKIAH CHAMPEAU is a 76 y.o. male.   Fall       Home Medications Prior to Admission medications   Medication Sig Start Date End Date Taking? Authorizing Provider  acetaminophen (TYLENOL) 500 MG tablet Take 1 tablet (500 mg total) by mouth 2 (two) times daily as needed for mild pain. 01/22/22   Angiulli, Lavon Paganini, PA-C  B Complex Vitamins (VITAMIN B COMPLEX) TABS Take by mouth daily. 01/22/22   Angiulli, Lavon Paganini, PA-C  butalbital-acetaminophen-caffeine (FIORICET) 3252900759 MG tablet Take 1 tablet by mouth every 6 (six) hours as needed for headache. 01/22/22   Angiulli, Lavon Paganini, PA-C  cetirizine (ZYRTEC) 10 MG tablet Take 10 mg by mouth daily.    [provider]  Cholecalciferol 25 MCG (1000 UT) tablet Take 1 tablet (1,000 Units total) by mouth daily. 01/22/22   Angiulli, Lavon Paganini, PA-C  diclofenac Sodium (VOLTAREN) 1 % GEL Apply 4 g topically 3 (three) times daily. 01/22/22   Angiulli, Lavon Paganini, PA-C  finasteride (PROSCAR) 5 MG tablet Take 1 tablet (5 mg total) by mouth daily. 01/26/22   Nafziger, Tommi Rumps, NP  latanoprost (XALATAN) 0.005 % ophthalmic solution Place 1 drop into both eyes at bedtime. 10/02/21   [provider]  lisinopril-hydrochlorothiazide (ZESTORETIC) 20-25 MG tablet Take 1 tablet by mouth daily. 01/22/22   Angiulli, Lavon Paganini, PA-C  tamsulosin (FLOMAX) 0.4 MG CAPS capsule Take 1 capsule (0.4 mg total) by mouth 2 (two) times daily. 01/26/22 04/26/22  Nafziger, Tommi Rumps, NP  traZODone (DESYREL) 100 MG tablet Take 1 tablet (100 mg total) by mouth at bedtime. 01/22/22   Angiulli, Lavon Paganini, PA-C  vitamin C (ASCORBIC ACID) 500 MG tablet Take 500 mg by mouth daily. 12/20/16   [provider]      Allergies     Patient has no known allergies.    Review of Systems   Review of Systems  Physical Exam Updated Vital Signs BP (!) 145/79 (BP Location: Right Arm)   Pulse 69   Temp 97.9 F (36.6 C) (Oral)   Resp 19   SpO2 97%  Physical Exam  ED Results / Procedures / Treatments   Labs (all labs ordered are listed, but only abnormal results are displayed) Labs Reviewed  CBC WITH DIFFERENTIAL/PLATELET - Abnormal; Notable for the following components:      Result Value   Platelets 133 (*)    All other components within normal limits  COMPREHENSIVE METABOLIC PANEL - Abnormal; Notable for the following components:   Potassium 3.4 (*)    Glucose, Bld 159 (*)    Albumin 3.4 (*)    All other components within normal limits  URINALYSIS, ROUTINE W REFLEX MICROSCOPIC    EKG EKG Interpretation  Date/Time:  Thursday April 26 2022 08:02:10 EST Ventricular Rate:  71 PR Interval:  182 QRS Duration: 108 QT Interval:  432 QTC Calculation: 469 R Axis:   41 Text Interpretation: Normal sinus rhythm Normal ECG When compared with ECG of 24-Dec-2021 20:34, PREVIOUS ECG IS PRESENT when comapred to prior, similar appearance with less PVC No STEMI Confirmed by Antony Blackbird [id number]) on 04/26/2022 5:17:57 PM  Radiology CT Head Wo Contrast  Result Date: 04/26/2022 CLINICAL DATA:  Head trauma EXAM:  CT HEAD WITHOUT CONTRAST TECHNIQUE: Contiguous axial images were obtained from the base of the skull through the vertex without intravenous contrast. RADIATION DOSE REDUCTION: This exam was performed according to the departmental dose-optimization program which includes automated exposure control, adjustment of the mA and/or kV according to patient size and/or use of iterative reconstruction technique. COMPARISON:  03/12/2022 FINDINGS: Brain: Acute on chronic subdural hematoma is seen within the left frontal region, with the acute blood products measuring up to 3 mm in thickness. Total thickness of chronic component  of the subdural hematoma measures 7 mm. Chronic right-sided subdural hematoma is again noted. There are scattered areas of increased attenuation within this chronic subdural hematoma, which could reflect calcification or small foci of acute hemorrhage. Maximal thickness of the right-sided chronic subdural hematoma measures 7 mm. No acute infarct. Lateral ventricles and midline structures are stable. There is no mass effect or midline shift. Vascular: Stable atherosclerosis.  No hyperdense vessel. Skull: Stable bilateral burr holes along the frontal parietal convexity. No acute fracture. Sinuses/Orbits: No acute finding. Other: None. IMPRESSION: 1. Acute on chronic left frontal subdural hematoma, with acute blood products measuring approximately 3 mm in thickness. Maximal thickness of the chronic component of the subdural hematoma measures 7 mm. No mass effect or midline shift. 2. Chronic right subdural hematoma, with scattered areas of increased attenuation consistent with calcification or small foci of acute hemorrhage. Maximal thickness measures 7 mm. No mass effect or midline shift. 3. No acute infarct. Critical Value/emergent results were called by telephone at the time of interpretation on 04/26/2022 at 3:08pm to provider AMJAD ALI , who verbally acknowledged these results. Electronically Signed   By: Randa Ngo M.D.   On: 04/26/2022 15:13    Procedures Procedures  {Document cardiac monitor, telemetry assessment procedure when appropriate:1}  Medications Ordered in ED Medications - No data to display  ED Course/ Medical Decision Making/ A&P Clinical Course as of 04/27/22 0002  Thu Apr 26, 2022  1535 CT Head Wo Contrast [CT]    Clinical Course User Index [CT] Alcario Tinkey, Gwenyth Allegra, MD                           Medical Decision Making Amount and/or Complexity of Data Reviewed Labs: ordered. Radiology: ordered. Decision-making details documented in ED Course.    CLEVESTER HELZER is a  76 y.o. male with a past medical history significant for GERD, hypertension, and previous subdural hematomas status post bilateral craniotomy on 12/25/2021 who presents with 2 days of unsteadiness compared to baseline and a fall today.  According to patient and family, for the last 2 days he has felt more unsteady than his baseline.  He is unsure if he hit his head right before it worsened overnight but he did have a fall today in the shower.  He likely did hit his head but denying headache.  Denies vision changes, nausea, or vomiting.  Denies any focal numbness or weakness reports he feels more unsteady than he has been.  He denies any headache but is having some mild neck pain.  Denies any chest pain, palpitations, shortness of breath, constipation, diarrhea, and reports he chronically has urinary frequency that is not different than baseline.  On exam, he was unsteady on his feet.  Lungs clear and chest nontender.  Abdomen nontender.  No carotid bruit appreciated on initial exam.  Have some left-sided muscle tenderness on the neck but was nontender in the midline.  Intact sensation and strength in extremities.  Finger-nose-finger testing was symmetrically slightly ataxic.  He is unsure if this is new or not.  Pupils are symmetric and reactive, extract movements.  Clear speech.  No evidence of acute trauma otherwise.  Patient was seen in triage and had CT head.  CT head shows 3 mm of new subdural on the left frontal side and then possible areas of acute on chronic subdural on the right.  No mass or midline shift reported.  Due to his history of subdurals requiring intervention several months ago and his new unsteadiness with this bleeding, will call neurosurgery to discuss the plan.  Will add on a CT of cervical spine due to the neck tenderness after the fall.  5:59 PM Confirmed with one of the neurosurgeons that 4 hours is a reasonable timeline for repeat head imaging.  Will have his image collected at  around 7 to 7:30 PM and I informed the CT team about this time.  CT head and neck did not show any worsening of bleeding and did not show acute cervical fracture.  Given the stability he will be discharged and follow-up with outpatient neurosurgery as we discussed.  CT neck did show evidence of enlarging pulmonary nodule which patient was informed.  We discussed getting more imaging today but they would prefer to follow-up as an outpatient to discuss with PCP further workup.  This is very reasonable given his stability with neurologic symptoms.  Patient discharged in good condition for outpatient neurosurgery follow-up for the acute on chronic subdural hematomas.    {Document critical care time when appropriate:1} {Document review of labs and clinical decision tools ie heart score, Chads2Vasc2 etc:1}  {Document your independent review of radiology images, and any outside records:1} {Document your discussion with family members, caretakers, and with consultants:1} {Document social determinants of health affecting pt's care:1} {Document your decision making why or why not admission, treatments were needed:1} Final Clinical Impression(s) / ED Diagnoses Final diagnoses:  Subdural hematoma ([place])  Lung nodule  Fall, initial encounter  Unsteadiness    Rx / DC Orders ED Discharge Orders     None       Clinical Impression: 1. Subdural hematoma ([place])   2. Lung nodule   3. Fall, initial encounter   4. Unsteadiness     Disposition: Discharge  Condition: Good  I have discussed the results, Dx and Tx plan with the pt(& family if present). He/she/they expressed understanding and agree(s) with the plan. Discharge instructions discussed at great length. Strict return precautions discussed and pt &/or family have verbalized understanding of the instructions. No further questions at time of discharge.    Discharge Medication List as of 04/26/2022  9:24 PM      Follow Up: Dorothyann Peng, NP [place] [address] [phone]     Earnie Larsson, [place] [address] [place] [address] [phone]  Schedule an appointment as soon as possible for a visit in 1 week 1 week

## 2022-05-01 ENCOUNTER — Telehealth: Payer: Self-pay

## 2022-05-01 NOTE — Patient Outreach (Signed)
  Care Coordination TOC Note Transition Care Management Follow-up Telephone Call Date of discharge and from where: 04/26/22-[facility]  Dx" Subdural hematoma" Red on EMMI-ED Discharge Alert Reason: "Scheduled follow up appt? No" Red Alert Date:04/28/22 How have you been since you were released from the hospital? Call completed with spouse who reports patient currently eating breakfast at present. She shares that he is doing well-no new issues or concerns since returning home. He has been "taking it slow." They have neuro appt on tomorrow.  Any questions or concerns? No  Items Reviewed: Did the pt receive and understand the discharge instructions provided? Yes  Medications obtained and verified? Yes  Other? Yes  Any new allergies since your discharge? No  Dietary orders reviewed? Yes Do you have support at home? Yes   Home Care and Equipment/Supplies: Were home health services ordered? not applicable If so, what is the name of the agency? N/A  Has the agency set up a time to come to the patient's home? not applicable Were any new equipment or medical supplies ordered?  No What is the name of the medical supply agency? N/A Were you able to get the supplies/equipment? not applicable Do you have any questions related to the use of the equipment or supplies? No  Functional Questionnaire: (I = Independent and D = Dependent) ADLs: A  Bathing/Dressing- A  Meal Prep- A  Eating- I  Maintaining continence- I  Transferring/Ambulation- I  Managing Meds- A  Follow up appointments reviewed:  PCP Hospital f/u appt confirmed? No  Spouse states she will schedule an appt when they return from out of town trip-after holiday. [facility] f/u appt confirmed? Yes  Scheduled to see neurologist on 05/02/22. Are transportation arrangements needed? No  If their condition worsens, is the pt aware to call PCP or go to the Emergency Dept.? Yes Was the patient provided with contact information  for the PCP's office or ED? Yes Was to pt encouraged to call back with questions or concerns? Yes  SDOH assessments and interventions completed:   Yes SDOH Interventions Today    Flowsheet Row Most Recent Value  SDOH Interventions   Food Insecurity Interventions Intervention Not Indicated  Transportation Interventions Intervention Not Indicated       Care Coordination Interventions:  Education provided    Encounter Outcome:  Pt. Visit Completed    Enzo Montgomery, RN,BSN,CCM [facility] Telephonic Care Management Coordinator Direct Phone: [phone] Toll Free: [phone] Fax: [phone]

## 2022-05-02 DIAGNOSIS — S065XAA Traumatic subdural hemorrhage with loss of consciousness status unknown, initial encounter: Secondary | ICD-10-CM | POA: Diagnosis not present

## 2022-06-13 DIAGNOSIS — S065XAA Traumatic subdural hemorrhage with loss of consciousness status unknown, initial encounter: Secondary | ICD-10-CM | POA: Diagnosis not present

## 2022-06-13 DIAGNOSIS — Z6825 Body mass index (BMI) 25.0-25.9, adult: Secondary | ICD-10-CM | POA: Diagnosis not present

## 2022-06-14 ENCOUNTER — Other Ambulatory Visit: Payer: Self-pay | Admitting: Adult Health

## 2022-08-23 DIAGNOSIS — Z6826 Body mass index (BMI) 26.0-26.9, adult: Secondary | ICD-10-CM | POA: Diagnosis not present

## 2022-08-23 DIAGNOSIS — S065XAA Traumatic subdural hemorrhage with loss of consciousness status unknown, initial encounter: Secondary | ICD-10-CM | POA: Diagnosis not present

## 2022-11-05 ENCOUNTER — Other Ambulatory Visit: Payer: Self-pay | Admitting: Adult Health

## 2022-11-05 DIAGNOSIS — I10 Essential (primary) hypertension: Secondary | ICD-10-CM

## 2022-11-06 NOTE — Telephone Encounter (Signed)
Patient need to schedule an CPE for more refills. 

## 2022-11-08 ENCOUNTER — Telehealth: Payer: Self-pay | Admitting: Adult Health

## 2022-11-08 ENCOUNTER — Other Ambulatory Visit: Payer: Self-pay

## 2022-11-08 DIAGNOSIS — I10 Essential (primary) hypertension: Secondary | ICD-10-CM

## 2022-11-08 MED ORDER — ATORVASTATIN CALCIUM 20 MG PO TABS: 20.0000 mg | ORAL_TABLET | Freq: Every day | ORAL | 0 refills | Status: DC

## 2022-11-08 MED ORDER — LISINOPRIL-HYDROCHLOROTHIAZIDE 20-25 MG PO TABS: 1.0000 | ORAL_TABLET | Freq: Every day | ORAL | 0 refills | Status: DC

## 2022-11-08 MED ORDER — TAMSULOSIN HCL 0.4 MG PO CAPS: 0.4000 mg | ORAL_CAPSULE | Freq: Two times a day (BID) | ORAL | 0 refills | Status: DC

## 2022-11-08 NOTE — Telephone Encounter (Signed)
Ok to send in the atorvastatin not on pt med list I do see it in the past

## 2022-11-08 NOTE — Telephone Encounter (Signed)
Pt wife call and made him a cpe appt and stated he is out of his medication and want a refill sent in .

## 2022-11-08 NOTE — Telephone Encounter (Signed)
Pt need a refill on lisinopril-hydrochlorothiazide (ZESTORETIC) 20-25 MG tablet , tamsulosin (FLOMAX) 0.4 MG CAPS capsule , and atorvastatin pt does not know the mg. The cpe is 12-26-2022  [id number], [place] - [address] Phone: [phone]  Fax: [phone]

## 2022-11-08 NOTE — Telephone Encounter (Signed)
Rx refilled.

## 2022-11-22 DIAGNOSIS — S065XAA Traumatic subdural hemorrhage with loss of consciousness status unknown, initial encounter: Secondary | ICD-10-CM | POA: Diagnosis not present

## 2022-11-22 DIAGNOSIS — Z6827 Body mass index (BMI) 27.0-27.9, adult: Secondary | ICD-10-CM | POA: Diagnosis not present

## 2022-12-12 ENCOUNTER — Encounter (INDEPENDENT_AMBULATORY_CARE_PROVIDER_SITE_OTHER): Payer: Self-pay

## 2022-12-26 ENCOUNTER — Other Ambulatory Visit: Payer: Self-pay | Admitting: Adult Health

## 2022-12-26 ENCOUNTER — Ambulatory Visit (INDEPENDENT_AMBULATORY_CARE_PROVIDER_SITE_OTHER): Payer: Medicare HMO | Admitting: Adult Health

## 2022-12-26 ENCOUNTER — Encounter: Payer: Self-pay | Admitting: Adult Health

## 2022-12-26 VITALS — BP 130/70 | HR 73 | Temp 98.2°F | Ht 78.0 in | Wt 251.0 lb

## 2022-12-26 DIAGNOSIS — E785 Hyperlipidemia, unspecified: Secondary | ICD-10-CM | POA: Diagnosis not present

## 2022-12-26 DIAGNOSIS — C61 Malignant neoplasm of prostate: Secondary | ICD-10-CM | POA: Diagnosis not present

## 2022-12-26 DIAGNOSIS — Z0000 Encounter for general adult medical examination without abnormal findings: Secondary | ICD-10-CM

## 2022-12-26 DIAGNOSIS — Z7984 Long term (current) use of oral hypoglycemic drugs: Secondary | ICD-10-CM

## 2022-12-26 DIAGNOSIS — I10 Essential (primary) hypertension: Secondary | ICD-10-CM

## 2022-12-26 DIAGNOSIS — D696 Thrombocytopenia, unspecified: Secondary | ICD-10-CM

## 2022-12-26 DIAGNOSIS — R2681 Unsteadiness on feet: Secondary | ICD-10-CM

## 2022-12-26 DIAGNOSIS — E1169 Type 2 diabetes mellitus with other specified complication: Secondary | ICD-10-CM

## 2022-12-26 LAB — MICROALBUMIN / CREATININE URINE RATIO
Creatinine,U: 101.4 mg/dL
Microalb Creat Ratio: 1.2 mg/g (ref 0.0–30.0)
Microalb, Ur: 1.2 mg/dL (ref 0.0–1.9)

## 2022-12-26 LAB — CBC
HCT: 42.1 % (ref 39.0–52.0)
Hemoglobin: 14.2 g/dL (ref 13.0–17.0)
MCHC: 33.7 g/dL (ref 30.0–36.0)
MCV: 89.5 fl (ref 78.0–100.0)
Platelets: 117 10*3/uL — ABNORMAL LOW (ref 150.0–400.0)
RBC: 4.71 Mil/uL (ref 4.22–5.81)
RDW: 14 % (ref 11.5–15.5)
WBC: 4.2 10*3/uL (ref 4.0–10.5)

## 2022-12-26 LAB — COMPREHENSIVE METABOLIC PANEL
ALT: 13 U/L (ref 0–53)
AST: 14 U/L (ref 0–37)
Albumin: 3.8 g/dL (ref 3.5–5.2)
Alkaline Phosphatase: 51 U/L (ref 39–117)
BUN: 18 mg/dL (ref 6–23)
CO2: 28 mEq/L (ref 19–32)
Calcium: 8.9 mg/dL (ref 8.4–10.5)
Chloride: 105 mEq/L (ref 96–112)
Creatinine, Ser: 0.97 mg/dL (ref 0.40–1.50)
GFR: 74.21 mL/min (ref >60.00)
Glucose, Bld: 127 mg/dL — ABNORMAL HIGH (ref 70–99)
Potassium: 3.6 mEq/L (ref 3.5–5.1)
Sodium: 139 mEq/L (ref 135–145)
Total Bilirubin: 1 mg/dL (ref 0.2–1.2)
Total Protein: 6.1 g/dL (ref 6.0–8.3)

## 2022-12-26 LAB — LIPID PANEL
Cholesterol: 104 mg/dL (ref 0–200)
HDL: 36.5 mg/dL — ABNORMAL LOW (ref >39.00)
LDL Cholesterol: 60 mg/dL (ref 0–99)
NonHDL: 67.34
Total CHOL/HDL Ratio: 3
Triglycerides: 36 mg/dL (ref 0.0–149.0)
VLDL: 7.2 mg/dL (ref 0.0–40.0)

## 2022-12-26 LAB — PSA: PSA: 1.79 ng/mL (ref 0.10–4.00)

## 2022-12-26 LAB — TSH: TSH: 1.55 u[IU]/mL (ref 0.35–5.50)

## 2022-12-26 LAB — HEMOGLOBIN A1C: Hgb A1c MFr Bld: 6.5 % (ref 4.6–6.5)

## 2022-12-26 NOTE — Progress Notes (Signed)
Subjective:    Patient ID: Brandon [name], male    DOB: December 29, 1945, 77 y.o.   MRN: [id number]  HPI Patient presents for yearly preventative medicine examination. He is a pleasant 77 year old male who  has a past medical history of Allergy, Basal cell carcinoma, GERD (gastroesophageal reflux disease), History of kidney cancer, and Hypertension.  Essential hypertension-managed with lisinopril 5 hydrochlorothiazide 20-20 mg.  Denies episodes of lightheadedness, dizziness, headaches, blurred vision, or syncopal episodes BP Readings from Last 3 Encounters:  12/26/22 130/70  04/26/22 (!) 159/93  03/14/22 125/86   Hyperlipidemia-managed with Lipitor 20 mg daily.  He denies myalgia or fatigue Lab Results  Component Value Date   CHOL 97 10/20/2021   HDL 38.70 (L) 10/20/2021   LDLCALC 48 10/20/2021   TRIG 51.0 10/20/2021   CHOLHDL 2 10/20/2021   History of prostate cancer-Is no longer being seen by Urology due to slow growing nature of his prostate cancer.  Currently prescribed Proscar 5 mg and Flomax 0.4 mg.   DM Type 2 -currently diet controlled.  Lab Results  Component Value Date   HGBA1C 6.6 (H) 10/20/2021   Gait Instability - history of multiple subdural hematomas. He has fallen a few times. Using a cane and walker when ambulating. He feels as though his legs just get weak. He has been seen by PT in the past but did not find this very beneficial. He is interested in going back though     All immunizations and health maintenance protocols were reviewed with the patient and needed orders were placed.  Appropriate screening laboratory values were ordered for the patient including screening of hyperlipidemia, renal function and hepatic function. If indicated by BPH, a PSA was ordered.  Medication reconciliation,  past medical history, social history, problem list and allergies were reviewed in detail with the patient  Goals were established with regard to weight loss, exercise, and   diet in compliance with medications  Wt Readings from Last 3 Encounters:  12/26/22 251 lb (113.9 kg)  04/26/22 219 lb (99.3 kg)  03/14/22 229 lb (103.9 kg)     Review of Systems  Constitutional: Negative.   HENT: Negative.    Eyes: Negative.   Respiratory: Negative.    Cardiovascular: Negative.   Gastrointestinal: Negative.   Endocrine: Negative.   Genitourinary: Negative.   Musculoskeletal: Negative.   Skin: Negative.   Allergic/Immunologic: Negative.   Neurological:  Positive for weakness.  Hematological: Negative.   Psychiatric/Behavioral: Negative.    All other systems reviewed and are negative.  Past Medical History:  Diagnosis Date   Allergy    Basal cell carcinoma    GERD (gastroesophageal reflux disease)    History of kidney cancer    benign; told not cancerous   Hypertension     Social History   Socioeconomic History   Marital status: Married    Spouse name: Not on file   Number of children: Not on file   Years of education: Not on file   Highest education level: Bachelor's degree (e.g., BA, AB, BS)  Occupational History   Not on file  Tobacco Use   Smoking status: Former   Smokeless tobacco: Never  Vaping Use   Vaping status: Never Used  Substance and Sexual Activity   Alcohol use: Not Currently    Comment: "Beer every once in a while"   Drug use: No   Sexual activity: Not Currently  Other Topics Concern   Not on file  Social  History Narrative   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   Married    Three children (Two in [place] and [place])    Right handed   Social Determinants of Health   Financial Resource Strain: Low Risk  (11/23/2021)   Overall Financial Resource Strain (CARDIA)    Difficulty of Paying Living Expenses: Not hard at all  Food Insecurity: No Food Insecurity (05/01/2022)   Hunger Vital Sign    Worried About Running Out of Food in the Last Year: Never true    Ran Out of Food in the Last Year: Never true  Transportation Needs: No  Transportation Needs (05/01/2022)   PRAPARE - Administrator, Civil Service (Medical): No    Lack of Transportation (Non-Medical): No  Physical Activity: Insufficiently Active (11/23/2021)   Exercise Vital Sign    Days of Exercise per Week: 1 day    Minutes of Exercise per Session: 10 min  Stress: No Stress Concern Present (11/23/2021)   [employer] of Occupational Health - Occupational Stress Questionnaire    Feeling of Stress : Not at all  Social Connections: Unknown (03/21/2022)   Received from [facility]   Social Network    Social Network: Not on file  Intimate Partner Violence: Unknown (03/21/2022)   Received from [facility]   HITS    Physically Hurt: Not on file    Insult or Talk Down To: Not on file    Threaten Physical Harm: Not on file    Scream or Curse: Not on file    Past Surgical History:  Procedure Laterality Date   CATARACT EXTRACTION Bilateral    CRANIOTOMY Bilateral 12/25/2021   Procedure: CRANIOTOMY HEMATOMA EVACUATION SUBDURAL;  Surgeon: Julio Sicks, MD;  Location: [facility] OR;  Service: Neurosurgery;  Laterality: Bilateral;   HERNIA REPAIR     x2   KIDNEY SURGERY Right    Had right kidney removed.    TONSILLECTOMY     VASECTOMY      Family History  Problem Relation Age of Onset   Cancer Mother        Breast    Alzheimer's disease Mother    Cancer Father        Lung    Colon cancer Neg Hx     No Known Allergies  Current Outpatient Medications on File Prior to Visit  Medication Sig Dispense Refill   acetaminophen (TYLENOL) 500 MG tablet Take 1 tablet (500 mg total) by mouth 2 (two) times daily as needed for mild pain. 30 tablet 0   atorvastatin (LIPITOR) 20 MG tablet Take 1 tablet (20 mg total) by mouth daily. 90 tablet 0   B Complex Vitamins (VITAMIN B COMPLEX) TABS Take by mouth daily. 30 tablet 0   butalbital-acetaminophen-caffeine (FIORICET) 50-325-40 MG tablet Take 1 tablet by mouth every 6 (six)  hours as needed for headache. 10 tablet 0   cetirizine (ZYRTEC) 10 MG tablet Take 10 mg by mouth daily.     Cholecalciferol 25 MCG (1000 UT) tablet Take 1 tablet (1,000 Units total) by mouth daily. 30 tablet 0   diclofenac Sodium (VOLTAREN) 1 % GEL Apply 4 g topically 3 (three) times daily. 100 g 0   finasteride (PROSCAR) 5 MG tablet Take 1 tablet (5 mg total) by mouth daily. 90 tablet 0   latanoprost (XALATAN) 0.005 % ophthalmic solution Place 1 drop into both eyes at bedtime.     lisinopril-hydrochlorothiazide (ZESTORETIC) 20-25 MG tablet  Take 1 tablet by mouth daily. 90 tablet 0   tamsulosin (FLOMAX) 0.4 MG CAPS capsule Take 1 capsule (0.4 mg total) by mouth 2 (two) times daily. 180 capsule 0   traZODone (DESYREL) 100 MG tablet Take 1 tablet (100 mg total) by mouth at bedtime. 30 tablet 0   vitamin C (ASCORBIC ACID) 500 MG tablet Take 500 mg by mouth daily.     No current facility-administered medications on file prior to visit.    BP 130/70   Pulse 73   Temp 98.2 F (36.8 C) (Oral)   Ht 6\' 6"  (1.981 m)   Wt 251 lb (113.9 kg)   SpO2 97%   BMI 29.01 kg/m       Objective:   Physical Exam Vitals and nursing note reviewed.  Constitutional:      General: He is not in acute distress.    Appearance: Normal appearance. He is not ill-appearing.  HENT:     Head: Normocephalic and atraumatic.     Right Ear: Tympanic membrane, ear canal and external ear normal. There is no impacted cerumen.     Left Ear: Tympanic membrane, ear canal and external ear normal. There is no impacted cerumen.     Nose: Nose normal. No congestion or rhinorrhea.     Mouth/Throat:     Mouth: Mucous membranes are moist.     Pharynx: Oropharynx is clear.  Eyes:     Extraocular Movements: Extraocular movements intact.     Conjunctiva/sclera: Conjunctivae normal.     Pupils: Pupils are equal, round, and reactive to light.  Neck:     Vascular: No carotid bruit.  Cardiovascular:     Rate and Rhythm: Normal  rate and regular rhythm.     Pulses: Normal pulses.     Heart sounds: Murmur heard.     No friction rub. No gallop.  Pulmonary:     Effort: Pulmonary effort is normal.     Breath sounds: Normal breath sounds.  Abdominal:     General: Abdomen is flat. Bowel sounds are normal. There is no distension.     Palpations: Abdomen is soft. There is no mass.     Tenderness: There is no abdominal tenderness. There is no guarding or rebound.     Hernia: No hernia is present.  Musculoskeletal:        General: Normal range of motion.     Cervical back: Normal range of motion and neck supple.  Lymphadenopathy:     Cervical: No cervical adenopathy.  Skin:    General: Skin is warm and dry.     Capillary Refill: Capillary refill takes less than 2 seconds.  Neurological:     General: No focal deficit present.     Mental Status: He is alert and oriented to person, place, and time.     Gait: Gait abnormal (walking with cane).  Psychiatric:        Mood and Affect: Mood normal.        Behavior: Behavior normal.        Thought Content: Thought content normal.        Judgment: Judgment normal.        Assessment & Plan:   1. Routine general medical examination at a health care facility Today patient counseled on age appropriate routine health concerns for screening and prevention, each reviewed and up to date or declined. Immunizations reviewed and up to date or declined. Labs ordered and reviewed. Risk factors for depression reviewed  and negative. Hearing function and visual acuity are intact. ADLs screened and addressed as needed. Functional ability and level of safety reviewed and appropriate. Education, counseling and referrals performed based on assessed risks today. Patient provided with a copy of personalized plan for preventive services. - Follow up in one year or sooner if needed  2. Essential hypertension - well controlled. No change in medication  - Lipid panel; Future - TSH; Future -  CBC; Future - Comprehensive metabolic panel; Future  3. Hyperlipidemia, unspecified hyperlipidemia type - Consider change in statin  - Lipid panel; Future - TSH; Future - CBC; Future - Comprehensive metabolic panel; Future  4. Prostate cancer (HCC)  - PSA; Future  5. Type 2 diabetes mellitus with other specified complication, without long-term current use of insulin (HCC) - Consider adding metformin  - Follow up in 3 or 6 months depending on A1c  - Lipid panel; Future - TSH; Future - CBC; Future - Comprehensive metabolic panel; Future - PSA; Future - Hemoglobin A1c; Future - Microalbumin/Creatinine Ratio, Urine; Future  6. Gait instability - Continue to use cane and rolling walker.  - Ambulatory referral to Physical Therapy   Shirline Frees, NP

## 2022-12-26 NOTE — Patient Instructions (Signed)
It was great seeing you today   We will follow up with you regarding your lab work   Please let me know if you need anything   Please follow up for removal of skin neoplasm   I have referred you to physical therapy - they will call you to schedule your exam

## 2023-01-02 ENCOUNTER — Ambulatory Visit (INDEPENDENT_AMBULATORY_CARE_PROVIDER_SITE_OTHER): Payer: Medicare HMO | Admitting: Adult Health

## 2023-01-02 ENCOUNTER — Encounter: Payer: Self-pay | Admitting: Adult Health

## 2023-01-02 VITALS — BP 160/80 | HR 70 | Temp 97.8°F | Ht 78.0 in | Wt 247.0 lb

## 2023-01-02 DIAGNOSIS — D0462 Carcinoma in situ of skin of left upper limb, including shoulder: Secondary | ICD-10-CM

## 2023-01-02 DIAGNOSIS — D492 Neoplasm of unspecified behavior of bone, soft tissue, and skin: Secondary | ICD-10-CM

## 2023-01-02 DIAGNOSIS — L570 Actinic keratosis: Secondary | ICD-10-CM

## 2023-01-02 DIAGNOSIS — I10 Essential (primary) hypertension: Secondary | ICD-10-CM

## 2023-01-02 NOTE — Progress Notes (Addendum)
Subjective:    Patient ID: Brandon [name], male    DOB: 06/24/1945, 77 y.o.   MRN: [id number]  HPI  77 year old male who  has a past medical history of Allergy, Basal cell carcinoma, GERD (gastroesophageal reflux disease), History of kidney cancer, and Hypertension.  He presents to the office today for concern of a skin neoplasm on his left hand.  This neoplasm can become irritated when working with his hands.  He is unsure of how long it has been present for.  He also has some " spots on his right forearm that he is concerned about.   Review of Systems See HPI   Past Medical History:  Diagnosis Date   Allergy    Basal cell carcinoma    GERD (gastroesophageal reflux disease)    History of kidney cancer    benign; told not cancerous   Hypertension     Social History   Socioeconomic History   Marital status: Married    Spouse name: Not on file   Number of children: Not on file   Years of education: Not on file   Highest education level: Bachelor's degree (e.g., BA, AB, BS)  Occupational History   Not on file  Tobacco Use   Smoking status: Former   Smokeless tobacco: Never  Vaping Use   Vaping status: Never Used  Substance and Sexual Activity   Alcohol use: Not Currently    Comment: "Beer every once in a while"   Drug use: No   Sexual activity: Not Currently  Other Topics Concern   Not on file  Social History Narrative   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   Married    Three children ([place]    Right handed   Social Determinants of Health   Financial Resource Strain: Low Risk  (11/23/2021)   Overall Financial Resource Strain (CARDIA)    Difficulty of Paying Living Expenses: Not hard at all  Food Insecurity: No Food Insecurity (05/01/2022)   Hunger Vital Sign    Worried About Running Out of Food in the Last Year: Never true    Ran Out of Food in the Last Year: Never true  Transportation Needs: No Transportation Needs (05/01/2022)   PRAPARE -  Administrator, Civil Service (Medical): No    Lack of Transportation (Non-Medical): No  Physical Activity: Insufficiently Active (11/23/2021)   Exercise Vital Sign    Days of Exercise per Week: 1 day    Minutes of Exercise per Session: 10 min  Stress: No Stress Concern Present (11/23/2021)   [employer] of Occupational Health - Occupational Stress Questionnaire    Feeling of Stress : Not at all  Social Connections: Unknown (03/21/2022)   Received from [facility]   Social Network    Social Network: Not on file  Intimate Partner Violence: Unknown (03/21/2022)   Received from [facility], [facility]   HITS    Physically Hurt: Not on file    Insult or Talk Down To: Not on file    Threaten Physical Harm: Not on file    Scream or Curse: Not on file    Past Surgical History:  Procedure Laterality Date   CATARACT EXTRACTION Bilateral    CRANIOTOMY Bilateral 12/25/2021   Procedure: CRANIOTOMY HEMATOMA EVACUATION SUBDURAL;  Surgeon: Julio Sicks, MD;  Location: [facility] OR;  Service: Neurosurgery;  Laterality: Bilateral;   HERNIA REPAIR  x2   KIDNEY SURGERY Right    Had right kidney removed.    TONSILLECTOMY     VASECTOMY      Family History  Problem Relation Age of Onset   Cancer Mother        Breast    Alzheimer's disease Mother    Cancer Father        Lung    Colon cancer Neg Hx     No Known Allergies  Current Outpatient Medications on File Prior to Visit  Medication Sig Dispense Refill   acetaminophen (TYLENOL) 500 MG tablet Take 1 tablet (500 mg total) by mouth 2 (two) times daily as needed for mild pain. 30 tablet 0   atorvastatin (LIPITOR) 20 MG tablet Take 1 tablet (20 mg total) by mouth daily. 90 tablet 0   B Complex Vitamins (VITAMIN B COMPLEX) TABS Take by mouth daily. 30 tablet 0   butalbital-acetaminophen-caffeine (FIORICET) 50-325-40 MG tablet Take 1 tablet by mouth every 6 (six) hours as needed for headache. 10 tablet 0    cetirizine (ZYRTEC) 10 MG tablet Take 10 mg by mouth daily.     Cholecalciferol 25 MCG (1000 UT) tablet Take 1 tablet (1,000 Units total) by mouth daily. 30 tablet 0   diclofenac Sodium (VOLTAREN) 1 % GEL Apply 4 g topically 3 (three) times daily. 100 g 0   finasteride (PROSCAR) 5 MG tablet Take 1 tablet (5 mg total) by mouth daily. 90 tablet 0   latanoprost (XALATAN) 0.005 % ophthalmic solution Place 1 drop into both eyes at bedtime.     lisinopril-hydrochlorothiazide (ZESTORETIC) 20-25 MG tablet Take 1 tablet by mouth daily. 90 tablet 0   tamsulosin (FLOMAX) 0.4 MG CAPS capsule Take 1 capsule (0.4 mg total) by mouth 2 (two) times daily. 180 capsule 0   traZODone (DESYREL) 100 MG tablet Take 1 tablet (100 mg total) by mouth at bedtime. 30 tablet 0   vitamin C (ASCORBIC ACID) 500 MG tablet Take 500 mg by mouth daily.     No current facility-administered medications on file prior to visit.    BP (!) 160/80   Pulse 70   Temp 97.8 F (36.6 C) (Oral)   Ht 6\' 6"  (1.981 m)   Wt 247 lb (112 kg)   SpO2 96%   BMI 28.54 kg/m       Objective:   Physical Exam Vitals and nursing note reviewed.  Constitutional:      Appearance: Normal appearance.  Musculoskeletal:        General: Normal range of motion.  Skin:    General: Skin is warm and dry.     Comments: 6 mm x 3 mm Raised skin colored neoplasm  at base of left thumb.   He has four Actinic keratosis on his right forearm   Neurological:     General: No focal deficit present.     Mental Status: He is alert and oriented to person, place, and time.  Psychiatric:        Mood and Affect: Mood normal.        Behavior: Behavior normal.        Thought Content: Thought content normal.        Judgment: Judgment normal.       Assessment & Plan:  1. Skin neoplasm Procedure including risks/benefits explained to patient.  Questions were answered. After informed consent was obtained and a time out completed, site was cleansed with betadine and  then alcohol. 1% Lidocaine without  epinephrine was injected under lesion and then shave biopsy was performed. Area was cauterized to obtain hemostasis.  Pt tolerated procedure well.  Specimen sent for pathology review.  Pt instructed to keep the area dry for 24 hours and to contact us if he develops redness, drainage or swelling at the site.  Pt may use tylenol as needed for discomfort today.   - Dermatology pathology  2. Actinic keratosis - Verbal consent obtained and using cryotherapy 3 freeze thaw cycles were done on four different lesions  3. Essential hypertension - Elevated today but normally at baseline. He took his medication prior to arrival. Continue to monitor   Shirline Frees, NP

## 2023-01-10 ENCOUNTER — Other Ambulatory Visit: Payer: Self-pay

## 2023-01-10 ENCOUNTER — Encounter: Payer: Self-pay | Admitting: Rehabilitative and Restorative Service Providers"

## 2023-01-10 ENCOUNTER — Ambulatory Visit: Payer: Medicare HMO | Attending: Adult Health | Admitting: Rehabilitative and Restorative Service Providers"

## 2023-01-10 DIAGNOSIS — M6281 Muscle weakness (generalized): Secondary | ICD-10-CM | POA: Diagnosis not present

## 2023-01-10 DIAGNOSIS — R2681 Unsteadiness on feet: Secondary | ICD-10-CM | POA: Diagnosis not present

## 2023-01-10 DIAGNOSIS — R2689 Other abnormalities of gait and mobility: Secondary | ICD-10-CM | POA: Insufficient documentation

## 2023-01-10 NOTE — Therapy (Signed)
OUTPATIENT PHYSICAL THERAPY LOWER EXTREMITY EVALUATION   Patient Name: Brandon [name] MRN: [id number] DOB:04-Nov-1945, 77 y.o., male Today's Date: 01/10/2023  END OF SESSION:  PT End of Session - 01/10/23 0906     Visit Number 1    Date for PT Re-Evaluation 03/08/23    Authorization Type [employer]    Progress Note Due on Visit 10    PT Start Time 0859    PT Stop Time 0940    PT Time Calculation (min) 41 min    Activity Tolerance Patient tolerated treatment well    Behavior During Therapy WFL for tasks assessed/performed             Past Medical History:  Diagnosis Date   Allergy    Basal cell carcinoma    GERD (gastroesophageal reflux disease)    History of kidney cancer    benign; told not cancerous   Hypertension    Past Surgical History:  Procedure Laterality Date   CATARACT EXTRACTION Bilateral    CRANIOTOMY Bilateral 12/25/2021   Procedure: CRANIOTOMY HEMATOMA EVACUATION SUBDURAL;  Surgeon: Julio Sicks, MD;  Location: [facility] OR;  Service: Neurosurgery;  Laterality: Bilateral;   HERNIA REPAIR     x2   KIDNEY SURGERY Right    Had right kidney removed.    TONSILLECTOMY     VASECTOMY     Patient Active Problem List   Diagnosis Date Noted   Cognitive and neurobehavioral dysfunction following brain injury (HCC) 02/07/2022   Gait difficulty 02/07/2022   Traumatic brain injury with loss of consciousness (HCC) 12/30/2021   SDH (subdural hematoma) (HCC) 12/24/2021   Hyperlipidemia 02/13/2018   BPH (benign prostatic hyperplasia) 01/31/2017   Heart murmur 01/20/2016   HTN (hypertension) 03/09/2012    PCP: Shirline Frees, NP  REFERRING PROVIDER: Shirline Frees, NP  REFERRING DIAG: R26.81 (ICD-10-CM) - Gait instability  THERAPY DIAG:  Balance problem  Muscle weakness (generalized)  Unsteadiness on feet  Rationale for Evaluation and Treatment: Rehabilitation  ONSET DATE: Pt and wife report that frequent falls have been occurring for the past  year  SUBJECTIVE:   SUBJECTIVE STATEMENT: Pt presents to PT Evaluation with his wife.  Patient and wife report that he had a bad fall where he hit his head a year ago and sustained a subdural hematoma.  Pt has had multiple falls since that time and was referred to PT again to assist with improved balance and decreased risk of falling.  PERTINENT HISTORY: Subdural Hematoma, HTN, GERD, Basal Cell Carcinoma, Hx of benign kidney mass, OA  PAIN:  Are you having pain? Yes: NPRS scale: 4/10 Pain location: bilateral knees (right greater than left) Pain description: sore Aggravating factors: unknown Relieving factors: Voltaren gel  PRECAUTIONS: Fall  RED FLAGS: None   WEIGHT BEARING RESTRICTIONS: No  FALLS:  Has patient fallen in last 6 months? Yes. Number of falls 3  LIVING ENVIRONMENT: Lives with: lives with their spouse Lives in: House/apartment Stairs:  one level home, one step to enter porch Has following equipment at home: Single point cane, Environmental consultant - 2 wheeled, Environmental consultant - 4 wheeled, shower chair, and Grab bars  OCCUPATION: retired  PLOF: Requires assistive device for independence and Leisure: walking with wife, going out to eat, movies  PATIENT GOALS: To be able to walk into wedding venue for granddaughter's wedding on September 21st, 2024.  NEXT MD VISIT: Shirline Frees on 07/01/2022  OBJECTIVE:   DIAGNOSTIC FINDINGS: Head CT on 04/27/2023: IMPRESSION: 1. Stable 6 mm acute  on chronic left subdural hematoma. No associated midline shift. 2. Stable 6 mm chronic right subdural hematoma. 3. No new intracranial abnormality. 4. No acute displaced fracture or traumatic listhesis of the cervical spine. 5. Persistent right apical subpleural 1.5 cm ground-glass nodular-like airspace opacity (increased in size from 2018). Adenocarcinoma is not excluded. Additional imaging evaluation or consultation with Pulmonology or Thoracic Surgery recommended.  PATIENT SURVEYS:  Eval:   LEFS 20 / 80 = 25.0 %  COGNITION: Overall cognitive status:  wife reports occasionally pt has word finding problems and some memory impairments      SENSATION: Wife reports that sometimes left hand has "spasms"  MUSCLE LENGTH: Hamstrings: Tightness bilaterally  POSTURE: rounded shoulders, forward head, and flexed trunk    LOWER EXTREMITY ROM:  WFL  LOWER EXTREMITY MMT:  Eval: Right LE strength grossly 4-/5 Left LE strength grossly 4 to 4+/5 throughout  FUNCTIONAL TESTS:  Eval:   5 times sit to stand: 14.96 sec with UE use required Timed up and go (TUG): 13.71 sec without UE device with unsteadiness noted especially with 180 degree turn BERG:  27/56  GAIT: Distance walked: >200 ft Assistive device utilized: Single point cane Level of assistance: SBA Comments: Pt with unsteady gait noted and requires SBA   TODAY'S TREATMENT:                                                                                                                              DATE: 01/10/2023  Reviewed HEP (see below)   PATIENT EDUCATION:  Education details: Issued HEP Person educated: Patient and Spouse Education method: Explanation, Facilities manager, and Handouts Education comprehension: verbalized understanding  HOME EXERCISE PROGRAM: Access Code: [email or web address] URL: [email or web address] Date: 01/10/2023 Prepared by: Clydie Braun Elanie Hammitt  Exercises - Sit to Stand  - 1 x daily - 7 x weekly - 2 sets - 10 reps - Standing Hip Abduction with Counter Support  - 1 x daily - 7 x weekly - 2 sets - 10 reps - Standing Hip Extension with Counter Support  - 1 x daily - 7 x weekly - 2 sets - 10 reps - Standing March with Counter Support  - 1 x daily - 7 x weekly - 2 sets - 10 reps - Heel Raises with Counter Support  - 1 x daily - 7 x weekly - 2 sets - 10 reps  ASSESSMENT:  CLINICAL IMPRESSION: Patient is a 77 y.o. male who was seen today for physical therapy evaluation and treatment for gait  instability.  Patient presents with his wife and they report that pt has had multiple recent falls and wife attributes to LE weakness.  Patient with history of fall with subdural hematoma a year ago and she reports that his balance has been impaired since that time.  Patient presents with difficulty with walking with cane initially set too low for his height.  After PT adjusted, he was able to ambulate with improved stability with him  reporting that it felt better.  Pt with LE weakness, decreased balance, difficulty walking, and frequent falls.  Patient is hoping to be able to walk around his granddaughter's wedding venue in a month without fear of falling.  Patient would benefit from skilled PT to address his impairments and decrease his overall risk of falling.   OBJECTIVE IMPAIRMENTS: decreased balance, difficulty walking, decreased strength, impaired flexibility, postural dysfunction, and pain.   ACTIVITY LIMITATIONS: carrying, lifting, bending, standing, squatting, and stairs  PARTICIPATION LIMITATIONS: cleaning and community activity  PERSONAL FACTORS: Past/current experiences, Time since onset of injury/illness/exacerbation, and 3+ comorbidities: subdural hematoma, HTN, OA  are also affecting patient's functional outcome.   REHAB POTENTIAL: Good  CLINICAL DECISION MAKING: Evolving/moderate complexity  EVALUATION COMPLEXITY: Moderate   GOALS: Goals reviewed with patient? Yes  SHORT TERM GOALS: Target date: 02/01/2023 Pt will be independent with initial HEP. Baseline: Goal status: INITIAL  2.  Pt will improve ambulation safety with least restrictive assistive device to allow him to safely ambulate distances required for granddaughter's wedding on 02/02/2023. Baseline:  Goal status: INITIAL   LONG TERM GOALS: Target date: 03/08/2023  Pt will be independent with advanced HEP to allow for self progression post discharge. Baseline:  Goal status: INITIAL  2.  Patient will increase  Lower Extremity Functional Scale to at least 40% to demonstrate improvement in functional tasks. Baseline: 25% Goal status: INITIAL  3.  Patient will increase BERG to at least 40/56 to demonstrate decreased risk of falling. Baseline: 27/56 Goal status: INITIAL  4.  Pt will report being able to ambulate at least 20 minutes with LRAD without increased pain and no loss of balance to allow for community ambulation. Baseline:  Goal status: INITIAL  5.  Pt will increase LE strength to at least 4+/5 to allow him to perform sit to stand without UE use and navigate a curb with LRAD safely. Baseline:  Goal status: INITIAL   PLAN:  PT FREQUENCY: 2x/week  PT DURATION: 8 weeks  PLANNED INTERVENTIONS: Therapeutic exercises, Therapeutic activity, Neuromuscular re-education, Balance training, Gait training, Patient/Family education, Self Care, Joint mobilization, Joint manipulation, Stair training, Vestibular training, Canalith repositioning, Aquatic Therapy, Dry Needling, Electrical stimulation, Spinal manipulation, Spinal mobilization, Cryotherapy, Moist heat, Taping, Traction, Ultrasound, Ionotophoresis 4mg /ml Dexamethasone, Manual therapy, and Re-evaluation  PLAN FOR NEXT SESSION: Assess and progress HEP as indicated, strengthening, balance, flexibility   Reather Laurence, PT 01/10/2023, 9:08 AM   [facility] [address] [place], [place] [address] Phone # [phone] Fax [phone]

## 2023-01-11 ENCOUNTER — Ambulatory Visit: Payer: Medicare HMO | Admitting: Adult Health

## 2023-01-15 ENCOUNTER — Ambulatory Visit: Payer: Medicare HMO | Attending: Adult Health

## 2023-01-15 DIAGNOSIS — M6281 Muscle weakness (generalized): Secondary | ICD-10-CM | POA: Insufficient documentation

## 2023-01-15 DIAGNOSIS — R293 Abnormal posture: Secondary | ICD-10-CM | POA: Insufficient documentation

## 2023-01-15 DIAGNOSIS — R2681 Unsteadiness on feet: Secondary | ICD-10-CM | POA: Insufficient documentation

## 2023-01-17 ENCOUNTER — Ambulatory Visit: Payer: Medicare HMO

## 2023-01-17 DIAGNOSIS — R2689 Other abnormalities of gait and mobility: Secondary | ICD-10-CM | POA: Diagnosis present

## 2023-01-17 DIAGNOSIS — M6281 Muscle weakness (generalized): Secondary | ICD-10-CM | POA: Diagnosis not present

## 2023-01-17 DIAGNOSIS — R2681 Unsteadiness on feet: Secondary | ICD-10-CM | POA: Diagnosis not present

## 2023-01-17 DIAGNOSIS — R262 Difficulty in walking, not elsewhere classified: Secondary | ICD-10-CM

## 2023-01-17 DIAGNOSIS — R293 Abnormal posture: Secondary | ICD-10-CM | POA: Diagnosis not present

## 2023-01-17 NOTE — Therapy (Signed)
OUTPATIENT PHYSICAL THERAPY LOWER EXTREMITY TREATMENT   Patient Name: Brandon [name] MRN: [id number] DOB:1945-06-11, 77 y.o., male Today's Date: 01/17/2023  END OF SESSION:  PT End of Session - 01/17/23 1400     Visit Number 2    Date for PT Re-Evaluation 03/08/23    Authorization Type [employer]    Progress Note Due on Visit 10    PT Start Time 1400    PT Stop Time 1445    PT Time Calculation (min) 45 min    Activity Tolerance Patient tolerated treatment well    Behavior During Therapy WFL for tasks assessed/performed             Past Medical History:  Diagnosis Date   Allergy    Basal cell carcinoma    GERD (gastroesophageal reflux disease)    History of kidney cancer    benign; told not cancerous   Hypertension    Past Surgical History:  Procedure Laterality Date   CATARACT EXTRACTION Bilateral    CRANIOTOMY Bilateral 12/25/2021   Procedure: CRANIOTOMY HEMATOMA EVACUATION SUBDURAL;  Surgeon: Julio Sicks, MD;  Location: [facility] OR;  Service: Neurosurgery;  Laterality: Bilateral;   HERNIA REPAIR     x2   KIDNEY SURGERY Right    Had right kidney removed.    TONSILLECTOMY     VASECTOMY     Patient Active Problem List   Diagnosis Date Noted   Cognitive and neurobehavioral dysfunction following brain injury (HCC) 02/07/2022   Gait difficulty 02/07/2022   Traumatic brain injury with loss of consciousness (HCC) 12/30/2021   SDH (subdural hematoma) (HCC) 12/24/2021   Hyperlipidemia 02/13/2018   BPH (benign prostatic hyperplasia) 01/31/2017   Heart murmur 01/20/2016   HTN (hypertension) 03/09/2012    PCP: Shirline Frees, NP  REFERRING PROVIDER: Shirline Frees, NP  REFERRING DIAG: R26.81 (ICD-10-CM) - Gait instability  THERAPY DIAG:  Balance problem  Muscle weakness (generalized)  Unsteadiness on feet  Difficulty in walking, not elsewhere classified  Abnormal posture  Rationale for Evaluation and Treatment: Rehabilitation  ONSET DATE: Pt and wife  report that frequent falls have been occurring for the past year  SUBJECTIVE:   SUBJECTIVE STATEMENT: Patient arrives with [facility].  He ambulates with slightly unsteady gait.  He is very HOH despite hearing aids.  He reports no problems or issues since last visit.    PERTINENT HISTORY: Subdural Hematoma, HTN, GERD, Basal Cell Carcinoma, Hx of benign kidney mass, OA  PAIN:  Are you having pain? Yes: NPRS scale: 4/10 Pain location: bilateral knees (right greater than left) Pain description: sore Aggravating factors: unknown Relieving factors: Voltaren gel  PRECAUTIONS: Fall  RED FLAGS: None   WEIGHT BEARING RESTRICTIONS: No  FALLS:  Has patient fallen in last 6 months? Yes. Number of falls 3  LIVING ENVIRONMENT: Lives with: lives with their spouse Lives in: House/apartment Stairs:  one level home, one step to enter porch Has following equipment at home: Single point cane, Environmental consultant - 2 wheeled, Environmental consultant - 4 wheeled, shower chair, and Grab bars  OCCUPATION: retired  PLOF: Requires assistive device for independence and Leisure: walking with wife, going out to eat, movies  PATIENT GOALS: To be able to walk into wedding venue for granddaughter's wedding on September 21st, 2024.  NEXT MD VISIT: Shirline Frees on 07/01/2022  OBJECTIVE:   DIAGNOSTIC FINDINGS: Head CT on 04/27/2023: IMPRESSION: 1. Stable 6 mm acute on chronic left subdural hematoma. No associated midline shift. 2. Stable 6 mm chronic right subdural  hematoma. 3. No new intracranial abnormality. 4. No acute displaced fracture or traumatic listhesis of the cervical spine. 5. Persistent right apical subpleural 1.5 cm ground-glass nodular-like airspace opacity (increased in size from 2018). Adenocarcinoma is not excluded. Additional imaging evaluation or consultation with Pulmonology or Thoracic Surgery recommended.  PATIENT SURVEYS:  Eval:  LEFS 20 / 80 = 25.0 %  COGNITION: Overall cognitive status:  wife reports  occasionally pt has word finding problems and some memory impairments      SENSATION: Wife reports that sometimes left hand has "spasms"  MUSCLE LENGTH: Hamstrings: Tightness bilaterally  POSTURE: rounded shoulders, forward head, and flexed trunk    LOWER EXTREMITY ROM:  WFL  LOWER EXTREMITY MMT:  Eval: Right LE strength grossly 4-/5 Left LE strength grossly 4 to 4+/5 throughout  FUNCTIONAL TESTS:  Eval:   5 times sit to stand: 14.96 sec with UE use required Timed up and go (TUG): 13.71 sec without UE device with unsteadiness noted especially with 180 degree turn BERG:  27/56  GAIT: Distance walked: >200 ft Assistive device utilized: Single point cane Level of assistance: SBA Comments: Pt with unsteady gait noted and requires SBA   TODAY'S TREATMENT:                                                                                                                              DATE: 01/17/2023  Nustep x 5 min level 4 Patient had to take bathroom break after Nustep Standing hamstring stretch at steps 2 x 30 sec each LE Standing quad/hip flexor stretch 2 x 30 sec each LE Seated LAQ 2 x 10 with 5lb ankle weights Seated clam x 20 with yellow loop Sit to stand with 2 balance pads in chair x 10 At barre: standing hip abduction and extension 2 x 10 Lateral band walks with green loop x 4 laps at barre Leg Press 100 lbs seat 9 (will need help getting legs up) x 20  DATE: 01/10/2023  Reviewed HEP (see below)   PATIENT EDUCATION:  Education details: Issued HEP Person educated: Patient and Spouse Education method: Explanation, Facilities manager, and Handouts Education comprehension: verbalized understanding  HOME EXERCISE PROGRAM: Access Code: [email or web address] URL: [email or web address] Date: 01/10/2023 Prepared by: Clydie Braun Menke  Exercises - Sit to Stand  - 1 x daily - 7 x weekly - 2 sets - 10 reps - Standing Hip Abduction with Counter Support  - 1 x daily - 7 x  weekly - 2 sets - 10 reps - Standing Hip Extension with Counter Support  - 1 x daily - 7 x weekly - 2 sets - 10 reps - Standing March with Counter Support  - 1 x daily - 7 x weekly - 2 sets - 10 reps - Heel Raises with Counter Support  - 1 x daily - 7 x weekly - 2 sets - 10 reps  ASSESSMENT:  CLINICAL IMPRESSION: Patient was able to complete all tasks today with mod  fatigue.  He was able to do sit to stand from chair with 2 balance pads without use of hands but due to his height, he must use hands if trying to do this without the balance pads.  He demonstrates proximal hip weakness and lack of toe off during various activities.  He is well motivated and should continue to do well.   Patient is hoping to be able to walk around his granddaughter's wedding venue in a month without fear of falling.  Patient would benefit from skilled PT to address his impairments and decrease his overall risk of falling.   OBJECTIVE IMPAIRMENTS: decreased balance, difficulty walking, decreased strength, impaired flexibility, postural dysfunction, and pain.   ACTIVITY LIMITATIONS: carrying, lifting, bending, standing, squatting, and stairs  PARTICIPATION LIMITATIONS: cleaning and community activity  PERSONAL FACTORS: Past/current experiences, Time since onset of injury/illness/exacerbation, and 3+ comorbidities: subdural hematoma, HTN, OA  are also affecting patient's functional outcome.   REHAB POTENTIAL: Good  CLINICAL DECISION MAKING: Evolving/moderate complexity  EVALUATION COMPLEXITY: Moderate   GOALS: Goals reviewed with patient? Yes  SHORT TERM GOALS: Target date: 02/01/2023 Pt will be independent with initial HEP. Baseline: Goal status: INITIAL  2.  Pt will improve ambulation safety with least restrictive assistive device to allow him to safely ambulate distances required for granddaughter's wedding on 02/02/2023. Baseline:  Goal status: INITIAL   LONG TERM GOALS: Target date: 03/08/2023  Pt  will be independent with advanced HEP to allow for self progression post discharge. Baseline:  Goal status: INITIAL  2.  Patient will increase Lower Extremity Functional Scale to at least 40% to demonstrate improvement in functional tasks. Baseline: 25% Goal status: INITIAL  3.  Patient will increase BERG to at least 40/56 to demonstrate decreased risk of falling. Baseline: 27/56 Goal status: INITIAL  4.  Pt will report being able to ambulate at least 20 minutes with LRAD without increased pain and no loss of balance to allow for community ambulation. Baseline:  Goal status: INITIAL  5.  Pt will increase LE strength to at least 4+/5 to allow him to perform sit to stand without UE use and navigate a curb with LRAD safely. Baseline:  Goal status: INITIAL   PLAN:  PT FREQUENCY: 2x/week  PT DURATION: 8 weeks  PLANNED INTERVENTIONS: Therapeutic exercises, Therapeutic activity, Neuromuscular re-education, Balance training, Gait training, Patient/Family education, Self Care, Joint mobilization, Joint manipulation, Stair training, Vestibular training, Canalith repositioning, Aquatic Therapy, Dry Needling, Electrical stimulation, Spinal manipulation, Spinal mobilization, Cryotherapy, Moist heat, Taping, Traction, Ultrasound, Ionotophoresis 4mg /ml Dexamethasone, Manual therapy, and Re-evaluation  PLAN FOR NEXT SESSION: Strengthening, balance, flexibility, focus on proximal hip and core strength.     Victorino Dike B. Mykeisha Dysert, PT 01/17/23 2:48 PM [facility] [address], [address] [place], [place] [address] Phone # [phone] Fax [phone]

## 2023-01-24 ENCOUNTER — Ambulatory Visit: Payer: Medicare HMO | Admitting: Rehabilitative and Restorative Service Providers"

## 2023-01-24 ENCOUNTER — Encounter: Payer: Self-pay | Admitting: Rehabilitative and Restorative Service Providers"

## 2023-01-24 DIAGNOSIS — M6281 Muscle weakness (generalized): Secondary | ICD-10-CM

## 2023-01-24 DIAGNOSIS — R2681 Unsteadiness on feet: Secondary | ICD-10-CM | POA: Diagnosis not present

## 2023-01-24 DIAGNOSIS — R293 Abnormal posture: Secondary | ICD-10-CM | POA: Diagnosis not present

## 2023-01-24 DIAGNOSIS — R2689 Other abnormalities of gait and mobility: Secondary | ICD-10-CM

## 2023-01-24 NOTE — Therapy (Signed)
OUTPATIENT PHYSICAL THERAPY TREATMENT NOTE   Patient Name: Brandon [name] MRN: [id number] DOB:Mar 30, 1946, 77 y.o., male Today's Date: 01/24/2023  END OF SESSION:  PT End of Session - 01/24/23 0750     Visit Number 3    Date for PT Re-Evaluation 03/08/23    Authorization Type [employer]    Authorization Time Period 01/10/2023 - 03/08/2023    Authorization - Visit Number 3    Authorization - Number of Visits 16    Progress Note Due on Visit 10    PT Start Time 0745    PT Stop Time 0825    PT Time Calculation (min) 40 min    Activity Tolerance Patient tolerated treatment well    Behavior During Therapy WFL for tasks assessed/performed             Past Medical History:  Diagnosis Date   Allergy    Basal cell carcinoma    GERD (gastroesophageal reflux disease)    History of kidney cancer    benign; told not cancerous   Hypertension    Past Surgical History:  Procedure Laterality Date   CATARACT EXTRACTION Bilateral    CRANIOTOMY Bilateral 12/25/2021   Procedure: CRANIOTOMY HEMATOMA EVACUATION SUBDURAL;  Surgeon: Julio Sicks, MD;  Location: [facility] OR;  Service: Neurosurgery;  Laterality: Bilateral;   HERNIA REPAIR     x2   KIDNEY SURGERY Right    Had right kidney removed.    TONSILLECTOMY     VASECTOMY     Patient Active Problem List   Diagnosis Date Noted   Cognitive and neurobehavioral dysfunction following brain injury (HCC) 02/07/2022   Gait difficulty 02/07/2022   Traumatic brain injury with loss of consciousness (HCC) 12/30/2021   SDH (subdural hematoma) (HCC) 12/24/2021   Hyperlipidemia 02/13/2018   BPH (benign prostatic hyperplasia) 01/31/2017   Heart murmur 01/20/2016   HTN (hypertension) 03/09/2012    PCP: Shirline Frees, NP  REFERRING PROVIDER: Shirline Frees, NP  REFERRING DIAG: R26.81 (ICD-10-CM) - Gait instability  THERAPY DIAG:  Balance problem  Muscle weakness (generalized)  Unsteadiness on feet  Rationale for Evaluation and  Treatment: Rehabilitation  ONSET DATE: Pt and wife report that frequent falls have been occurring for the past year  SUBJECTIVE:   SUBJECTIVE STATEMENT: Patient presents with SPC.  Wife present during visit and states that she has noticed pt getting stronger.  Pt reports some knee pain of 4/10.  Denies any new falls.   PERTINENT HISTORY: Subdural Hematoma, HTN, GERD, Basal Cell Carcinoma, Hx of benign kidney mass, OA  PAIN:  Are you having pain? Yes: NPRS scale: currently 4/10 Pain location: bilateral knees (right greater than left) Pain description: sore Aggravating factors: unknown Relieving factors: Voltaren gel  PRECAUTIONS: Fall  RED FLAGS: None   WEIGHT BEARING RESTRICTIONS: No  FALLS:  Has patient fallen in last 6 months? Yes. Number of falls 3  LIVING ENVIRONMENT: Lives with: lives with their spouse Lives in: House/apartment Stairs:  one level home, one step to enter porch Has following equipment at home: Single point cane, Environmental consultant - 2 wheeled, Environmental consultant - 4 wheeled, shower chair, and Grab bars  OCCUPATION: retired  PLOF: Requires assistive device for independence and Leisure: walking with wife, going out to eat, movies  PATIENT GOALS: To be able to walk into wedding venue for granddaughter's wedding on September 21st, 2024.  NEXT MD VISIT: Shirline Frees on 07/01/2022  OBJECTIVE:   DIAGNOSTIC FINDINGS: Head CT on 04/27/2023: IMPRESSION: 1. Stable 6 mm  acute on chronic left subdural hematoma. No associated midline shift. 2. Stable 6 mm chronic right subdural hematoma. 3. No new intracranial abnormality. 4. No acute displaced fracture or traumatic listhesis of the cervical spine. 5. Persistent right apical subpleural 1.5 cm ground-glass nodular-like airspace opacity (increased in size from 2018). Adenocarcinoma is not excluded. Additional imaging evaluation or consultation with Pulmonology or Thoracic Surgery recommended.  PATIENT SURVEYS:  Eval:  LEFS 20  / 80 = 25.0 %  COGNITION: Overall cognitive status:  wife reports occasionally pt has word finding problems and some memory impairments      SENSATION: Wife reports that sometimes left hand has "spasms"  MUSCLE LENGTH: Hamstrings: Tightness bilaterally  POSTURE: rounded shoulders, forward head, and flexed trunk    LOWER EXTREMITY ROM:  WFL  LOWER EXTREMITY MMT:  Eval: Right LE strength grossly 4-/5 Left LE strength grossly 4 to 4+/5 throughout  FUNCTIONAL TESTS:  Eval:   5 times sit to stand: 14.96 sec with UE use required Timed up and go (TUG): 13.71 sec without UE device with unsteadiness noted especially with 180 degree turn BERG:  27/56  01/24/2023: 3 minute walk:  378 ft with SPC (pt with shuffling gait pattern)  GAIT: Distance walked: >200 ft Assistive device utilized: Single point cane Level of assistance: SBA Comments: Pt with unsteady gait noted and requires SBA   TODAY'S TREATMENT:                                                                                                                               DATE: 01/24/2023 Nustep level 5 x6 min with PT present to discuss status Seated with 5#:  LAQ and marching.  BLE 2x10 each Seated hip abduction clamshell with yellow loop 2x10 Seated hamstring curl with green tband 2x10 bilat Sit to/from stand 2x10 (with heavy reliance of UE) 3 min amb with SPC x378 ft with cuing for longer stride length secondary to short/shuffling steps Standing marching at barre 2x10 bilat Standing hip abduction at barre 2x10 bilat Standing hip extension at barre 2x10 bilat Seated hamstring stretch x20 sec bilat   DATE: 01/17/2023  Nustep x 5 min level 4 Patient had to take bathroom break after Nustep Standing hamstring stretch at steps 2 x 30 sec each LE Standing quad/hip flexor stretch 2 x 30 sec each LE Seated LAQ 2 x 10 with 5lb ankle weights Seated clam x 20 with yellow loop Sit to stand with 2 balance pads in chair x  10 At barre: standing hip abduction and extension 2 x 10 Lateral band walks with green loop x 4 laps at barre Leg Press 100 lbs seat 9 (will need help getting legs up) x 20  DATE: 01/10/2023  Reviewed HEP (see below)   PATIENT EDUCATION:  Education details: Issued HEP Person educated: Patient and Spouse Education method: Explanation, Facilities manager, and Handouts Education comprehension: verbalized understanding  HOME EXERCISE PROGRAM: Access Code: [email or web address] URL: [email or web address] Date:  01/10/2023 Prepared by: Reather Laurence  Exercises - Sit to Stand  - 1 x daily - 7 x weekly - 2 sets - 10 reps - Standing Hip Abduction with Counter Support  - 1 x daily - 7 x weekly - 2 sets - 10 reps - Standing Hip Extension with Counter Support  - 1 x daily - 7 x weekly - 2 sets - 10 reps - Standing March with Counter Support  - 1 x daily - 7 x weekly - 2 sets - 10 reps - Heel Raises with Counter Support  - 1 x daily - 7 x weekly - 2 sets - 10 reps  ASSESSMENT:  CLINICAL IMPRESSION: Mr Schnitzer presents to skilled PT with [facility] with wife reporting compliance with HEP.  Patient denies any falls.  Patient not scheduled for another visit until after the wedding, but wife placed patient on the waiting list to attempt to have one additional PT visit prior to the wedding.  Patient with shuffling gait secondary to decreased hip flexion and foot clearance.  Pt requires cuing for longer stride length and bigger steps.  Patient requires brief seated recovery periods throughout secondary to fatigue.  Patient requires UE support with sit to stand secondary to weakness and decreased muscle power production.  Patient continues to require skilled PT to progress towards goal related activities and decreased risk of falling.  OBJECTIVE IMPAIRMENTS: decreased balance, difficulty walking, decreased strength, impaired flexibility, postural dysfunction, and pain.   ACTIVITY LIMITATIONS: carrying, lifting,  bending, standing, squatting, and stairs  PARTICIPATION LIMITATIONS: cleaning and community activity  PERSONAL FACTORS: Past/current experiences, Time since onset of injury/illness/exacerbation, and 3+ comorbidities: subdural hematoma, HTN, OA  are also affecting patient's functional outcome.   REHAB POTENTIAL: Good  CLINICAL DECISION MAKING: Evolving/moderate complexity  EVALUATION COMPLEXITY: Moderate   GOALS: Goals reviewed with patient? Yes  SHORT TERM GOALS: Target date: 02/01/2023 Pt will be independent with initial HEP. Baseline: Goal status: MET  2.  Pt will improve ambulation safety with least restrictive assistive device to allow him to safely ambulate distances required for granddaughter's wedding on 02/02/2023. Baseline:  Goal status: Ongoing   LONG TERM GOALS: Target date: 03/08/2023  Pt will be independent with advanced HEP to allow for self progression post discharge. Baseline:  Goal status: INITIAL  2.  Patient will increase Lower Extremity Functional Scale to at least 40% to demonstrate improvement in functional tasks. Baseline: 25% Goal status: INITIAL  3.  Patient will increase BERG to at least 40/56 to demonstrate decreased risk of falling. Baseline: 27/56 Goal status: INITIAL  4.  Pt will report being able to ambulate at least 20 minutes with LRAD without increased pain and no loss of balance to allow for community ambulation. Baseline:  Goal status: INITIAL  5.  Pt will increase LE strength to at least 4+/5 to allow him to perform sit to stand without UE use and navigate a curb with LRAD safely. Baseline:  Goal status: INITIAL   PLAN:  PT FREQUENCY: 2x/week  PT DURATION: 8 weeks  PLANNED INTERVENTIONS: Therapeutic exercises, Therapeutic activity, Neuromuscular re-education, Balance training, Gait training, Patient/Family education, Self Care, Joint mobilization, Joint manipulation, Stair training, Vestibular training, Canalith repositioning,  Aquatic Therapy, Dry Needling, Electrical stimulation, Spinal manipulation, Spinal mobilization, Cryotherapy, Moist heat, Taping, Traction, Ultrasound, Ionotophoresis 4mg /ml Dexamethasone, Manual therapy, and Re-evaluation  PLAN FOR NEXT SESSION: Strengthening, balance, flexibility, focus on proximal hip and core strength.     Clydie Braun Gretta Samons, PT, DPT 01/24/23, 8:35 AM  [facility] [address], [address] [place], [place] [address] Phone # [phone] Fax [phone]

## 2023-01-25 ENCOUNTER — Other Ambulatory Visit (INDEPENDENT_AMBULATORY_CARE_PROVIDER_SITE_OTHER): Payer: Medicare HMO

## 2023-01-25 DIAGNOSIS — D696 Thrombocytopenia, unspecified: Secondary | ICD-10-CM

## 2023-01-25 LAB — CBC WITH DIFFERENTIAL/PLATELET
Basophils Absolute: 0.1 10*3/uL (ref 0.0–0.1)
Basophils Relative: 1.2 % (ref 0.0–3.0)
Eosinophils Absolute: 0.1 10*3/uL (ref 0.0–0.7)
Eosinophils Relative: 1.4 % (ref 0.0–5.0)
HCT: 42.8 % (ref 39.0–52.0)
Hemoglobin: 14.6 g/dL (ref 13.0–17.0)
Lymphocytes Relative: 31.2 % (ref 12.0–46.0)
Lymphs Abs: 1.7 10*3/uL (ref 0.7–4.0)
MCHC: 34.2 g/dL (ref 30.0–36.0)
MCV: 88 fl (ref 78.0–100.0)
Monocytes Absolute: 0.4 10*3/uL (ref 0.1–1.0)
Monocytes Relative: 7.7 % (ref 3.0–12.0)
Neutro Abs: 3.1 10*3/uL (ref 1.4–7.7)
Neutrophils Relative %: 58.5 % (ref 43.0–77.0)
Platelets: 147 10*3/uL — ABNORMAL LOW (ref 150.0–400.0)
RBC: 4.86 Mil/uL (ref 4.22–5.81)
RDW: 13.5 % (ref 11.5–15.5)
WBC: 5.3 10*3/uL (ref 4.0–10.5)

## 2023-02-06 ENCOUNTER — Telehealth: Payer: Self-pay | Admitting: Adult Health

## 2023-02-06 ENCOUNTER — Ambulatory Visit: Payer: Medicare HMO | Admitting: Rehabilitative and Restorative Service Providers"

## 2023-02-06 NOTE — Telephone Encounter (Signed)
Please advise 

## 2023-02-06 NOTE — Telephone Encounter (Signed)
Patient sat on corner of the bed and accidentally slid to the floor.  Pt kind of fell the the side hitting his ribs.  They are very sore, hurting to move much.  Patient's wife was concerned on what to do.  Appt was scheduled for tomorrow at 1:00 to see [facility] with the understanding per Enrique Sack if they need to do something different before tomorrow that Enrique Sack will call them back.  Patient was supposed to have P/T today, however per Kendra's instructions they were told to hold off on P/T until getting evaluated by [facility].

## 2023-02-06 NOTE — Telephone Encounter (Signed)
Noted  

## 2023-02-07 ENCOUNTER — Ambulatory Visit (INDEPENDENT_AMBULATORY_CARE_PROVIDER_SITE_OTHER): Payer: Medicare HMO | Admitting: Adult Health

## 2023-02-07 ENCOUNTER — Encounter: Payer: Self-pay | Admitting: Adult Health

## 2023-02-07 ENCOUNTER — Ambulatory Visit (INDEPENDENT_AMBULATORY_CARE_PROVIDER_SITE_OTHER): Payer: Medicare HMO

## 2023-02-07 VITALS — BP 120/80 | HR 70 | Temp 98.2°F | Ht 78.0 in | Wt 243.0 lb

## 2023-02-07 DIAGNOSIS — R0781 Pleurodynia: Secondary | ICD-10-CM

## 2023-02-07 DIAGNOSIS — S2241XA Multiple fractures of ribs, right side, initial encounter for closed fracture: Secondary | ICD-10-CM | POA: Diagnosis not present

## 2023-02-07 DIAGNOSIS — R918 Other nonspecific abnormal finding of lung field: Secondary | ICD-10-CM | POA: Diagnosis not present

## 2023-02-07 DIAGNOSIS — C4490 Unspecified malignant neoplasm of skin, unspecified: Secondary | ICD-10-CM

## 2023-02-07 MED ORDER — TRAMADOL HCL 50 MG PO TABS
50.0000 mg | ORAL_TABLET | Freq: Every day | ORAL | 0 refills | Status: AC
Start: 2023-02-07 — End: 2023-02-12

## 2023-02-07 NOTE — Progress Notes (Signed)
Subjective:    Patient ID: Brandon [name], male    DOB: [birth year], 77 y.o.   MRN: [id number]  Fall  77 year old male who  has a past medical history of Allergy, Basal cell carcinoma, GERD (gastroesophageal reflux disease), History of kidney cancer, and Hypertension.  He presents to the office today for an acute issue.   He reports that he was at a hotel for a wedding and sat on the bed to change into his shorts and the bed collapsed and he fell onto his right side. He has had pain along the right mid back and flank since that time. Coughing, turning over at night in bed, riding in a car and going over bumps all cause pain. He has been using  Tylenol that helps with the pain to some degree  He also had SCC removed in this office about a month ago and has not heard from Dermatology yet     Review of Systems See HPI   Past Medical History:  Diagnosis Date  . Allergy   . Basal cell carcinoma   . GERD (gastroesophageal reflux disease)   . History of kidney cancer    benign; told not cancerous  . Hypertension     Social History   Socioeconomic History  . Marital status: Married    Spouse name: Not on file  . Number of children: Not on file  . Years of education: Not on file  . Highest education level: Bachelor's degree (e.g., BA, AB, BS)  Occupational History  . Not on file  Tobacco Use  . Smoking status: Former  . Smokeless tobacco: Never  Vaping Use  . Vaping status: Never Used  Substance and Sexual Activity  . Alcohol use: Not Currently    Comment: "Beer every once in a while"  . Drug use: No  . Sexual activity: Not Currently  Other Topics Concern  . 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 Difficulty of Paying Living  Expenses: Not hard at all  Food Insecurity: No Food Insecurity (05/01/2022)   Hunger Vital Sign   . Worried About Programme researcher, broadcasting/film/video in the Last Year: Never true   . Ran Out of Food in the Last Year: Never true  Transportation Needs: No Transportation Needs (05/01/2022)   PRAPARE - Transportation   . Lack of Transportation (Medical): No   . Lack of Transportation (Non-Medical): No  Physical Activity: Insufficiently Active (11/23/2021)   Exercise Vital Sign   . Days of Exercise per Week: 1 day   . Minutes of Exercise per Session: 10 min  Stress: No Stress Concern Present (11/23/2021)   [employer] of Occupational Health - Occupational Stress Questionnaire   . Feeling of Stress : Not at all  Social Connections: Unknown (03/21/2022)   Received from [facility], [facility]   Social Network   . Social Network: Not on file  Intimate Partner Violence: Unknown (03/21/2022)   Received from [facility], [facility]   HITS   . Physically Hurt: Not on file   . Insult or Talk Down To: Not on file   . Threaten Physical Harm: Not on file   .  Scream or Curse: Not on file    Past Surgical History:  Procedure Laterality Date  . CATARACT EXTRACTION Bilateral   . CRANIOTOMY Bilateral 12/25/2021   Procedure: CRANIOTOMY HEMATOMA EVACUATION SUBDURAL;  Surgeon: Julio Sicks, MD;  Location: [facility] OR;  Service: Neurosurgery;  Laterality: Bilateral;  . HERNIA REPAIR     x2  . KIDNEY SURGERY Right    Had right kidney removed.   . TONSILLECTOMY    . VASECTOMY      Family History  Problem Relation Age of Onset  . Cancer Mother        Breast   . Alzheimer's disease Mother   . Cancer Father        Lung   . Colon cancer Neg Hx     No Known Allergies  Current Outpatient Medications on File Prior to Visit  Medication Sig Dispense Refill  . acetaminophen (TYLENOL) 500 MG tablet Take 1 tablet (500 mg total) by mouth 2 (two) times daily as needed for mild pain. 30 tablet 0  . atorvastatin  (LIPITOR) 20 MG tablet Take 1 tablet (20 mg total) by mouth daily. 90 tablet 0  . B Complex Vitamins (VITAMIN B COMPLEX) TABS Take by mouth daily. 30 tablet 0  . butalbital-acetaminophen-caffeine (FIORICET) 50-325-40 MG tablet Take 1 tablet by mouth every 6 (six) hours as needed for headache. 10 tablet 0  . cetirizine (ZYRTEC) 10 MG tablet Take 10 mg by mouth daily.    . Cholecalciferol 25 MCG (1000 UT) tablet Take 1 tablet (1,000 Units total) by mouth daily. 30 tablet 0  . diclofenac Sodium (VOLTAREN) 1 % GEL Apply 4 g topically 3 (three) times daily. 100 g 0  . finasteride (PROSCAR) 5 MG tablet Take 1 tablet (5 mg total) by mouth daily. 90 tablet 0  . latanoprost (XALATAN) 0.005 % ophthalmic solution Place 1 drop into both eyes at bedtime.    Marland Kitchen lisinopril-hydrochlorothiazide (ZESTORETIC) 20-25 MG tablet Take 1 tablet by mouth daily. 90 tablet 0  . tamsulosin (FLOMAX) 0.4 MG CAPS capsule Take 1 capsule (0.4 mg total) by mouth 2 (two) times daily. 180 capsule 0  . traZODone (DESYREL) 100 MG tablet Take 1 tablet (100 mg total) by mouth at bedtime. 30 tablet 0  . vitamin C (ASCORBIC ACID) 500 MG tablet Take 500 mg by mouth daily.     No current facility-administered medications on file prior to visit.    BP 120/80   Pulse 70   Temp 98.2 F (36.8 C) (Oral)   Ht 6\' 6"  (1.981 m)   Wt 243 lb (110.2 kg)   SpO2 93%   BMI 28.08 kg/m       Objective:   Physical Exam Vitals and nursing note reviewed.  Constitutional:      Appearance: Normal appearance.  Musculoskeletal:        General: Tenderness present. Normal range of motion.  Skin:    General: Skin is warm and dry.  Neurological:     General: No focal deficit present.     Mental Status: He is alert and oriented to person, place, and time.  Psychiatric:        Mood and Affect: Mood normal.        Behavior: Behavior normal.        Thought Content: Thought content normal.        Judgment: Judgment normal.       Assessment &  Plan:  1. Rib pain on right side  -  DG Ribs Unilateral Right; Future - traMADol (ULTRAM) 50 MG tablet; Take 1 tablet (50 mg total) by mouth at bedtime for 5 days.  Dispense: 10 tablet; Refill: 0  2. Skin cancer  - Ambulatory referral to Dermatology

## 2023-02-08 ENCOUNTER — Other Ambulatory Visit: Payer: Self-pay | Admitting: Adult Health

## 2023-02-08 ENCOUNTER — Encounter: Payer: Medicare HMO | Admitting: Rehabilitative and Restorative Service Providers"

## 2023-02-08 DIAGNOSIS — I10 Essential (primary) hypertension: Secondary | ICD-10-CM

## 2023-02-12 ENCOUNTER — Encounter: Payer: Medicare HMO | Admitting: Rehabilitative and Restorative Service Providers"

## 2023-02-14 ENCOUNTER — Encounter: Payer: Medicare HMO | Admitting: Rehabilitative and Restorative Service Providers"

## 2023-02-19 ENCOUNTER — Encounter: Payer: Self-pay | Admitting: Rehabilitative and Restorative Service Providers"

## 2023-02-19 ENCOUNTER — Ambulatory Visit: Payer: Medicare HMO | Attending: Adult Health | Admitting: Rehabilitative and Restorative Service Providers"

## 2023-02-19 DIAGNOSIS — R293 Abnormal posture: Secondary | ICD-10-CM | POA: Diagnosis not present

## 2023-02-19 DIAGNOSIS — R2689 Other abnormalities of gait and mobility: Secondary | ICD-10-CM | POA: Insufficient documentation

## 2023-02-19 DIAGNOSIS — I10 Essential (primary) hypertension: Secondary | ICD-10-CM | POA: Insufficient documentation

## 2023-02-19 DIAGNOSIS — M6281 Muscle weakness (generalized): Secondary | ICD-10-CM | POA: Diagnosis not present

## 2023-02-19 DIAGNOSIS — R2681 Unsteadiness on feet: Secondary | ICD-10-CM | POA: Insufficient documentation

## 2023-02-19 DIAGNOSIS — S065XAA Traumatic subdural hemorrhage with loss of consciousness status unknown, initial encounter: Secondary | ICD-10-CM | POA: Insufficient documentation

## 2023-02-19 DIAGNOSIS — R262 Difficulty in walking, not elsewhere classified: Secondary | ICD-10-CM | POA: Diagnosis not present

## 2023-02-19 NOTE — Patient Instructions (Signed)
     [facility] Welcome to [facility]! Here you will find all the information you will need regarding your pool therapy. If you have further questions at any time, please call our office at [phone]. After completing your initial evaluation in the [facility], you may be eligible to complete a portion of your therapy in the pool. A typical week of therapy will consist of 1-2 typical physical therapy visits at our [facility] location and an additional session of therapy in the pool located at the [facility] at [address]. [address], [place] [address]. The phone number at the pool site is [phone]. Please call this number if you are running late or need to cancel your appointment.  Aquatic therapy will be offered on Wednesday mornings and Friday afternoons. Each session will last approximately 45 minutes. All scheduling and payments for aquatic therapy sessions, including cancelations, will be done through our [facility] location.  To be eligible for aquatic therapy, these criteria must be met: You must be able to independently change in the locker room and get to the pool deck. A caregiver can come with you to help if needed. There are benches for a caregiver to sit on next to the pool. No one with an open wound is permitted in the pool.  Handicap parking is available in the front and there is a drop off option for even closer accessibility. Please arrive 15 minutes prior to your appointment to prepare for your pool session. You must sign in at the front desk upon your arrival. Please be sure to attend to any toileting needs prior to entering the pool. Locker rooms for changing are available.  There is direct access to the pool deck from the locker room. You can lock your belongings in a locker or bring them with you poolside. Your therapist will greet you on the pool deck. There may be other swimmers in the pool at the  same time but your session is one-on-one with the therapist.   

## 2023-02-19 NOTE — Therapy (Signed)
OUTPATIENT PHYSICAL THERAPY TREATMENT NOTE   Patient Name: Brandon [name] MRN: [id number] DOB:01-29-1946, 77 y.o., male Today's Date: 02/19/2023  END OF SESSION:  PT End of Session - 02/19/23 1153     Visit Number 4    Date for PT Re-Evaluation 03/08/23    Authorization Type [employer]    Authorization Time Period 01/10/2023 - 03/08/2023    Authorization - Visit Number 4    Authorization - Number of Visits 16    Progress Note Due on Visit 10    PT Start Time 1145    PT Stop Time 1225    PT Time Calculation (min) 40 min    Activity Tolerance Patient tolerated treatment well    Behavior During Therapy WFL for tasks assessed/performed             Past Medical History:  Diagnosis Date   Allergy    Basal cell carcinoma    GERD (gastroesophageal reflux disease)    History of kidney cancer    benign; told not cancerous   Hypertension    Past Surgical History:  Procedure Laterality Date   CATARACT EXTRACTION Bilateral    CRANIOTOMY Bilateral 12/25/2021   Procedure: CRANIOTOMY HEMATOMA EVACUATION SUBDURAL;  Surgeon: Julio Sicks, MD;  Location: [facility] OR;  Service: Neurosurgery;  Laterality: Bilateral;   HERNIA REPAIR     x2   KIDNEY SURGERY Right    Had right kidney removed.    TONSILLECTOMY     VASECTOMY     Patient Active Problem List   Diagnosis Date Noted   Cognitive and neurobehavioral dysfunction following brain injury (HCC) 02/07/2022   Gait difficulty 02/07/2022   Traumatic brain injury with loss of consciousness (HCC) 12/30/2021   SDH (subdural hematoma) (HCC) 12/24/2021   Hyperlipidemia 02/13/2018   BPH (benign prostatic hyperplasia) 01/31/2017   Heart murmur 01/20/2016   HTN (hypertension) 03/09/2012    PCP: Shirline Frees, NP  REFERRING PROVIDER: Shirline Frees, NP  REFERRING DIAG: R26.81 (ICD-10-CM) - Gait instability  THERAPY DIAG:  Balance problem  Muscle weakness (generalized)  Unsteadiness on feet  Rationale for Evaluation and  Treatment: Rehabilitation  ONSET DATE: Pt and wife report that frequent falls have been occurring for the past year  SUBJECTIVE:   SUBJECTIVE STATEMENT: Patient reports that at the wedding, he was trying to put on his shorts and sat on the edge of the bed and the mattress sagged unexpectedly and he fell down.  Wife reports that she was wanting to try some aquatic appointments as well as land.  States pain is still 4/10 in his knees.   PERTINENT HISTORY: Subdural Hematoma, HTN, GERD, Basal Cell Carcinoma, Hx of benign kidney mass, OA  PAIN:  Are you having pain? Yes: NPRS scale: currently 4/10 Pain location: bilateral knees (right greater than left) Pain description: sore Aggravating factors: unknown Relieving factors: Voltaren gel  PRECAUTIONS: Fall  RED FLAGS: None   WEIGHT BEARING RESTRICTIONS: No  FALLS:  Has patient fallen in last 6 months? Yes. Number of falls 3  LIVING ENVIRONMENT: Lives with: lives with their spouse Lives in: House/apartment Stairs:  one level home, one step to enter porch Has following equipment at home: Single point cane, Environmental consultant - 2 wheeled, Environmental consultant - 4 wheeled, shower chair, and Grab bars  OCCUPATION: retired  PLOF: Requires assistive device for independence and Leisure: walking with wife, going out to eat, movies  PATIENT GOALS: To be able to walk into wedding venue for granddaughter's wedding on September  21st, 2024.  NEXT MD VISIT: Shirline Frees on 07/01/2022  OBJECTIVE:   DIAGNOSTIC FINDINGS: Head CT on 04/27/2023: IMPRESSION: 1. Stable 6 mm acute on chronic left subdural hematoma. No associated midline shift. 2. Stable 6 mm chronic right subdural hematoma. 3. No new intracranial abnormality. 4. No acute displaced fracture or traumatic listhesis of the cervical spine. 5. Persistent right apical subpleural 1.5 cm ground-glass nodular-like airspace opacity (increased in size from 2018). Adenocarcinoma is not excluded. Additional  imaging evaluation or consultation with Pulmonology or Thoracic Surgery recommended.  PATIENT SURVEYS:  Eval:  LEFS 20 / 80 = 25.0 %  COGNITION: Overall cognitive status:  wife reports occasionally pt has word finding problems and some memory impairments      SENSATION: Wife reports that sometimes left hand has "spasms"  MUSCLE LENGTH: Hamstrings: Tightness bilaterally  POSTURE: rounded shoulders, forward head, and flexed trunk    LOWER EXTREMITY ROM:  WFL  LOWER EXTREMITY MMT:  Eval: Right LE strength grossly 4-/5 Left LE strength grossly 4 to 4+/5 throughout  FUNCTIONAL TESTS:  Eval:   5 times sit to stand: 14.96 sec with UE use required Timed up and go (TUG): 13.71 sec without UE device with unsteadiness noted especially with 180 degree turn BERG:  27/56  01/24/2023: 3 minute walk:  378 ft with SPC (pt with shuffling gait pattern)  GAIT: Distance walked: >200 ft Assistive device utilized: Single point cane Level of assistance: SBA Comments: Pt with unsteady gait noted and requires SBA   TODAY'S TREATMENT:                                                                                                                               DATE: 02/19/2023 Nustep level 5 x6 min with PT present to discuss status Sit to/from stand 2x5 (with heavy reliance of UE) Seated with 5#:  LAQ and marching.  BLE 2x10 each Seated hip abduction clamshell with green tband 2x10 Seated hamstring curl with green tband 2x10 bilat Seated hamstring stretch x20 sec bilat Seated core series with 10# kettlebell:  hip to hip, hip to alt shoulder.  X10 bilat each Seated (unilateral UE) reaching forward to touch cone placed on stool 2x10 bilat Standing marching at barre 2x10 bilat Standing hip abduction at barre 2x10 bilat Standing hip extension at barre 2x10 bilat    DATE: 01/24/2023 Nustep level 5 x6 min with PT present to discuss status Seated with 5#:  LAQ and marching.  BLE 2x10  each Seated hip abduction clamshell with yellow loop 2x10 Seated hamstring curl with green tband 2x10 bilat Sit to/from stand 2x10 (with heavy reliance of UE) 3 min amb with SPC x378 ft with cuing for longer stride length secondary to short/shuffling steps Standing marching at barre 2x10 bilat Standing hip abduction at barre 2x10 bilat Standing hip extension at barre 2x10 bilat Seated hamstring stretch x20 sec bilat   DATE: 01/17/2023  Nustep x 5 min level  4 Patient had to take bathroom break after Nustep Standing hamstring stretch at steps 2 x 30 sec each LE Standing quad/hip flexor stretch 2 x 30 sec each LE Seated LAQ 2 x 10 with 5lb ankle weights Seated clam x 20 with yellow loop Sit to stand with 2 balance pads in chair x 10 At barre: standing hip abduction and extension 2 x 10 Lateral band walks with green loop x 4 laps at barre Leg Press 100 lbs seat 9 (will need help getting legs up) x 20    PATIENT EDUCATION:  Education details: Issued HEP Person educated: Patient and Spouse Education method: Explanation, Demonstration, and Handouts Education comprehension: verbalized understanding  HOME EXERCISE PROGRAM: Access Code: [email or web address] URL: [email or web address] Date: 01/10/2023 Prepared by: Clydie Braun Jenasis Straley  Exercises - Sit to Stand  - 1 x daily - 7 x weekly - 2 sets - 10 reps - Standing Hip Abduction with Counter Support  - 1 x daily - 7 x weekly - 2 sets - 10 reps - Standing Hip Extension with Counter Support  - 1 x daily - 7 x weekly - 2 sets - 10 reps - Standing March with Counter Support  - 1 x daily - 7 x weekly - 2 sets - 10 reps - Heel Raises with Counter Support  - 1 x daily - 7 x weekly - 2 sets - 10 reps  ASSESSMENT:  CLINICAL IMPRESSION: Mr Orsak presents to skilled PT with [facility] with wife reporting patient had a fall during the wedding weekend where he slipped off the side of the bed.  Patient with difficulty sitting secondary to decreased core  stability, so added in some exercises today to address core weakness.  Patient wanted to work with some aquatics PT, so visits were rescheduled to include aquatics.  Patient continues to progress with strengthening and core stability.  OBJECTIVE IMPAIRMENTS: decreased balance, difficulty walking, decreased strength, impaired flexibility, postural dysfunction, and pain.   ACTIVITY LIMITATIONS: carrying, lifting, bending, standing, squatting, and stairs  PARTICIPATION LIMITATIONS: cleaning and community activity  PERSONAL FACTORS: Past/current experiences, Time since onset of injury/illness/exacerbation, and 3+ comorbidities: subdural hematoma, HTN, OA  are also affecting patient's functional outcome.   REHAB POTENTIAL: Good  CLINICAL DECISION MAKING: Evolving/moderate complexity  EVALUATION COMPLEXITY: Moderate   GOALS: Goals reviewed with patient? Yes  SHORT TERM GOALS: Target date: 02/01/2023 Pt will be independent with initial HEP. Baseline: Goal status: MET  2.  Pt will improve ambulation safety with least restrictive assistive device to allow him to safely ambulate distances required for granddaughter's wedding on 02/02/2023. Baseline:  Goal status: Ongoing   LONG TERM GOALS: Target date: 03/08/2023  Pt will be independent with advanced HEP to allow for self progression post discharge. Baseline:  Goal status: INITIAL  2.  Patient will increase Lower Extremity Functional Scale to at least 40% to demonstrate improvement in functional tasks. Baseline: 25% Goal status: INITIAL  3.  Patient will increase BERG to at least 40/56 to demonstrate decreased risk of falling. Baseline: 27/56 Goal status: INITIAL  4.  Pt will report being able to ambulate at least 20 minutes with LRAD without increased pain and no loss of balance to allow for community ambulation. Baseline:  Goal status: INITIAL  5.  Pt will increase LE strength to at least 4+/5 to allow him to perform sit to  stand without UE use and navigate a curb with LRAD safely. Baseline:  Goal status: INITIAL   PLAN:  PT FREQUENCY: 2x/week  PT DURATION: 8 weeks  PLANNED INTERVENTIONS: Therapeutic exercises, Therapeutic activity, Neuromuscular re-education, Balance training, Gait training, Patient/Family education, Self Care, Joint mobilization, Joint manipulation, Stair training, Vestibular training, Canalith repositioning, Aquatic Therapy, Dry Needling, Electrical stimulation, Spinal manipulation, Spinal mobilization, Cryotherapy, Moist heat, Taping, Traction, Ultrasound, Ionotophoresis 4mg /ml Dexamethasone, Manual therapy, and Re-evaluation  PLAN FOR NEXT SESSION: Strengthening, balance, flexibility, focus on proximal hip and core strength.     Reather Laurence, PT, DPT 02/19/23, 12:46 PM  [facility] [address], [address] [place], [place] [address] Phone # [phone] Fax [phone]

## 2023-02-21 ENCOUNTER — Encounter: Payer: Medicare HMO | Admitting: Rehabilitative and Restorative Service Providers"

## 2023-02-22 ENCOUNTER — Encounter: Payer: Self-pay | Admitting: Physical Therapy

## 2023-02-22 ENCOUNTER — Ambulatory Visit: Payer: Medicare HMO | Admitting: Physical Therapy

## 2023-02-22 DIAGNOSIS — R2681 Unsteadiness on feet: Secondary | ICD-10-CM | POA: Diagnosis not present

## 2023-02-22 DIAGNOSIS — R2689 Other abnormalities of gait and mobility: Secondary | ICD-10-CM | POA: Diagnosis not present

## 2023-02-22 DIAGNOSIS — M6281 Muscle weakness (generalized): Secondary | ICD-10-CM

## 2023-02-22 DIAGNOSIS — R293 Abnormal posture: Secondary | ICD-10-CM

## 2023-02-22 DIAGNOSIS — S065XAA Traumatic subdural hemorrhage with loss of consciousness status unknown, initial encounter: Secondary | ICD-10-CM | POA: Diagnosis not present

## 2023-02-22 DIAGNOSIS — R262 Difficulty in walking, not elsewhere classified: Secondary | ICD-10-CM | POA: Diagnosis not present

## 2023-02-22 DIAGNOSIS — I10 Essential (primary) hypertension: Secondary | ICD-10-CM | POA: Diagnosis not present

## 2023-02-22 NOTE — Therapy (Signed)
OUTPATIENT PHYSICAL THERAPY TREATMENT NOTE   Patient Name: Brandon [name] MRN: [id number] DOB:10-02-1945, 77 y.o., male Today's Date: 02/22/2023  END OF SESSION:  PT End of Session - 02/22/23 1549     Visit Number 5    Date for PT Re-Evaluation 03/08/23    Authorization Type [employer]    Authorization Time Period 01/10/2023 - 03/08/2023    Authorization - Visit Number 5    Authorization - Number of Visits 16    Progress Note Due on Visit 10    PT Start Time 1430    PT Stop Time 1515    PT Time Calculation (min) 45 min    Activity Tolerance Patient tolerated treatment well    Behavior During Therapy WFL for tasks assessed/performed              Past Medical History:  Diagnosis Date   Allergy    Basal cell carcinoma    GERD (gastroesophageal reflux disease)    History of kidney cancer    benign; told not cancerous   Hypertension    Past Surgical History:  Procedure Laterality Date   CATARACT EXTRACTION Bilateral    CRANIOTOMY Bilateral 12/25/2021   Procedure: CRANIOTOMY HEMATOMA EVACUATION SUBDURAL;  Surgeon: Julio Sicks, MD;  Location: [facility] OR;  Service: Neurosurgery;  Laterality: Bilateral;   HERNIA REPAIR     x2   KIDNEY SURGERY Right    Had right kidney removed.    TONSILLECTOMY     VASECTOMY     Patient Active Problem List   Diagnosis Date Noted   Cognitive and neurobehavioral dysfunction following brain injury (HCC) 02/07/2022   Gait difficulty 02/07/2022   Traumatic brain injury with loss of consciousness (HCC) 12/30/2021   SDH (subdural hematoma) (HCC) 12/24/2021   Hyperlipidemia 02/13/2018   BPH (benign prostatic hyperplasia) 01/31/2017   Heart murmur 01/20/2016   HTN (hypertension) 03/09/2012    PCP: Shirline Frees, NP  REFERRING PROVIDER: Shirline Frees, NP  REFERRING DIAG: R26.81 (ICD-10-CM) - Gait instability  THERAPY DIAG:  Balance problem  Muscle weakness (generalized)  Unsteadiness on feet  Difficulty in walking, not  elsewhere classified  Abnormal posture  Rationale for Evaluation and Treatment: Rehabilitation  ONSET DATE: Pt and wife report that frequent falls have been occurring for the past year  SUBJECTIVE:   SUBJECTIVE STATEMENT: Pt arrives to aquatic PT accompanied by his wife and using a standard cane.  PERTINENT HISTORY: Subdural Hematoma, HTN, GERD, Basal Cell Carcinoma, Hx of benign kidney mass, OA  PAIN:  Are you having pain? Yes: NPRS scale: currently 4/10 Pain location: bilateral knees (right greater than left) Pain description: sore Aggravating factors: unknown Relieving factors: Voltaren gel  PRECAUTIONS: Fall  RED FLAGS: None   WEIGHT BEARING RESTRICTIONS: No  FALLS:  Has patient fallen in last 6 months? Yes. Number of falls 3  LIVING ENVIRONMENT: Lives with: lives with their spouse Lives in: House/apartment Stairs:  one level home, one step to enter porch Has following equipment at home: Single point cane, Environmental consultant - 2 wheeled, Environmental consultant - 4 wheeled, shower chair, and Grab bars  OCCUPATION: retired  PLOF: Requires assistive device for independence and Leisure: walking with wife, going out to eat, movies  PATIENT GOALS: To be able to walk into wedding venue for granddaughter's wedding on September 21st, 2024.  NEXT MD VISIT: Shirline Frees on 07/01/2022  OBJECTIVE:   DIAGNOSTIC FINDINGS: Head CT on 04/27/2023: IMPRESSION: 1. Stable 6 mm acute on chronic left subdural hematoma. No  associated midline shift. 2. Stable 6 mm chronic right subdural hematoma. 3. No new intracranial abnormality. 4. No acute displaced fracture or traumatic listhesis of the cervical spine. 5. Persistent right apical subpleural 1.5 cm ground-glass nodular-like airspace opacity (increased in size from 2018). Adenocarcinoma is not excluded. Additional imaging evaluation or consultation with Pulmonology or Thoracic Surgery recommended.  PATIENT SURVEYS:  Eval:  LEFS 20 / 80 = 25.0  %  COGNITION: Overall cognitive status:  wife reports occasionally pt has word finding problems and some memory impairments      SENSATION: Wife reports that sometimes left hand has "spasms"  MUSCLE LENGTH: Hamstrings: Tightness bilaterally  POSTURE: rounded shoulders, forward head, and flexed trunk    LOWER EXTREMITY ROM:  WFL  LOWER EXTREMITY MMT:  Eval: Right LE strength grossly 4-/5 Left LE strength grossly 4 to 4+/5 throughout  FUNCTIONAL TESTS:  Eval:   5 times sit to stand: 14.96 sec with UE use required Timed up and go (TUG): 13.71 sec without UE device with unsteadiness noted especially with 180 degree turn BERG:  27/56  01/24/2023: 3 minute walk:  378 ft with SPC (pt with shuffling gait pattern)  GAIT: Distance walked: >200 ft Assistive device utilized: Single point cane Level of assistance: SBA Comments: Pt with unsteady gait noted and requires SBA   TODAY'S TREATMENT:    02/22/23:Pt arrives for aquatic physical therapy. Treatment took place in 3.5-5.5 feet of water. Water temperature was. Pt entered the pool via stairs step to step with heavy use of the rails. Pt requires buoyancy of water for support and to offload joints with strengthening exercises.   Seated water bench with 75% submersion Pt performed seated LE AROM exercises 20x in all planes,concurrent discussion of status followed by verbal education on water principles and how we would be using the. Pt verbally understood. 50% water depth due to pts height water walking 4x iin each direction with SBA and CGA at times due to pt instability. Static standing holding onto yellow noodle: marching 10x/ SBA. Standing with arms forward & back 10x VC to hold balance steady. Added perturbations from all direction 30 sec using kickboard. Hip Abd Bil 10x holding pool wall 10x each, heel raises 10x2.                                                                                                                          DATE: 02/19/2023 Nustep level 5 x6 min with PT present to discuss status Sit to/from stand 2x5 (with heavy reliance of UE) Seated with 5#:  LAQ and marching.  BLE 2x10 each Seated hip abduction clamshell with green tband 2x10 Seated hamstring curl with green tband 2x10 bilat Seated hamstring stretch x20 sec bilat Seated core series with 10# kettlebell:  hip to hip, hip to alt shoulder.  X10 bilat each Seated (unilateral UE) reaching forward to touch cone placed on stool 2x10 bilat Standing marching at barre 2x10 bilat Standing hip abduction at barre  2x10 bilat Standing hip extension at barre 2x10 bilat     PATIENT EDUCATION:  Education details: Issued HEP Person educated: Patient and Spouse Education method: Explanation, Demonstration, and Handouts Education comprehension: verbalized understanding  HOME EXERCISE PROGRAM: Access Code: [email or web address] URL: [email or web address] Date: 01/10/2023 Prepared by: Clydie Braun Menke  Exercises - Sit to Stand  - 1 x daily - 7 x weekly - 2 sets - 10 reps - Standing Hip Abduction with Counter Support  - 1 x daily - 7 x weekly - 2 sets - 10 reps - Standing Hip Extension with Counter Support  - 1 x daily - 7 x weekly - 2 sets - 10 reps - Standing March with Counter Support  - 1 x daily - 7 x weekly - 2 sets - 10 reps - Heel Raises with Counter Support  - 1 x daily - 7 x weekly - 2 sets - 10 reps  ASSESSMENT:  CLINICAL IMPRESSION:Pt arrives for first aquatic session accompanied by his wife. Pt ambulated with standard cane but appears unsteady. Pt was educated in water principles nad how we would use them to work on his balance. Pt understood all. He also required close SBA or slight CGA for balance througout the duration of todays session. Pt reported his knees felt good in th ewater.   OBJECTIVE IMPAIRMENTS: decreased balance, difficulty walking, decreased strength, impaired flexibility, postural dysfunction, and pain.   ACTIVITY  LIMITATIONS: carrying, lifting, bending, standing, squatting, and stairs  PARTICIPATION LIMITATIONS: cleaning and community activity  PERSONAL FACTORS: Past/current experiences, Time since onset of injury/illness/exacerbation, and 3+ comorbidities: subdural hematoma, HTN, OA  are also affecting patient's functional outcome.   REHAB POTENTIAL: Good  CLINICAL DECISION MAKING: Evolving/moderate complexity  EVALUATION COMPLEXITY: Moderate   GOALS: Goals reviewed with patient? Yes  SHORT TERM GOALS: Target date: 02/01/2023 Pt will be independent with initial HEP. Baseline: Goal status: MET  2.  Pt will improve ambulation safety with least restrictive assistive device to allow him to safely ambulate distances required for granddaughter's wedding on 02/02/2023. Baseline:  Goal status: Ongoing   LONG TERM GOALS: Target date: 03/08/2023  Pt will be independent with advanced HEP to allow for self progression post discharge. Baseline:  Goal status: INITIAL  2.  Patient will increase Lower Extremity Functional Scale to at least 40% to demonstrate improvement in functional tasks. Baseline: 25% Goal status: INITIAL  3.  Patient will increase BERG to at least 40/56 to demonstrate decreased risk of falling. Baseline: 27/56 Goal status: INITIAL  4.  Pt will report being able to ambulate at least 20 minutes with LRAD without increased pain and no loss of balance to allow for community ambulation. Baseline:  Goal status: INITIAL  5.  Pt will increase LE strength to at least 4+/5 to allow him to perform sit to stand without UE use and navigate a curb with LRAD safely. Baseline:  Goal status: INITIAL   PLAN:  PT FREQUENCY: 2x/week  PT DURATION: 8 weeks  PLANNED INTERVENTIONS: Therapeutic exercises, Therapeutic activity, Neuromuscular re-education, Balance training, Gait training, Patient/Family education, Self Care, Joint mobilization, Joint manipulation, Stair training, Vestibular  training, Canalith repositioning, Aquatic Therapy, Dry Needling, Electrical stimulation, Spinal manipulation, Spinal mobilization, Cryotherapy, Moist heat, Taping, Traction, Ultrasound, Ionotophoresis 4mg /ml Dexamethasone, Manual therapy, and Re-evaluation  PLAN FOR NEXT SESSION: Strengthening, balance, flexibility, focus on proximal hip and core strength.     Ane Payment, PTA 02/22/23 3:50 PM   [facility] [address] [place], [place]  [address] Phone # [phone] Fax [phone]

## 2023-02-26 ENCOUNTER — Encounter: Payer: Self-pay | Admitting: Rehabilitative and Restorative Service Providers"

## 2023-02-26 ENCOUNTER — Ambulatory Visit: Payer: Medicare HMO | Admitting: Rehabilitative and Restorative Service Providers"

## 2023-02-26 DIAGNOSIS — M6281 Muscle weakness (generalized): Secondary | ICD-10-CM | POA: Diagnosis not present

## 2023-02-26 DIAGNOSIS — R293 Abnormal posture: Secondary | ICD-10-CM | POA: Diagnosis not present

## 2023-02-26 DIAGNOSIS — S065XAA Traumatic subdural hemorrhage with loss of consciousness status unknown, initial encounter: Secondary | ICD-10-CM | POA: Diagnosis not present

## 2023-02-26 DIAGNOSIS — R2681 Unsteadiness on feet: Secondary | ICD-10-CM | POA: Diagnosis not present

## 2023-02-26 DIAGNOSIS — R2689 Other abnormalities of gait and mobility: Secondary | ICD-10-CM

## 2023-02-26 DIAGNOSIS — R262 Difficulty in walking, not elsewhere classified: Secondary | ICD-10-CM | POA: Diagnosis not present

## 2023-02-26 DIAGNOSIS — I10 Essential (primary) hypertension: Secondary | ICD-10-CM | POA: Diagnosis not present

## 2023-02-26 NOTE — Therapy (Signed)
OUTPATIENT PHYSICAL THERAPY TREATMENT NOTE   Patient Name: Brandon [name] MRN: [id number] DOB:1946/01/23, 77 y.o., male Today's Date: 02/26/2023  END OF SESSION:  PT End of Session - 02/26/23 1234     Visit Number 6    Date for PT Re-Evaluation 03/08/23    Authorization Type [employer]    Authorization Time Period 01/10/2023 - 03/08/2023    Authorization - Visit Number 6    Authorization - Number of Visits 16    Progress Note Due on Visit 10    PT Start Time 1231    PT Stop Time 1310    PT Time Calculation (min) 39 min    Activity Tolerance Patient tolerated treatment well    Behavior During Therapy WFL for tasks assessed/performed              Past Medical History:  Diagnosis Date   Allergy    Basal cell carcinoma    GERD (gastroesophageal reflux disease)    History of kidney cancer    benign; told not cancerous   Hypertension    Past Surgical History:  Procedure Laterality Date   CATARACT EXTRACTION Bilateral    CRANIOTOMY Bilateral 12/25/2021   Procedure: CRANIOTOMY HEMATOMA EVACUATION SUBDURAL;  Surgeon: Julio Sicks, MD;  Location: [facility] OR;  Service: Neurosurgery;  Laterality: Bilateral;   HERNIA REPAIR     x2   KIDNEY SURGERY Right    Had right kidney removed.    TONSILLECTOMY     VASECTOMY     Patient Active Problem List   Diagnosis Date Noted   Cognitive and neurobehavioral dysfunction following brain injury (HCC) 02/07/2022   Gait difficulty 02/07/2022   Traumatic brain injury with loss of consciousness (HCC) 12/30/2021   SDH (subdural hematoma) (HCC) 12/24/2021   Hyperlipidemia 02/13/2018   BPH (benign prostatic hyperplasia) 01/31/2017   Heart murmur 01/20/2016   HTN (hypertension) 03/09/2012    PCP: Shirline Frees, NP  REFERRING PROVIDER: Shirline Frees, NP  REFERRING DIAG: R26.81 (ICD-10-CM) - Gait instability  THERAPY DIAG:  Balance problem  Muscle weakness (generalized)  Unsteadiness on feet  Rationale for Evaluation and  Treatment: Rehabilitation  ONSET DATE: Pt and wife report that frequent falls have been occurring for the past year  SUBJECTIVE:   SUBJECTIVE STATEMENT:  Patient reports that aquatic PT went well, states "it was cool!"  PERTINENT HISTORY: Subdural Hematoma, HTN, GERD, Basal Cell Carcinoma, Hx of benign kidney mass, OA  PAIN:  Are you having pain? Yes: NPRS scale: 4-6/10 Pain location: bilateral knees (right greater than left) Pain description: sore Aggravating factors: unknown Relieving factors: Voltaren gel  PRECAUTIONS: Fall  RED FLAGS: None   WEIGHT BEARING RESTRICTIONS: No  FALLS:  Has patient fallen in last 6 months? Yes. Number of falls 3  LIVING ENVIRONMENT: Lives with: lives with their spouse Lives in: House/apartment Stairs:  one level home, one step to enter porch Has following equipment at home: Single point cane, Environmental consultant - 2 wheeled, Environmental consultant - 4 wheeled, shower chair, and Grab bars  OCCUPATION: retired  PLOF: Requires assistive device for independence and Leisure: walking with wife, going out to eat, movies  PATIENT GOALS: To be able to walk into wedding venue for granddaughter's wedding on September 21st, 2024.  NEXT MD VISIT: Shirline Frees on 07/01/2022  OBJECTIVE:   DIAGNOSTIC FINDINGS: Head CT on 04/27/2023: IMPRESSION: 1. Stable 6 mm acute on chronic left subdural hematoma. No associated midline shift. 2. Stable 6 mm chronic right subdural hematoma. 3. No  new intracranial abnormality. 4. No acute displaced fracture or traumatic listhesis of the cervical spine. 5. Persistent right apical subpleural 1.5 cm ground-glass nodular-like airspace opacity (increased in size from 2018). Adenocarcinoma is not excluded. Additional imaging evaluation or consultation with Pulmonology or Thoracic Surgery recommended.  PATIENT SURVEYS:  Eval:  LEFS 20 / 80 = 25.0 %  COGNITION: Overall cognitive status:  wife reports occasionally pt has word finding  problems and some memory impairments      SENSATION: Wife reports that sometimes left hand has "spasms"  MUSCLE LENGTH: Hamstrings: Tightness bilaterally  POSTURE: rounded shoulders, forward head, and flexed trunk    LOWER EXTREMITY ROM:  WFL  LOWER EXTREMITY MMT:  Eval: Right LE strength grossly 4-/5 Left LE strength grossly 4 to 4+/5 throughout  FUNCTIONAL TESTS:  Eval:   5 times sit to stand: 14.96 sec with UE use required Timed up and go (TUG): 13.71 sec without UE device with unsteadiness noted especially with 180 degree turn BERG:  27/56  01/24/2023: 3 minute walk:  378 ft with SPC (pt with shuffling gait pattern)  02/26/2023: Timed up and go (TUG): 11.69 sec 5 times sit to stand: 13.28 sec with UE use required 3 minute walk: 470 ft without assistive device  GAIT: Distance walked: >200 ft Assistive device utilized: Single point cane Level of assistance: SBA Comments: Pt with unsteady gait noted and requires SBA   TODAY'S TREATMENT:                                                                                                                     DATE:  02/26/2023 Nustep level 5 x6 min with PT present to discuss status Times up and Go, 5 times sit to stand 3 minute walk for 470 ft without assistive device Seated with 5#:  LAQ and marching.  BLE 2x10 each Seated hip abduction clamshell with green tband 2x10 Seated hamstring curl with green tband 2x10 bilat Seated (unilateral UE) reaching forward to touch cone placed on stool 2x10 bilat Standing marching at barre 2x10 bilat Leg Press (seat at 9) 110# 2x10 Standing rows with 15# cable pulley x12   02/22/23: Pt arrives for aquatic physical therapy. Treatment took place in 3.5-5.5 feet of water. Water temperature was. Pt entered the pool via stairs step to step with heavy use of the rails. Pt requires buoyancy of water for support and to offload joints with strengthening exercises.   Seated water bench with  75% submersion Pt performed seated LE AROM exercises 20x in all planes,concurrent discussion of status followed by verbal education on water principles and how we would be using the. Pt verbally understood. 50% water depth due to pts height water walking 4x iin each direction with SBA and CGA at times due to pt instability. Static standing holding onto yellow noodle: marching 10x/ SBA. Standing with arms forward & back 10x VC to hold balance steady. Added perturbations from all direction 30 sec using kickboard. Hip Abd Bil 10x holding pool  wall 10x each, heel raises 10x2.        02/19/2023 Nustep level 5 x6 min with PT present to discuss status Sit to/from stand 2x5 (with heavy reliance of UE) Seated with 5#:  LAQ and marching.  BLE 2x10 each Seated hip abduction clamshell with green tband 2x10 Seated hamstring curl with green tband 2x10 bilat Seated hamstring stretch x20 sec bilat Seated core series with 10# kettlebell:  hip to hip, hip to alt shoulder.  X10 bilat each Seated (unilateral UE) reaching forward to touch cone placed on stool 2x10 bilat Standing marching at barre 2x10 bilat Standing hip abduction at barre 2x10 bilat Standing hip extension at barre 2x10 bilat     PATIENT EDUCATION:  Education details: Issued HEP Person educated: Patient and Spouse Education method: Explanation, Demonstration, and Handouts Education comprehension: verbalized understanding  HOME EXERCISE PROGRAM: Access Code: [email or web address] URL: [email or web address] Date: 01/10/2023 Prepared by: Clydie Braun Mylena Sedberry  Exercises - Sit to Stand  - 1 x daily - 7 x weekly - 2 sets - 10 reps - Standing Hip Abduction with Counter Support  - 1 x daily - 7 x weekly - 2 sets - 10 reps - Standing Hip Extension with Counter Support  - 1 x daily - 7 x weekly - 2 sets - 10 reps - Standing March with Counter Support  - 1 x daily - 7 x weekly - 2 sets - 10 reps - Heel Raises with Counter Support  - 1 x daily - 7 x  weekly - 2 sets - 10 reps  ASSESSMENT:  CLINICAL IMPRESSION: Mr Eisenberger presents to skilled PT reporting that he enjoyed the pool exercises.  Patient with good improvement noted with functional tests that were reassessed today.  Patient able to ambulate throughout session without SPC with improved safety noted.  Patient does still have some foot drag/shuffling noted on RLE during ambulation, especially as he fatigues.  Patient wanted to try to utilize weight machines, so performed some of these in session.  Patient required close SBA and cuing for safety during exercises on weight machines, so advised him to hold on performing these independently in the gym.  Patient continues to require skilled PT and continues to require aquatic PT to continue to progress towards decreased fall risk.   OBJECTIVE IMPAIRMENTS: decreased balance, difficulty walking, decreased strength, impaired flexibility, postural dysfunction, and pain.   ACTIVITY LIMITATIONS: carrying, lifting, bending, standing, squatting, and stairs  PARTICIPATION LIMITATIONS: cleaning and community activity  PERSONAL FACTORS: Past/current experiences, Time since onset of injury/illness/exacerbation, and 3+ comorbidities: subdural hematoma, HTN, OA  are also affecting patient's functional outcome.   REHAB POTENTIAL: Good  CLINICAL DECISION MAKING: Evolving/moderate complexity  EVALUATION COMPLEXITY: Moderate   GOALS: Goals reviewed with patient? Yes  SHORT TERM GOALS: Target date: 02/01/2023 Pt will be independent with initial HEP. Baseline: Goal status: MET  2.  Pt will improve ambulation safety with least restrictive assistive device to allow him to safely ambulate distances required for granddaughter's wedding on 02/02/2023. Baseline:  Goal status: Met   LONG TERM GOALS: Target date: 03/08/2023  Pt will be independent with advanced HEP to allow for self progression post discharge. Baseline:  Goal status: Ongoing  2.   Patient will increase Lower Extremity Functional Scale to at least 40% to demonstrate improvement in functional tasks. Baseline: 25% Goal status: Ongoing  3.  Patient will increase BERG to at least 40/56 to demonstrate decreased risk of falling. Baseline: 27/56 Goal status: INITIAL  4.  Pt will report being able to ambulate at least 20 minutes with LRAD without increased pain and no loss of balance to allow for community ambulation. Baseline:  Goal status: Ongoing  5.  Pt will increase LE strength to at least 4+/5 to allow him to perform sit to stand without UE use and navigate a curb with LRAD safely. Baseline:  Goal status: INITIAL   PLAN:  PT FREQUENCY: 2x/week  PT DURATION: 8 weeks  PLANNED INTERVENTIONS: Therapeutic exercises, Therapeutic activity, Neuromuscular re-education, Balance training, Gait training, Patient/Family education, Self Care, Joint mobilization, Joint manipulation, Stair training, Vestibular training, Canalith repositioning, Aquatic Therapy, Dry Needling, Electrical stimulation, Spinal manipulation, Spinal mobilization, Cryotherapy, Moist heat, Taping, Traction, Ultrasound, Ionotophoresis 4mg /ml Dexamethasone, Manual therapy, and Re-evaluation  PLAN FOR NEXT SESSION: Strengthening, balance, flexibility, focus on proximal hip and core strength, aquatics   Reather Laurence, PT, DPT 02/26/23, 1:29 PM  [facility] [address] [place], [place] [address] Phone # [phone] Fax [phone]

## 2023-02-28 ENCOUNTER — Telehealth: Payer: Self-pay | Admitting: Adult Health

## 2023-02-28 NOTE — Telephone Encounter (Signed)
Spoke and advised of Xray results which showed   Minimally displaced fractures eighth through eleventh ribs on the right laterally with evidence of hemothorax or pleural effusion. No pneumothorax identified.  21 days ago the xray was done - since that time he is feeling much better and the pain has improved greatly. Marland Kitchen He does not have any shortness of breath

## 2023-02-28 NOTE — Telephone Encounter (Signed)
Brandon [name] with the [facility] has a quick question for Brandon [name] regarding Brandon [name]" Male, 77 y.o., 1945/10/26 [phone] MRN: [id number]  LL unavailable Please return her call at your earliest convenience.  [phone]

## 2023-03-01 ENCOUNTER — Ambulatory Visit: Payer: Medicare HMO | Admitting: Physical Therapy

## 2023-03-01 ENCOUNTER — Encounter: Payer: Self-pay | Admitting: Physical Therapy

## 2023-03-01 DIAGNOSIS — M6281 Muscle weakness (generalized): Secondary | ICD-10-CM | POA: Diagnosis not present

## 2023-03-01 DIAGNOSIS — R293 Abnormal posture: Secondary | ICD-10-CM

## 2023-03-01 DIAGNOSIS — I10 Essential (primary) hypertension: Secondary | ICD-10-CM | POA: Diagnosis not present

## 2023-03-01 DIAGNOSIS — R262 Difficulty in walking, not elsewhere classified: Secondary | ICD-10-CM

## 2023-03-01 DIAGNOSIS — R2689 Other abnormalities of gait and mobility: Secondary | ICD-10-CM

## 2023-03-01 DIAGNOSIS — S065XAA Traumatic subdural hemorrhage with loss of consciousness status unknown, initial encounter: Secondary | ICD-10-CM

## 2023-03-01 DIAGNOSIS — R2681 Unsteadiness on feet: Secondary | ICD-10-CM

## 2023-03-01 NOTE — Therapy (Signed)
OUTPATIENT PHYSICAL THERAPY TREATMENT NOTE   Patient Name: Brandon [name] MRN: [id number] DOB:03/08/1946, 77 y.o., male Today's Date: 03/01/2023  END OF SESSION:  PT End of Session - 03/01/23 1513     Visit Number 7    Date for PT Re-Evaluation 03/08/23    Authorization Type [employer]    Authorization Time Period 01/10/2023 - 03/08/2023    Authorization - Visit Number 7    Authorization - Number of Visits 16    Progress Note Due on Visit 10    PT Start Time 1515    PT Stop Time 1600    PT Time Calculation (min) 45 min    Activity Tolerance Patient tolerated treatment well    Behavior During Therapy WFL for tasks assessed/performed               Past Medical History:  Diagnosis Date   Allergy    Basal cell carcinoma    GERD (gastroesophageal reflux disease)    History of kidney cancer    benign; told not cancerous   Hypertension    Past Surgical History:  Procedure Laterality Date   CATARACT EXTRACTION Bilateral    CRANIOTOMY Bilateral 12/25/2021   Procedure: CRANIOTOMY HEMATOMA EVACUATION SUBDURAL;  Surgeon: Julio Sicks, MD;  Location: [facility] OR;  Service: Neurosurgery;  Laterality: Bilateral;   HERNIA REPAIR     x2   KIDNEY SURGERY Right    Had right kidney removed.    TONSILLECTOMY     VASECTOMY     Patient Active Problem List   Diagnosis Date Noted   Cognitive and neurobehavioral dysfunction following brain injury (HCC) 02/07/2022   Gait difficulty 02/07/2022   Traumatic brain injury with loss of consciousness (HCC) 12/30/2021   SDH (subdural hematoma) (HCC) 12/24/2021   Hyperlipidemia 02/13/2018   BPH (benign prostatic hyperplasia) 01/31/2017   Heart murmur 01/20/2016   HTN (hypertension) 03/09/2012    PCP: Shirline Frees, NP  REFERRING PROVIDER: Shirline Frees, NP  REFERRING DIAG: R26.81 (ICD-10-CM) - Gait instability  THERAPY DIAG:  Balance problem  Muscle weakness (generalized)  Unsteadiness on feet  Difficulty in walking, not  elsewhere classified  Abnormal posture  SDH (subdural hematoma) (HCC)  Essential hypertension  Rationale for Evaluation and Treatment: Rehabilitation  ONSET DATE: Pt and wife report that frequent falls have been occurring for the past year  SUBJECTIVE:   SUBJECTIVE STATEMENT:  Felt good after both of my PT appointments.  PERTINENT HISTORY: Subdural Hematoma, HTN, GERD, Basal Cell Carcinoma, Hx of benign kidney mass, OA  PAIN:  Are you having pain? Yes: NPRS scale: 4-6/10 Pain location: bilateral knees (right greater than left) Pain description: sore Aggravating factors: unknown Relieving factors: Voltaren gel  PRECAUTIONS: Fall  RED FLAGS: None   WEIGHT BEARING RESTRICTIONS: No  FALLS:  Has patient fallen in last 6 months? Yes. Number of falls 3  LIVING ENVIRONMENT: Lives with: lives with their spouse Lives in: House/apartment Stairs:  one level home, one step to enter porch Has following equipment at home: Single point cane, Environmental consultant - 2 wheeled, Environmental consultant - 4 wheeled, shower chair, and Grab bars  OCCUPATION: retired  PLOF: Requires assistive device for independence and Leisure: walking with wife, going out to eat, movies  PATIENT GOALS: To be able to walk into wedding venue for granddaughter's wedding on September 21st, 2024.  NEXT MD VISIT: Shirline Frees on 07/01/2022  OBJECTIVE:   DIAGNOSTIC FINDINGS: Head CT on 04/27/2023: IMPRESSION: 1. Stable 6 mm acute on chronic left  subdural hematoma. No associated midline shift. 2. Stable 6 mm chronic right subdural hematoma. 3. No new intracranial abnormality. 4. No acute displaced fracture or traumatic listhesis of the cervical spine. 5. Persistent right apical subpleural 1.5 cm ground-glass nodular-like airspace opacity (increased in size from 2018). Adenocarcinoma is not excluded. Additional imaging evaluation or consultation with Pulmonology or Thoracic Surgery recommended.  PATIENT SURVEYS:  Eval:  LEFS  20 / 80 = 25.0 %  COGNITION: Overall cognitive status:  wife reports occasionally pt has word finding problems and some memory impairments      SENSATION: Wife reports that sometimes left hand has "spasms"  MUSCLE LENGTH: Hamstrings: Tightness bilaterally  POSTURE: rounded shoulders, forward head, and flexed trunk    LOWER EXTREMITY ROM:  WFL  LOWER EXTREMITY MMT:  Eval: Right LE strength grossly 4-/5 Left LE strength grossly 4 to 4+/5 throughout  FUNCTIONAL TESTS:  Eval:   5 times sit to stand: 14.96 sec with UE use required Timed up and go (TUG): 13.71 sec without UE device with unsteadiness noted especially with 180 degree turn BERG:  27/56  01/24/2023: 3 minute walk:  378 ft with SPC (pt with shuffling gait pattern)  02/26/2023: Timed up and go (TUG): 11.69 sec 5 times sit to stand: 13.28 sec with UE use required 3 minute walk: 470 ft without assistive device  GAIT: Distance walked: >200 ft Assistive device utilized: Single point cane Level of assistance: SBA Comments: Pt with unsteady gait noted and requires SBA   TODAY'S TREATMENT:       03/01/23:   Pt arrives for aquatic physical therapy. Treatment took place in 3.5-5.5 feet of water. Water temperature was 90 degrees F. Pt entered the pool via stairs step to step with heavy use of the rails. Pt requires buoyancy of water for support and to offload joints with strengthening exercises.   Seated water bench with 75% submersion Pt performed seated LE AROM exercises 20x in all planes,concurrent discussion of status followed by verbal education on water principles and how we would be using the. Pt verbally understood. 50% water depth due to pts height water walking 6x iin each direction with SBA and CGA at times due to pt instability especially backwards walking. Static standing holding onto yellow noodle: marching 20x/ SBA. Standing with arms forward & back 10x holding green water bells. VC to hold balance steady.  Hip Abd Bil 15x holding pool wall 10x each, heel raises 15x2.Marland Kitchen Step ups on first step 5x Bil. Attempted single leg stance hold both hand held weights and the large noodle; pt could barely liftt his RT foot and could lift Lt about 1 sec,                                                                                                                 DATE:  02/26/2023 Nustep level 5 x6 min with PT present to discuss status Times up and Go, 5 times sit to stand 3 minute walk for 470 ft without assistive device Seated  with 5#:  LAQ and marching.  BLE 2x10 each Seated hip abduction clamshell with green tband 2x10 Seated hamstring curl with green tband 2x10 bilat Seated (unilateral UE) reaching forward to touch cone placed on stool 2x10 bilat Standing marching at barre 2x10 bilat Leg Press (seat at 9) 110# 2x10 Standing rows with 15# cable pulley x12   02/22/23: Pt arrives for aquatic physical therapy. Treatment took place in 3.5-5.5 feet of water. Water temperature was. Pt entered the pool via stairs step to step with heavy use of the rails. Pt requires buoyancy of water for support and to offload joints with strengthening exercises.   Seated water bench with 75% submersion Pt performed seated LE AROM exercises 20x in all planes,concurrent discussion of status followed by verbal education on water principles and how we would be using the. Pt verbally understood. 50% water depth due to pts height water walking 4x iin each direction with SBA and CGA at times due to pt instability. Static standing holding onto yellow noodle: marching 10x/ SBA. Standing with arms forward & back 10x VC to hold balance steady. Added perturbations from all direction 30 sec using kickboard. Hip Abd Bil 10x holding pool wall 10x each, heel raises 10x2.         PATIENT EDUCATION:  Education details: Issued HEP Person educated: Patient and Spouse Education method: Explanation, Facilities manager, and Handouts Education  comprehension: verbalized understanding  HOME EXERCISE PROGRAM: Access Code: [email or web address] URL: [email or web address] Date: 01/10/2023 Prepared by: Clydie Braun Menke  Exercises - Sit to Stand  - 1 x daily - 7 x weekly - 2 sets - 10 reps - Standing Hip Abduction with Counter Support  - 1 x daily - 7 x weekly - 2 sets - 10 reps - Standing Hip Extension with Counter Support  - 1 x daily - 7 x weekly - 2 sets - 10 reps - Standing March with Counter Support  - 1 x daily - 7 x weekly - 2 sets - 10 reps - Heel Raises with Counter Support  - 1 x daily - 7 x weekly - 2 sets - 10 reps  ASSESSMENT:  CLINICAL IMPRESSION: Pt tolerated his initial aquatic PT session very well, wife reported fatigue but also states he has been walking much better since. Pt really increased his over all work load in the pool today. He has great difficulty with single leg balance even when holding a buoyant object.LE very very fatigued at end of todays session.  OBJECTIVE IMPAIRMENTS: decreased balance, difficulty walking, decreased strength, impaired flexibility, postural dysfunction, and pain.   ACTIVITY LIMITATIONS: carrying, lifting, bending, standing, squatting, and stairs  PARTICIPATION LIMITATIONS: cleaning and community activity  PERSONAL FACTORS: Past/current experiences, Time since onset of injury/illness/exacerbation, and 3+ comorbidities: subdural hematoma, HTN, OA  are also affecting patient's functional outcome.   REHAB POTENTIAL: Good  CLINICAL DECISION MAKING: Evolving/moderate complexity  EVALUATION COMPLEXITY: Moderate   GOALS: Goals reviewed with patient? Yes  SHORT TERM GOALS: Target date: 02/01/2023 Pt will be independent with initial HEP. Baseline: Goal status: MET  2.  Pt will improve ambulation safety with least restrictive assistive device to allow him to safely ambulate distances required for granddaughter's wedding on 02/02/2023. Baseline:  Goal status: Met   LONG TERM  GOALS: Target date: 03/08/2023  Pt will be independent with advanced HEP to allow for self progression post discharge. Baseline:  Goal status: Ongoing  2.  Patient will increase Lower Extremity Functional Scale to at least 40% to demonstrate improvement  in functional tasks. Baseline: 25% Goal status: Ongoing  3.  Patient will increase BERG to at least 40/56 to demonstrate decreased risk of falling. Baseline: 27/56 Goal status: INITIAL  4.  Pt will report being able to ambulate at least 20 minutes with LRAD without increased pain and no loss of balance to allow for community ambulation. Baseline:  Goal status: Ongoing  5.  Pt will increase LE strength to at least 4+/5 to allow him to perform sit to stand without UE use and navigate a curb with LRAD safely. Baseline:  Goal status: INITIAL   PLAN:  PT FREQUENCY: 2x/week  PT DURATION: 8 weeks  PLANNED INTERVENTIONS: Therapeutic exercises, Therapeutic activity, Neuromuscular re-education, Balance training, Gait training, Patient/Family education, Self Care, Joint mobilization, Joint manipulation, Stair training, Vestibular training, Canalith repositioning, Aquatic Therapy, Dry Needling, Electrical stimulation, Spinal manipulation, Spinal mobilization, Cryotherapy, Moist heat, Taping, Traction, Ultrasound, Ionotophoresis 4mg /ml Dexamethasone, Manual therapy, and Re-evaluation  PLAN FOR NEXT SESSION: Strengthening, balance, flexibility, focus on proximal hip and core strength, aquatics   Ane Payment, PTA 03/01/23 4:49 PM   [facility] [address], [address] [place], [place] [address] Phone # [phone] Fax [phone]

## 2023-03-04 NOTE — Telephone Encounter (Signed)
Contacted Libby and she was busy. I left a message with front desk requesting that she return my call.

## 2023-03-05 ENCOUNTER — Ambulatory Visit: Payer: Medicare HMO | Admitting: Rehabilitative and Restorative Service Providers"

## 2023-03-05 ENCOUNTER — Encounter: Payer: Self-pay | Admitting: Rehabilitative and Restorative Service Providers"

## 2023-03-05 DIAGNOSIS — R293 Abnormal posture: Secondary | ICD-10-CM | POA: Diagnosis not present

## 2023-03-05 DIAGNOSIS — R2681 Unsteadiness on feet: Secondary | ICD-10-CM

## 2023-03-05 DIAGNOSIS — R2689 Other abnormalities of gait and mobility: Secondary | ICD-10-CM

## 2023-03-05 DIAGNOSIS — R262 Difficulty in walking, not elsewhere classified: Secondary | ICD-10-CM | POA: Diagnosis not present

## 2023-03-05 DIAGNOSIS — M6281 Muscle weakness (generalized): Secondary | ICD-10-CM | POA: Diagnosis not present

## 2023-03-05 DIAGNOSIS — I10 Essential (primary) hypertension: Secondary | ICD-10-CM | POA: Diagnosis not present

## 2023-03-05 DIAGNOSIS — S065XAA Traumatic subdural hemorrhage with loss of consciousness status unknown, initial encounter: Secondary | ICD-10-CM | POA: Diagnosis not present

## 2023-03-05 NOTE — Therapy (Signed)
OUTPATIENT PHYSICAL THERAPY TREATMENT NOTE AND REASSESSMENT NOTE   Patient Name: Brandon [name] MRN: [id number] DOB:[birth year], 77 y.o., male Today's Date: 03/05/2023  Progress Note Reporting Period 01/10/2023 to 03/05/2023  See note below for Objective Data and Assessment of Progress/Goals.       END OF SESSION:  PT End of Session - 03/05/23 1235     Visit Number 8    Date for PT Re-Evaluation 05/03/23    Authorization Type [employer]    Authorization Time Period 01/10/2023 - 03/08/2023    Authorization - Visit Number 8    Authorization - Number of Visits 16    Progress Note Due on Visit 18    PT Start Time 1230    PT Stop Time 1310    PT Time Calculation (min) 40 min    Activity Tolerance Patient tolerated treatment well    Behavior During Therapy WFL for tasks assessed/performed               Past Medical History:  Diagnosis Date   Allergy    Basal cell carcinoma    GERD (gastroesophageal reflux disease)    History of kidney cancer    benign; told not cancerous   Hypertension    Past Surgical History:  Procedure Laterality Date   CATARACT EXTRACTION Bilateral    CRANIOTOMY Bilateral 12/25/2021   Procedure: CRANIOTOMY HEMATOMA EVACUATION SUBDURAL;  Surgeon: Julio Sicks, MD;  Location: [facility] OR;  Service: Neurosurgery;  Laterality: Bilateral;   HERNIA REPAIR     x2   KIDNEY SURGERY Right    Had right kidney removed.    TONSILLECTOMY     VASECTOMY     Patient Active Problem List   Diagnosis Date Noted   Cognitive and neurobehavioral dysfunction following brain injury (HCC) 02/07/2022   Gait difficulty 02/07/2022   Traumatic brain injury with loss of consciousness (HCC) 12/30/2021   SDH (subdural hematoma) (HCC) 12/24/2021   Hyperlipidemia 02/13/2018   BPH (benign prostatic hyperplasia) 01/31/2017   Heart murmur 01/20/2016   HTN (hypertension) 03/09/2012    PCP: Shirline Frees, NP  REFERRING PROVIDER: Shirline Frees, NP  REFERRING DIAG:  R26.81 (ICD-10-CM) - Gait instability  THERAPY DIAG:  Balance problem - Plan: PT plan of care cert/re-cert  Muscle weakness (generalized) - Plan: PT plan of care cert/re-cert  Unsteadiness on feet - Plan: PT plan of care cert/re-cert  Difficulty in walking, not elsewhere classified - Plan: PT plan of care cert/re-cert  Abnormal posture - Plan: PT plan of care cert/re-cert  Rationale for Evaluation and Treatment: Rehabilitation  ONSET DATE: Pt and wife report that frequent falls have been occurring for the past year  SUBJECTIVE:   SUBJECTIVE STATEMENT:  Pt reports that the pool is helping, that he feels fatigue for the new few hours.  Wife states that patient nearly fell later in the day after the appointment secondary to being fatigued.  PERTINENT HISTORY: Subdural Hematoma, HTN, GERD, Basal Cell Carcinoma, Hx of benign kidney mass, OA  PAIN:  Are you having pain? Yes: NPRS scale: 3-4/10 Pain location: bilateral knees (right greater than left) Pain description: sore Aggravating factors: unknown Relieving factors: Voltaren gel  PRECAUTIONS: Fall  RED FLAGS: None   WEIGHT BEARING RESTRICTIONS: No  FALLS:  Has patient fallen in last 6 months? Yes. Number of falls 3  LIVING ENVIRONMENT: Lives with: lives with their spouse Lives in: House/apartment Stairs:  one level home, one step to enter porch Has following equipment at home: Single  point cane, Walker - 2 wheeled, Environmental consultant - 4 wheeled, shower chair, and Grab bars  OCCUPATION: retired  PLOF: Requires assistive device for independence and Leisure: walking with wife, going out to eat, movies  PATIENT GOALS: To be able to walk into wedding venue for granddaughter's wedding on September 21st, 2024.  NEXT MD VISIT: Shirline Frees on 07/01/2022  OBJECTIVE:   DIAGNOSTIC FINDINGS: Head CT on 04/27/2023: IMPRESSION: 1. Stable 6 mm acute on chronic left subdural hematoma. No associated midline shift. 2. Stable 6 mm  chronic right subdural hematoma. 3. No new intracranial abnormality. 4. No acute displaced fracture or traumatic listhesis of the cervical spine. 5. Persistent right apical subpleural 1.5 cm ground-glass nodular-like airspace opacity (increased in size from 2018). Adenocarcinoma is not excluded. Additional imaging evaluation or consultation with Pulmonology or Thoracic Surgery recommended.  PATIENT SURVEYS:  Eval:  LEFS 20 / 80 = 25.0 % 03/05/2023:  Lower Extremity Functional Score: 35 / 80 = 43.8 %  COGNITION: Overall cognitive status:  wife reports occasionally pt has word finding problems and some memory impairments      SENSATION: Wife reports that sometimes left hand has "spasms"  MUSCLE LENGTH: Hamstrings: Tightness bilaterally  POSTURE: rounded shoulders, forward head, and flexed trunk    LOWER EXTREMITY ROM:  WFL  LOWER EXTREMITY MMT:  Eval: Right LE strength grossly 4-/5 Left LE strength grossly 4 to 4+/5 throughout  FUNCTIONAL TESTS:  Eval:   5 times sit to stand: 14.96 sec with UE use required Timed up and go (TUG): 13.71 sec without UE device with unsteadiness noted especially with 180 degree turn BERG:  27/56  01/24/2023: 3 minute walk:  378 ft with SPC (pt with shuffling gait pattern)  02/26/2023: Timed up and go (TUG): 11.69 sec 5 times sit to stand: 13.28 sec with UE use required 3 minute walk: 470 ft without assistive device  03/05/2023: BERG: 39/56  GAIT: Distance walked: >200 ft Assistive device utilized: Single point cane Level of assistance: SBA Comments: Pt with unsteady gait noted and requires SBA   TODAY'S TREATMENT:                                                                                                               DATE:  03/05/2023: Nustep level 5 x6 min with PT present to discuss status LEFS with patient and wife BERG 39/56 Alt toe tap to 6" step 2x10  bilat Seated with 5#:  LAQ and marching.  BLE 2x10 each Seated  hip abduction clamshell with green tband 2x10 Seated hamstring curl with green tband x10 bilat    03/01/23:   Pt arrives for aquatic physical therapy. Treatment took place in 3.5-5.5 feet of water. Water temperature was 90 degrees F. Pt entered the pool via stairs step to step with heavy use of the rails. Pt requires buoyancy of water for support and to offload joints with strengthening exercises.   Seated water bench with 75% submersion Pt performed seated LE AROM exercises 20x in all planes,concurrent discussion of  status followed by verbal education on water principles and how we would be using the. Pt verbally understood. 50% water depth due to pts height water walking 6x iin each direction with SBA and CGA at times due to pt instability especially backwards walking. Static standing holding onto yellow noodle: marching 20x/ SBA. Standing with arms forward & back 10x holding green water bells. VC to hold balance steady. Hip Abd Bil 15x holding pool wall 10x each, heel raises 15x2.Marland Kitchen Step ups on first step 5x Bil. Attempted single leg stance hold both hand held weights and the large noodle; pt could barely liftt his RT foot and could lift Lt about 1 sec,      02/26/2023 Nustep level 5 x6 min with PT present to discuss status Times up and Go, 5 times sit to stand 3 minute walk for 470 ft without assistive device Seated with 5#:  LAQ and marching.  BLE 2x10 each Seated hip abduction clamshell with green tband 2x10 Seated hamstring curl with green tband 2x10 bilat Seated (unilateral UE) reaching forward to touch cone placed on stool 2x10 bilat Standing marching at barre 2x10 bilat Leg Press (seat at 9) 110# 2x10 Standing rows with 15# cable pulley x12    PATIENT EDUCATION:  Education details: Issued HEP Person educated: Patient and Spouse Education method: Explanation, Facilities manager, and Handouts Education comprehension: verbalized understanding  HOME EXERCISE PROGRAM: Access Code:  [email or web address] URL: [email or web address] Date: 01/10/2023 Prepared by: Clydie Braun Vaishali Baise  Exercises - Sit to Stand  - 1 x daily - 7 x weekly - 2 sets - 10 reps - Standing Hip Abduction with Counter Support  - 1 x daily - 7 x weekly - 2 sets - 10 reps - Standing Hip Extension with Counter Support  - 1 x daily - 7 x weekly - 2 sets - 10 reps - Standing March with Counter Support  - 1 x daily - 7 x weekly - 2 sets - 10 reps - Heel Raises with Counter Support  - 1 x daily - 7 x weekly - 2 sets - 10 reps  ASSESSMENT:  CLINICAL IMPRESSION:  Mr Cuzzort presents to skilled PT reporting that he does seem to be feeling better and getting stronger with PT, stating "today seems to be a good day."  Last land session, reassessed TUG and 5 times sit to stand with patient making progress.  With BERG, patient with improved score noted today.  Patient has met or nearly met some goals, so updated those for the next certification period to encourage further balance and functional strength.  Patient's wife present during part of visit and states that patient sometimes has good days and is still getting fatigued after PT sessions.  States that after last aquatics PT, patient was so fatigued that he nearly fell during a transfer at home.  Patient has made great progress, but has not yet plateaued and continues to remain a falls risk.  Patient would benefit from continued skilled PT of 2x/week for 8 additional weeks to progress towards goal related activities.  OBJECTIVE IMPAIRMENTS: decreased balance, difficulty walking, decreased strength, impaired flexibility, postural dysfunction, and pain.   ACTIVITY LIMITATIONS: carrying, lifting, bending, standing, squatting, and stairs  PARTICIPATION LIMITATIONS: cleaning and community activity  PERSONAL FACTORS: Past/current experiences, Time since onset of injury/illness/exacerbation, and 3+ comorbidities: subdural hematoma, HTN, OA  are also affecting patient's  functional outcome.   REHAB POTENTIAL: Good  CLINICAL DECISION MAKING: Evolving/moderate complexity  EVALUATION COMPLEXITY: Moderate   GOALS: Goals  reviewed with patient? Yes  SHORT TERM GOALS: Target date: 02/01/2023 Pt will be independent with initial HEP. Baseline: Goal status: MET  2.  Pt will improve ambulation safety with least restrictive assistive device to allow him to safely ambulate distances required for granddaughter's wedding on 02/02/2023. Baseline:  Goal status: Met   LONG TERM GOALS: Target date: 05/03/2023 (Goals updated on 03/05/2023)  Pt will be independent with advanced HEP to allow for self progression post discharge. Baseline:  Goal status: Ongoing  2.  Patient will increase Lower Extremity Functional Scale to at least 50% to demonstrate improvement in functional tasks. Baseline: 25% Goal status: Ongoing (see above)  3.  Patient will increase BERG to at least 42/56 to demonstrate decreased risk of falling. Baseline: 27/56 Goal status: Ongoing (see above)  4.  Pt will report being able to ambulate at least 20 minutes with LRAD without increased pain and no loss of balance to allow for community ambulation. Baseline:  Goal status: Ongoing  5.  Pt will increase LE strength to at least 4+/5 to allow him to perform sit to stand without UE use and navigate a curb with LRAD safely. Baseline:  Goal status: Ongoing   PLAN:  PT FREQUENCY: 2x/week  PT DURATION: 8 weeks  PLANNED INTERVENTIONS: Therapeutic exercises, Therapeutic activity, Neuromuscular re-education, Balance training, Gait training, Patient/Family education, Self Care, Joint mobilization, Joint manipulation, Stair training, Vestibular training, Canalith repositioning, Aquatic Therapy, Dry Needling, Electrical stimulation, Spinal manipulation, Spinal mobilization, Cryotherapy, Moist heat, Taping, Traction, Ultrasound, Ionotophoresis 4mg /ml Dexamethasone, Manual therapy, and  Re-evaluation  PLAN FOR NEXT SESSION: Strengthening, balance, flexibility, focus on proximal hip and core strength, aquatics   Reather Laurence, PT, DPT 03/05/23, 1:24 PM  [facility] [address] [place], [place] [address] Phone # [phone] Fax [phone]

## 2023-03-07 ENCOUNTER — Encounter: Payer: Medicare HMO | Admitting: Rehabilitative and Restorative Service Providers"

## 2023-03-08 ENCOUNTER — Encounter (HOSPITAL_BASED_OUTPATIENT_CLINIC_OR_DEPARTMENT_OTHER): Payer: Self-pay | Admitting: Physical Therapy

## 2023-03-08 ENCOUNTER — Ambulatory Visit (HOSPITAL_BASED_OUTPATIENT_CLINIC_OR_DEPARTMENT_OTHER): Payer: Medicare HMO | Attending: Adult Health | Admitting: Physical Therapy

## 2023-03-08 DIAGNOSIS — R262 Difficulty in walking, not elsewhere classified: Secondary | ICD-10-CM

## 2023-03-08 DIAGNOSIS — R2689 Other abnormalities of gait and mobility: Secondary | ICD-10-CM

## 2023-03-08 DIAGNOSIS — R293 Abnormal posture: Secondary | ICD-10-CM | POA: Diagnosis not present

## 2023-03-08 DIAGNOSIS — M6281 Muscle weakness (generalized): Secondary | ICD-10-CM | POA: Insufficient documentation

## 2023-03-08 DIAGNOSIS — R2681 Unsteadiness on feet: Secondary | ICD-10-CM | POA: Diagnosis not present

## 2023-03-08 NOTE — Therapy (Signed)
OUTPATIENT PHYSICAL THERAPY TREATMENT NOTE   Patient Name: Brandon [name] MRN: [id number] DOB:1945/12/03, 77 y.o., male Today's Date: 03/08/2023  END OF SESSION:  PT End of Session - 03/08/23 1430     Visit Number 9    Date for PT Re-Evaluation 05/03/23    Authorization Type [employer]    Authorization Time Period 01/10/2023 - 03/08/2023    Authorization - Visit Number 9    Authorization - Number of Visits 16    Progress Note Due on Visit 18    PT Start Time 1425    PT Stop Time 1508    PT Time Calculation (min) 43 min    Behavior During Therapy WFL for tasks assessed/performed               Past Medical History:  Diagnosis Date   Allergy    Basal cell carcinoma    GERD (gastroesophageal reflux disease)    History of kidney cancer    benign; told not cancerous   Hypertension    Past Surgical History:  Procedure Laterality Date   CATARACT EXTRACTION Bilateral    CRANIOTOMY Bilateral 12/25/2021   Procedure: CRANIOTOMY HEMATOMA EVACUATION SUBDURAL;  Surgeon: Julio Sicks, MD;  Location: [facility] OR;  Service: Neurosurgery;  Laterality: Bilateral;   HERNIA REPAIR     x2   KIDNEY SURGERY Right    Had right kidney removed.    TONSILLECTOMY     VASECTOMY     Patient Active Problem List   Diagnosis Date Noted   Cognitive and neurobehavioral dysfunction following brain injury (HCC) 02/07/2022   Gait difficulty 02/07/2022   Traumatic brain injury with loss of consciousness (HCC) 12/30/2021   SDH (subdural hematoma) (HCC) 12/24/2021   Hyperlipidemia 02/13/2018   BPH (benign prostatic hyperplasia) 01/31/2017   Heart murmur 01/20/2016   HTN (hypertension) 03/09/2012    PCP: Shirline Frees, NP  REFERRING PROVIDER: Shirline Frees, NP  REFERRING DIAG: R26.81 (ICD-10-CM) - Gait instability  THERAPY DIAG:  Balance problem  Muscle weakness (generalized)  Unsteadiness on feet  Difficulty in walking, not elsewhere classified  Rationale for Evaluation and  Treatment: Rehabilitation  ONSET DATE: Pt and wife report that frequent falls have been occurring for the past year  SUBJECTIVE:   SUBJECTIVE STATEMENT:  Pt reports no new changes since last land visit. Pt reports that he does not currently have membership to pool.   PERTINENT HISTORY: Subdural Hematoma, HTN, GERD, Basal Cell Carcinoma, Hx of benign kidney mass, OA  PAIN:  Are you having pain? Yes: NPRS scale: 3/10 Pain location: bilateral knees (right greater than left) Pain description: sore Aggravating factors: unknown Relieving factors: Voltaren gel  PRECAUTIONS: Fall  RED FLAGS: None   WEIGHT BEARING RESTRICTIONS: No  FALLS:  Has patient fallen in last 6 months? Yes. Number of falls 3  LIVING ENVIRONMENT: Lives with: lives with their spouse Lives in: House/apartment Stairs:  one level home, one step to enter porch Has following equipment at home: Single point cane, Environmental consultant - 2 wheeled, Environmental consultant - 4 wheeled, shower chair, and Grab bars  OCCUPATION: retired  PLOF: Requires assistive device for independence and Leisure: walking with wife, going out to eat, movies  PATIENT GOALS: To be able to walk into wedding venue for granddaughter's wedding on September 21st, 2024.  NEXT MD VISIT: Shirline Frees on 07/01/2022  OBJECTIVE:   DIAGNOSTIC FINDINGS: Head CT on 04/27/2023: IMPRESSION: 1. Stable 6 mm acute on chronic left subdural hematoma. No associated midline shift. 2.  Stable 6 mm chronic right subdural hematoma. 3. No new intracranial abnormality. 4. No acute displaced fracture or traumatic listhesis of the cervical spine. 5. Persistent right apical subpleural 1.5 cm ground-glass nodular-like airspace opacity (increased in size from 2018). Adenocarcinoma is not excluded. Additional imaging evaluation or consultation with Pulmonology or Thoracic Surgery recommended.  PATIENT SURVEYS:  Eval:  LEFS 20 / 80 = 25.0 % 03/05/2023:  Lower Extremity Functional Score:  35 / 80 = 43.8 %  COGNITION: Overall cognitive status:  wife reports occasionally pt has word finding problems and some memory impairments      SENSATION: Wife reports that sometimes left hand has "spasms"  MUSCLE LENGTH: Hamstrings: Tightness bilaterally  POSTURE: rounded shoulders, forward head, and flexed trunk    LOWER EXTREMITY ROM:  WFL  LOWER EXTREMITY MMT:  Eval: Right LE strength grossly 4-/5 Left LE strength grossly 4 to 4+/5 throughout  FUNCTIONAL TESTS:  Eval:   5 times sit to stand: 14.96 sec with UE use required Timed up and go (TUG): 13.71 sec without UE device with unsteadiness noted especially with 180 degree turn BERG:  27/56  01/24/2023: 3 minute walk:  378 ft with SPC (pt with shuffling gait pattern)  02/26/2023: Timed up and go (TUG): 11.69 sec 5 times sit to stand: 13.28 sec with UE use required 3 minute walk: 470 ft without assistive device  03/05/2023: BERG: 39/56  GAIT: Distance walked: >200 ft Assistive device utilized: Single point cane Level of assistance: SBA Comments: Pt with unsteady gait noted and requires SBA   TODAY'S TREATMENT:                                                                                                              DATE:  03/08/2023: Pt seen for aquatic therapy today.  Treatment took place in water 3.5-4.75 ft in depth at the [facility] pool. Temp of water was 91.  Pt entered/exited the pool via stairs independently with bilat rail.  In 42ft 8" with UE on yellow noodle: forward/ backward walking, high knee marching forward/ backwards;  * UE on wall:  high knee marching;  hip openers (hip abdct/knee flexion) alternating LEs * unsupported side stepping R/L -> UE on noodle x 3 laps * UE on wall:  hip abdct/ addct x 15 each LE; heel / toe raises x 10; leg swings into hip flex/ ext x 10 (tight hip flexors); 2 fingers on wall- SLS x 10s each LE x 2  * UE on noodle with CGA of therapist:  tandem gait  forward/ backward (challenge)  * UE on yellow hand floats:  forward step and return to neutral x 5 each LE; box step R/L for work on weight shift and direction changes * toe taps with first step with intermittent UE support x 5 each LE (alternating LEs- challenge!)   DATE:  03/05/2023: Nustep level 5 x6 min with PT present to discuss status LEFS with patient and wife BERG 39/56 Alt toe tap to 6" step 2x10  bilat Seated  with 5#:  LAQ and marching.  BLE 2x10 each Seated hip abduction clamshell with green tband 2x10 Seated hamstring curl with green tband x10 bilat    03/01/23:   Pt arrives for aquatic physical therapy. Treatment took place in 3.5-5.5 feet of water. Water temperature was 90 degrees F. Pt entered the pool via stairs step to step with heavy use of the rails. Pt requires buoyancy of water for support and to offload joints with strengthening exercises.   Seated water bench with 75% submersion Pt performed seated LE AROM exercises 20x in all planes,concurrent discussion of status followed by verbal education on water principles and how we would be using the. Pt verbally understood. 50% water depth due to pts height water walking 6x iin each direction with SBA and CGA at times due to pt instability especially backwards walking. Static standing holding onto yellow noodle: marching 20x/ SBA. Standing with arms forward & back 10x holding green water bells. VC to hold balance steady. Hip Abd Bil 15x holding pool wall 10x each, heel raises 15x2.Marland Kitchen Step ups on first step 5x Bil. Attempted single leg stance hold both hand held weights and the large noodle; pt could barely liftt his RT foot and could lift Lt about 1 sec,      02/26/2023 Nustep level 5 x6 min with PT present to discuss status Times up and Go, 5 times sit to stand 3 minute walk for 470 ft without assistive device Seated with 5#:  LAQ and marching.  BLE 2x10 each Seated hip abduction clamshell with green tband 2x10 Seated  hamstring curl with green tband 2x10 bilat Seated (unilateral UE) reaching forward to touch cone placed on stool 2x10 bilat Standing marching at barre 2x10 bilat Leg Press (seat at 9) 110# 2x10 Standing rows with 15# cable pulley x12    PATIENT EDUCATION:  Education details: Issued HEP Person educated: Patient and Spouse Education method: Explanation, Facilities manager, and Handouts Education comprehension: verbalized understanding  HOME EXERCISE PROGRAM: Access Code: [email or web address] URL: [email or web address] Date: 01/10/2023 Prepared by: Clydie Braun Menke  Exercises - Sit to Stand  - 1 x daily - 7 x weekly - 2 sets - 10 reps - Standing Hip Abduction with Counter Support  - 1 x daily - 7 x weekly - 2 sets - 10 reps - Standing Hip Extension with Counter Support  - 1 x daily - 7 x weekly - 2 sets - 10 reps - Standing March with Counter Support  - 1 x daily - 7 x weekly - 2 sets - 10 reps - Heel Raises with Counter Support  - 1 x daily - 7 x weekly - 2 sets - 10 reps  ASSESSMENT:  CLINICAL IMPRESSION:  Decreased balance in Rt stance of gait. He requires cues for increased step height of Rt leg during swing phase. He demonstrated difficulty with narrow BOS exercise (tandem), requiring CGA from therapist.  Pt demonstrated difficulty lifting LLE when in Rt stance without UE support.  Pt reported reduction of knee pain to 2/10 at end of session.  Pt may benefit from use of rollator to/from aquatic therapy sessions for safety due to increased fatigue after sessions.  Patient would benefit from continued skilled PT  to progress towards goal related activities.  OBJECTIVE IMPAIRMENTS: decreased balance, difficulty walking, decreased strength, impaired flexibility, postural dysfunction, and pain.   ACTIVITY LIMITATIONS: carrying, lifting, bending, standing, squatting, and stairs  PARTICIPATION LIMITATIONS: cleaning and community activity  PERSONAL FACTORS: Past/current experiences, Time  since onset  of injury/illness/exacerbation, and 3+ comorbidities: subdural hematoma, HTN, OA  are also affecting patient's functional outcome.   REHAB POTENTIAL: Good  CLINICAL DECISION MAKING: Evolving/moderate complexity  EVALUATION COMPLEXITY: Moderate   GOALS: Goals reviewed with patient? Yes  SHORT TERM GOALS: Target date: 02/01/2023 Pt will be independent with initial HEP. Baseline: Goal status: MET  2.  Pt will improve ambulation safety with least restrictive assistive device to allow him to safely ambulate distances required for granddaughter's wedding on 02/02/2023. Baseline:  Goal status: Met   LONG TERM GOALS: Target date: 05/03/2023 (Goals updated on 03/05/2023)  Pt will be independent with advanced HEP to allow for self progression post discharge. Baseline:  Goal status: Ongoing  2.  Patient will increase Lower Extremity Functional Scale to at least 50% to demonstrate improvement in functional tasks. Baseline: 25% Goal status: Ongoing (see above)  3.  Patient will increase BERG to at least 42/56 to demonstrate decreased risk of falling. Baseline: 27/56 Goal status: Ongoing (see above)  4.  Pt will report being able to ambulate at least 20 minutes with LRAD without increased pain and no loss of balance to allow for community ambulation. Baseline:  Goal status: Ongoing  5.  Pt will increase LE strength to at least 4+/5 to allow him to perform sit to stand without UE use and navigate a curb with LRAD safely. Baseline:  Goal status: Ongoing   PLAN:  PT FREQUENCY: 2x/week  PT DURATION: 8 weeks  PLANNED INTERVENTIONS: Therapeutic exercises, Therapeutic activity, Neuromuscular re-education, Balance training, Gait training, Patient/Family education, Self Care, Joint mobilization, Joint manipulation, Stair training, Vestibular training, Canalith repositioning, Aquatic Therapy, Dry Needling, Electrical stimulation, Spinal manipulation, Spinal mobilization,  Cryotherapy, Moist heat, Taping, Traction, Ultrasound, Ionotophoresis 4mg /ml Dexamethasone, Manual therapy, and Re-evaluation  PLAN FOR NEXT SESSION: Strengthening, balance, flexibility, focus on proximal hip and core strength, aquatics  Mayer Camel, PTA 03/08/23 3:22 PM [facility] [address] [place], [place], [address] Phone: [phone]   Fax:  [phone]

## 2023-03-12 ENCOUNTER — Ambulatory Visit: Payer: Medicare HMO | Admitting: Rehabilitative and Restorative Service Providers"

## 2023-03-12 ENCOUNTER — Encounter: Payer: Self-pay | Admitting: Rehabilitative and Restorative Service Providers"

## 2023-03-12 DIAGNOSIS — R262 Difficulty in walking, not elsewhere classified: Secondary | ICD-10-CM | POA: Diagnosis not present

## 2023-03-12 DIAGNOSIS — R2681 Unsteadiness on feet: Secondary | ICD-10-CM | POA: Diagnosis not present

## 2023-03-12 DIAGNOSIS — R293 Abnormal posture: Secondary | ICD-10-CM | POA: Diagnosis not present

## 2023-03-12 DIAGNOSIS — I10 Essential (primary) hypertension: Secondary | ICD-10-CM | POA: Diagnosis not present

## 2023-03-12 DIAGNOSIS — R2689 Other abnormalities of gait and mobility: Secondary | ICD-10-CM

## 2023-03-12 DIAGNOSIS — S065XAA Traumatic subdural hemorrhage with loss of consciousness status unknown, initial encounter: Secondary | ICD-10-CM | POA: Diagnosis not present

## 2023-03-12 DIAGNOSIS — M6281 Muscle weakness (generalized): Secondary | ICD-10-CM

## 2023-03-12 NOTE — Therapy (Signed)
OUTPATIENT PHYSICAL THERAPY TREATMENT NOTE   Patient Name: Brandon [name] MRN: [id number] DOB:03/02/1946, 77 y.o., male Today's Date: 03/12/2023  END OF SESSION:  PT End of Session - 03/12/23 0930     Visit Number 10    Date for PT Re-Evaluation 05/03/23    Authorization Type [employer]    Authorization Time Period 01/10/2023 - 03/08/2023    Authorization - Visit Number 10    Authorization - Number of Visits 16    Progress Note Due on Visit 18    PT Start Time 0930    PT Stop Time 1010    PT Time Calculation (min) 40 min    Activity Tolerance Patient tolerated treatment well    Behavior During Therapy WFL for tasks assessed/performed               Past Medical History:  Diagnosis Date   Allergy    Basal cell carcinoma    GERD (gastroesophageal reflux disease)    History of kidney cancer    benign; told not cancerous   Hypertension    Past Surgical History:  Procedure Laterality Date   CATARACT EXTRACTION Bilateral    CRANIOTOMY Bilateral 12/25/2021   Procedure: CRANIOTOMY HEMATOMA EVACUATION SUBDURAL;  Surgeon: Julio Sicks, MD;  Location: [facility] OR;  Service: Neurosurgery;  Laterality: Bilateral;   HERNIA REPAIR     x2   KIDNEY SURGERY Right    Had right kidney removed.    TONSILLECTOMY     VASECTOMY     Patient Active Problem List   Diagnosis Date Noted   Cognitive and neurobehavioral dysfunction following brain injury (HCC) 02/07/2022   Gait difficulty 02/07/2022   Traumatic brain injury with loss of consciousness (HCC) 12/30/2021   SDH (subdural hematoma) (HCC) 12/24/2021   Hyperlipidemia 02/13/2018   BPH (benign prostatic hyperplasia) 01/31/2017   Heart murmur 01/20/2016   HTN (hypertension) 03/09/2012    PCP: Shirline Frees, NP  REFERRING PROVIDER: Shirline Frees, NP  REFERRING DIAG: R26.81 (ICD-10-CM) - Gait instability  THERAPY DIAG:  Balance problem  Muscle weakness (generalized)  Unsteadiness on feet  Difficulty in walking, not  elsewhere classified  Rationale for Evaluation and Treatment: Rehabilitation  ONSET DATE: Pt and wife report that frequent falls have been occurring for the past year  SUBJECTIVE:   SUBJECTIVE STATEMENT:  Pt reports a little knee pain, otherwise, no new complaints.  PERTINENT HISTORY: Subdural Hematoma, HTN, GERD, Basal Cell Carcinoma, Hx of benign kidney mass, OA  PAIN:  Are you having pain? Yes: NPRS scale: 2-3/10 Pain location: bilateral knees (right greater than left) Pain description: sore Aggravating factors: unknown Relieving factors: Voltaren gel  PRECAUTIONS: Fall  RED FLAGS: None   WEIGHT BEARING RESTRICTIONS: No  FALLS:  Has patient fallen in last 6 months? Yes. Number of falls 3  LIVING ENVIRONMENT: Lives with: lives with their spouse Lives in: House/apartment Stairs:  one level home, one step to enter porch Has following equipment at home: Single point cane, Environmental consultant - 2 wheeled, Environmental consultant - 4 wheeled, shower chair, and Grab bars  OCCUPATION: retired  PLOF: Requires assistive device for independence and Leisure: walking with wife, going out to eat, movies  PATIENT GOALS: To be able to walk into wedding venue for granddaughter's wedding on September 21st, 2024.  NEXT MD VISIT: Shirline Frees on 07/01/2022  OBJECTIVE:   DIAGNOSTIC FINDINGS: Head CT on 04/27/2023: IMPRESSION: 1. Stable 6 mm acute on chronic left subdural hematoma. No associated midline shift. 2. Stable 6  mm chronic right subdural hematoma. 3. No new intracranial abnormality. 4. No acute displaced fracture or traumatic listhesis of the cervical spine. 5. Persistent right apical subpleural 1.5 cm ground-glass nodular-like airspace opacity (increased in size from 2018). Adenocarcinoma is not excluded. Additional imaging evaluation or consultation with Pulmonology or Thoracic Surgery recommended.  PATIENT SURVEYS:  Eval:  LEFS 20 / 80 = 25.0 % 03/05/2023:  Lower Extremity Functional  Score: 35 / 80 = 43.8 %  COGNITION: Overall cognitive status:  wife reports occasionally pt has word finding problems and some memory impairments      SENSATION: Wife reports that sometimes left hand has "spasms"  MUSCLE LENGTH: Hamstrings: Tightness bilaterally  POSTURE: rounded shoulders, forward head, and flexed trunk    LOWER EXTREMITY ROM:  WFL  LOWER EXTREMITY MMT:  Eval: Right LE strength grossly 4-/5 Left LE strength grossly 4 to 4+/5 throughout  FUNCTIONAL TESTS:  Eval:   5 times sit to stand: 14.96 sec with UE use required Timed up and go (TUG): 13.71 sec without UE device with unsteadiness noted especially with 180 degree turn BERG:  27/56  01/24/2023: 3 minute walk:  378 ft with SPC (pt with shuffling gait pattern)  02/26/2023: Timed up and go (TUG): 11.69 sec 5 times sit to stand: 13.28 sec with UE use required 3 minute walk: 470 ft without assistive device  03/05/2023: BERG: 39/56  GAIT: Distance walked: >200 ft Assistive device utilized: Single point cane Level of assistance: SBA Comments: Pt with unsteady gait noted and requires SBA   TODAY'S TREATMENT:                                                                                                              DATE:  03/12/2023: Nustep level 5 x6 min with PT present to discuss status Standing with 3# on bilat ankles:  heel/toe raises, high marching, hip abduction, hip extension.  2x10 each bilat Sit to/from stand (seated on foam pad) 2x10 Seated hip abduction clamshell with blue tband 2x10 Seated hamstring curl with blue tband 2x10 bilat Alt toe tap to 6" step 2x10  bilat FWD step up on 6" step with UE support 2x10 bilat Leg Press (seat at 9) 110# x10, 120# x10 FWD step over 4 small hurdles by barre down and back x3 Lateral step over 4 small hurdles by barre down and back x3 Rocker board DF/PF x2 min   03/08/2023: Pt seen for aquatic therapy today.  Treatment took place in water 3.5-4.75  ft in depth at the [facility] pool. Temp of water was 91.  Pt entered/exited the pool via stairs independently with bilat rail.  In 30ft 8" with UE on yellow noodle: forward/ backward walking, high knee marching forward/ backwards;  * UE on wall:  high knee marching;  hip openers (hip abdct/knee flexion) alternating LEs * unsupported side stepping R/L -> UE on noodle x 3 laps * UE on wall:  hip abdct/ addct x 15 each LE; heel / toe raises x 10; leg swings into  hip flex/ ext x 10 (tight hip flexors); 2 fingers on wall- SLS x 10s each LE x 2  * UE on noodle with CGA of therapist:  tandem gait forward/ backward (challenge)  * UE on yellow hand floats:  forward step and return to neutral x 5 each LE; box step R/L for work on weight shift and direction changes * toe taps with first step with intermittent UE support x 5 each LE (alternating LEs- challenge!)    03/05/2023: Nustep level 5 x6 min with PT present to discuss status LEFS with patient and wife BERG 39/56 Alt toe tap to 6" step 2x10  bilat Seated with 5#:  LAQ and marching.  BLE 2x10 each Seated hip abduction clamshell with green tband 2x10 Seated hamstring curl with green tband x10 bilat       PATIENT EDUCATION:  Education details: Issued HEP Person educated: Patient and Spouse Education method: Explanation, Demonstration, and Handouts Education comprehension: verbalized understanding  HOME EXERCISE PROGRAM: Access Code: [email or web address] URL: [email or web address] Date: 01/10/2023 Prepared by: Clydie Braun Rucha Wissinger  Exercises - Sit to Stand  - 1 x daily - 7 x weekly - 2 sets - 10 reps - Standing Hip Abduction with Counter Support  - 1 x daily - 7 x weekly - 2 sets - 10 reps - Standing Hip Extension with Counter Support  - 1 x daily - 7 x weekly - 2 sets - 10 reps - Standing March with Counter Support  - 1 x daily - 7 x weekly - 2 sets - 10 reps - Heel Raises with Counter Support  - 1 x daily - 7 x weekly - 2  sets - 10 reps  ASSESSMENT:  CLINICAL IMPRESSION:  Gloris Manchester presents to skilled PT with his wife reporting that she can tell that patient has better walking pattern.  Patient continues to progress with standing tolerance during session with increased weights in standing exercises.  Patient able to perform step over small hurdles with UE support for improved balance.  Patient continues to progress with improved standing balance and no loss of balance noted during PT session.  OBJECTIVE IMPAIRMENTS: decreased balance, difficulty walking, decreased strength, impaired flexibility, postural dysfunction, and pain.   ACTIVITY LIMITATIONS: carrying, lifting, bending, standing, squatting, and stairs  PARTICIPATION LIMITATIONS: cleaning and community activity  PERSONAL FACTORS: Past/current experiences, Time since onset of injury/illness/exacerbation, and 3+ comorbidities: subdural hematoma, HTN, OA  are also affecting patient's functional outcome.   REHAB POTENTIAL: Good  CLINICAL DECISION MAKING: Evolving/moderate complexity  EVALUATION COMPLEXITY: Moderate   GOALS: Goals reviewed with patient? Yes  SHORT TERM GOALS: Target date: 02/01/2023 Pt will be independent with initial HEP. Baseline: Goal status: MET  2.  Pt will improve ambulation safety with least restrictive assistive device to allow him to safely ambulate distances required for granddaughter's wedding on 02/02/2023. Baseline:  Goal status: Met   LONG TERM GOALS: Target date: 05/03/2023 (Goals updated on 03/05/2023)  Pt will be independent with advanced HEP to allow for self progression post discharge. Baseline:  Goal status: Ongoing  2.  Patient will increase Lower Extremity Functional Scale to at least 50% to demonstrate improvement in functional tasks. Baseline: 25% Goal status: Ongoing (see above)  3.  Patient will increase BERG to at least 42/56 to demonstrate decreased risk of falling. Baseline: 27/56 Goal status:  Ongoing (see above)  4.  Pt will report being able to ambulate at least 20 minutes with LRAD without increased pain and no loss of balance  to allow for community ambulation. Baseline:  Goal status: Ongoing  5.  Pt will increase LE strength to at least 4+/5 to allow him to perform sit to stand without UE use and navigate a curb with LRAD safely. Baseline:  Goal status: Ongoing   PLAN:  PT FREQUENCY: 2x/week  PT DURATION: 8 weeks  PLANNED INTERVENTIONS: Therapeutic exercises, Therapeutic activity, Neuromuscular re-education, Balance training, Gait training, Patient/Family education, Self Care, Joint mobilization, Joint manipulation, Stair training, Vestibular training, Canalith repositioning, Aquatic Therapy, Dry Needling, Electrical stimulation, Spinal manipulation, Spinal mobilization, Cryotherapy, Moist heat, Taping, Traction, Ultrasound, Ionotophoresis 4mg /ml Dexamethasone, Manual therapy, and Re-evaluation  PLAN FOR NEXT SESSION: Strengthening, balance, flexibility, focus on proximal hip and core strength, aquatics   Reather Laurence, PT, DPT 03/12/23, 10:26 AM  [facility] [address], [address] [place], [place] [address] Phone # [phone] Fax [phone]

## 2023-03-13 DIAGNOSIS — D0462 Carcinoma in situ of skin of left upper limb, including shoulder: Secondary | ICD-10-CM | POA: Diagnosis not present

## 2023-03-14 ENCOUNTER — Ambulatory Visit (HOSPITAL_BASED_OUTPATIENT_CLINIC_OR_DEPARTMENT_OTHER): Payer: Medicare HMO | Admitting: Physical Therapy

## 2023-03-14 ENCOUNTER — Encounter (HOSPITAL_BASED_OUTPATIENT_CLINIC_OR_DEPARTMENT_OTHER): Payer: Self-pay | Admitting: Physical Therapy

## 2023-03-14 DIAGNOSIS — R2689 Other abnormalities of gait and mobility: Secondary | ICD-10-CM

## 2023-03-14 DIAGNOSIS — R2681 Unsteadiness on feet: Secondary | ICD-10-CM | POA: Diagnosis not present

## 2023-03-14 DIAGNOSIS — M6281 Muscle weakness (generalized): Secondary | ICD-10-CM

## 2023-03-14 DIAGNOSIS — R262 Difficulty in walking, not elsewhere classified: Secondary | ICD-10-CM

## 2023-03-14 DIAGNOSIS — R293 Abnormal posture: Secondary | ICD-10-CM | POA: Diagnosis not present

## 2023-03-14 NOTE — Therapy (Signed)
OUTPATIENT PHYSICAL THERAPY TREATMENT NOTE   Patient Name: Brandon [name] MRN: [id number] DOB:11-20-1945, 77 y.o., male Today's Date: 03/14/2023  END OF SESSION:  PT End of Session - 03/14/23 1120     Visit Number 11    Date for PT Re-Evaluation 05/03/23    Authorization Type [employer]    Authorization - Number of Visits 16    Progress Note Due on Visit 18    PT Start Time 1113    PT Stop Time 1155    PT Time Calculation (min) 42 min    Behavior During Therapy WFL for tasks assessed/performed               Past Medical History:  Diagnosis Date   Allergy    Basal cell carcinoma    GERD (gastroesophageal reflux disease)    History of kidney cancer    benign; told not cancerous   Hypertension    Past Surgical History:  Procedure Laterality Date   CATARACT EXTRACTION Bilateral    CRANIOTOMY Bilateral 12/25/2021   Procedure: CRANIOTOMY HEMATOMA EVACUATION SUBDURAL;  Surgeon: Julio Sicks, MD;  Location: [facility] OR;  Service: Neurosurgery;  Laterality: Bilateral;   HERNIA REPAIR     x2   KIDNEY SURGERY Right    Had right kidney removed.    TONSILLECTOMY     VASECTOMY     Patient Active Problem List   Diagnosis Date Noted   Cognitive and neurobehavioral dysfunction following brain injury (HCC) 02/07/2022   Gait difficulty 02/07/2022   Traumatic brain injury with loss of consciousness (HCC) 12/30/2021   SDH (subdural hematoma) (HCC) 12/24/2021   Hyperlipidemia 02/13/2018   BPH (benign prostatic hyperplasia) 01/31/2017   Heart murmur 01/20/2016   HTN (hypertension) 03/09/2012    PCP: Shirline Frees, NP  REFERRING PROVIDER: Shirline Frees, NP  REFERRING DIAG: R26.81 (ICD-10-CM) - Gait instability  THERAPY DIAG:  Balance problem  Muscle weakness (generalized)  Unsteadiness on feet  Difficulty in walking, not elsewhere classified  Rationale for Evaluation and Treatment: Rehabilitation  ONSET DATE: Pt and wife report that frequent falls have been  occurring for the past year  SUBJECTIVE:   SUBJECTIVE STATEMENT:  Pt reports some knee pain, "it's the arthritis".   Nothing new to report.  Pt is observed pushing rollator to pool area from lobby.    PERTINENT HISTORY: Subdural Hematoma, HTN, GERD, Basal Cell Carcinoma, Hx of benign kidney mass, OA  PAIN:  Are you having pain? Yes: NPRS scale: 2-3/10 Pain location: bilateral knees (right greater than left) Pain description: sore Aggravating factors: unknown Relieving factors: Voltaren gel  PRECAUTIONS: Fall  RED FLAGS: None   WEIGHT BEARING RESTRICTIONS: No  FALLS:  Has patient fallen in last 6 months? Yes. Number of falls 3  LIVING ENVIRONMENT: Lives with: lives with their spouse Lives in: House/apartment Stairs:  one level home, one step to enter porch Has following equipment at home: Single point cane, Environmental consultant - 2 wheeled, Environmental consultant - 4 wheeled, shower chair, and Grab bars  OCCUPATION: retired  PLOF: Requires assistive device for independence and Leisure: walking with wife, going out to eat, movies  PATIENT GOALS: To be able to walk into wedding venue for granddaughter's wedding on September 21st, 2024.  NEXT MD VISIT: Shirline Frees on 07/01/2022  OBJECTIVE:   DIAGNOSTIC FINDINGS: Head CT on 04/27/2023: IMPRESSION: 1. Stable 6 mm acute on chronic left subdural hematoma. No associated midline shift. 2. Stable 6 mm chronic right subdural hematoma. 3. No new intracranial  abnormality. 4. No acute displaced fracture or traumatic listhesis of the cervical spine. 5. Persistent right apical subpleural 1.5 cm ground-glass nodular-like airspace opacity (increased in size from 2018). Adenocarcinoma is not excluded. Additional imaging evaluation or consultation with Pulmonology or Thoracic Surgery recommended.  PATIENT SURVEYS:  Eval:  LEFS 20 / 80 = 25.0 % 03/05/2023:  Lower Extremity Functional Score: 35 / 80 = 43.8 %  COGNITION: Overall cognitive status:  wife  reports occasionally pt has word finding problems and some memory impairments      SENSATION: Wife reports that sometimes left hand has "spasms"  MUSCLE LENGTH: Hamstrings: Tightness bilaterally  POSTURE: rounded shoulders, forward head, and flexed trunk    LOWER EXTREMITY ROM:  WFL  LOWER EXTREMITY MMT:  Eval: Right LE strength grossly 4-/5 Left LE strength grossly 4 to 4+/5 throughout  FUNCTIONAL TESTS:  Eval:   5 times sit to stand: 14.96 sec with UE use required Timed up and go (TUG): 13.71 sec without UE device with unsteadiness noted especially with 180 degree turn BERG:  27/56  01/24/2023: 3 minute walk:  378 ft with SPC (pt with shuffling gait pattern)  02/26/2023: Timed up and go (TUG): 11.69 sec 5 times sit to stand: 13.28 sec with UE use required 3 minute walk: 470 ft without assistive device  03/05/2023: BERG: 39/56  GAIT: Distance walked: >200 ft Assistive device utilized: Single point cane Level of assistance: SBA Comments: Pt with unsteady gait noted and requires SBA   TODAY'S TREATMENT:                                                                                                              03/14/2023: Pt seen for aquatic therapy today.  Treatment took place in water 3.5-4.75 ft in depth at the [facility] pool. Temp of water was 91.  Pt entered/exited the pool via stairs independently with bilat rail.  Pt ambulates to/from session with rollator; wife present on deck  In 57ft 8" with UE on barbell: forward/ backward walking x 4 laps,  side stepping 3 laps with cues for increased step height  high knee marching forward 2 laps;  * UE on wall:  3 way Toe taps x 5 each LE (unable to complete with UE on barbell); leg swings into hip flex/ ext x 10; hip abdct/ addct x 10 each LE; heel / toe raises x 10 * return to walking forward/ backward with barbell * box step x 5 each direction for work on weight shift and direction * staggered stance  with kick board row 2 x 10 * holding wall:  hamstring curls x 10 each  * hip hinge with hip bumps with forward arm reach, x 10, cues for form * STS at bench in water, with cues to slow speed and control descent with forward arm reach x 8   DATE:  03/12/2023: Nustep level 5 x6 min with PT present to discuss status Standing with 3# on bilat ankles:  heel/toe raises, high marching, hip abduction, hip extension.  2x10 each bilat Sit to/from stand (seated on foam pad) 2x10 Seated hip abduction clamshell with blue tband 2x10 Seated hamstring curl with blue tband 2x10 bilat Alt toe tap to 6" step 2x10  bilat FWD step up on 6" step with UE support 2x10 bilat Leg Press (seat at 9) 110# x10, 120# x10 FWD step over 4 small hurdles by barre down and back x3 Lateral step over 4 small hurdles by barre down and back x3 Rocker board DF/PF x2 min   03/08/2023: Pt seen for aquatic therapy today.  Treatment took place in water 3.5-4.75 ft in depth at the [facility] pool. Temp of water was 91.  Pt entered/exited the pool via stairs independently with bilat rail.  In 80ft 8" with UE on yellow noodle: forward/ backward walking, high knee marching forward/ backwards;  * UE on wall:  high knee marching;  hip openers (hip abdct/knee flexion) alternating LEs * unsupported side stepping R/L -> UE on noodle x 3 laps * UE on wall:  hip abdct/ addct x 15 each LE; heel / toe raises x 10; leg swings into hip flex/ ext x 10 (tight hip flexors); 2 fingers on wall- SLS x 10s each LE x 2  * UE on noodle with CGA of therapist:  tandem gait forward/ backward (challenge)  * UE on yellow hand floats:  forward step and return to neutral x 5 each LE; box step R/L for work on weight shift and direction changes * toe taps with first step with intermittent UE support x 5 each LE (alternating LEs- challenge!)   PATIENT EDUCATION:  Education details: Issued HEP Person educated: Patient and Spouse Education method:  Explanation, Demonstration, and Handouts Education comprehension: verbalized understanding  HOME EXERCISE PROGRAM: Access Code: [email or web address] URL: [email or web address] Date: 01/10/2023 Prepared by: Clydie Braun Menke  Exercises - Sit to Stand  - 1 x daily - 7 x weekly - 2 sets - 10 reps - Standing Hip Abduction with Counter Support  - 1 x daily - 7 x weekly - 2 sets - 10 reps - Standing Hip Extension with Counter Support  - 1 x daily - 7 x weekly - 2 sets - 10 reps - Standing March with Counter Support  - 1 x daily - 7 x weekly - 2 sets - 10 reps - Heel Raises with Counter Support  - 1 x daily - 7 x weekly - 2 sets - 10 reps  ASSESSMENT:  CLINICAL IMPRESSION:  Pt able to take direction from therapist on deck, as his confidence in water has grown.   His backwards gait in water is much smoother than previous sessions.  He requires minor cues to allow Rt knee to bend during swing through phase of gait.  He requires UE support on floatation device (noodle/barbell) due to unsteadiness in Rt  stance phase and change in direction.  Pt will benefit from continued skilled PT intervention to address balance and strength deficits.  He is making good progress towards LTGs.   OBJECTIVE IMPAIRMENTS: decreased balance, difficulty walking, decreased strength, impaired flexibility, postural dysfunction, and pain.   ACTIVITY LIMITATIONS: carrying, lifting, bending, standing, squatting, and stairs  PARTICIPATION LIMITATIONS: cleaning and community activity  PERSONAL FACTORS: Past/current experiences, Time since onset of injury/illness/exacerbation, and 3+ comorbidities: subdural hematoma, HTN, OA  are also affecting patient's functional outcome.   REHAB POTENTIAL: Good  CLINICAL DECISION MAKING: Evolving/moderate complexity  EVALUATION COMPLEXITY: Moderate   GOALS: Goals reviewed with patient? Yes  SHORT TERM GOALS:  Target date: 02/01/2023 Pt will be independent with initial  HEP. Baseline: Goal status: MET  2.  Pt will improve ambulation safety with least restrictive assistive device to allow him to safely ambulate distances required for granddaughter's wedding on 02/02/2023. Baseline:  Goal status: Met   LONG TERM GOALS: Target date: 05/03/2023 (Goals updated on 03/05/2023)  Pt will be independent with advanced HEP to allow for self progression post discharge. Baseline:  Goal status: Ongoing  2.  Patient will increase Lower Extremity Functional Scale to at least 50% to demonstrate improvement in functional tasks. Baseline: 25% Goal status: Ongoing (see above)  3.  Patient will increase BERG to at least 42/56 to demonstrate decreased risk of falling. Baseline: 27/56 Goal status: Ongoing (see above)  4.  Pt will report being able to ambulate at least 20 minutes with LRAD without increased pain and no loss of balance to allow for community ambulation. Baseline:  Goal status: Ongoing  5.  Pt will increase LE strength to at least 4+/5 to allow him to perform sit to stand without UE use and navigate a curb with LRAD safely. Baseline:  Goal status: Ongoing   PLAN:  PT FREQUENCY: 2x/week  PT DURATION: 8 weeks  PLANNED INTERVENTIONS: Therapeutic exercises, Therapeutic activity, Neuromuscular re-education, Balance training, Gait training, Patient/Family education, Self Care, Joint mobilization, Joint manipulation, Stair training, Vestibular training, Canalith repositioning, Aquatic Therapy, Dry Needling, Electrical stimulation, Spinal manipulation, Spinal mobilization, Cryotherapy, Moist heat, Taping, Traction, Ultrasound, Ionotophoresis 4mg /ml Dexamethasone, Manual therapy, and Re-evaluation  PLAN FOR NEXT SESSION: Strengthening, balance, flexibility, focus on proximal hip and core strength, aquatics  Mayer Camel, PTA 03/14/23 3:30 PM [facility] [address] [place], [place],  [address] Phone: [phone]   Fax:  [phone]

## 2023-03-18 DIAGNOSIS — D0462 Carcinoma in situ of skin of left upper limb, including shoulder: Secondary | ICD-10-CM | POA: Diagnosis not present

## 2023-03-19 ENCOUNTER — Ambulatory Visit: Payer: Medicare HMO | Attending: Adult Health | Admitting: Rehabilitative and Restorative Service Providers"

## 2023-03-19 ENCOUNTER — Encounter: Payer: Self-pay | Admitting: Rehabilitative and Restorative Service Providers"

## 2023-03-19 DIAGNOSIS — R2689 Other abnormalities of gait and mobility: Secondary | ICD-10-CM | POA: Diagnosis not present

## 2023-03-19 DIAGNOSIS — R293 Abnormal posture: Secondary | ICD-10-CM | POA: Diagnosis not present

## 2023-03-19 DIAGNOSIS — M6281 Muscle weakness (generalized): Secondary | ICD-10-CM | POA: Diagnosis not present

## 2023-03-19 DIAGNOSIS — R2681 Unsteadiness on feet: Secondary | ICD-10-CM | POA: Insufficient documentation

## 2023-03-19 DIAGNOSIS — R262 Difficulty in walking, not elsewhere classified: Secondary | ICD-10-CM | POA: Insufficient documentation

## 2023-03-19 NOTE — Therapy (Signed)
OUTPATIENT PHYSICAL THERAPY TREATMENT NOTE   Patient Name: Brandon [name] MRN: [id number] DOB:17-May-1945, 77 y.o., male Today's Date: 03/19/2023  END OF SESSION:  PT End of Session - 03/19/23 0937     Visit Number 12    Date for PT Re-Evaluation 05/03/23    Authorization Type [employer]    Authorization Time Period 03/09/23 - 05/03/2023    Authorization - Visit Number 3    Authorization - Number of Visits 10    Progress Note Due on Visit 18    PT Start Time 0930    PT Stop Time 1010    PT Time Calculation (min) 40 min    Activity Tolerance Patient tolerated treatment well    Behavior During Therapy WFL for tasks assessed/performed               Past Medical History:  Diagnosis Date   Allergy    Basal cell carcinoma    GERD (gastroesophageal reflux disease)    History of kidney cancer    benign; told not cancerous   Hypertension    Past Surgical History:  Procedure Laterality Date   CATARACT EXTRACTION Bilateral    CRANIOTOMY Bilateral 12/25/2021   Procedure: CRANIOTOMY HEMATOMA EVACUATION SUBDURAL;  Surgeon: Julio Sicks, MD;  Location: [facility] OR;  Service: Neurosurgery;  Laterality: Bilateral;   HERNIA REPAIR     x2   KIDNEY SURGERY Right    Had right kidney removed.    TONSILLECTOMY     VASECTOMY     Patient Active Problem List   Diagnosis Date Noted   Cognitive and neurobehavioral dysfunction following brain injury (HCC) 02/07/2022   Gait difficulty 02/07/2022   Traumatic brain injury with loss of consciousness (HCC) 12/30/2021   SDH (subdural hematoma) (HCC) 12/24/2021   Hyperlipidemia 02/13/2018   BPH (benign prostatic hyperplasia) 01/31/2017   Heart murmur 01/20/2016   HTN (hypertension) 03/09/2012    PCP: Shirline Frees, NP  REFERRING PROVIDER: Shirline Frees, NP  REFERRING DIAG: R26.81 (ICD-10-CM) - Gait instability  THERAPY DIAG:  Balance problem  Muscle weakness (generalized)  Unsteadiness on feet  Difficulty in walking, not  elsewhere classified  Rationale for Evaluation and Treatment: Rehabilitation  ONSET DATE: Pt and wife report that frequent falls have been occurring for the past year  SUBJECTIVE:   SUBJECTIVE STATEMENT:  Pt with bandage on his left hand from melanoma spot that he had removed.  Patient unable to go to the pool this week secondary to recent procedure.  PERTINENT HISTORY: Subdural Hematoma, HTN, GERD, Basal Cell Carcinoma, Hx of benign kidney mass, OA  PAIN:  Are you having pain? Yes: NPRS scale: 0-2/10 Pain location: bilateral knees (right greater than left) Pain description: sore Aggravating factors: unknown Relieving factors: Voltaren gel  PRECAUTIONS: Fall  RED FLAGS: None   WEIGHT BEARING RESTRICTIONS: No  FALLS:  Has patient fallen in last 6 months? Yes. Number of falls 3  LIVING ENVIRONMENT: Lives with: lives with their spouse Lives in: House/apartment Stairs:  one level home, one step to enter porch Has following equipment at home: Single point cane, Environmental consultant - 2 wheeled, Environmental consultant - 4 wheeled, shower chair, and Grab bars  OCCUPATION: retired  PLOF: Requires assistive device for independence and Leisure: walking with wife, going out to eat, movies  PATIENT GOALS: To be able to walk into wedding venue for granddaughter's wedding on September 21st, 2024.  NEXT MD VISIT: Shirline Frees on 07/01/2022  OBJECTIVE:   DIAGNOSTIC FINDINGS: Head CT on 04/27/2023:  IMPRESSION: 1. Stable 6 mm acute on chronic left subdural hematoma. No associated midline shift. 2. Stable 6 mm chronic right subdural hematoma. 3. No new intracranial abnormality. 4. No acute displaced fracture or traumatic listhesis of the cervical spine. 5. Persistent right apical subpleural 1.5 cm ground-glass nodular-like airspace opacity (increased in size from 2018). Adenocarcinoma is not excluded. Additional imaging evaluation or consultation with Pulmonology or Thoracic Surgery recommended.  PATIENT  SURVEYS:  Eval:  LEFS 20 / 80 = 25.0 % 03/05/2023:  Lower Extremity Functional Score: 35 / 80 = 43.8 %  COGNITION: Overall cognitive status:  wife reports occasionally pt has word finding problems and some memory impairments      SENSATION: Wife reports that sometimes left hand has "spasms"  MUSCLE LENGTH: Hamstrings: Tightness bilaterally  POSTURE: rounded shoulders, forward head, and flexed trunk    LOWER EXTREMITY ROM:  WFL  LOWER EXTREMITY MMT:  Eval: Right LE strength grossly 4-/5 Left LE strength grossly 4 to 4+/5 throughout  FUNCTIONAL TESTS:  Eval:   5 times sit to stand: 14.96 sec with UE use required Timed up and go (TUG): 13.71 sec without UE device with unsteadiness noted especially with 180 degree turn BERG:  27/56  01/24/2023: 3 minute walk:  378 ft with SPC (pt with shuffling gait pattern)  02/26/2023: Timed up and go (TUG): 11.69 sec 5 times sit to stand: 13.28 sec with UE use required 3 minute walk: 470 ft without assistive device  03/05/2023: BERG: 39/56  GAIT: Distance walked: >200 ft Assistive device utilized: Single point cane Level of assistance: SBA Comments: Pt with unsteady gait noted and requires SBA   TODAY'S TREATMENT:                                                                                                               DATE:  03/19/2023: Nustep level 5 x6 min with PT present to discuss status Seated LAQ with 3# on ankles 2x10 bilat Standing with 3# on bilat ankles:  heel/toe raises, high marching, hip abduction, hip extension.  2x10 each bilat Sit to/from stand (seated on foam pad) 2x10 Seated hip abduction clamshell with blue tband 2x10 Seated hamstring curl with blue tband 2x10 bilat Rocker board DF/PF x2 min FWD step over 4 small hurdles by barre down and back x3 Lateral step over 4 small hurdles by barre down and back x3 Leg Press (seat at 9) 120# 2x10 Alt toe tap to 6" step 2x10  bilat   03/14/2023: Pt seen for  aquatic therapy today.  Treatment took place in water 3.5-4.75 ft in depth at the [facility] pool. Temp of water was 91.  Pt entered/exited the pool via stairs independently with bilat rail.  Pt ambulates to/from session with rollator; wife present on deck  In 18ft 8" with UE on barbell: forward/ backward walking x 4 laps,  side stepping 3 laps with cues for increased step height  high knee marching forward 2 laps;  * UE on wall:  3 way Toe taps  x 5 each LE (unable to complete with UE on barbell); leg swings into hip flex/ ext x 10; hip abdct/ addct x 10 each LE; heel / toe raises x 10 * return to walking forward/ backward with barbell * box step x 5 each direction for work on weight shift and direction * staggered stance with kick board row 2 x 10 * holding wall:  hamstring curls x 10 each  * hip hinge with hip bumps with forward arm reach, x 10, cues for form * STS at bench in water, with cues to slow speed and control descent with forward arm reach x 8    03/12/2023: Nustep level 5 x6 min with PT present to discuss status Standing with 3# on bilat ankles:  heel/toe raises, high marching, hip abduction, hip extension.  2x10 each bilat Sit to/from stand (seated on foam pad) 2x10 Seated hip abduction clamshell with blue tband 2x10 Seated hamstring curl with blue tband 2x10 bilat Alt toe tap to 6" step 2x10  bilat FWD step up on 6" step with UE support 2x10 bilat Leg Press (seat at 9) 110# x10, 120# x10 FWD step over 4 small hurdles by barre down and back x3 Lateral step over 4 small hurdles by barre down and back x3 Rocker board DF/PF x2 min   PATIENT EDUCATION:  Education details: Issued HEP Person educated: Patient and Spouse Education method: Explanation, Facilities manager, and Handouts Education comprehension: verbalized understanding  HOME EXERCISE PROGRAM: Access Code: [email or web address] URL: [email or web address] Date: 01/10/2023 Prepared by: Clydie Braun  Charmain Diosdado  Exercises - Sit to Stand  - 1 x daily - 7 x weekly - 2 sets - 10 reps - Standing Hip Abduction with Counter Support  - 1 x daily - 7 x weekly - 2 sets - 10 reps - Standing Hip Extension with Counter Support  - 1 x daily - 7 x weekly - 2 sets - 10 reps - Standing March with Counter Support  - 1 x daily - 7 x weekly - 2 sets - 10 reps - Heel Raises with Counter Support  - 1 x daily - 7 x weekly - 2 sets - 10 reps  ASSESSMENT:  CLINICAL IMPRESSION:  Mr Mcmanaman presents to skilled PT with bandage on his hand from getting a melanoma removed from his hand.  Pt is progressing with increased weights on leg press.  Patient continues to require UE support with standing exercises secondary to decreased balance.  Patient requires close SBA with standing balance tasks.  Patient continues to require skilled PT to progress towards goal related activities.  OBJECTIVE IMPAIRMENTS: decreased balance, difficulty walking, decreased strength, impaired flexibility, postural dysfunction, and pain.   ACTIVITY LIMITATIONS: carrying, lifting, bending, standing, squatting, and stairs  PARTICIPATION LIMITATIONS: cleaning and community activity  PERSONAL FACTORS: Past/current experiences, Time since onset of injury/illness/exacerbation, and 3+ comorbidities: subdural hematoma, HTN, OA  are also affecting patient's functional outcome.   REHAB POTENTIAL: Good  CLINICAL DECISION MAKING: Evolving/moderate complexity  EVALUATION COMPLEXITY: Moderate   GOALS: Goals reviewed with patient? Yes  SHORT TERM GOALS: Target date: 02/01/2023 Pt will be independent with initial HEP. Baseline: Goal status: MET  2.  Pt will improve ambulation safety with least restrictive assistive device to allow him to safely ambulate distances required for granddaughter's wedding on 02/02/2023. Baseline:  Goal status: Met   LONG TERM GOALS: Target date: 05/03/2023 (Goals updated on 03/05/2023)  Pt will be independent with  advanced HEP to allow for self progression post discharge.  Baseline:  Goal status: Ongoing  2.  Patient will increase Lower Extremity Functional Scale to at least 50% to demonstrate improvement in functional tasks. Baseline: 25% Goal status: Ongoing (see above)  3.  Patient will increase BERG to at least 42/56 to demonstrate decreased risk of falling. Baseline: 27/56 Goal status: Ongoing (see above)  4.  Pt will report being able to ambulate at least 20 minutes with LRAD without increased pain and no loss of balance to allow for community ambulation. Baseline:  Goal status: Ongoing  5.  Pt will increase LE strength to at least 4+/5 to allow him to perform sit to stand without UE use and navigate a curb with LRAD safely. Baseline:  Goal status: Ongoing   PLAN:  PT FREQUENCY: 2x/week  PT DURATION: 8 weeks  PLANNED INTERVENTIONS: Therapeutic exercises, Therapeutic activity, Neuromuscular re-education, Balance training, Gait training, Patient/Family education, Self Care, Joint mobilization, Joint manipulation, Stair training, Vestibular training, Canalith repositioning, Aquatic Therapy, Dry Needling, Electrical stimulation, Spinal manipulation, Spinal mobilization, Cryotherapy, Moist heat, Taping, Traction, Ultrasound, Ionotophoresis 4mg /ml Dexamethasone, Manual therapy, and Re-evaluation  PLAN FOR NEXT SESSION: Strengthening, balance, flexibility, focus on proximal hip and core strength, aquatics  Reather Laurence, PT, DPT 03/19/23, 10:35 AM  [facility] [address] [place], [place] [address] Phone # [phone] Fax [phone]

## 2023-03-22 ENCOUNTER — Ambulatory Visit (HOSPITAL_BASED_OUTPATIENT_CLINIC_OR_DEPARTMENT_OTHER): Payer: Medicare HMO | Admitting: Physical Therapy

## 2023-03-26 ENCOUNTER — Ambulatory Visit: Payer: Medicare HMO | Admitting: Rehabilitative and Restorative Service Providers"

## 2023-03-26 ENCOUNTER — Encounter: Payer: Self-pay | Admitting: Rehabilitative and Restorative Service Providers"

## 2023-03-26 DIAGNOSIS — R293 Abnormal posture: Secondary | ICD-10-CM | POA: Diagnosis not present

## 2023-03-26 DIAGNOSIS — M6281 Muscle weakness (generalized): Secondary | ICD-10-CM

## 2023-03-26 DIAGNOSIS — R262 Difficulty in walking, not elsewhere classified: Secondary | ICD-10-CM

## 2023-03-26 DIAGNOSIS — R2681 Unsteadiness on feet: Secondary | ICD-10-CM | POA: Diagnosis not present

## 2023-03-26 DIAGNOSIS — R2689 Other abnormalities of gait and mobility: Secondary | ICD-10-CM

## 2023-03-26 NOTE — Therapy (Signed)
OUTPATIENT PHYSICAL THERAPY TREATMENT NOTE   Patient Name: Brandon [name] MRN: [id number] DOB:12/07/1945, 77 y.o., male Today's Date: 03/26/2023  END OF SESSION:  PT End of Session - 03/26/23 0925     Visit Number 13    Date for PT Re-Evaluation 05/03/23    Authorization Type [employer]    Authorization Time Period 03/09/23 - 05/03/2023    Authorization - Visit Number 4    Authorization - Number of Visits 10    Progress Note Due on Visit 18    PT Start Time 0921    PT Stop Time 1000    PT Time Calculation (min) 39 min    Activity Tolerance Patient tolerated treatment well    Behavior During Therapy WFL for tasks assessed/performed               Past Medical History:  Diagnosis Date   Allergy    Basal cell carcinoma    GERD (gastroesophageal reflux disease)    History of kidney cancer    benign; told not cancerous   Hypertension    Past Surgical History:  Procedure Laterality Date   CATARACT EXTRACTION Bilateral    CRANIOTOMY Bilateral 12/25/2021   Procedure: CRANIOTOMY HEMATOMA EVACUATION SUBDURAL;  Surgeon: Julio Sicks, MD;  Location: [facility] OR;  Service: Neurosurgery;  Laterality: Bilateral;   HERNIA REPAIR     x2   KIDNEY SURGERY Right    Had right kidney removed.    TONSILLECTOMY     VASECTOMY     Patient Active Problem List   Diagnosis Date Noted   Cognitive and neurobehavioral dysfunction following brain injury (HCC) 02/07/2022   Gait difficulty 02/07/2022   Traumatic brain injury with loss of consciousness (HCC) 12/30/2021   SDH (subdural hematoma) (HCC) 12/24/2021   Hyperlipidemia 02/13/2018   BPH (benign prostatic hyperplasia) 01/31/2017   Heart murmur 01/20/2016   HTN (hypertension) 03/09/2012    PCP: Shirline Frees, NP  REFERRING PROVIDER: Shirline Frees, NP  REFERRING DIAG: R26.81 (ICD-10-CM) - Gait instability  THERAPY DIAG:  Balance problem  Muscle weakness (generalized)  Unsteadiness on feet  Difficulty in walking, not  elsewhere classified  Rationale for Evaluation and Treatment: Rehabilitation  ONSET DATE: Pt and wife report that frequent falls have been occurring for the past year  SUBJECTIVE:   SUBJECTIVE STATEMENT:  Pt with bandage on his left hand from carcinoma spot that he had removed.  Patient states that he only has his minimal knee pain.  Pt denies any falls.  PERTINENT HISTORY: Subdural Hematoma, HTN, GERD, Basal Cell Carcinoma, Hx of benign kidney mass, OA  PAIN:  Are you having pain? Yes: NPRS scale: 0-2/10 Pain location: bilateral knees (right greater than left) Pain description: sore Aggravating factors: unknown Relieving factors: Voltaren gel  PRECAUTIONS: Fall  RED FLAGS: None   WEIGHT BEARING RESTRICTIONS: No  FALLS:  Has patient fallen in last 6 months? Yes. Number of falls 3  LIVING ENVIRONMENT: Lives with: lives with their spouse Lives in: House/apartment Stairs:  one level home, one step to enter porch Has following equipment at home: Single point cane, Environmental consultant - 2 wheeled, Environmental consultant - 4 wheeled, shower chair, and Grab bars  OCCUPATION: retired  PLOF: Requires assistive device for independence and Leisure: walking with wife, going out to eat, movies  PATIENT GOALS: To be able to walk into wedding venue for granddaughter's wedding on September 21st, 2024.  NEXT MD VISIT: Shirline Frees on 07/01/2022  OBJECTIVE:   DIAGNOSTIC FINDINGS: Head CT  on 04/27/2023: IMPRESSION: 1. Stable 6 mm acute on chronic left subdural hematoma. No associated midline shift. 2. Stable 6 mm chronic right subdural hematoma. 3. No new intracranial abnormality. 4. No acute displaced fracture or traumatic listhesis of the cervical spine. 5. Persistent right apical subpleural 1.5 cm ground-glass nodular-like airspace opacity (increased in size from 2018). Adenocarcinoma is not excluded. Additional imaging evaluation or consultation with Pulmonology or Thoracic Surgery  recommended.  PATIENT SURVEYS:  Eval:  LEFS 20 / 80 = 25.0 % 03/05/2023:  Lower Extremity Functional Score: 35 / 80 = 43.8 %  COGNITION: Overall cognitive status:  wife reports occasionally pt has word finding problems and some memory impairments      SENSATION: Wife reports that sometimes left hand has "spasms"  MUSCLE LENGTH: Hamstrings: Tightness bilaterally  POSTURE: rounded shoulders, forward head, and flexed trunk    LOWER EXTREMITY ROM:  WFL  LOWER EXTREMITY MMT:  Eval: Right LE strength grossly 4-/5 Left LE strength grossly 4 to 4+/5 throughout  FUNCTIONAL TESTS:  Eval:   5 times sit to stand: 14.96 sec with UE use required Timed up and go (TUG): 13.71 sec without UE device with unsteadiness noted especially with 180 degree turn BERG:  27/56  01/24/2023: 3 minute walk:  378 ft with SPC (pt with shuffling gait pattern)  02/26/2023: Timed up and go (TUG): 11.69 sec 5 times sit to stand: 13.28 sec with UE use required 3 minute walk: 470 ft without assistive device  03/05/2023: BERG: 39/56  GAIT: Distance walked: >200 ft Assistive device utilized: Single point cane Level of assistance: SBA Comments: Pt with unsteady gait noted and requires SBA   TODAY'S TREATMENT:                                                                                                               DATE:  03/26/2023: Nustep level 5 x6 min with PT present to discuss status Seated LAQ and marching with 3# on ankles 2x10 each bilat Standing with 3# on bilat ankles:  heel/toe raises, high marching, hip abduction, hip extension.  2x10 each bilat Sit to/from stand (seated on 2 foam pads) 2x6 with cuing to not use UE Alt toe tap to 6" step 2x10  bilat Leg Press (seat at 9) 120# 2x10 FWD step ups to 6" step with UE support 2x10 bilat Rocker board DF/PF x2 min   03/19/2023: Nustep level 5 x6 min with PT present to discuss status Seated LAQ with 3# on ankles 2x10 bilat Standing with  3# on bilat ankles:  heel/toe raises, high marching, hip abduction, hip extension.  2x10 each bilat Sit to/from stand (seated on foam pad) 2x10 (with UE use required) Seated hip abduction clamshell with blue tband 2x10 Seated hamstring curl with blue tband 2x10 bilat Rocker board DF/PF x2 min FWD step over 4 small hurdles by barre down and back x3 Lateral step over 4 small hurdles by barre down and back x3 Leg Press (seat at 9) 120# 2x10 Alt toe tap to 6"  step 2x10  bilat   03/14/2023: Pt seen for aquatic therapy today.  Treatment took place in water 3.5-4.75 ft in depth at the [facility] pool. Temp of water was 91.  Pt entered/exited the pool via stairs independently with bilat rail.  Pt ambulates to/from session with rollator; wife present on deck  In 75ft 8" with UE on barbell: forward/ backward walking x 4 laps,  side stepping 3 laps with cues for increased step height  high knee marching forward 2 laps;  * UE on wall:  3 way Toe taps x 5 each LE (unable to complete with UE on barbell); leg swings into hip flex/ ext x 10; hip abdct/ addct x 10 each LE; heel / toe raises x 10 * return to walking forward/ backward with barbell * box step x 5 each direction for work on weight shift and direction * staggered stance with kick board row 2 x 10 * holding wall:  hamstring curls x 10 each  * hip hinge with hip bumps with forward arm reach, x 10, cues for form * STS at bench in water, with cues to slow speed and control descent with forward arm reach x 8    PATIENT EDUCATION:  Education details: Issued HEP Person educated: Patient and Spouse Education method: Explanation, Demonstration, and Handouts Education comprehension: verbalized understanding  HOME EXERCISE PROGRAM: Access Code: [email or web address] URL: [email or web address] Date: 01/10/2023 Prepared by: Clydie Braun Vaudine Dutan  Exercises - Sit to Stand  - 1 x daily - 7 x weekly - 2 sets - 10 reps - Standing Hip Abduction  with Counter Support  - 1 x daily - 7 x weekly - 2 sets - 10 reps - Standing Hip Extension with Counter Support  - 1 x daily - 7 x weekly - 2 sets - 10 reps - Standing March with Counter Support  - 1 x daily - 7 x weekly - 2 sets - 10 reps - Heel Raises with Counter Support  - 1 x daily - 7 x weekly - 2 sets - 10 reps  ASSESSMENT:  CLINICAL IMPRESSION:  Mr Pryor presents to skilled PT with only complaints of knee pain.  Patient's hand is healing where he had the carcinoma removed last week.  Patient is progressing with improved LE strength and standing tolerance during session.  Patient continues to require occasional seated recovery periods, particularly after standing exercises.  Patient with difficulty with sit to stand without using UE and required more elevated surface when not using UE.  OBJECTIVE IMPAIRMENTS: decreased balance, difficulty walking, decreased strength, impaired flexibility, postural dysfunction, and pain.   ACTIVITY LIMITATIONS: carrying, lifting, bending, standing, squatting, and stairs  PARTICIPATION LIMITATIONS: cleaning and community activity  PERSONAL FACTORS: Past/current experiences, Time since onset of injury/illness/exacerbation, and 3+ comorbidities: subdural hematoma, HTN, OA  are also affecting patient's functional outcome.   REHAB POTENTIAL: Good  CLINICAL DECISION MAKING: Evolving/moderate complexity  EVALUATION COMPLEXITY: Moderate   GOALS: Goals reviewed with patient? Yes  SHORT TERM GOALS: Target date: 02/01/2023 Pt will be independent with initial HEP. Baseline: Goal status: MET  2.  Pt will improve ambulation safety with least restrictive assistive device to allow him to safely ambulate distances required for granddaughter's wedding on 02/02/2023. Baseline:  Goal status: Met   LONG TERM GOALS: Target date: 05/03/2023 (Goals updated on 03/05/2023)  Pt will be independent with advanced HEP to allow for self progression post  discharge. Baseline:  Goal status: Ongoing  2.  Patient will increase Lower  Extremity Functional Scale to at least 50% to demonstrate improvement in functional tasks. Baseline: 25% Goal status: Ongoing (see above)  3.  Patient will increase BERG to at least 42/56 to demonstrate decreased risk of falling. Baseline: 27/56 Goal status: Ongoing (see above)  4.  Pt will report being able to ambulate at least 20 minutes with LRAD without increased pain and no loss of balance to allow for community ambulation. Baseline:  Goal status: Ongoing  5.  Pt will increase LE strength to at least 4+/5 to allow him to perform sit to stand without UE use and navigate a curb with LRAD safely. Baseline:  Goal status: Ongoing   PLAN:  PT FREQUENCY: 2x/week  PT DURATION: 8 weeks  PLANNED INTERVENTIONS: Therapeutic exercises, Therapeutic activity, Neuromuscular re-education, Balance training, Gait training, Patient/Family education, Self Care, Joint mobilization, Joint manipulation, Stair training, Vestibular training, Canalith repositioning, Aquatic Therapy, Dry Needling, Electrical stimulation, Spinal manipulation, Spinal mobilization, Cryotherapy, Moist heat, Taping, Traction, Ultrasound, Ionotophoresis 4mg /ml Dexamethasone, Manual therapy, and Re-evaluation  PLAN FOR NEXT SESSION: Strengthening, balance, flexibility, focus on proximal hip and core strength, aquatics  Reather Laurence, PT, DPT 03/26/23, 10:04 AM  [facility] [address] [place], [place] [address] Phone # [phone] Fax [phone]

## 2023-03-27 DIAGNOSIS — S065XAA Traumatic subdural hemorrhage with loss of consciousness status unknown, initial encounter: Secondary | ICD-10-CM | POA: Diagnosis not present

## 2023-03-27 DIAGNOSIS — Z6827 Body mass index (BMI) 27.0-27.9, adult: Secondary | ICD-10-CM | POA: Diagnosis not present

## 2023-03-29 ENCOUNTER — Ambulatory Visit: Payer: Medicare HMO | Admitting: Physical Therapy

## 2023-03-29 ENCOUNTER — Encounter: Payer: Self-pay | Admitting: Physical Therapy

## 2023-03-29 DIAGNOSIS — R262 Difficulty in walking, not elsewhere classified: Secondary | ICD-10-CM | POA: Diagnosis not present

## 2023-03-29 DIAGNOSIS — M6281 Muscle weakness (generalized): Secondary | ICD-10-CM | POA: Diagnosis not present

## 2023-03-29 DIAGNOSIS — R2681 Unsteadiness on feet: Secondary | ICD-10-CM

## 2023-03-29 DIAGNOSIS — R2689 Other abnormalities of gait and mobility: Secondary | ICD-10-CM

## 2023-03-29 DIAGNOSIS — R293 Abnormal posture: Secondary | ICD-10-CM

## 2023-03-29 NOTE — Therapy (Signed)
OUTPATIENT PHYSICAL THERAPY TREATMENT NOTE   Patient Name: Brandon [name] MRN: [id number] DOB:02/18/1946, 77 y.o., male Today's Date: 03/29/2023  END OF SESSION:  PT End of Session - 03/29/23 1710     Visit Number 14    Date for PT Re-Evaluation 05/03/23    Authorization Type [employer]    Authorization - Number of Visits 10    Progress Note Due on Visit 18    PT Start Time 1345    PT Stop Time 1423    PT Time Calculation (min) 38 min    Activity Tolerance Patient tolerated treatment well    Behavior During Therapy WFL for tasks assessed/performed               Past Medical History:  Diagnosis Date   Allergy    Basal cell carcinoma    GERD (gastroesophageal reflux disease)    History of kidney cancer    benign; told not cancerous   Hypertension    Past Surgical History:  Procedure Laterality Date   CATARACT EXTRACTION Bilateral    CRANIOTOMY Bilateral 12/25/2021   Procedure: CRANIOTOMY HEMATOMA EVACUATION SUBDURAL;  Surgeon: Julio Sicks, MD;  Location: [facility] OR;  Service: Neurosurgery;  Laterality: Bilateral;   HERNIA REPAIR     x2   KIDNEY SURGERY Right    Had right kidney removed.    TONSILLECTOMY     VASECTOMY     Patient Active Problem List   Diagnosis Date Noted   Cognitive and neurobehavioral dysfunction following brain injury (HCC) 02/07/2022   Gait difficulty 02/07/2022   Traumatic brain injury with loss of consciousness (HCC) 12/30/2021   SDH (subdural hematoma) (HCC) 12/24/2021   Hyperlipidemia 02/13/2018   BPH (benign prostatic hyperplasia) 01/31/2017   Heart murmur 01/20/2016   HTN (hypertension) 03/09/2012    PCP: Shirline Frees, NP  REFERRING PROVIDER: Shirline Frees, NP  REFERRING DIAG: R26.81 (ICD-10-CM) - Gait instability  THERAPY DIAG:  Balance problem  Muscle weakness (generalized)  Unsteadiness on feet  Difficulty in walking, not elsewhere classified  Abnormal posture  Rationale for Evaluation and Treatment:  Rehabilitation  ONSET DATE: Pt and wife report that frequent falls have been occurring for the past year  SUBJECTIVE:   SUBJECTIVE STATEMENT:  Pt with bandage on his left hand from carcinoma spot that he had removed.  Patient states that he only has his minimal knee pain.  Pt denies any falls.  PERTINENT HISTORY: Subdural Hematoma, HTN, GERD, Basal Cell Carcinoma, Hx of benign kidney mass, OA  PAIN:  Are you having pain? Yes: NPRS scale: 0-2/10 Pain location: bilateral knees (right greater than left) Pain description: sore Aggravating factors: unknown Relieving factors: Voltaren gel  PRECAUTIONS: Fall  RED FLAGS: None   WEIGHT BEARING RESTRICTIONS: No  FALLS:  Has patient fallen in last 6 months? Yes. Number of falls 3  LIVING ENVIRONMENT: Lives with: lives with their spouse Lives in: House/apartment Stairs:  one level home, one step to enter porch Has following equipment at home: Single point cane, Environmental consultant - 2 wheeled, Environmental consultant - 4 wheeled, shower chair, and Grab bars  OCCUPATION: retired  PLOF: Requires assistive device for independence and Leisure: walking with wife, going out to eat, movies  PATIENT GOALS: To be able to walk into wedding venue for granddaughter's wedding on September 21st, 2024.  NEXT MD VISIT: Shirline Frees on 07/01/2022  OBJECTIVE:   DIAGNOSTIC FINDINGS: Head CT on 04/27/2023: IMPRESSION: 1. Stable 6 mm acute on chronic left subdural hematoma. No  associated midline shift. 2. Stable 6 mm chronic right subdural hematoma. 3. No new intracranial abnormality. 4. No acute displaced fracture or traumatic listhesis of the cervical spine. 5. Persistent right apical subpleural 1.5 cm ground-glass nodular-like airspace opacity (increased in size from 2018). Adenocarcinoma is not excluded. Additional imaging evaluation or consultation with Pulmonology or Thoracic Surgery recommended.  PATIENT SURVEYS:  Eval:  LEFS 20 / 80 = 25.0 % 03/05/2023:  Lower  Extremity Functional Score: 35 / 80 = 43.8 %  COGNITION: Overall cognitive status:  wife reports occasionally pt has word finding problems and some memory impairments      SENSATION: Wife reports that sometimes left hand has "spasms"  MUSCLE LENGTH: Hamstrings: Tightness bilaterally  POSTURE: rounded shoulders, forward head, and flexed trunk    LOWER EXTREMITY ROM:  WFL  LOWER EXTREMITY MMT:  Eval: Right LE strength grossly 4-/5 Left LE strength grossly 4 to 4+/5 throughout  FUNCTIONAL TESTS:  Eval:   5 times sit to stand: 14.96 sec with UE use required Timed up and go (TUG): 13.71 sec without UE device with unsteadiness noted especially with 180 degree turn BERG:  27/56  01/24/2023: 3 minute walk:  378 ft with SPC (pt with shuffling gait pattern)  02/26/2023: Timed up and go (TUG): 11.69 sec 5 times sit to stand: 13.28 sec with UE use required 3 minute walk: 470 ft without assistive device  03/05/2023: BERG: 39/56  GAIT: Distance walked: >200 ft Assistive device utilized: Single point cane Level of assistance: SBA Comments: Pt with unsteady gait noted and requires SBA   TODAY'S TREATMENT:     03/29/23:  Pt arrives for aquatic physical therapy. Treatment took place in 3.5-5.5 feet of water. Water temperature was 91 degrees F. Pt entered the pool via stairs step to step with heavy use of the rails. Pt requires buoyancy of water for support and to offload joints with strengthening exercises.   Seated water bench with 75% submersion Pt performed seated LE AROM exercises with concurrent discussion of current status and pain assessment. 75% depth water walking with yellow noodle 10x each direction, some LOB but pt could self correct. Hip 3 ways 10x Bil with UE assit on side of pool: pt with difficultly coordinating his LE movements today. It did improve with practice.  High knee forward marching 4 lengths of pool holding on the yellow noodle. Single leg knee press with  small noodle RTLE 2x10, requires UE for balance. Attempted underwater bicycle but pt could not find his trunk control to do exercise effectively today.                                                                                                       DATE:  03/26/2023: Nustep level 5 x6 min with PT present to discuss status Seated LAQ and marching with 3# on ankles 2x10 each bilat Standing with 3# on bilat ankles:  heel/toe raises, high marching, hip abduction, hip extension.  2x10 each bilat Sit to/from stand (seated on 2 foam pads) 2x6 with cuing to not use UE Alt  toe tap to 6" step 2x10  bilat Leg Press (seat at 9) 120# 2x10 FWD step ups to 6" step with UE support 2x10 bilat Rocker board DF/PF x2 min   03/19/2023: Nustep level 5 x6 min with PT present to discuss status Seated LAQ with 3# on ankles 2x10 bilat Standing with 3# on bilat ankles:  heel/toe raises, high marching, hip abduction, hip extension.  2x10 each bilat Sit to/from stand (seated on foam pad) 2x10 (with UE use required) Seated hip abduction clamshell with blue tband 2x10 Seated hamstring curl with blue tband 2x10 bilat Rocker board DF/PF x2 min FWD step over 4 small hurdles by barre down and back x3 Lateral step over 4 small hurdles by barre down and back x3 Leg Press (seat at 9) 120# 2x10 Alt toe tap to 6" step 2x10  bilat      PATIENT EDUCATION:  Education details: Issued HEP Person educated: Patient and Spouse Education method: Explanation, Facilities manager, and Handouts Education comprehension: verbalized understanding  HOME EXERCISE PROGRAM: Access Code: [email or web address] URL: [email or web address] Date: 01/10/2023 Prepared by: Clydie Braun Menke  Exercises - Sit to Stand  - 1 x daily - 7 x weekly - 2 sets - 10 reps - Standing Hip Abduction with Counter Support  - 1 x daily - 7 x weekly - 2 sets - 10 reps - Standing Hip Extension with Counter Support  - 1 x daily - 7 x weekly - 2 sets - 10 reps -  Standing March with Counter Support  - 1 x daily - 7 x weekly - 2 sets - 10 reps - Heel Raises with Counter Support  - 1 x daily - 7 x weekly - 2 sets - 10 reps  ASSESSMENT:  CLINICAL IMPRESSION: Pt arrives for aquatic PT reporting only minimal RT knee pain and "some" improvement (he feels) in his balance. Pt had difficulty coordinating some LE movements today. Pt has significant difficulty balancing on his RTLE especially without some level of UE assistance. Pt required intermittent CGA and some min asst of regain his balance today throughout the session.  OBJECTIVE IMPAIRMENTS: decreased balance, difficulty walking, decreased strength, impaired flexibility, postural dysfunction, and pain.   ACTIVITY LIMITATIONS: carrying, lifting, bending, standing, squatting, and stairs  PARTICIPATION LIMITATIONS: cleaning and community activity  PERSONAL FACTORS: Past/current experiences, Time since onset of injury/illness/exacerbation, and 3+ comorbidities: subdural hematoma, HTN, OA  are also affecting patient's functional outcome.   REHAB POTENTIAL: Good  CLINICAL DECISION MAKING: Evolving/moderate complexity  EVALUATION COMPLEXITY: Moderate   GOALS: Goals reviewed with patient? Yes  SHORT TERM GOALS: Target date: 02/01/2023 Pt will be independent with initial HEP. Baseline: Goal status: MET  2.  Pt will improve ambulation safety with least restrictive assistive device to allow him to safely ambulate distances required for granddaughter's wedding on 02/02/2023. Baseline:  Goal status: Met   LONG TERM GOALS: Target date: 05/03/2023 (Goals updated on 03/05/2023)  Pt will be independent with advanced HEP to allow for self progression post discharge. Baseline:  Goal status: Ongoing  2.  Patient will increase Lower Extremity Functional Scale to at least 50% to demonstrate improvement in functional tasks. Baseline: 25% Goal status: Ongoing (see above)  3.  Patient will increase BERG to at  least 42/56 to demonstrate decreased risk of falling. Baseline: 27/56 Goal status: Ongoing (see above)  4.  Pt will report being able to ambulate at least 20 minutes with LRAD without increased pain and no loss of balance to allow for community ambulation.  Baseline:  Goal status: Ongoing  5.  Pt will increase LE strength to at least 4+/5 to allow him to perform sit to stand without UE use and navigate a curb with LRAD safely. Baseline:  Goal status: Ongoing   PLAN:  PT FREQUENCY: 2x/week  PT DURATION: 8 weeks  PLANNED INTERVENTIONS: Therapeutic exercises, Therapeutic activity, Neuromuscular re-education, Balance training, Gait training, Patient/Family education, Self Care, Joint mobilization, Joint manipulation, Stair training, Vestibular training, Canalith repositioning, Aquatic Therapy, Dry Needling, Electrical stimulation, Spinal manipulation, Spinal mobilization, Cryotherapy, Moist heat, Taping, Traction, Ultrasound, Ionotophoresis 4mg /ml Dexamethasone, Manual therapy, and Re-evaluation  PLAN FOR NEXT SESSION: Strengthening, balance, flexibility, focus on proximal hip and core strength, aquatics  Ane Payment, PTA 03/29/23 5:12 PM   [facility] [address] [place], [place] [address] Phone # [phone] Fax [phone]

## 2023-04-02 ENCOUNTER — Encounter: Payer: Self-pay | Admitting: Rehabilitative and Restorative Service Providers"

## 2023-04-02 ENCOUNTER — Ambulatory Visit: Payer: Medicare HMO | Admitting: Rehabilitative and Restorative Service Providers"

## 2023-04-02 DIAGNOSIS — R2681 Unsteadiness on feet: Secondary | ICD-10-CM | POA: Diagnosis not present

## 2023-04-02 DIAGNOSIS — R262 Difficulty in walking, not elsewhere classified: Secondary | ICD-10-CM | POA: Diagnosis not present

## 2023-04-02 DIAGNOSIS — R2689 Other abnormalities of gait and mobility: Secondary | ICD-10-CM

## 2023-04-02 DIAGNOSIS — M6281 Muscle weakness (generalized): Secondary | ICD-10-CM | POA: Diagnosis not present

## 2023-04-02 DIAGNOSIS — R293 Abnormal posture: Secondary | ICD-10-CM

## 2023-04-02 NOTE — Therapy (Signed)
OUTPATIENT PHYSICAL THERAPY TREATMENT NOTE   Patient Name: Brandon [name] MRN: [id number] DOB:22-Sep-1945, 77 y.o., male Today's Date: 04/02/2023  END OF SESSION:  PT End of Session - 04/02/23 0932     Visit Number 15    Date for PT Re-Evaluation 05/03/23    Authorization Type [employer]    Authorization Time Period 03/09/23 - 05/03/2023    Authorization - Visit Number 5    Authorization - Number of Visits 10    Progress Note Due on Visit 18    PT Start Time 0930    PT Stop Time 1010    PT Time Calculation (min) 40 min    Activity Tolerance Patient tolerated treatment well    Behavior During Therapy WFL for tasks assessed/performed               Past Medical History:  Diagnosis Date   Allergy    Basal cell carcinoma    GERD (gastroesophageal reflux disease)    History of kidney cancer    benign; told not cancerous   Hypertension    Past Surgical History:  Procedure Laterality Date   CATARACT EXTRACTION Bilateral    CRANIOTOMY Bilateral 12/25/2021   Procedure: CRANIOTOMY HEMATOMA EVACUATION SUBDURAL;  Surgeon: Julio Sicks, MD;  Location: [facility] OR;  Service: Neurosurgery;  Laterality: Bilateral;   HERNIA REPAIR     x2   KIDNEY SURGERY Right    Had right kidney removed.    TONSILLECTOMY     VASECTOMY     Patient Active Problem List   Diagnosis Date Noted   Cognitive and neurobehavioral dysfunction following brain injury (HCC) 02/07/2022   Gait difficulty 02/07/2022   Traumatic brain injury with loss of consciousness (HCC) 12/30/2021   SDH (subdural hematoma) (HCC) 12/24/2021   Hyperlipidemia 02/13/2018   BPH (benign prostatic hyperplasia) 01/31/2017   Heart murmur 01/20/2016   HTN (hypertension) 03/09/2012    PCP: Shirline Frees, NP  REFERRING PROVIDER: Shirline Frees, NP  REFERRING DIAG: R26.81 (ICD-10-CM) - Gait instability  THERAPY DIAG:  Balance problem  Muscle weakness (generalized)  Unsteadiness on feet  Difficulty in walking, not  elsewhere classified  Abnormal posture  Rationale for Evaluation and Treatment: Rehabilitation  ONSET DATE: Pt and wife report that frequent falls have been occurring for the past year  SUBJECTIVE:   SUBJECTIVE STATEMENT:  Pt reports not feeling his best today, stating that he is having some allergies.  Pt reports similar knee pain to always.  PERTINENT HISTORY: Subdural Hematoma, HTN, GERD, Basal Cell Carcinoma, Hx of benign kidney mass, OA  PAIN:  Are you having pain? Yes: NPRS scale: 0-2/10 Pain location: bilateral knees (right greater than left) Pain description: sore Aggravating factors: unknown Relieving factors: Voltaren gel  PRECAUTIONS: Fall  RED FLAGS: None   WEIGHT BEARING RESTRICTIONS: No  FALLS:  Has patient fallen in last 6 months? Yes. Number of falls 3  LIVING ENVIRONMENT: Lives with: lives with their spouse Lives in: House/apartment Stairs:  one level home, one step to enter porch Has following equipment at home: Single point cane, Environmental consultant - 2 wheeled, Environmental consultant - 4 wheeled, shower chair, and Grab bars  OCCUPATION: retired  PLOF: Requires assistive device for independence and Leisure: walking with wife, going out to eat, movies  PATIENT GOALS: To be able to walk into wedding venue for granddaughter's wedding on September 21st, 2024.  NEXT MD VISIT: Shirline Frees on 07/01/2022  OBJECTIVE:   DIAGNOSTIC FINDINGS: Head CT on 04/27/2023: IMPRESSION: 1. Stable  6 mm acute on chronic left subdural hematoma. No associated midline shift. 2. Stable 6 mm chronic right subdural hematoma. 3. No new intracranial abnormality. 4. No acute displaced fracture or traumatic listhesis of the cervical spine. 5. Persistent right apical subpleural 1.5 cm ground-glass nodular-like airspace opacity (increased in size from 2018). Adenocarcinoma is not excluded. Additional imaging evaluation or consultation with Pulmonology or Thoracic Surgery recommended.  PATIENT  SURVEYS:  Eval:  LEFS 20 / 80 = 25.0 % 03/05/2023:  Lower Extremity Functional Score: 35 / 80 = 43.8 %  COGNITION: Overall cognitive status:  wife reports occasionally pt has word finding problems and some memory impairments      SENSATION: Wife reports that sometimes left hand has "spasms"  MUSCLE LENGTH: Hamstrings: Tightness bilaterally  POSTURE: rounded shoulders, forward head, and flexed trunk    LOWER EXTREMITY ROM:  WFL  LOWER EXTREMITY MMT:  Eval: Right LE strength grossly 4-/5 Left LE strength grossly 4 to 4+/5 throughout  FUNCTIONAL TESTS:  Eval:   5 times sit to stand: 14.96 sec with UE use required Timed up and go (TUG): 13.71 sec without UE device with unsteadiness noted especially with 180 degree turn BERG:  27/56  01/24/2023: 3 minute walk:  378 ft with SPC (pt with shuffling gait pattern)  02/26/2023: Timed up and go (TUG): 11.69 sec 5 times sit to stand: 13.28 sec with UE use required 3 minute walk: 470 ft without assistive device  03/05/2023: BERG: 39/56  04/02/2023: 3 minute walk: 454 ft  GAIT: Distance walked: >200 ft Assistive device utilized: Single point cane Level of assistance: SBA Comments: Pt with unsteady gait noted and requires SBA   TODAY'S TREATMENT:     DATE:  04/02/2023: Nustep level 5 x6 min with PT present to discuss status 3 minute walk: 454 ft Seated LAQ and marching with 3# on ankles 2x10 each bilat Standing with 3# on bilat ankles:  heel/toe raises, high marching, hip abduction, hip extension.  2x10 each bilat Sit to/from stand with 2 foam pads on chair:  2x8 without UE use Alt toe tap to 6" step 2x10  bilat FWD step ups to 6" step with UE support 2x10 bilat Rocker board DF/PF x2 min Seated hip adduction ball squeeze 2x10   03/29/23:   Pt arrives for aquatic physical therapy. Treatment took place in 3.5-5.5 feet of water. Water temperature was 91 degrees F. Pt entered the pool via stairs step to step with heavy  use of the rails. Pt requires buoyancy of water for support and to offload joints with strengthening exercises.   Seated water bench with 75% submersion Pt performed seated LE AROM exercises with concurrent discussion of current status and pain assessment. 75% depth water walking with yellow noodle 10x each direction, some LOB but pt could self correct. Hip 3 ways 10x Bil with UE assit on side of pool: pt with difficultly coordinating his LE movements today. It did improve with practice.  High knee forward marching 4 lengths of pool holding on the yellow noodle. Single leg knee press with small noodle RTLE 2x10, requires UE for balance. Attempted underwater bicycle but pt could not find his trunk control to do exercise effectively today.  DATE:  03/26/2023: Nustep level 5 x6 min with PT present to discuss status Seated LAQ and marching with 3# on ankles 2x10 each bilat Standing with 3# on bilat ankles:  heel/toe raises, high marching, hip abduction, hip extension.  2x10 each bilat Sit to/from stand (seated on 2 foam pads) 2x6 with cuing to not use UE Alt toe tap to 6" step 2x10  bilat Leg Press (seat at 9) 120# 2x10 FWD step ups to 6" step with UE support 2x10 bilat Rocker board DF/PF x2 min     PATIENT EDUCATION:  Education details: Issued HEP Person educated: Patient and Spouse Education method: Explanation, Facilities manager, and Handouts Education comprehension: verbalized understanding  HOME EXERCISE PROGRAM: Access Code: [email or web address] URL: [email or web address] Date: 01/10/2023 Prepared by: Clydie Braun Rayan Dyal  Exercises - Sit to Stand  - 1 x daily - 7 x weekly - 2 sets - 10 reps - Standing Hip Abduction with Counter Support  - 1 x daily - 7 x weekly - 2 sets - 10 reps - Standing Hip Extension with Counter Support  - 1 x daily - 7 x weekly - 2 sets - 10 reps - Standing March  with Counter Support  - 1 x daily - 7 x weekly - 2 sets - 10 reps - Heel Raises with Counter Support  - 1 x daily - 7 x weekly - 2 sets - 10 reps  ASSESSMENT:  CLINICAL IMPRESSION:  Mr Hirschfeld presents to skilled PT with some dyspnea secondary to allergies.  Patient with decreased ambulation distance today in 3 minute walk, possibly secondary to not feeling his best.  Patient overall continues to progress with balance and functional strength.  Secondary to decreased power production and decreased functional weakness, pt continues to require 2 foam cushions for sit to stand without UE support.  Patient continues to require skilled PT to progress towards goal related activities.  OBJECTIVE IMPAIRMENTS: decreased balance, difficulty walking, decreased strength, impaired flexibility, postural dysfunction, and pain.   ACTIVITY LIMITATIONS: carrying, lifting, bending, standing, squatting, and stairs  PARTICIPATION LIMITATIONS: cleaning and community activity  PERSONAL FACTORS: Past/current experiences, Time since onset of injury/illness/exacerbation, and 3+ comorbidities: subdural hematoma, HTN, OA  are also affecting patient's functional outcome.   REHAB POTENTIAL: Good  CLINICAL DECISION MAKING: Evolving/moderate complexity  EVALUATION COMPLEXITY: Moderate   GOALS: Goals reviewed with patient? Yes  SHORT TERM GOALS: Target date: 02/01/2023 Pt will be independent with initial HEP. Baseline: Goal status: MET  2.  Pt will improve ambulation safety with least restrictive assistive device to allow him to safely ambulate distances required for granddaughter's wedding on 02/02/2023. Baseline:  Goal status: Met   LONG TERM GOALS: Target date: 05/03/2023 (Goals updated on 03/05/2023)  Pt will be independent with advanced HEP to allow for self progression post discharge. Baseline:  Goal status: Ongoing  2.  Patient will increase Lower Extremity Functional Scale to at least 50% to demonstrate  improvement in functional tasks. Baseline: 25% Goal status: Ongoing (see above)  3.  Patient will increase BERG to at least 42/56 to demonstrate decreased risk of falling. Baseline: 27/56 Goal status: Ongoing (see above)  4.  Pt will report being able to ambulate at least 20 minutes with LRAD without increased pain and no loss of balance to allow for community ambulation. Baseline:  Goal status: Ongoing  5.  Pt will increase LE strength to at least 4+/5 to allow him to perform sit to stand without UE use and navigate a curb with LRAD  safely. Baseline:  Goal status: Ongoing   PLAN:  PT FREQUENCY: 2x/week  PT DURATION: 8 weeks  PLANNED INTERVENTIONS: Therapeutic exercises, Therapeutic activity, Neuromuscular re-education, Balance training, Gait training, Patient/Family education, Self Care, Joint mobilization, Joint manipulation, Stair training, Vestibular training, Canalith repositioning, Aquatic Therapy, Dry Needling, Electrical stimulation, Spinal manipulation, Spinal mobilization, Cryotherapy, Moist heat, Taping, Traction, Ultrasound, Ionotophoresis 4mg /ml Dexamethasone, Manual therapy, and Re-evaluation  PLAN FOR NEXT SESSION: Strengthening, balance, flexibility, focus on proximal hip and core strength, aquatics    Reather Laurence, PT, DPT 04/02/23, 10:16 AM  [facility] [address] [place], [place] [address] Phone # [phone] Fax [phone]

## 2023-04-05 ENCOUNTER — Ambulatory Visit (HOSPITAL_BASED_OUTPATIENT_CLINIC_OR_DEPARTMENT_OTHER): Payer: Medicare HMO | Attending: Adult Health | Admitting: Physical Therapy

## 2023-04-05 ENCOUNTER — Encounter (HOSPITAL_BASED_OUTPATIENT_CLINIC_OR_DEPARTMENT_OTHER): Payer: Self-pay | Admitting: Physical Therapy

## 2023-04-05 DIAGNOSIS — R2689 Other abnormalities of gait and mobility: Secondary | ICD-10-CM | POA: Diagnosis not present

## 2023-04-05 DIAGNOSIS — R2681 Unsteadiness on feet: Secondary | ICD-10-CM | POA: Diagnosis not present

## 2023-04-05 DIAGNOSIS — M6281 Muscle weakness (generalized): Secondary | ICD-10-CM | POA: Insufficient documentation

## 2023-04-05 DIAGNOSIS — R262 Difficulty in walking, not elsewhere classified: Secondary | ICD-10-CM | POA: Diagnosis not present

## 2023-04-05 DIAGNOSIS — R293 Abnormal posture: Secondary | ICD-10-CM | POA: Insufficient documentation

## 2023-04-05 NOTE — Therapy (Signed)
OUTPATIENT PHYSICAL THERAPY TREATMENT NOTE   Patient Name: Brandon [name] MRN: [id number] DOB:Nov 04, 1945, 77 y.o., male Today's Date: 04/05/2023  END OF SESSION:  PT End of Session - 04/05/23 1710     Visit Number 16    Date for PT Re-Evaluation 05/03/23    Authorization Type [employer]    Authorization Time Period 03/09/23 - 05/03/2023    Authorization - Visit Number 6    Authorization - Number of Visits 10    Progress Note Due on Visit 18    PT Start Time 1345    PT Stop Time 1425    PT Time Calculation (min) 40 min    Activity Tolerance Patient tolerated treatment well    Behavior During Therapy WFL for tasks assessed/performed                Past Medical History:  Diagnosis Date   Allergy    Basal cell carcinoma    GERD (gastroesophageal reflux disease)    History of kidney cancer    benign; told not cancerous   Hypertension    Past Surgical History:  Procedure Laterality Date   CATARACT EXTRACTION Bilateral    CRANIOTOMY Bilateral 12/25/2021   Procedure: CRANIOTOMY HEMATOMA EVACUATION SUBDURAL;  Surgeon: Julio Sicks, MD;  Location: [facility] OR;  Service: Neurosurgery;  Laterality: Bilateral;   HERNIA REPAIR     x2   KIDNEY SURGERY Right    Had right kidney removed.    TONSILLECTOMY     VASECTOMY     Patient Active Problem List   Diagnosis Date Noted   Cognitive and neurobehavioral dysfunction following brain injury (HCC) 02/07/2022   Gait difficulty 02/07/2022   Traumatic brain injury with loss of consciousness (HCC) 12/30/2021   SDH (subdural hematoma) (HCC) 12/24/2021   Hyperlipidemia 02/13/2018   BPH (benign prostatic hyperplasia) 01/31/2017   Heart murmur 01/20/2016   HTN (hypertension) 03/09/2012    PCP: Shirline Frees, NP  REFERRING PROVIDER: Shirline Frees, NP  REFERRING DIAG: R26.81 (ICD-10-CM) - Gait instability  THERAPY DIAG:  No diagnosis found.  Rationale for Evaluation and Treatment: Rehabilitation  ONSET DATE: Pt and wife  report that frequent falls have been occurring for the past year  SUBJECTIVE:   SUBJECTIVE STATEMENT:  Pt reports he is feeling better today.  He reports he feels he is making some progress.   PERTINENT HISTORY: Subdural Hematoma, HTN, GERD, Basal Cell Carcinoma, Hx of benign kidney mass, OA  PAIN:  Are you having pain? Yes: NPRS scale: 2/10 Pain location: bilateral knees (right greater than left) Pain description: sore Aggravating factors: unknown Relieving factors: Voltaren gel  PRECAUTIONS: Fall  RED FLAGS: None   WEIGHT BEARING RESTRICTIONS: No  FALLS:  Has patient fallen in last 6 months? Yes. Number of falls 3  LIVING ENVIRONMENT: Lives with: lives with their spouse Lives in: House/apartment Stairs:  one level home, one step to enter porch Has following equipment at home: Single point cane, Environmental consultant - 2 wheeled, Environmental consultant - 4 wheeled, shower chair, and Grab bars  OCCUPATION: retired  PLOF: Requires assistive device for independence and Leisure: walking with wife, going out to eat, movies  PATIENT GOALS: To be able to walk into wedding venue for granddaughter's wedding on September 21st, 2024.  NEXT MD VISIT: Shirline Frees on 07/01/2022  OBJECTIVE:   DIAGNOSTIC FINDINGS: Head CT on 04/27/2023: IMPRESSION: 1. Stable 6 mm acute on chronic left subdural hematoma. No associated midline shift. 2. Stable 6 mm chronic right subdural hematoma.  3. No new intracranial abnormality. 4. No acute displaced fracture or traumatic listhesis of the cervical spine. 5. Persistent right apical subpleural 1.5 cm ground-glass nodular-like airspace opacity (increased in size from 2018). Adenocarcinoma is not excluded. Additional imaging evaluation or consultation with Pulmonology or Thoracic Surgery recommended.  PATIENT SURVEYS:  Eval:  LEFS 20 / 80 = 25.0 % 03/05/2023:  Lower Extremity Functional Score: 35 / 80 = 43.8 %  COGNITION: Overall cognitive status:  wife reports  occasionally pt has word finding problems and some memory impairments      SENSATION: Wife reports that sometimes left hand has "spasms"  MUSCLE LENGTH: Hamstrings: Tightness bilaterally  POSTURE: rounded shoulders, forward head, and flexed trunk    LOWER EXTREMITY ROM:  WFL  LOWER EXTREMITY MMT:  Eval: Right LE strength grossly 4-/5 Left LE strength grossly 4 to 4+/5 throughout  FUNCTIONAL TESTS:  Eval:   5 times sit to stand: 14.96 sec with UE use required Timed up and go (TUG): 13.71 sec without UE device with unsteadiness noted especially with 180 degree turn BERG:  27/56  01/24/2023: 3 minute walk:  378 ft with SPC (pt with shuffling gait pattern)  02/26/2023: Timed up and go (TUG): 11.69 sec 5 times sit to stand: 13.28 sec with UE use required 3 minute walk: 470 ft without assistive device  03/05/2023: BERG: 39/56  04/02/2023: 3 minute walk: 454 ft  GAIT: Distance walked: >200 ft Assistive device utilized: Single point cane Level of assistance: SBA Comments: Pt with unsteady gait noted and requires SBA   TODAY'S TREATMENT:    DATE:  04/05/2023 Pt seen for aquatic therapy today.  Treatment took place in water 3.5-4.75 ft in depth at the [facility] pool. Temp of water was 91.  Pt entered/exited the pool via stairs independent with bilat rail. * walking forward x 4 laps, backward 1 lap (unsteady when increasing height of steps)  * UE on wall:  hip ext x 10 each  * UE on noodle:  hip abdct/ addct (good balance challenge) x 10 each; high knee forward marching x 2 laps * staggered stance with row motion;  with horiz abdct/ addct with yellow  * Kickboard row in standard stance, then with vectors (good balance challenge * alternating toe taps to 6" step x 10 ( LoB 3x when moving into R stance, corrected with UE on rail) * right/left hamstring stretch with foot on 2nd step x 15s each   Pt requires the buoyancy and hydrostatic pressure of water for  support, and to offload joints by unweighting joint load by at least 50 % in navel deep water and by at least 75-80% in chest to neck deep water.  Viscosity of the water is needed for resistance of strengthening. Water current perturbations provides challenge to standing balance requiring increased core activation.   DATE:  04/02/2023: Nustep level 5 x6 min with PT present to discuss status 3 minute walk: 454 ft Seated LAQ and marching with 3# on ankles 2x10 each bilat Standing with 3# on bilat ankles:  heel/toe raises, high marching, hip abduction, hip extension.  2x10 each bilat Sit to/from stand with 2 foam pads on chair:  2x8 without UE use Alt toe tap to 6" step 2x10  bilat FWD step ups to 6" step with UE support 2x10 bilat Rocker board DF/PF x2 min Seated hip adduction ball squeeze 2x10   03/29/23:   Pt arrives for aquatic physical therapy. Treatment took place in 3.5-5.5 feet of  water. Water temperature was 91 degrees F. Pt entered the pool via stairs step to step with heavy use of the rails. Pt requires buoyancy of water for support and to offload joints with strengthening exercises.   Seated water bench with 75% submersion Pt performed seated LE AROM exercises with concurrent discussion of current status and pain assessment. 75% depth water walking with yellow noodle 10x each direction, some LOB but pt could self correct. Hip 3 ways 10x Bil with UE assit on side of pool: pt with difficultly coordinating his LE movements today. It did improve with practice.  High knee forward marching 4 lengths of pool holding on the yellow noodle. Single leg knee press with small noodle RTLE 2x10, requires UE for balance. Attempted underwater bicycle but pt could not find his trunk control to do exercise effectively today.                                                                                                       DATE:  03/26/2023: Nustep level 5 x6 min with PT present to discuss  status Seated LAQ and marching with 3# on ankles 2x10 each bilat Standing with 3# on bilat ankles:  heel/toe raises, high marching, hip abduction, hip extension.  2x10 each bilat Sit to/from stand (seated on 2 foam pads) 2x6 with cuing to not use UE Alt toe tap to 6" step 2x10  bilat Leg Press (seat at 9) 120# 2x10 FWD step ups to 6" step with UE support 2x10 bilat Rocker board DF/PF x2 min     PATIENT EDUCATION:  Education details: aquatic exercise technique and rationale Person educated: Patient and Spouse Education method: Programmer, multimedia, Facilities manager,  Education comprehension: verbalized understanding  HOME EXERCISE PROGRAM: Access Code: [email or web address] URL: [email or web address] Date: 01/10/2023 Prepared by: Clydie Braun Menke  Exercises - Sit to Stand  - 1 x daily - 7 x weekly - 2 sets - 10 reps - Standing Hip Abduction with Counter Support  - 1 x daily - 7 x weekly - 2 sets - 10 reps - Standing Hip Extension with Counter Support  - 1 x daily - 7 x weekly - 2 sets - 10 reps - Standing March with Counter Support  - 1 x daily - 7 x weekly - 2 sets - 10 reps - Heel Raises with Counter Support  - 1 x daily - 7 x weekly - 2 sets - 10 reps  ASSESSMENT:  CLINICAL IMPRESSION:  Pt reported reduction of knee pain after exercising in water.  Pt demonstrated improved balance with 3 way toe touch; able to complete with UE support on hand floats instead of wall.  R hamstring tight with LAQ and during hamstring stretch. Pt is unable to complete STS from lower seat: requires UE support to complete 2 reps in water.  Pt is more unsteady with LLE in back in staggered stance.  Pt making gradual progress towards remaining goals. Discussed possibility of creating aquatic HEP for pt to utilize at [facility] where they are members (but not currently attending).   Patient continues to require  skilled PT to progress towards goal related activities.  OBJECTIVE IMPAIRMENTS: decreased balance, difficulty  walking, decreased strength, impaired flexibility, postural dysfunction, and pain.   ACTIVITY LIMITATIONS: carrying, lifting, bending, standing, squatting, and stairs  PARTICIPATION LIMITATIONS: cleaning and community activity  PERSONAL FACTORS: Past/current experiences, Time since onset of injury/illness/exacerbation, and 3+ comorbidities: subdural hematoma, HTN, OA  are also affecting patient's functional outcome.   REHAB POTENTIAL: Good  CLINICAL DECISION MAKING: Evolving/moderate complexity  EVALUATION COMPLEXITY: Moderate   GOALS: Goals reviewed with patient? Yes  SHORT TERM GOALS: Target date: 02/01/2023 Pt will be independent with initial HEP. Baseline: Goal status: MET  2.  Pt will improve ambulation safety with least restrictive assistive device to allow him to safely ambulate distances required for granddaughter's wedding on 02/02/2023. Baseline:  Goal status: Met   LONG TERM GOALS: Target date: 05/03/2023 (Goals updated on 03/05/2023)  Pt will be independent with advanced HEP to allow for self progression post discharge. Baseline:  Goal status: Ongoing  2.  Patient will increase Lower Extremity Functional Scale to at least 50% to demonstrate improvement in functional tasks. Baseline: 25% Goal status: Ongoing (see above)  3.  Patient will increase BERG to at least 42/56 to demonstrate decreased risk of falling. Baseline: 27/56 Goal status: Ongoing (see above)  4.  Pt will report being able to ambulate at least 20 minutes with LRAD without increased pain and no loss of balance to allow for community ambulation. Baseline:  Goal status: Ongoing  5.  Pt will increase LE strength to at least 4+/5 to allow him to perform sit to stand without UE use and navigate a curb with LRAD safely. Baseline:  Goal status: Ongoing   PLAN:  PT FREQUENCY: 2x/week  PT DURATION: 8 weeks  PLANNED INTERVENTIONS: Therapeutic exercises, Therapeutic activity, Neuromuscular  re-education, Balance training, Gait training, Patient/Family education, Self Care, Joint mobilization, Joint manipulation, Stair training, Vestibular training, Canalith repositioning, Aquatic Therapy, Dry Needling, Electrical stimulation, Spinal manipulation, Spinal mobilization, Cryotherapy, Moist heat, Taping, Traction, Ultrasound, Ionotophoresis 4mg /ml Dexamethasone, Manual therapy, and Re-evaluation  PLAN FOR NEXT SESSION: Strengthening, balance, flexibility, focus on proximal hip and core strength, aquatics  Mayer Camel, PTA 04/05/23 5:14 PM [facility] [address] [place], [place], [address] Phone: [phone]   Fax:  [phone]

## 2023-04-09 ENCOUNTER — Encounter: Payer: Self-pay | Admitting: Rehabilitative and Restorative Service Providers"

## 2023-04-09 ENCOUNTER — Ambulatory Visit: Payer: Medicare HMO | Admitting: Rehabilitative and Restorative Service Providers"

## 2023-04-09 DIAGNOSIS — M6281 Muscle weakness (generalized): Secondary | ICD-10-CM

## 2023-04-09 DIAGNOSIS — R2689 Other abnormalities of gait and mobility: Secondary | ICD-10-CM | POA: Diagnosis not present

## 2023-04-09 DIAGNOSIS — R293 Abnormal posture: Secondary | ICD-10-CM | POA: Diagnosis not present

## 2023-04-09 DIAGNOSIS — R2681 Unsteadiness on feet: Secondary | ICD-10-CM

## 2023-04-09 DIAGNOSIS — R262 Difficulty in walking, not elsewhere classified: Secondary | ICD-10-CM | POA: Diagnosis not present

## 2023-04-09 NOTE — Therapy (Signed)
OUTPATIENT PHYSICAL THERAPY TREATMENT NOTE   Patient Name: Brandon [name] MRN: [id number] DOB:1945-10-02, 77 y.o., male Today's Date: 04/09/2023   Progress Note Reporting Period 03/05/2023 to 04/09/2023  See note below for Objective Data and Assessment of Progress/Goals.       END OF SESSION:  PT End of Session - 04/09/23 0935     Visit Number 17    Date for PT Re-Evaluation 05/03/23    Authorization Type [employer]    Authorization Time Period 03/09/23 - 05/03/2023    Authorization - Visit Number 7    Authorization - Number of Visits 10    Progress Note Due on Visit 27    PT Start Time 0928    PT Stop Time 1010    PT Time Calculation (min) 42 min    Activity Tolerance Patient tolerated treatment well    Behavior During Therapy WFL for tasks assessed/performed                Past Medical History:  Diagnosis Date   Allergy    Basal cell carcinoma    GERD (gastroesophageal reflux disease)    History of kidney cancer    benign; told not cancerous   Hypertension    Past Surgical History:  Procedure Laterality Date   CATARACT EXTRACTION Bilateral    CRANIOTOMY Bilateral 12/25/2021   Procedure: CRANIOTOMY HEMATOMA EVACUATION SUBDURAL;  Surgeon: Julio Sicks, MD;  Location: [facility] OR;  Service: Neurosurgery;  Laterality: Bilateral;   HERNIA REPAIR     x2   KIDNEY SURGERY Right    Had right kidney removed.    TONSILLECTOMY     VASECTOMY     Patient Active Problem List   Diagnosis Date Noted   Cognitive and neurobehavioral dysfunction following brain injury (HCC) 02/07/2022   Gait difficulty 02/07/2022   Traumatic brain injury with loss of consciousness (HCC) 12/30/2021   SDH (subdural hematoma) (HCC) 12/24/2021   Hyperlipidemia 02/13/2018   BPH (benign prostatic hyperplasia) 01/31/2017   Heart murmur 01/20/2016   HTN (hypertension) 03/09/2012    PCP: Shirline Frees, NP  REFERRING PROVIDER: Shirline Frees, NP  REFERRING DIAG: R26.81 (ICD-10-CM) -  Gait instability  THERAPY DIAG:  Balance problem  Muscle weakness (generalized)  Unsteadiness on feet  Difficulty in walking, not elsewhere classified  Abnormal posture  Rationale for Evaluation and Treatment: Rehabilitation  ONSET DATE: Pt and wife report that frequent falls have been occurring for the past year  SUBJECTIVE:   SUBJECTIVE STATEMENT:  Pt denies any recent falls.  States that his knee is doing better today.  PERTINENT HISTORY: Subdural Hematoma, HTN, GERD, Basal Cell Carcinoma, Hx of benign kidney mass, OA  PAIN:  Are you having pain? Yes: NPRS scale: 0-2/10 Pain location: bilateral knees (right greater than left) Pain description: sore Aggravating factors: unknown Relieving factors: Voltaren gel  PRECAUTIONS: Fall  RED FLAGS: None   WEIGHT BEARING RESTRICTIONS: No  FALLS:  Has patient fallen in last 6 months? Yes. Number of falls 3  LIVING ENVIRONMENT: Lives with: lives with their spouse Lives in: House/apartment Stairs:  one level home, one step to enter porch Has following equipment at home: Single point cane, Environmental consultant - 2 wheeled, Environmental consultant - 4 wheeled, shower chair, and Grab bars  OCCUPATION: retired  PLOF: Requires assistive device for independence and Leisure: walking with wife, going out to eat, movies  PATIENT GOALS: To be able to walk into wedding venue for granddaughter's wedding on September 21st, 2024.  NEXT MD  VISIT: Shirline Frees on 07/01/2022  OBJECTIVE:   DIAGNOSTIC FINDINGS: Head CT on 04/27/2023: IMPRESSION: 1. Stable 6 mm acute on chronic left subdural hematoma. No associated midline shift. 2. Stable 6 mm chronic right subdural hematoma. 3. No new intracranial abnormality. 4. No acute displaced fracture or traumatic listhesis of the cervical spine. 5. Persistent right apical subpleural 1.5 cm ground-glass nodular-like airspace opacity (increased in size from 2018). Adenocarcinoma is not excluded. Additional imaging  evaluation or consultation with Pulmonology or Thoracic Surgery recommended.  PATIENT SURVEYS:  Eval:  LEFS 20 / 80 = 25.0 % 03/05/2023:  Lower Extremity Functional Score: 35 / 80 = 43.8 %  COGNITION: Overall cognitive status:  wife reports occasionally pt has word finding problems and some memory impairments      SENSATION: Wife reports that sometimes left hand has "spasms"  MUSCLE LENGTH: Hamstrings: Tightness bilaterally  POSTURE: rounded shoulders, forward head, and flexed trunk    LOWER EXTREMITY ROM:  WFL  LOWER EXTREMITY MMT:  Eval: Right LE strength grossly 4-/5 Left LE strength grossly 4 to 4+/5 throughout  FUNCTIONAL TESTS:  Eval:   5 times sit to stand: 14.96 sec with UE use required Timed up and go (TUG): 13.71 sec without UE device with unsteadiness noted especially with 180 degree turn BERG:  27/56  01/24/2023: 3 minute walk:  378 ft with SPC (pt with shuffling gait pattern)  02/26/2023: Timed up and go (TUG): 11.69 sec 5 times sit to stand: 13.28 sec with UE use required 3 minute walk: 470 ft without assistive device  03/05/2023: BERG: 39/56  04/02/2023: 3 minute walk: 454 ft  04/09/2023: Timed up and go (TUG): 10.16 sec 5 times sit to stand: 15.81 sec first time, 13.94 sec second time 3 minute walk: 499 ft  GAIT: Distance walked: >200 ft Assistive device utilized: Single point cane Level of assistance: SBA Comments: Pt with unsteady gait noted and requires SBA   TODAY'S TREATMENT:    DATE:  04/09/2023 Nustep level 5 x6 min with PT present to discuss status TUG  Sit to/from stand 2x5 3 minute walk: 499 ft Seated LAQ and marching with 3# on ankles 2x10 each bilat Standing with 3# on bilat ankles: hip abduction, hip extension.  2x10 each bilat Leg Press (seat at 9) 120# 2x10 Backwards resisted ambulation with 15# cable pulley 2x5 Alt toe tap to 6" step 2x10  bilat   04/05/2023 Pt seen for aquatic therapy today.  Treatment took  place in water 3.5-4.75 ft in depth at the [facility] pool. Temp of water was 91.  Pt entered/exited the pool via stairs independent with bilat rail. * walking forward x 4 laps, backward 1 lap (unsteady when increasing height of steps)  * UE on wall:  hip ext x 10 each  * UE on noodle:  hip abdct/ addct (good balance challenge) x 10 each; high knee forward marching x 2 laps * staggered stance with row motion;  with horiz abdct/ addct with yellow  * Kickboard row in standard stance, then with vectors (good balance challenge * alternating toe taps to 6" step x 10 ( LoB 3x when moving into R stance, corrected with UE on rail) * right/left hamstring stretch with foot on 2nd step x 15s each   Pt requires the buoyancy and hydrostatic pressure of water for support, and to offload joints by unweighting joint load by at least 50 % in navel deep water and by at least 75-80% in chest to neck  deep water.  Viscosity of the water is needed for resistance of strengthening. Water current perturbations provides challenge to standing balance requiring increased core activation.   DATE:  04/02/2023: Nustep level 5 x6 min with PT present to discuss status 3 minute walk: 454 ft Seated LAQ and marching with 3# on ankles 2x10 each bilat Standing with 3# on bilat ankles:  heel/toe raises, high marching, hip abduction, hip extension.  2x10 each bilat Sit to/from stand with 2 foam pads on chair:  2x8 without UE use Alt toe tap to 6" step 2x10  bilat FWD step ups to 6" step with UE support 2x10 bilat Rocker board DF/PF x2 min Seated hip adduction ball squeeze 2x10     PATIENT EDUCATION:  Education details: aquatic exercise technique and rationale Person educated: Patient and Spouse Education method: Programmer, multimedia, Facilities manager,  Education comprehension: verbalized understanding  HOME EXERCISE PROGRAM: Access Code: [email or web address] URL: [email or web address] Date: 01/10/2023 Prepared by:  Clydie Braun Bodee Lafoe  Exercises - Sit to Stand  - 1 x daily - 7 x weekly - 2 sets - 10 reps - Standing Hip Abduction with Counter Support  - 1 x daily - 7 x weekly - 2 sets - 10 reps - Standing Hip Extension with Counter Support  - 1 x daily - 7 x weekly - 2 sets - 10 reps - Standing March with Counter Support  - 1 x daily - 7 x weekly - 2 sets - 10 reps - Heel Raises with Counter Support  - 1 x daily - 7 x weekly - 2 sets - 10 reps  ASSESSMENT:  CLINICAL IMPRESSION:  Mr Eckrich presents to skilled PT reporting that he is feeling a little tired today, but his knee pain is feeling better.  Patient able to participate in a 10th visit reassessment today and with noted increased ambulation distance in 3 minute walk and slight improvement with time on TUG.  Patient able to progress with new resisted retrogait exercise with CGA for increased safety to prevent a loss of balance.  Patient is progressing towards goal related activities and would benefit from continued skilled PT to progress towards goal related activities.   OBJECTIVE IMPAIRMENTS: decreased balance, difficulty walking, decreased strength, impaired flexibility, postural dysfunction, and pain.   ACTIVITY LIMITATIONS: carrying, lifting, bending, standing, squatting, and stairs  PARTICIPATION LIMITATIONS: cleaning and community activity  PERSONAL FACTORS: Past/current experiences, Time since onset of injury/illness/exacerbation, and 3+ comorbidities: subdural hematoma, HTN, OA  are also affecting patient's functional outcome.   REHAB POTENTIAL: Good  CLINICAL DECISION MAKING: Evolving/moderate complexity  EVALUATION COMPLEXITY: Moderate   GOALS: Goals reviewed with patient? Yes  SHORT TERM GOALS: Target date: 02/01/2023 Pt will be independent with initial HEP. Baseline: Goal status: MET  2.  Pt will improve ambulation safety with least restrictive assistive device to allow him to safely ambulate distances required for granddaughter's  wedding on 02/02/2023. Baseline:  Goal status: Met   LONG TERM GOALS: Target date: 05/03/2023 (Goals updated on 03/05/2023)  Pt will be independent with advanced HEP to allow for self progression post discharge. Baseline:  Goal status: Ongoing  2.  Patient will increase Lower Extremity Functional Scale to at least 50% to demonstrate improvement in functional tasks. Baseline: 25% Goal status: Ongoing (see above)  3.  Patient will increase BERG to at least 42/56 to demonstrate decreased risk of falling. Baseline: 27/56 Goal status: Ongoing (see above)  4.  Pt will report being able to ambulate at least 20 minutes with LRAD  without increased pain and no loss of balance to allow for community ambulation. Baseline:  Goal status: Ongoing  5.  Pt will increase LE strength to at least 4+/5 to allow him to perform sit to stand without UE use and navigate a curb with LRAD safely. Baseline:  Goal status: Ongoing   PLAN:  PT FREQUENCY: 2x/week  PT DURATION: 8 weeks  PLANNED INTERVENTIONS: Therapeutic exercises, Therapeutic activity, Neuromuscular re-education, Balance training, Gait training, Patient/Family education, Self Care, Joint mobilization, Joint manipulation, Stair training, Vestibular training, Canalith repositioning, Aquatic Therapy, Dry Needling, Electrical stimulation, Spinal manipulation, Spinal mobilization, Cryotherapy, Moist heat, Taping, Traction, Ultrasound, Ionotophoresis 4mg /ml Dexamethasone, Manual therapy, and Re-evaluation  PLAN FOR NEXT SESSION: Strengthening, balance, flexibility, focus on proximal hip and core strength, aquatics    Reather Laurence, PT, DPT 04/09/23, 10:16 AM  [facility] [address] [place], [place] [address] Phone # [phone] Fax [phone]

## 2023-04-10 ENCOUNTER — Ambulatory Visit: Payer: Medicare HMO

## 2023-04-10 VITALS — BP 122/64 | HR 66 | Temp 98.2°F | Ht 78.0 in | Wt 246.7 lb

## 2023-04-10 DIAGNOSIS — Z0000 Encounter for general adult medical examination without abnormal findings: Secondary | ICD-10-CM

## 2023-04-10 NOTE — Patient Instructions (Addendum)
Mr. Brandon [name] , Thank you for taking time to come for your Medicare Wellness Visit. I appreciate your ongoing commitment to your health goals. Please review the following plan we discussed and let me know if I can assist you in the future.   Referrals/Orders/Follow-Ups/Clinician Recommendations:   This is a list of the screening recommended for you and due dates:  Health Maintenance  Topic Date Due   COVID-19 Vaccine (6 - 2023-24 season) 03/14/2023   Yearly kidney function blood test for diabetes  12/26/2023   Yearly kidney health urinalysis for diabetes  12/26/2023   Medicare Annual Wellness Visit  04/09/2024   DTaP/Tdap/Td vaccine (3 - Td or Tdap) 12/25/2031   Pneumonia Vaccine  Completed   Flu Shot  Completed   Hepatitis C Screening  Completed   Zoster (Shingles) Vaccine  Completed   HPV Vaccine  Aged Out   Colon Cancer Screening  Discontinued    Advanced directives: (Provided) Advance directive discussed with you today. I have provided a copy for you to complete at home and have notarized. Once this is complete, please bring a copy in to our office so we can scan it into your chart.   Next Medicare Annual Wellness Visit scheduled for next year: Yes

## 2023-04-10 NOTE — Progress Notes (Signed)
Subjective:   Brandon [name] is a 77 y.o. male who presents for Medicare Annual/Subsequent preventive examination.  Visit Complete: In person    Cardiac Risk Factors include: advanced age (>25men, >24 women);male gender;hypertension     Objective:    Today's Vitals   04/10/23 0841  BP: 122/64  Pulse: 66  Temp: 98.2 F (36.8 C)  TempSrc: Oral  SpO2: 99%  Weight: 246 lb 11.2 oz (111.9 kg)  Height: 6\' 6"  (1.981 m)   Body mass index is 28.51 kg/m.     04/10/2023    9:04 AM 01/10/2023    9:06 AM 04/26/2022    7:43 AM 12/30/2021    1:22 PM 11/02/2021    1:40 PM 07/14/2020   12:36 PM 01/30/2016    1:10 PM  Advanced Directives  Does Patient Have a Medical Advance Directive? No No No No No No No  Would patient like information on creating a medical advance directive? No - Patient declined No - Patient declined  No - Patient declined No - Patient declined No - Patient declined No - patient declined information    Current Medications (verified) Outpatient Encounter Medications as of 04/10/2023  Medication Sig   acetaminophen (TYLENOL) 500 MG tablet Take 1 tablet (500 mg total) by mouth 2 (two) times daily as needed for mild pain.   atorvastatin (LIPITOR) 20 MG tablet TAKE 1 TABLET BY MOUTH EVERY DAY   B Complex Vitamins (VITAMIN B COMPLEX) TABS Take by mouth daily.   butalbital-acetaminophen-caffeine (FIORICET) 50-325-40 MG tablet Take 1 tablet by mouth every 6 (six) hours as needed for headache.   cetirizine (ZYRTEC) 10 MG tablet Take 10 mg by mouth daily.   Cholecalciferol 25 MCG (1000 UT) tablet Take 1 tablet (1,000 Units total) by mouth daily.   diclofenac Sodium (VOLTAREN) 1 % GEL Apply 4 g topically 3 (three) times daily.   finasteride (PROSCAR) 5 MG tablet Take 1 tablet (5 mg total) by mouth daily.   latanoprost (XALATAN) 0.005 % ophthalmic solution Place 1 drop into both eyes at bedtime.   lisinopril-hydrochlorothiazide (ZESTORETIC) 20-25 MG tablet TAKE 1 TABLET BY  MOUTH EVERY DAY   tamsulosin (FLOMAX) 0.4 MG CAPS capsule TAKE 1 CAPSULE BY MOUTH 2 TIMES DAILY.   traZODone (DESYREL) 100 MG tablet Take 1 tablet (100 mg total) by mouth at bedtime.   vitamin C (ASCORBIC ACID) 500 MG tablet Take 500 mg by mouth daily.   No facility-administered encounter medications on file as of 04/10/2023.    Allergies (verified) Patient has no known allergies.   History: Past Medical History:  Diagnosis Date   Allergy    Basal cell carcinoma    GERD (gastroesophageal reflux disease)    History of kidney cancer    benign; told not cancerous   Hypertension    Past Surgical History:  Procedure Laterality Date   CATARACT EXTRACTION Bilateral    CRANIOTOMY Bilateral 12/25/2021   Procedure: CRANIOTOMY HEMATOMA EVACUATION SUBDURAL;  Surgeon: Julio Sicks, MD;  Location: [facility] OR;  Service: Neurosurgery;  Laterality: Bilateral;   HERNIA REPAIR     x2   KIDNEY SURGERY Right    Had right kidney removed.    TONSILLECTOMY     VASECTOMY     Family History  Problem Relation Age of Onset   Cancer Mother        Breast    Alzheimer's disease Mother    Cancer Father        Lung  Colon cancer Neg Hx    Social History   Socioeconomic History   Marital status: Married    Spouse name: Not on file   Number of children: Not on file   Years of education: Not on file   Highest education level: Bachelor's degree (e.g., BA, AB, BS)  Occupational History   Not on file  Tobacco Use   Smoking status: Former   Smokeless tobacco: Never  Vaping Use   Vaping status: Never Used  Substance and Sexual Activity   Alcohol use: Not Currently    Comment: "Beer every once in a while"   Drug use: No   Sexual activity: Not Currently  Other Topics Concern   Not on file  Social History Narrative   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   Married    Three children ([place] in [place]    Right handed   Social Determinants of Health   Financial Resource Strain: Low Risk   (04/10/2023)   Overall Financial Resource Strain (CARDIA)    Difficulty of Paying Living Expenses: Not hard at all  Food Insecurity: No Food Insecurity (04/10/2023)   Hunger Vital Sign    Worried About Running Out of Food in the Last Year: Never true    Ran Out of Food in the Last Year: Never true  Transportation Needs: No Transportation Needs (04/10/2023)   PRAPARE - Administrator, Civil Service (Medical): No    Lack of Transportation (Non-Medical): No  Physical Activity: Sufficiently Active (04/10/2023)   Exercise Vital Sign    Days of Exercise per Week: 7 days    Minutes of Exercise per Session: 40 min  Stress: No Stress Concern Present (04/10/2023)   [employer] of Occupational Health - Occupational Stress Questionnaire    Feeling of Stress : Not at all  Social Connections: Socially Integrated (04/10/2023)   Social Connection and Isolation Panel [NHANES]    Frequency of Communication with Friends and Family: More than three times a week    Frequency of Social Gatherings with Friends and Family: More than three times a week    Attends Religious Services: More than 4 times per year    Active Member of [employer] or Organizations: Yes    Attends Engineer, structural: More than 4 times per year    Marital Status: Married    Tobacco Counseling Counseling given: Not Answered   Clinical Intake:  Pre-visit preparation completed: Yes  Pain : No/denies pain     BMI - recorded: 28.51 Nutritional Status: BMI 25 -29 Overweight Nutritional Risks: None Diabetes: No  How often do you need to have someone help you when you read instructions, pamphlets, or other written materials from your doctor or pharmacy?: 1 - Never  Interpreter Needed?: No  Information entered by :: Theresa Mulligan LPN   Activities of Daily Living    04/10/2023    9:00 AM 12/26/2022    7:54 AM  In your present state of health, do you have any difficulty performing the following  activities:  Hearing? 1 1  Vision? 0 0  Difficulty concentrating or making decisions? 0 0  Walking or climbing stairs? 1 1  Comment Uses a Cane, Walker and has wheelchair   Dressing or bathing? 0 1  Doing errands, shopping? 0 1  Preparing Food and eating ? N   Using the Toilet? N   In the past six months, have you accidently leaked urine? N  Do you have problems with loss of bowel control? N   Managing your Medications? N   Managing your Finances? N   Housekeeping or managing your Housekeeping? N     Patient Care Team: Shirline Frees, NP as PCP - General (Family Medicine) Berneice Heinrich Delbert Phenix., MD as Consulting Physician (Urology)  Indicate any recent Medical Services you may have received from other than [facility] providers in the past year (date may be approximate).     Assessment:   This is a routine wellness examination for [facility].  Hearing/Vision screen Hearing Screening - Comments:: Wears hearing aids Vision Screening - Comments:: Wears reading glasses - up to date with routine eye exams with  [facility]   Goals Addressed               This Visit's Progress     Continue to exercise (pt-stated)         Depression Screen    04/10/2023    8:59 AM 12/26/2022    7:54 AM 02/07/2022    1:35 PM 11/02/2021    1:30 PM 10/18/2021    8:57 AM 06/30/2020    9:45 AM 05/28/2019    7:31 AM  PHQ 2/9 Scores  PHQ - 2 Score 0 0 0 0 0 0 0  PHQ- 9 Score  0 1 0 0      Fall Risk    04/10/2023    9:02 AM 12/26/2022    7:54 AM 03/14/2022   10:57 AM 02/07/2022    1:35 PM 11/23/2021   10:26 AM  Fall Risk   Falls in the past year? 1 1 1 1 1   Number falls in past yr: 0 1 1 1 1   Injury with Fall? 1 1 1 1 1   Comment Fx: 3 Ribs. Followed by medical attention      Risk for fall due to : History of fall(s) History of fall(s)     Follow up Falls prevention discussed Falls evaluation completed Falls evaluation completed      MEDICARE RISK AT HOME: Medicare Risk at Home Any stairs  in or around the home?: No If so, are there any without handrails?: No Home free of loose throw rugs in walkways, pet beds, electrical cords, etc?: Yes Adequate lighting in your home to reduce risk of falls?: Yes Life alert?: No Use of a cane, walker or w/c?: Yes Grab bars in the bathroom?: Yes Shower chair or bench in shower?: Yes Elevated toilet seat or a handicapped toilet?: Yes  TIMED UP AND GO:  Was the test performed?  Yes  Length of time to ambulate 10 feet: 10 sec Gait slow and steady with assistive device    Cognitive Function:        04/10/2023    9:05 AM 11/02/2021    1:41 PM  6CIT Screen  What Year? 0 points 0 points  What month? 0 points 0 points  What time? 0 points 0 points  Count back from 20 0 points 0 points  Months in reverse 0 points 0 points  Repeat phrase 0 points 0 points  Total Score 0 points 0 points    Immunizations Immunization History  Administered Date(s) Administered   Influenza, High Dose Seasonal PF 01/20/2016, 01/31/2017, 02/13/2018, 02/10/2019, 01/17/2023   Influenza-Unspecified 02/11/2019, 03/04/2020, 02/16/2022   Moderna Covid-19 Fall Seasonal Vaccine 62yrs & older 01/17/2023   PFIZER(Purple Top)SARS-COV-2 Vaccination 06/03/2019, 06/26/2019, 03/05/2020   Pneumococcal Conjugate-13 01/20/2016   Pneumococcal Polysaccharide-23 10/24/2012  Respiratory Syncytial Virus Vaccine,Recomb Aduvanted(Arexvy) 04/12/2022   Tdap 03/09/2012, 12/24/2021   Unspecified SARS-COV-2 Vaccination 02/16/2022   Zoster Recombinant(Shingrix) 02/10/2019, 08/01/2022    TDAP status: Up to date  Flu Vaccine status: Up to date  Pneumococcal vaccine status: Up to date  Covid-19 vaccine status: Declined, Education has been provided regarding the importance of this vaccine but patient still declined. Advised may receive this vaccine at local pharmacy or Health Dept.or vaccine clinic. Aware to provide a copy of the vaccination record if obtained from local pharmacy  or Health Dept. Verbalized acceptance and understanding.  Qualifies for Shingles Vaccine? Yes   Zostavax completed Yes   Shingrix Completed?: Yes  Screening Tests Health Maintenance  Topic Date Due   COVID-19 Vaccine (6 - 2023-24 season) 03/14/2023   Diabetic kidney evaluation - eGFR measurement  12/26/2023   Diabetic kidney evaluation - Urine ACR  12/26/2023   Medicare Annual Wellness (AWV)  04/09/2024   DTaP/Tdap/Td (3 - Td or Tdap) 12/25/2031   Pneumonia Vaccine 39+ Years old  Completed   INFLUENZA VACCINE  Completed   Hepatitis C Screening  Completed   Zoster Vaccines- Shingrix  Completed   HPV VACCINES  Aged Out   Colonoscopy  Discontinued    Health Maintenance  Health Maintenance Due  Topic Date Due   COVID-19 Vaccine (6 - 2023-24 season) 03/14/2023        Additional Screening:  Hepatitis C Screening: does qualify; Completed   Vision Screening: Recommended annual ophthalmology exams for early detection of glaucoma and other disorders of the eye. Is the patient up to date with their annual eye exam?  Yes  Who is the provider or what is the name of the office in which the patient attends annual eye exams? [facility] If pt is not established with a provider, would they like to be referred to a provider to establish care? No .   Dental Screening: Recommended annual dental exams for proper oral hygiene    Community Resource Referral / Chronic Care Management:  CRR required this visit?  No   CCM required this visit?  No     Plan:     I have personally reviewed and noted the following in the patient's chart:   Medical and social history Use of alcohol, tobacco or illicit drugs  Current medications and supplements including opioid prescriptions. Patient is not currently taking opioid prescriptions. Functional ability and status Nutritional status Physical activity Advanced directives List of other physicians Hospitalizations, surgeries, and ER  visits in previous 12 months Vitals Screenings to include cognitive, depression, and falls Referrals and appointments  In addition, I have reviewed and discussed with patient certain preventive protocols, quality metrics, and best practice recommendations. A written personalized care plan for preventive services as well as general preventive health recommendations were provided to patient.     Tillie Rung, LPN   [id number]/5621   After Visit Summary: Given  Nurse Notes: None

## 2023-04-16 ENCOUNTER — Encounter: Payer: Self-pay | Admitting: Rehabilitative and Restorative Service Providers"

## 2023-04-16 ENCOUNTER — Ambulatory Visit: Payer: Medicare HMO | Attending: Adult Health | Admitting: Rehabilitative and Restorative Service Providers"

## 2023-04-16 DIAGNOSIS — R2681 Unsteadiness on feet: Secondary | ICD-10-CM | POA: Insufficient documentation

## 2023-04-16 DIAGNOSIS — R262 Difficulty in walking, not elsewhere classified: Secondary | ICD-10-CM | POA: Insufficient documentation

## 2023-04-16 DIAGNOSIS — R293 Abnormal posture: Secondary | ICD-10-CM | POA: Diagnosis not present

## 2023-04-16 DIAGNOSIS — R2689 Other abnormalities of gait and mobility: Secondary | ICD-10-CM | POA: Diagnosis not present

## 2023-04-16 DIAGNOSIS — M6281 Muscle weakness (generalized): Secondary | ICD-10-CM | POA: Insufficient documentation

## 2023-04-16 NOTE — Therapy (Signed)
OUTPATIENT PHYSICAL THERAPY TREATMENT NOTE   Patient Name: Brandon [name] MRN: [id number] DOB:20-Jun-1945, 77 y.o., male Today's Date: 04/16/2023    END OF SESSION:  PT End of Session - 04/16/23 0935     Visit Number 18    Date for PT Re-Evaluation 05/03/23    Authorization Type [employer]    Authorization Time Period 03/09/23 - 05/03/2023    Authorization - Visit Number 8    Authorization - Number of Visits 10    Progress Note Due on Visit 27    PT Start Time 0930    PT Stop Time 1010    PT Time Calculation (min) 40 min    Activity Tolerance Patient tolerated treatment well    Behavior During Therapy WFL for tasks assessed/performed                Past Medical History:  Diagnosis Date   Allergy    Basal cell carcinoma    GERD (gastroesophageal reflux disease)    History of kidney cancer    benign; told not cancerous   Hypertension    Past Surgical History:  Procedure Laterality Date   CATARACT EXTRACTION Bilateral    CRANIOTOMY Bilateral 12/25/2021   Procedure: CRANIOTOMY HEMATOMA EVACUATION SUBDURAL;  Surgeon: Julio Sicks, MD;  Location: [facility] OR;  Service: Neurosurgery;  Laterality: Bilateral;   HERNIA REPAIR     x2   KIDNEY SURGERY Right    Had right kidney removed.    TONSILLECTOMY     VASECTOMY     Patient Active Problem List   Diagnosis Date Noted   Cognitive and neurobehavioral dysfunction following brain injury (HCC) 02/07/2022   Gait difficulty 02/07/2022   Traumatic brain injury with loss of consciousness (HCC) 12/30/2021   SDH (subdural hematoma) (HCC) 12/24/2021   Hyperlipidemia 02/13/2018   BPH (benign prostatic hyperplasia) 01/31/2017   Heart murmur 01/20/2016   HTN (hypertension) 03/09/2012    PCP: Shirline Frees, NP  REFERRING PROVIDER: Shirline Frees, NP  REFERRING DIAG: R26.81 (ICD-10-CM) - Gait instability  THERAPY DIAG:  Balance problem  Muscle weakness (generalized)  Unsteadiness on feet  Difficulty in walking, not  elsewhere classified  Abnormal posture  Rationale for Evaluation and Treatment: Rehabilitation  ONSET DATE: Pt and wife report that frequent falls have been occurring for the past year  SUBJECTIVE:   SUBJECTIVE STATEMENT:  Pt with no new complaints today.  Pt denies falls.  PERTINENT HISTORY: Subdural Hematoma, HTN, GERD, Basal Cell Carcinoma, Hx of benign kidney mass, OA  PAIN:  Are you having pain? Yes: NPRS scale: 3/10 Pain location: bilateral knees (right greater than left) Pain description: sore Aggravating factors: unknown Relieving factors: Voltaren gel  PRECAUTIONS: Fall  RED FLAGS: None   WEIGHT BEARING RESTRICTIONS: No  FALLS:  Has patient fallen in last 6 months? Yes. Number of falls 3  LIVING ENVIRONMENT: Lives with: lives with their spouse Lives in: House/apartment Stairs:  one level home, one step to enter porch Has following equipment at home: Single point cane, Environmental consultant - 2 wheeled, Environmental consultant - 4 wheeled, shower chair, and Grab bars  OCCUPATION: retired  PLOF: Requires assistive device for independence and Leisure: walking with wife, going out to eat, movies  PATIENT GOALS: To be able to walk into wedding venue for granddaughter's wedding on September 21st, 2024.  NEXT MD VISIT: Shirline Frees on 07/01/2022  OBJECTIVE:   DIAGNOSTIC FINDINGS: Head CT on 04/27/2023: IMPRESSION: 1. Stable 6 mm acute on chronic left subdural hematoma. No  associated midline shift. 2. Stable 6 mm chronic right subdural hematoma. 3. No new intracranial abnormality. 4. No acute displaced fracture or traumatic listhesis of the cervical spine. 5. Persistent right apical subpleural 1.5 cm ground-glass nodular-like airspace opacity (increased in size from 2018). Adenocarcinoma is not excluded. Additional imaging evaluation or consultation with Pulmonology or Thoracic Surgery recommended.  PATIENT SURVEYS:  Eval:  LEFS 20 / 80 = 25.0 % 03/05/2023:  Lower Extremity  Functional Score: 35 / 80 = 43.8 %  COGNITION: Overall cognitive status:  wife reports occasionally pt has word finding problems and some memory impairments      SENSATION: Wife reports that sometimes left hand has "spasms"  MUSCLE LENGTH: Hamstrings: Tightness bilaterally  POSTURE: rounded shoulders, forward head, and flexed trunk    LOWER EXTREMITY ROM:  WFL  LOWER EXTREMITY MMT:  Eval: Right LE strength grossly 4-/5 Left LE strength grossly 4 to 4+/5 throughout  FUNCTIONAL TESTS:  Eval:   5 times sit to stand: 14.96 sec with UE use required Timed up and go (TUG): 13.71 sec without UE device with unsteadiness noted especially with 180 degree turn BERG:  27/56  01/24/2023: 3 minute walk:  378 ft with SPC (pt with shuffling gait pattern)  02/26/2023: Timed up and go (TUG): 11.69 sec 5 times sit to stand: 13.28 sec with UE use required 3 minute walk: 470 ft without assistive device  03/05/2023: BERG: 39/56  04/02/2023: 3 minute walk: 454 ft  04/09/2023: Timed up and go (TUG): 10.16 sec 5 times sit to stand: 15.81 sec first time, 13.94 sec second time 3 minute walk: 499 ft  GAIT: Distance walked: >200 ft Assistive device utilized: Single point cane Level of assistance: SBA Comments: Pt with unsteady gait noted and requires SBA   TODAY'S TREATMENT:     DATE:  04/16/2023 Nustep level 5 x6 min with PT present to discuss status Sit to/from stand on chair with foam pad to elevate surface 2x5 without UE support Seated LAQ and marching with 3# on ankles 2x10 each bilat Ambulation to cancer gym and back with 3# on bilat ankles x2 with seated recovery period between the laps Standing with 3# on bilat ankles: hip abduction, hip extension.  2x10 each bilat Marching on trampoline with UE assist x2 min Leg Press (seat at 9) 120# 2x10 Backwards resisted ambulation with 15# cable pulley x5   04/09/2023 Nustep level 5 x6 min with PT present to discuss status TUG   Sit to/from stand 2x5 3 minute walk: 499 ft Seated LAQ and marching with 3# on ankles 2x10 each bilat Standing with 3# on bilat ankles: hip abduction, hip extension.  2x10 each bilat Leg Press (seat at 9) 120# 2x10 Backwards resisted ambulation with 15# cable pulley 2x5 Alt toe tap to 6" step 2x10  bilat   04/05/2023 Pt seen for aquatic therapy today.  Treatment took place in water 3.5-4.75 ft in depth at the [facility] pool. Temp of water was 91.  Pt entered/exited the pool via stairs independent with bilat rail. * walking forward x 4 laps, backward 1 lap (unsteady when increasing height of steps)  * UE on wall:  hip ext x 10 each  * UE on noodle:  hip abdct/ addct (good balance challenge) x 10 each; high knee forward marching x 2 laps * staggered stance with row motion;  with horiz abdct/ addct with yellow  * Kickboard row in standard stance, then with vectors (good balance challenge * alternating toe  taps to 6" step x 10 ( LoB 3x when moving into R stance, corrected with UE on rail) * right/left hamstring stretch with foot on 2nd step x 15s each   Pt requires the buoyancy and hydrostatic pressure of water for support, and to offload joints by unweighting joint load by at least 50 % in navel deep water and by at least 75-80% in chest to neck deep water.  Viscosity of the water is needed for resistance of strengthening. Water current perturbations provides challenge to standing balance requiring increased core activation.     PATIENT EDUCATION:  Education details: aquatic exercise technique and rationale Person educated: Patient and Spouse Education method: Explanation, Facilities manager,  Education comprehension: verbalized understanding  HOME EXERCISE PROGRAM: Access Code: [email or web address] URL: [email or web address] Date: 01/10/2023 Prepared by: Clydie Braun Raphael Espe  Exercises - Sit to Stand  - 1 x daily - 7 x weekly - 2 sets - 10 reps - Standing Hip Abduction with  Counter Support  - 1 x daily - 7 x weekly - 2 sets - 10 reps - Standing Hip Extension with Counter Support  - 1 x daily - 7 x weekly - 2 sets - 10 reps - Standing March with Counter Support  - 1 x daily - 7 x weekly - 2 sets - 10 reps - Heel Raises with Counter Support  - 1 x daily - 7 x weekly - 2 sets - 10 reps  ASSESSMENT:  CLINICAL IMPRESSION:  Mr Finseth presents to skilled PT with no new complaints and no new falls reported.  Patient continues with progress towards goals with increased strengthening.  Patient able to now perform sit to stand without UE use from only one foam pad today, instead of 2.  Patient able to perform marching today on trampoline with UE support to progress balance on non-compliant surface.   OBJECTIVE IMPAIRMENTS: decreased balance, difficulty walking, decreased strength, impaired flexibility, postural dysfunction, and pain.   ACTIVITY LIMITATIONS: carrying, lifting, bending, standing, squatting, and stairs  PARTICIPATION LIMITATIONS: cleaning and community activity  PERSONAL FACTORS: Past/current experiences, Time since onset of injury/illness/exacerbation, and 3+ comorbidities: subdural hematoma, HTN, OA  are also affecting patient's functional outcome.   REHAB POTENTIAL: Good  CLINICAL DECISION MAKING: Evolving/moderate complexity  EVALUATION COMPLEXITY: Moderate   GOALS: Goals reviewed with patient? Yes  SHORT TERM GOALS: Target date: 02/01/2023 Pt will be independent with initial HEP. Baseline: Goal status: MET  2.  Pt will improve ambulation safety with least restrictive assistive device to allow him to safely ambulate distances required for granddaughter's wedding on 02/02/2023. Baseline:  Goal status: Met   LONG TERM GOALS: Target date: 05/03/2023 (Goals updated on 03/05/2023)  Pt will be independent with advanced HEP to allow for self progression post discharge. Baseline:  Goal status: Ongoing  2.  Patient will increase Lower Extremity  Functional Scale to at least 50% to demonstrate improvement in functional tasks. Baseline: 25% Goal status: Ongoing (see above)  3.  Patient will increase BERG to at least 42/56 to demonstrate decreased risk of falling. Baseline: 27/56 Goal status: Ongoing (see above)  4.  Pt will report being able to ambulate at least 20 minutes with LRAD without increased pain and no loss of balance to allow for community ambulation. Baseline:  Goal status: Ongoing  5.  Pt will increase LE strength to at least 4+/5 to allow him to perform sit to stand without UE use and navigate a curb with LRAD safely. Baseline:  Goal status: Ongoing  PLAN:  PT FREQUENCY: 2x/week  PT DURATION: 8 weeks  PLANNED INTERVENTIONS: Therapeutic exercises, Therapeutic activity, Neuromuscular re-education, Balance training, Gait training, Patient/Family education, Self Care, Joint mobilization, Joint manipulation, Stair training, Vestibular training, Canalith repositioning, Aquatic Therapy, Dry Needling, Electrical stimulation, Spinal manipulation, Spinal mobilization, Cryotherapy, Moist heat, Taping, Traction, Ultrasound, Ionotophoresis 4mg /ml Dexamethasone, Manual therapy, and Re-evaluation  PLAN FOR NEXT SESSION: Strengthening, balance, flexibility, focus on proximal hip and core strength, aquatics    Reather Laurence, PT, DPT 04/16/23, 10:39 AM  [facility] [address] [place], [place] [address] Phone # [phone] Fax [phone]

## 2023-04-18 ENCOUNTER — Encounter (HOSPITAL_BASED_OUTPATIENT_CLINIC_OR_DEPARTMENT_OTHER): Payer: Self-pay

## 2023-04-19 ENCOUNTER — Ambulatory Visit: Payer: Medicare HMO | Admitting: Physical Therapy

## 2023-04-19 ENCOUNTER — Encounter: Payer: Self-pay | Admitting: Physical Therapy

## 2023-04-19 DIAGNOSIS — R293 Abnormal posture: Secondary | ICD-10-CM

## 2023-04-19 DIAGNOSIS — R2689 Other abnormalities of gait and mobility: Secondary | ICD-10-CM

## 2023-04-19 DIAGNOSIS — M6281 Muscle weakness (generalized): Secondary | ICD-10-CM | POA: Diagnosis not present

## 2023-04-19 DIAGNOSIS — R2681 Unsteadiness on feet: Secondary | ICD-10-CM | POA: Diagnosis not present

## 2023-04-19 DIAGNOSIS — R262 Difficulty in walking, not elsewhere classified: Secondary | ICD-10-CM

## 2023-04-19 NOTE — Therapy (Signed)
OUTPATIENT PHYSICAL THERAPY TREATMENT NOTE   Patient Name: Brandon [name] MRN: [id number] DOB:10-23-1945, 77 y.o., male Today's Date: 04/19/2023    END OF SESSION:  PT End of Session - 04/19/23 1539     Visit Number 19    Date for PT Re-Evaluation 05/03/23    Authorization Type [employer]    Authorization Time Period 03/09/23 - 05/03/2023    Authorization - Visit Number 9    Progress Note Due on Visit 27    PT Start Time 1345    PT Stop Time 1430    PT Time Calculation (min) 45 min    Activity Tolerance Patient tolerated treatment well    Behavior During Therapy WFL for tasks assessed/performed                 Past Medical History:  Diagnosis Date   Allergy    Basal cell carcinoma    GERD (gastroesophageal reflux disease)    History of kidney cancer    benign; told not cancerous   Hypertension    Past Surgical History:  Procedure Laterality Date   CATARACT EXTRACTION Bilateral    CRANIOTOMY Bilateral 12/25/2021   Procedure: CRANIOTOMY HEMATOMA EVACUATION SUBDURAL;  Surgeon: Julio Sicks, MD;  Location: [facility] OR;  Service: Neurosurgery;  Laterality: Bilateral;   HERNIA REPAIR     x2   KIDNEY SURGERY Right    Had right kidney removed.    TONSILLECTOMY     VASECTOMY     Patient Active Problem List   Diagnosis Date Noted   Cognitive and neurobehavioral dysfunction following brain injury (HCC) 02/07/2022   Gait difficulty 02/07/2022   Traumatic brain injury with loss of consciousness (HCC) 12/30/2021   SDH (subdural hematoma) (HCC) 12/24/2021   Hyperlipidemia 02/13/2018   BPH (benign prostatic hyperplasia) 01/31/2017   Heart murmur 01/20/2016   HTN (hypertension) 03/09/2012    PCP: Shirline Frees, NP  REFERRING PROVIDER: Shirline Frees, NP  REFERRING DIAG: R26.81 (ICD-10-CM) - Gait instability  THERAPY DIAG:  Balance problem  Muscle weakness (generalized)  Unsteadiness on feet  Difficulty in walking, not elsewhere classified  Abnormal  posture  Rationale for Evaluation and Treatment: Rehabilitation  ONSET DATE: Pt and wife report that frequent falls have been occurring for the past year  SUBJECTIVE:   SUBJECTIVE STATEMENT: My balance is definitely better.  PERTINENT HISTORY: Subdural Hematoma, HTN, GERD, Basal Cell Carcinoma, Hx of benign kidney mass, OA  PAIN:  Are you having pain? Yes: NPRS scale: 3/10 Pain location: bilateral knees (right greater than left) Pain description: sore Aggravating factors: unknown Relieving factors: Voltaren gel  PRECAUTIONS: Fall  RED FLAGS: None   WEIGHT BEARING RESTRICTIONS: No  FALLS:  Has patient fallen in last 6 months? Yes. Number of falls 3  LIVING ENVIRONMENT: Lives with: lives with their spouse Lives in: House/apartment Stairs:  one level home, one step to enter porch Has following equipment at home: Single point cane, Environmental consultant - 2 wheeled, Environmental consultant - 4 wheeled, shower chair, and Grab bars  OCCUPATION: retired  PLOF: Requires assistive device for independence and Leisure: walking with wife, going out to eat, movies  PATIENT GOALS: To be able to walk into wedding venue for granddaughter's wedding on September 21st, 2024.  NEXT MD VISIT: Shirline Frees on 07/01/2022  OBJECTIVE:   DIAGNOSTIC FINDINGS: Head CT on 04/27/2023: IMPRESSION: 1. Stable 6 mm acute on chronic left subdural hematoma. No associated midline shift. 2. Stable 6 mm chronic right subdural hematoma. 3. No new  intracranial abnormality. 4. No acute displaced fracture or traumatic listhesis of the cervical spine. 5. Persistent right apical subpleural 1.5 cm ground-glass nodular-like airspace opacity (increased in size from 2018). Adenocarcinoma is not excluded. Additional imaging evaluation or consultation with Pulmonology or Thoracic Surgery recommended.  PATIENT SURVEYS:  Eval:  LEFS 20 / 80 = 25.0 % 03/05/2023:  Lower Extremity Functional Score: 35 / 80 = 43.8 %  COGNITION: Overall  cognitive status:  wife reports occasionally pt has word finding problems and some memory impairments      SENSATION: Wife reports that sometimes left hand has "spasms"  MUSCLE LENGTH: Hamstrings: Tightness bilaterally  POSTURE: rounded shoulders, forward head, and flexed trunk    LOWER EXTREMITY ROM:  WFL  LOWER EXTREMITY MMT:  Eval: Right LE strength grossly 4-/5 Left LE strength grossly 4 to 4+/5 throughout  FUNCTIONAL TESTS:  Eval:   5 times sit to stand: 14.96 sec with UE use required Timed up and go (TUG): 13.71 sec without UE device with unsteadiness noted especially with 180 degree turn BERG:  27/56  01/24/2023: 3 minute walk:  378 ft with SPC (pt with shuffling gait pattern)  02/26/2023: Timed up and go (TUG): 11.69 sec 5 times sit to stand: 13.28 sec with UE use required 3 minute walk: 470 ft without assistive device  03/05/2023: BERG: 39/56  04/02/2023: 3 minute walk: 454 ft  04/09/2023: Timed up and go (TUG): 10.16 sec 5 times sit to stand: 15.81 sec first time, 13.94 sec second time 3 minute walk: 499 ft  GAIT: Distance walked: >200 ft Assistive device utilized: Single point cane Level of assistance: SBA Comments: Pt with unsteady gait noted and requires SBA   TODAY'S TREATMENT:     04/19/23: Pt arrives for aquatic physical therapy. Treatment took place in 3.5-5.5 feet of water. Water temperature was 91 degrees F. Pt entered the pool via stairs step to step with heavy use of the rails. Pt requires buoyancy of water for support and to offload joints with strengthening exercises.   Seated water bench with 75% submersion Pt performed seated LE AROM exercises with concurrent discussion of current status and pain assessment. 75% depth water walking with yellow noodle 10x each direction, some LOB but pt could self correct. Hip 3 ways 15x Bil with UE assit on side of pool  High knee forward marching 6 lengths of pool holding on the yellow noodle. Tandem  walking (slightly wider than heel-toe to increase success) 4 lengths holding hand floats. Static stance with small step forward and UE horizontal adb/add with hand float 2x10 Bil. RTLE step ups 2x15, short seated rest break d/t LE fatigue.                                                                                                        DATE:  04/16/2023 Nustep level 5 x6 min with PT present to discuss status Sit to/from stand on chair with foam pad to elevate surface 2x5 without UE support Seated LAQ and marching with 3# on ankles 2x10 each bilat Ambulation to  cancer gym and back with 3# on bilat ankles x2 with seated recovery period between the laps Standing with 3# on bilat ankles: hip abduction, hip extension.  2x10 each bilat Marching on trampoline with UE assist x2 min Leg Press (seat at 9) 120# 2x10 Backwards resisted ambulation with 15# cable pulley x5   04/09/2023 Nustep level 5 x6 min with PT present to discuss status TUG  Sit to/from stand 2x5 3 minute walk: 499 ft Seated LAQ and marching with 3# on ankles 2x10 each bilat Standing with 3# on bilat ankles: hip abduction, hip extension.  2x10 each bilat Leg Press (seat at 9) 120# 2x10 Backwards resisted ambulation with 15# cable pulley 2x5 Alt toe tap to 6" step 2x10  bilat   PATIENT EDUCATION:  Education details: aquatic exercise technique and rationale Person educated: Patient and Spouse Education method: Programmer, multimedia, Facilities manager,  Education comprehension: verbalized understanding  HOME EXERCISE PROGRAM: Access Code: [email or web address] URL: [email or web address] Date: 01/10/2023 Prepared by: Clydie Braun Menke  Exercises - Sit to Stand  - 1 x daily - 7 x weekly - 2 sets - 10 reps - Standing Hip Abduction with Counter Support  - 1 x daily - 7 x weekly - 2 sets - 10 reps - Standing Hip Extension with Counter Support  - 1 x daily - 7 x weekly - 2 sets - 10 reps - Standing March with Counter Support  - 1 x daily  - 7 x weekly - 2 sets - 10 reps - Heel Raises with Counter Support  - 1 x daily - 7 x weekly - 2 sets - 10 reps  ASSESSMENT:  CLINICAL IMPRESSION: Pt presents to aquatic PT demonstrating much steadier gait on land and when in th ewater. Turns and RT single leg balance activities continue to challenge pt greatest. No increase in RT knee pain when exercising in the water.  OBJECTIVE IMPAIRMENTS: decreased balance, difficulty walking, decreased strength, impaired flexibility, postural dysfunction, and pain.   ACTIVITY LIMITATIONS: carrying, lifting, bending, standing, squatting, and stairs  PARTICIPATION LIMITATIONS: cleaning and community activity  PERSONAL FACTORS: Past/current experiences, Time since onset of injury/illness/exacerbation, and 3+ comorbidities: subdural hematoma, HTN, OA  are also affecting patient's functional outcome.   REHAB POTENTIAL: Good  CLINICAL DECISION MAKING: Evolving/moderate complexity  EVALUATION COMPLEXITY: Moderate   GOALS: Goals reviewed with patient? Yes  SHORT TERM GOALS: Target date: 02/01/2023 Pt will be independent with initial HEP. Baseline: Goal status: MET  2.  Pt will improve ambulation safety with least restrictive assistive device to allow him to safely ambulate distances required for granddaughter's wedding on 02/02/2023. Baseline:  Goal status: Met   LONG TERM GOALS: Target date: 05/03/2023 (Goals updated on 03/05/2023)  Pt will be independent with advanced HEP to allow for self progression post discharge. Baseline:  Goal status: Ongoing  2.  Patient will increase Lower Extremity Functional Scale to at least 50% to demonstrate improvement in functional tasks. Baseline: 25% Goal status: Ongoing (see above)  3.  Patient will increase BERG to at least 42/56 to demonstrate decreased risk of falling. Baseline: 27/56 Goal status: Ongoing (see above)  4.  Pt will report being able to ambulate at least 20 minutes with LRAD without  increased pain and no loss of balance to allow for community ambulation. Baseline:  Goal status: Ongoing  5.  Pt will increase LE strength to at least 4+/5 to allow him to perform sit to stand without UE use and navigate a curb with LRAD safely. Baseline:  Goal status: Ongoing   PLAN:  PT FREQUENCY: 2x/week  PT DURATION: 8 weeks  PLANNED INTERVENTIONS: Therapeutic exercises, Therapeutic activity, Neuromuscular re-education, Balance training, Gait training, Patient/Family education, Self Care, Joint mobilization, Joint manipulation, Stair training, Vestibular training, Canalith repositioning, Aquatic Therapy, Dry Needling, Electrical stimulation, Spinal manipulation, Spinal mobilization, Cryotherapy, Moist heat, Taping, Traction, Ultrasound, Ionotophoresis 4mg /ml Dexamethasone, Manual therapy, and Re-evaluation  PLAN FOR NEXT SESSION: Strengthening, balance, flexibility, focus on proximal hip and core strength, aquatics    Ane Payment, PTA 04/19/23 3:40 PM   [facility] [address], [address] [place], [place] [address] Phone # [phone] Fax [phone]

## 2023-04-22 ENCOUNTER — Encounter: Payer: Self-pay | Admitting: Rehabilitative and Restorative Service Providers"

## 2023-04-22 ENCOUNTER — Ambulatory Visit: Payer: Medicare HMO | Admitting: Rehabilitative and Restorative Service Providers"

## 2023-04-22 DIAGNOSIS — R2681 Unsteadiness on feet: Secondary | ICD-10-CM

## 2023-04-22 DIAGNOSIS — R293 Abnormal posture: Secondary | ICD-10-CM

## 2023-04-22 DIAGNOSIS — M6281 Muscle weakness (generalized): Secondary | ICD-10-CM

## 2023-04-22 DIAGNOSIS — R262 Difficulty in walking, not elsewhere classified: Secondary | ICD-10-CM | POA: Diagnosis not present

## 2023-04-22 DIAGNOSIS — R2689 Other abnormalities of gait and mobility: Secondary | ICD-10-CM | POA: Diagnosis not present

## 2023-04-22 NOTE — Therapy (Signed)
OUTPATIENT PHYSICAL THERAPY TREATMENT NOTE   Patient Name: CELVIN PARA MRN: [id number] DOB:[birth year], 77 y.o., male Today's Date: 04/22/2023    END OF SESSION:  PT End of Session - 04/22/23 1232     Visit Number 20    Date for PT Re-Evaluation 05/03/23    Authorization Type [employer]    Authorization Time Period 04/22/2023 - 06/28/2023    Authorization - Visit Number 1    Authorization - Number of Visits 20    Progress Note Due on Visit 27    PT Start Time 1230    PT Stop Time 1310    PT Time Calculation (min) 40 min    Activity Tolerance Patient tolerated treatment well    Behavior During Therapy WFL for tasks assessed/performed                 Past Medical History:  Diagnosis Date   Allergy    Basal cell carcinoma    GERD (gastroesophageal reflux disease)    History of kidney cancer    benign; told not cancerous   Hypertension    Past Surgical History:  Procedure Laterality Date   CATARACT EXTRACTION Bilateral    CRANIOTOMY Bilateral 12/25/2021   Procedure: CRANIOTOMY HEMATOMA EVACUATION SUBDURAL;  Surgeon: Julio Sicks, MD;  Location: [facility] OR;  Service: Neurosurgery;  Laterality: Bilateral;   HERNIA REPAIR     x2   KIDNEY SURGERY Right    Had right kidney removed.    TONSILLECTOMY     VASECTOMY     Patient Active Problem List   Diagnosis Date Noted   Cognitive and neurobehavioral dysfunction following brain injury (HCC) 02/07/2022   Gait difficulty 02/07/2022   Traumatic brain injury with loss of consciousness (HCC) 12/30/2021   SDH (subdural hematoma) (HCC) 12/24/2021   Hyperlipidemia 02/13/2018   BPH (benign prostatic hyperplasia) 01/31/2017   Heart murmur 01/20/2016   HTN (hypertension) 03/09/2012    PCP: Shirline Frees, NP  REFERRING PROVIDER: Shirline Frees, NP  REFERRING DIAG: R26.81 (ICD-10-CM) - Gait instability  THERAPY DIAG:  Balance problem  Muscle weakness (generalized)  Unsteadiness on feet  Difficulty in walking,  not elsewhere classified  Abnormal posture  Rationale for Evaluation and Treatment: Rehabilitation  ONSET DATE: Pt and wife report that frequent falls have been occurring for the past year  SUBJECTIVE:   SUBJECTIVE STATEMENT:  Pt reports primary complaint of knee pain.  PERTINENT HISTORY: Subdural Hematoma, HTN, GERD, Basal Cell Carcinoma, Hx of benign kidney mass, OA  PAIN:  Are you having pain? Yes: NPRS scale: 3-5/10 Pain location: bilateral knees (right greater than left) Pain description: sore Aggravating factors: unknown Relieving factors: Voltaren gel  PRECAUTIONS: Fall  RED FLAGS: None   WEIGHT BEARING RESTRICTIONS: No  FALLS:  Has patient fallen in last 6 months? Yes. Number of falls 3  LIVING ENVIRONMENT: Lives with: lives with their spouse Lives in: House/apartment Stairs:  one level home, one step to enter porch Has following equipment at home: Single point cane, Environmental consultant - 2 wheeled, Environmental consultant - 4 wheeled, shower chair, and Grab bars  OCCUPATION: retired  PLOF: Requires assistive device for independence and Leisure: walking with wife, going out to eat, movies  PATIENT GOALS: To be able to walk into wedding venue for granddaughter's wedding on September 21st, 2024.  NEXT MD VISIT: Shirline Frees on 07/01/2022  OBJECTIVE:   DIAGNOSTIC FINDINGS: Head CT on 04/27/2023: IMPRESSION: 1. Stable 6 mm acute on chronic left subdural hematoma. No associated midline  shift. 2. Stable 6 mm chronic right subdural hematoma. 3. No new intracranial abnormality. 4. No acute displaced fracture or traumatic listhesis of the cervical spine. 5. Persistent right apical subpleural 1.5 cm ground-glass nodular-like airspace opacity (increased in size from 2018). Adenocarcinoma is not excluded. Additional imaging evaluation or consultation with Pulmonology or Thoracic Surgery recommended.  PATIENT SURVEYS:  Eval:  LEFS 20 / 80 = 25.0 % 03/05/2023:  Lower Extremity Functional  Score: 35 / 80 = 43.8 %  COGNITION: Overall cognitive status:  wife reports occasionally pt has word finding problems and some memory impairments      SENSATION: Wife reports that sometimes left hand has "spasms"  MUSCLE LENGTH: Hamstrings: Tightness bilaterally  POSTURE: rounded shoulders, forward head, and flexed trunk    LOWER EXTREMITY ROM:  WFL  LOWER EXTREMITY MMT:  Eval: Right LE strength grossly 4-/5 Left LE strength grossly 4 to 4+/5 throughout  FUNCTIONAL TESTS:  Eval:   5 times sit to stand: 14.96 sec with UE use required Timed up and go (TUG): 13.71 sec without UE device with unsteadiness noted especially with 180 degree turn BERG:  27/56  01/24/2023: 3 minute walk:  378 ft with SPC (pt with shuffling gait pattern)  02/26/2023: Timed up and go (TUG): 11.69 sec 5 times sit to stand: 13.28 sec with UE use required 3 minute walk: 470 ft without assistive device  03/05/2023: BERG: 39/56  04/02/2023: 3 minute walk: 454 ft  04/09/2023: Timed up and go (TUG): 10.16 sec 5 times sit to stand: 15.81 sec first time, 13.94 sec second time 3 minute walk: 499 ft  04/22/2023: 6 minute walk:  955 ft  GAIT: Distance walked: >200 ft Assistive device utilized: Single point cane Level of assistance: SBA Comments: Pt with unsteady gait noted and requires SBA   TODAY'S TREATMENT:     DATE:  04/22/2023 Nustep level 5 x6 min with PT present to discuss status Sit to/from stand on chair with foam pad to elevate surface 2x5 without UE support 6 minute walk test 955 ft Seated LAQ and marching with 4# on ankles 2x10 each bilat Standing with 4# on bilat ankles: hip abduction, hip extension, high marching.  2x10 each bilat Alt LE toe taps to 2nd step (12 inches) without UE support 2x10 Step downs from 6" step x10 bilat Leg Press (seat at 9) 120# 2x10 Seated hamstring stretch 2x20 sec bilat Standing rocker board for DF/PF x2 min   04/19/23:  Pt arrives for aquatic  physical therapy. Treatment took place in 3.5-5.5 feet of water. Water temperature was 91 degrees F. Pt entered the pool via stairs step to step with heavy use of the rails. Pt requires buoyancy of water for support and to offload joints with strengthening exercises.   Seated water bench with 75% submersion Pt performed seated LE AROM exercises with concurrent discussion of current status and pain assessment. 75% depth water walking with yellow noodle 10x each direction, some LOB but pt could self correct. Hip 3 ways 15x Bil with UE assit on side of pool  High knee forward marching 6 lengths of pool holding on the yellow noodle. Tandem walking (slightly wider than heel-toe to increase success) 4 lengths holding hand floats. Static stance with small step forward and UE horizontal adb/add with hand float 2x10 Bil. RTLE step ups 2x15, short seated rest break d/t LE fatigue.  04/16/2023 Nustep level 5 x6 min with PT present to discuss status Sit to/from stand on chair with foam pad to elevate surface 2x5 without UE support Seated LAQ and marching with 3# on ankles 2x10 each bilat Ambulation to cancer gym and back with 3# on bilat ankles x2 with seated recovery period between the laps Standing with 3# on bilat ankles: hip abduction, hip extension.  2x10 each bilat Marching on trampoline with UE assist x2 min Leg Press (seat at 9) 120# 2x10 Backwards resisted ambulation with 15# cable pulley x5    PATIENT EDUCATION:  Education details: aquatic exercise technique and rationale Person educated: Patient and Spouse Education method: Programmer, multimedia, Facilities manager,  Education comprehension: verbalized understanding  HOME EXERCISE PROGRAM: Access Code: [email or web address] URL: [email or web address] Date: 01/10/2023 Prepared by: Clydie Braun Kaemon Barnett  Exercises - Sit to Stand  - 1 x daily - 7 x weekly - 2  sets - 10 reps - Standing Hip Abduction with Counter Support  - 1 x daily - 7 x weekly - 2 sets - 10 reps - Standing Hip Extension with Counter Support  - 1 x daily - 7 x weekly - 2 sets - 10 reps - Standing March with Counter Support  - 1 x daily - 7 x weekly - 2 sets - 10 reps - Heel Raises with Counter Support  - 1 x daily - 7 x weekly - 2 sets - 10 reps  ASSESSMENT:  CLINICAL IMPRESSION:  Mr Fetterman presents to skilled PT reporting that he is feeling okay, but continues to have knee pain.  Patient able to progress with weight today during session.  Additionally, able to complete a 6 minute walk test with some dyspnea, but no loss of balance during ambulation.  Patient continues to progress towards improved LE strength and functional balance.  OBJECTIVE IMPAIRMENTS: decreased balance, difficulty walking, decreased strength, impaired flexibility, postural dysfunction, and pain.   ACTIVITY LIMITATIONS: carrying, lifting, bending, standing, squatting, and stairs  PARTICIPATION LIMITATIONS: cleaning and community activity  PERSONAL FACTORS: Past/current experiences, Time since onset of injury/illness/exacerbation, and 3+ comorbidities: subdural hematoma, HTN, OA  are also affecting patient's functional outcome.   REHAB POTENTIAL: Good  CLINICAL DECISION MAKING: Evolving/moderate complexity  EVALUATION COMPLEXITY: Moderate   GOALS: Goals reviewed with patient? Yes  SHORT TERM GOALS: Target date: 02/01/2023 Pt will be independent with initial HEP. Baseline: Goal status: MET  2.  Pt will improve ambulation safety with least restrictive assistive device to allow him to safely ambulate distances required for granddaughter's wedding on 02/02/2023. Baseline:  Goal status: Met   LONG TERM GOALS: Target date: 05/03/2023 (Goals updated on 03/05/2023)  Pt will be independent with advanced HEP to allow for self progression post discharge. Baseline:  Goal status: Ongoing  2.  Patient will  increase Lower Extremity Functional Scale to at least 50% to demonstrate improvement in functional tasks. Baseline: 25% Goal status: Ongoing (see above)  3.  Patient will increase BERG to at least 42/56 to demonstrate decreased risk of falling. Baseline: 27/56 Goal status: Ongoing (see above)  4.  Pt will report being able to ambulate at least 20 minutes with LRAD without increased pain and no loss of balance to allow for community ambulation. Baseline:  Goal status: Ongoing  5.  Pt will increase LE strength to at least 4+/5 to allow him to perform sit to stand without UE use and navigate a curb with LRAD safely. Baseline:  Goal status: Ongoing   PLAN:  PT FREQUENCY: 2x/week  PT DURATION: 8 weeks  PLANNED INTERVENTIONS: Therapeutic exercises, Therapeutic activity, Neuromuscular re-education, Balance training, Gait training, Patient/Family education, Self Care, Joint mobilization, Joint manipulation, Stair training, Vestibular training, Canalith repositioning, Aquatic Therapy, Dry Needling, Electrical stimulation, Spinal manipulation, Spinal mobilization, Cryotherapy, Moist heat, Taping, Traction, Ultrasound, Ionotophoresis 4mg /ml Dexamethasone, Manual therapy, and Re-evaluation  PLAN FOR NEXT SESSION: Strengthening, balance, flexibility, focus on proximal hip and core strength, aquatics    Reather Laurence, PT, DPT 04/22/23, 1:19 PM  [facility] [address] [place], [place] [address] Phone # [phone] Fax [phone]

## 2023-04-25 ENCOUNTER — Encounter: Payer: Self-pay | Admitting: Rehabilitative and Restorative Service Providers"

## 2023-04-25 ENCOUNTER — Ambulatory Visit: Payer: Medicare HMO | Admitting: Rehabilitative and Restorative Service Providers"

## 2023-04-25 DIAGNOSIS — R2681 Unsteadiness on feet: Secondary | ICD-10-CM

## 2023-04-25 DIAGNOSIS — R2689 Other abnormalities of gait and mobility: Secondary | ICD-10-CM

## 2023-04-25 DIAGNOSIS — R293 Abnormal posture: Secondary | ICD-10-CM | POA: Diagnosis not present

## 2023-04-25 DIAGNOSIS — M6281 Muscle weakness (generalized): Secondary | ICD-10-CM

## 2023-04-25 DIAGNOSIS — R262 Difficulty in walking, not elsewhere classified: Secondary | ICD-10-CM | POA: Diagnosis not present

## 2023-04-25 NOTE — Therapy (Signed)
OUTPATIENT PHYSICAL THERAPY TREATMENT NOTE   Patient Name: Brandon [name] MRN: [id number] DOB:Nov 28, 1945, 77 y.o., male Today's Date: 04/25/2023    END OF SESSION:  PT End of Session - 04/25/23 1228     Visit Number 21    Date for PT Re-Evaluation 05/03/23    Authorization Type [employer]    Authorization Time Period 04/22/2023 - 06/28/2023    Authorization - Visit Number 2    Authorization - Number of Visits 20    Progress Note Due on Visit 27    PT Start Time 1222    PT Stop Time 1305    PT Time Calculation (min) 43 min    Activity Tolerance Patient tolerated treatment well    Behavior During Therapy WFL for tasks assessed/performed                 Past Medical History:  Diagnosis Date   Allergy    Basal cell carcinoma    GERD (gastroesophageal reflux disease)    History of kidney cancer    benign; told not cancerous   Hypertension    Past Surgical History:  Procedure Laterality Date   CATARACT EXTRACTION Bilateral    CRANIOTOMY Bilateral 12/25/2021   Procedure: CRANIOTOMY HEMATOMA EVACUATION SUBDURAL;  Surgeon: Julio Sicks, MD;  Location: [facility] OR;  Service: Neurosurgery;  Laterality: Bilateral;   HERNIA REPAIR     x2   KIDNEY SURGERY Right    Had right kidney removed.    TONSILLECTOMY     VASECTOMY     Patient Active Problem List   Diagnosis Date Noted   Cognitive and neurobehavioral dysfunction following brain injury (HCC) 02/07/2022   Gait difficulty 02/07/2022   Traumatic brain injury with loss of consciousness (HCC) 12/30/2021   SDH (subdural hematoma) (HCC) 12/24/2021   Hyperlipidemia 02/13/2018   BPH (benign prostatic hyperplasia) 01/31/2017   Heart murmur 01/20/2016   HTN (hypertension) 03/09/2012    PCP: Shirline Frees, NP  REFERRING PROVIDER: Shirline Frees, NP  REFERRING DIAG: R26.81 (ICD-10-CM) - Gait instability  THERAPY DIAG:  Balance problem  Muscle weakness (generalized)  Unsteadiness on feet  Difficulty in walking,  not elsewhere classified  Abnormal posture  Rationale for Evaluation and Treatment: Rehabilitation  ONSET DATE: Pt and wife report that frequent falls have been occurring for the past year  SUBJECTIVE:   SUBJECTIVE STATEMENT:  Pt denies any new falls, no new complaints.  PERTINENT HISTORY: Subdural Hematoma, HTN, GERD, Basal Cell Carcinoma, Hx of benign kidney mass, OA  PAIN:  Are you having pain? Yes: NPRS scale: 4/10 Pain location: bilateral knees (right greater than left) Pain description: sore Aggravating factors: unknown Relieving factors: Voltaren gel  PRECAUTIONS: Fall  RED FLAGS: None   WEIGHT BEARING RESTRICTIONS: No  FALLS:  Has patient fallen in last 6 months? Yes. Number of falls 3  LIVING ENVIRONMENT: Lives with: lives with their spouse Lives in: House/apartment Stairs:  one level home, one step to enter porch Has following equipment at home: Single point cane, Environmental consultant - 2 wheeled, Environmental consultant - 4 wheeled, shower chair, and Grab bars  OCCUPATION: retired  PLOF: Requires assistive device for independence and Leisure: walking with wife, going out to eat, movies  PATIENT GOALS: To be able to walk into wedding venue for granddaughter's wedding on September 21st, 2024.  NEXT MD VISIT: Shirline Frees on 07/01/2022  OBJECTIVE:   DIAGNOSTIC FINDINGS: Head CT on 04/27/2023: IMPRESSION: 1. Stable 6 mm acute on chronic left subdural hematoma. No associated  midline shift. 2. Stable 6 mm chronic right subdural hematoma. 3. No new intracranial abnormality. 4. No acute displaced fracture or traumatic listhesis of the cervical spine. 5. Persistent right apical subpleural 1.5 cm ground-glass nodular-like airspace opacity (increased in size from 2018). Adenocarcinoma is not excluded. Additional imaging evaluation or consultation with Pulmonology or Thoracic Surgery recommended.  PATIENT SURVEYS:  Eval:  LEFS 20 / 80 = 25.0 % 03/05/2023:  Lower Extremity Functional  Score: 35 / 80 = 43.8 %  COGNITION: Overall cognitive status:  wife reports occasionally pt has word finding problems and some memory impairments      SENSATION: Wife reports that sometimes left hand has "spasms"  MUSCLE LENGTH: Hamstrings: Tightness bilaterally  POSTURE: rounded shoulders, forward head, and flexed trunk    LOWER EXTREMITY ROM:  WFL  LOWER EXTREMITY MMT:  Eval: Right LE strength grossly 4-/5 Left LE strength grossly 4 to 4+/5 throughout  FUNCTIONAL TESTS:  Eval:   5 times sit to stand: 14.96 sec with UE use required Timed up and go (TUG): 13.71 sec without UE device with unsteadiness noted especially with 180 degree turn BERG:  27/56  01/24/2023: 3 minute walk:  378 ft with SPC (pt with shuffling gait pattern)  02/26/2023: Timed up and go (TUG): 11.69 sec 5 times sit to stand: 13.28 sec with UE use required 3 minute walk: 470 ft without assistive device  03/05/2023: BERG: 39/56  04/02/2023: 3 minute walk: 454 ft  04/09/2023: Timed up and go (TUG): 10.16 sec 5 times sit to stand: 15.81 sec first time, 13.94 sec second time 3 minute walk: 499 ft  04/22/2023: 6 minute walk:  955 ft  GAIT: Distance walked: >200 ft Assistive device utilized: Single point cane Level of assistance: SBA Comments: Pt with unsteady gait noted and requires SBA   TODAY'S TREATMENT:     DATE:  04/25/2023 Nustep level 5 x6 min with PT present to discuss status Sit to/from stand on chair with foam pad to elevate surface 2x6 without UE support Seated LAQ and marching with 4# on ankles 2x10 each bilat Standing with 4# on bilat ankles: hip abduction, hip extension, high marching.  2x10 each bilat Leg Press (seat at 9) 125# 2x10 Seated hamstring stretch 2x20 sec bilat Alt LE toe taps to 2nd step (12 inches) without UE support 2x10 Step downs from 6" step x10 bilat Standing tandem stance at barre for balance, as needed, x20 sec bilat Marching on trampoline with UE  assist x2 min   04/22/2023 Nustep level 5 x6 min with PT present to discuss status Sit to/from stand on chair with foam pad to elevate surface 2x5 without UE support 6 minute walk test 955 ft Seated LAQ and marching with 4# on ankles 2x10 each bilat Standing with 4# on bilat ankles: hip abduction, hip extension, high marching.  2x10 each bilat Alt LE toe taps to 2nd step (12 inches) without UE support 2x10 Step downs from 6" step x10 bilat Leg Press (seat at 9) 120# 2x10 Seated hamstring stretch 2x20 sec bilat Standing rocker board for DF/PF x2 min   04/19/23:  Pt arrives for aquatic physical therapy. Treatment took place in 3.5-5.5 feet of water. Water temperature was 91 degrees F. Pt entered the pool via stairs step to step with heavy use of the rails. Pt requires buoyancy of water for support and to offload joints with strengthening exercises.   Seated water bench with 75% submersion Pt performed seated LE AROM exercises with concurrent  discussion of current status and pain assessment. 75% depth water walking with yellow noodle 10x each direction, some LOB but pt could self correct. Hip 3 ways 15x Bil with UE assit on side of pool  High knee forward marching 6 lengths of pool holding on the yellow noodle. Tandem walking (slightly wider than heel-toe to increase success) 4 lengths holding hand floats. Static stance with small step forward and UE horizontal adb/add with hand float 2x10 Bil. RTLE step ups 2x15, short seated rest break d/t LE fatigue.                                                                                                         PATIENT EDUCATION:  Education details: aquatic exercise technique and rationale Person educated: Patient and Spouse Education method: Explanation, Facilities manager,  Education comprehension: verbalized understanding  HOME EXERCISE PROGRAM: Access Code: [email or web address] URL: [email or web address] Date: 01/10/2023 Prepared by: Clydie Braun  Steen Bisig  Exercises - Sit to Stand  - 1 x daily - 7 x weekly - 2 sets - 10 reps - Standing Hip Abduction with Counter Support  - 1 x daily - 7 x weekly - 2 sets - 10 reps - Standing Hip Extension with Counter Support  - 1 x daily - 7 x weekly - 2 sets - 10 reps - Standing March with Counter Support  - 1 x daily - 7 x weekly - 2 sets - 10 reps - Heel Raises with Counter Support  - 1 x daily - 7 x weekly - 2 sets - 10 reps  ASSESSMENT:  CLINICAL IMPRESSION:  Mr Schrandt presents to PT stating that he continues to not have any falling.  Pt's wife states that their daughter has even noticed improvements in patient's ambulation.  Patient able to progress with weight on leg press today during session.  Additionally, pt able to perform one additional rep during sit to stand on each set.  Patient continues to progress with LE strengthening and with improved step up noted to step onto the trampoline, with decreased foot drag noted.  Patient to have PT reassessment next week.  OBJECTIVE IMPAIRMENTS: decreased balance, difficulty walking, decreased strength, impaired flexibility, postural dysfunction, and pain.   ACTIVITY LIMITATIONS: carrying, lifting, bending, standing, squatting, and stairs  PARTICIPATION LIMITATIONS: cleaning and community activity  PERSONAL FACTORS: Past/current experiences, Time since onset of injury/illness/exacerbation, and 3+ comorbidities: subdural hematoma, HTN, OA  are also affecting patient's functional outcome.   REHAB POTENTIAL: Good  CLINICAL DECISION MAKING: Evolving/moderate complexity  EVALUATION COMPLEXITY: Moderate   GOALS: Goals reviewed with patient? Yes  SHORT TERM GOALS: Target date: 02/01/2023 Pt will be independent with initial HEP. Baseline: Goal status: MET  2.  Pt will improve ambulation safety with least restrictive assistive device to allow him to safely ambulate distances required for granddaughter's wedding on 02/02/2023. Baseline:  Goal status:  Met   LONG TERM GOALS: Target date: 05/03/2023 (Goals updated on 03/05/2023)  Pt will be independent with advanced HEP to allow for self progression post discharge. Baseline:  Goal status: Ongoing  2.  Patient will increase Lower Extremity Functional Scale to at least 50% to demonstrate improvement in functional tasks. Baseline: 25% Goal status: Ongoing (see above)  3.  Patient will increase BERG to at least 42/56 to demonstrate decreased risk of falling. Baseline: 27/56 Goal status: Ongoing (see above)  4.  Pt will report being able to ambulate at least 20 minutes with LRAD without increased pain and no loss of balance to allow for community ambulation. Baseline:  Goal status: Ongoing  5.  Pt will increase LE strength to at least 4+/5 to allow him to perform sit to stand without UE use and navigate a curb with LRAD safely. Baseline:  Goal status: Ongoing   PLAN:  PT FREQUENCY: 2x/week  PT DURATION: 8 weeks  PLANNED INTERVENTIONS: Therapeutic exercises, Therapeutic activity, Neuromuscular re-education, Balance training, Gait training, Patient/Family education, Self Care, Joint mobilization, Joint manipulation, Stair training, Vestibular training, Canalith repositioning, Aquatic Therapy, Dry Needling, Electrical stimulation, Spinal manipulation, Spinal mobilization, Cryotherapy, Moist heat, Taping, Traction, Ultrasound, Ionotophoresis 4mg /ml Dexamethasone, Manual therapy, and Re-evaluation  PLAN FOR NEXT SESSION: Strengthening, balance, flexibility, focus on proximal hip and core strength, aquatics, 8 week reassessment visit    Rahima Fleishman, PT, DPT 04/25/23, 1:15 PM  [facility] [address] [place], [place] [address] Phone # [phone] Fax [phone]

## 2023-04-29 ENCOUNTER — Ambulatory Visit: Payer: Medicare HMO | Admitting: Rehabilitative and Restorative Service Providers"

## 2023-04-29 ENCOUNTER — Encounter: Payer: Self-pay | Admitting: Rehabilitative and Restorative Service Providers"

## 2023-04-29 DIAGNOSIS — R2681 Unsteadiness on feet: Secondary | ICD-10-CM | POA: Diagnosis not present

## 2023-04-29 DIAGNOSIS — R2689 Other abnormalities of gait and mobility: Secondary | ICD-10-CM

## 2023-04-29 DIAGNOSIS — M6281 Muscle weakness (generalized): Secondary | ICD-10-CM | POA: Diagnosis not present

## 2023-04-29 DIAGNOSIS — R293 Abnormal posture: Secondary | ICD-10-CM

## 2023-04-29 DIAGNOSIS — R262 Difficulty in walking, not elsewhere classified: Secondary | ICD-10-CM | POA: Diagnosis not present

## 2023-04-29 NOTE — Therapy (Signed)
OUTPATIENT PHYSICAL THERAPY TREATMENT NOTE AND REASSESSMENT NOTE  Patient Name: Brandon [name] MRN: [id number] DOB:03-Feb-1946, 77 y.o., male Today's Date: 04/29/2023   Progress Note Reporting Period 03/05/2023 to 04/29/2023  See note below for Objective Data and Assessment of Progress/Goals.     END OF SESSION:  PT End of Session - 04/29/23 1233     Visit Number 22    Date for PT Re-Evaluation 06/28/23    Authorization Type [employer]    Authorization Time Period 04/22/2023 - 06/28/2023    Authorization - Visit Number 3    Authorization - Number of Visits 20    Progress Note Due on Visit 32    PT Start Time 1230    PT Stop Time 1310    PT Time Calculation (min) 40 min    Activity Tolerance Patient tolerated treatment well    Behavior During Therapy WFL for tasks assessed/performed                 Past Medical History:  Diagnosis Date   Allergy    Basal cell carcinoma    GERD (gastroesophageal reflux disease)    History of kidney cancer    benign; told not cancerous   Hypertension    Past Surgical History:  Procedure Laterality Date   CATARACT EXTRACTION Bilateral    CRANIOTOMY Bilateral 12/25/2021   Procedure: CRANIOTOMY HEMATOMA EVACUATION SUBDURAL;  Surgeon: Julio Sicks, MD;  Location: [facility] OR;  Service: Neurosurgery;  Laterality: Bilateral;   HERNIA REPAIR     x2   KIDNEY SURGERY Right    Had right kidney removed.    TONSILLECTOMY     VASECTOMY     Patient Active Problem List   Diagnosis Date Noted   Cognitive and neurobehavioral dysfunction following brain injury (HCC) 02/07/2022   Gait difficulty 02/07/2022   Traumatic brain injury with loss of consciousness (HCC) 12/30/2021   SDH (subdural hematoma) (HCC) 12/24/2021   Hyperlipidemia 02/13/2018   BPH (benign prostatic hyperplasia) 01/31/2017   Heart murmur 01/20/2016   HTN (hypertension) 03/09/2012    PCP: Shirline Frees, NP  REFERRING PROVIDER: Shirline Frees, NP  REFERRING DIAG:  R26.81 (ICD-10-CM) - Gait instability  THERAPY DIAG:  Balance problem  Muscle weakness (generalized)  Unsteadiness on feet  Difficulty in walking, not elsewhere classified  Abnormal posture  Rationale for Evaluation and Treatment: Rehabilitation  ONSET DATE: Pt and wife report that frequent falls have been occurring for the past year  SUBJECTIVE:   SUBJECTIVE STATEMENT:  Pt reports similar knee pain of 4/10.  PERTINENT HISTORY: Subdural Hematoma, HTN, GERD, Basal Cell Carcinoma, Hx of benign kidney mass, OA  PAIN:  Are you having pain? Yes: NPRS scale: 4/10 Pain location: bilateral knees (right greater than left) Pain description: sore Aggravating factors: unknown Relieving factors: Voltaren gel  PRECAUTIONS: Fall  RED FLAGS: None   WEIGHT BEARING RESTRICTIONS: No  FALLS:  Has patient fallen in last 6 months? Yes. Number of falls 3  LIVING ENVIRONMENT: Lives with: lives with their spouse Lives in: House/apartment Stairs:  one level home, one step to enter porch Has following equipment at home: Single point cane, Environmental consultant - 2 wheeled, Environmental consultant - 4 wheeled, shower chair, and Grab bars  OCCUPATION: retired  PLOF: Requires assistive device for independence and Leisure: walking with wife, going out to eat, movies  PATIENT GOALS: To be able to walk into wedding venue for granddaughter's wedding on September 21st, 2024.  NEXT MD VISIT: Shirline Frees on 07/01/2022  OBJECTIVE:   DIAGNOSTIC FINDINGS: Head CT on 04/27/2023: IMPRESSION: 1. Stable 6 mm acute on chronic left subdural hematoma. No associated midline shift. 2. Stable 6 mm chronic right subdural hematoma. 3. No new intracranial abnormality. 4. No acute displaced fracture or traumatic listhesis of the cervical spine. 5. Persistent right apical subpleural 1.5 cm ground-glass nodular-like airspace opacity (increased in size from 2018). Adenocarcinoma is not excluded. Additional imaging evaluation  or consultation with Pulmonology or Thoracic Surgery recommended.  PATIENT SURVEYS:  Eval:  LEFS 20 / 80 = 25.0 % 03/05/2023:  Lower Extremity Functional Score: 35 / 80 = 43.8 % 04/29/2023:  Lower Extremity Functional Score: 59 / 80 = 73.8 %  COGNITION: Overall cognitive status:  wife reports occasionally pt has word finding problems and some memory impairments      SENSATION: Wife reports that sometimes left hand has "spasms"  MUSCLE LENGTH: Hamstrings: Tightness bilaterally  POSTURE: rounded shoulders, forward head, and flexed trunk    LOWER EXTREMITY ROM:  WFL  LOWER EXTREMITY MMT:  Eval: Right LE strength grossly 4-/5 Left LE strength grossly 4 to 4+/5 throughout  FUNCTIONAL TESTS:  Eval:   5 times sit to stand: 14.96 sec with UE use required Timed up and go (TUG): 13.71 sec without UE device with unsteadiness noted especially with 180 degree turn BERG:  27/56  01/24/2023: 3 minute walk:  378 ft with SPC (pt with shuffling gait pattern)  02/26/2023: Timed up and go (TUG): 11.69 sec 5 times sit to stand: 13.28 sec with UE use required 3 minute walk: 470 ft without assistive device  03/05/2023: BERG: 39/56  04/02/2023: 3 minute walk: 454 ft  04/09/2023: Timed up and go (TUG): 10.16 sec 5 times sit to stand: 15.81 sec first time, 13.94 sec second time 3 minute walk: 499 ft  04/22/2023: 6 minute walk:  955 ft  04/29/2023: 5 times sit to stand: 18.61 sec first test, 16.99 sec second time BERG: 41/56 6 minute walk: 980 ft  GAIT: Distance walked: >200 ft Assistive device utilized: Single point cane Level of assistance: SBA Comments: Pt with unsteady gait noted and requires SBA   TODAY'S TREATMENT:     DATE: 04/29/2023 Nustep level 5 x6 min with PT present to discuss status Sit to/from stand 2x5 with UE support (no foam pad) Seated LAQ and marching with 5# on ankles 2x10 each bilat Standing with 5# on bilat ankles: hip abduction, hip extension,  high marching.  2x10 each bilat Seated hamstring stretch 2x20 sec bilat Lower Extremity Functional Score: 59 / 80 = 73.8 % Leg Press (seat at 9) 125# 2x10   DATE: 04/25/2023 Nustep level 5 x6 min with PT present to discuss status Sit to/from stand on chair with foam pad to elevate surface 2x6 without UE support Seated LAQ and marching with 4# on ankles 2x10 each bilat Standing with 4# on bilat ankles: hip abduction, hip extension, high marching.  2x10 each bilat Leg Press (seat at 9) 125# 2x10 Seated hamstring stretch 2x20 sec bilat Alt LE toe taps to 2nd step (12 inches) without UE support 2x10 Step downs from 6" step x10 bilat Standing tandem stance at barre for balance, as needed, x20 sec bilat Marching on trampoline with UE assist x2 min   04/22/2023 Nustep level 5 x6 min with PT present to discuss status Sit to/from stand on chair with foam pad to elevate surface 2x5 without UE support 6 minute walk test 955 ft Seated LAQ and marching with 4#  on ankles 2x10 each bilat Standing with 4# on bilat ankles: hip abduction, hip extension, high marching.  2x10 each bilat Alt LE toe taps to 2nd step (12 inches) without UE support 2x10 Step downs from 6" step x10 bilat Leg Press (seat at 9) 120# 2x10 Seated hamstring stretch 2x20 sec bilat Standing rocker board for DF/PF x2 min    PATIENT EDUCATION:  Education details: aquatic exercise technique and rationale Person educated: Patient and Spouse Education method: Programmer, multimedia, Facilities manager,  Education comprehension: verbalized understanding  HOME EXERCISE PROGRAM: Access Code: [email or web address] URL: [email or web address] Date: 01/10/2023 Prepared by: Clydie Braun Khushbu Pippen  Exercises - Sit to Stand  - 1 x daily - 7 x weekly - 2 sets - 10 reps - Standing Hip Abduction with Counter Support  - 1 x daily - 7 x weekly - 2 sets - 10 reps - Standing Hip Extension with Counter Support  - 1 x daily - 7 x weekly - 2 sets - 10 reps -  Standing March with Counter Support  - 1 x daily - 7 x weekly - 2 sets - 10 reps - Heel Raises with Counter Support  - 1 x daily - 7 x weekly - 2 sets - 10 reps  ASSESSMENT:  CLINICAL IMPRESSION:  Mr Bosio presents to PT without any new complaints of falling.  Patient underwent reassessment visit today.  Pt with a slower time noted on 5 times sit to stand, however, he did have an improved BERG and improved score on the lower extremity functional scale (LEFS).  Patient has met the goal for LEFS at this time and nearly met BERG goal.  Patient continues to progress towards improved balance and functional strength.  Patient continues with difficulty with sit to stand transfer, especially from lower height surfaces.  Patient has not yet met all goals and would continue to benefit from outpatient PT of 2x/week for 8 weeks to include a mixture of land-based and aquatic based PT to continue to decrease risk of falling.  OBJECTIVE IMPAIRMENTS: decreased balance, difficulty walking, decreased strength, impaired flexibility, postural dysfunction, and pain.   ACTIVITY LIMITATIONS: carrying, lifting, bending, standing, squatting, and stairs  PARTICIPATION LIMITATIONS: cleaning and community activity  PERSONAL FACTORS: Past/current experiences, Time since onset of injury/illness/exacerbation, and 3+ comorbidities: subdural hematoma, HTN, OA  are also affecting patient's functional outcome.   REHAB POTENTIAL: Good  CLINICAL DECISION MAKING: Evolving/moderate complexity  EVALUATION COMPLEXITY: Moderate   GOALS: Goals reviewed with patient? Yes  SHORT TERM GOALS: Target date: 02/01/2023 Pt will be independent with initial HEP. Baseline: Goal status: MET  2.  Pt will improve ambulation safety with least restrictive assistive device to allow him to safely ambulate distances required for granddaughter's wedding on 02/02/2023. Baseline:  Goal status: Met   LONG TERM GOALS: Target date: 06/28/2023 (Goals  updated on 03/05/2023)  Pt will be independent with advanced HEP to allow for self progression post discharge. Baseline:  Goal status: Ongoing  2.  Patient will increase Lower Extremity Functional Scale to at least 50% to demonstrate improvement in functional tasks. Baseline: 25% Goal status: MET on 04/29/2023  3.  Patient will increase BERG to at least 42/56 to demonstrate decreased risk of falling. Baseline: 27/56 Goal status: Ongoing (see above)  4.  Pt will report being able to ambulate at least 20 minutes with LRAD without increased pain and no loss of balance to allow for community ambulation. Baseline:  Goal status: Ongoing  5.  Pt will increase LE strength to  at least 4+/5 to allow him to perform sit to stand without UE use and navigate a curb with LRAD safely. Baseline:  Goal status: Ongoing   PLAN:  PT FREQUENCY: 2x/week  PT DURATION: 8 weeks  PLANNED INTERVENTIONS: Therapeutic exercises, Therapeutic activity, Neuromuscular re-education, Balance training, Gait training, Patient/Family education, Self Care, Joint mobilization, Joint manipulation, Stair training, Vestibular training, Canalith repositioning, Aquatic Therapy, Dry Needling, Electrical stimulation, Spinal manipulation, Spinal mobilization, Cryotherapy, Moist heat, Taping, Traction, Ultrasound, Ionotophoresis 4mg /ml Dexamethasone, Manual therapy, and Re-evaluation  PLAN FOR NEXT SESSION: Strengthening, balance, flexibility, focus on proximal hip and core strength, aquatics    Reather Laurence, PT, DPT 04/29/23, 1:17 PM  [facility] [address] [place], [place] [address] Phone # [phone] Fax [phone]

## 2023-05-01 ENCOUNTER — Encounter: Payer: Self-pay | Admitting: Rehabilitative and Restorative Service Providers"

## 2023-05-01 ENCOUNTER — Ambulatory Visit: Payer: Medicare HMO | Admitting: Rehabilitative and Restorative Service Providers"

## 2023-05-01 DIAGNOSIS — R2689 Other abnormalities of gait and mobility: Secondary | ICD-10-CM | POA: Diagnosis not present

## 2023-05-01 DIAGNOSIS — R293 Abnormal posture: Secondary | ICD-10-CM | POA: Diagnosis not present

## 2023-05-01 DIAGNOSIS — R262 Difficulty in walking, not elsewhere classified: Secondary | ICD-10-CM

## 2023-05-01 DIAGNOSIS — M6281 Muscle weakness (generalized): Secondary | ICD-10-CM

## 2023-05-01 DIAGNOSIS — R2681 Unsteadiness on feet: Secondary | ICD-10-CM | POA: Diagnosis not present

## 2023-05-01 NOTE — Therapy (Signed)
OUTPATIENT PHYSICAL THERAPY TREATMENT NOTE  Patient Name: Brandon [name] MRN: [id number] DOB:[birth year], 77 y.o., male Today's Date: 05/01/2023    END OF SESSION:  PT End of Session - 05/01/23 1232     Visit Number 23    Date for PT Re-Evaluation 06/28/23    Authorization Type [employer]    Authorization Time Period 04/22/2023 - 06/28/2023    Authorization - Visit Number 4    Authorization - Number of Visits 20    Progress Note Due on Visit 32    PT Start Time 1230    PT Stop Time 1310    PT Time Calculation (min) 40 min    Activity Tolerance Patient tolerated treatment well    Behavior During Therapy WFL for tasks assessed/performed                 Past Medical History:  Diagnosis Date   Allergy    Basal cell carcinoma    GERD (gastroesophageal reflux disease)    History of kidney cancer    benign; told not cancerous   Hypertension    Past Surgical History:  Procedure Laterality Date   CATARACT EXTRACTION Bilateral    CRANIOTOMY Bilateral 12/25/2021   Procedure: CRANIOTOMY HEMATOMA EVACUATION SUBDURAL;  Surgeon: Julio Sicks, MD;  Location: [facility] OR;  Service: Neurosurgery;  Laterality: Bilateral;   HERNIA REPAIR     x2   KIDNEY SURGERY Right    Had right kidney removed.    TONSILLECTOMY     VASECTOMY     Patient Active Problem List   Diagnosis Date Noted   Cognitive and neurobehavioral dysfunction following brain injury (HCC) 02/07/2022   Gait difficulty 02/07/2022   Traumatic brain injury with loss of consciousness (HCC) 12/30/2021   SDH (subdural hematoma) (HCC) 12/24/2021   Hyperlipidemia 02/13/2018   BPH (benign prostatic hyperplasia) 01/31/2017   Heart murmur 01/20/2016   HTN (hypertension) 03/09/2012    PCP: Shirline Frees, NP  REFERRING PROVIDER: Shirline Frees, NP  REFERRING DIAG: R26.81 (ICD-10-CM) - Gait instability  THERAPY DIAG:  Balance problem  Muscle weakness (generalized)  Unsteadiness on feet  Difficulty in walking,  not elsewhere classified  Abnormal posture  Rationale for Evaluation and Treatment: Rehabilitation  ONSET DATE: Pt and wife report that frequent falls have been occurring for the past year  SUBJECTIVE:   SUBJECTIVE STATEMENT:  Pt denies any new falls, states that his knee pain is still 4/10.  PERTINENT HISTORY: Subdural Hematoma, HTN, GERD, Basal Cell Carcinoma, Hx of benign kidney mass, OA  PAIN:  Are you having pain? Yes: NPRS scale: 4/10 Pain location: bilateral knees (right greater than left) Pain description: sore Aggravating factors: unknown Relieving factors: Voltaren gel  PRECAUTIONS: Fall  RED FLAGS: None   WEIGHT BEARING RESTRICTIONS: No  FALLS:  Has patient fallen in last 6 months? Yes. Number of falls 3  LIVING ENVIRONMENT: Lives with: lives with their spouse Lives in: House/apartment Stairs:  one level home, one step to enter porch Has following equipment at home: Single point cane, Environmental consultant - 2 wheeled, Environmental consultant - 4 wheeled, shower chair, and Grab bars  OCCUPATION: retired  PLOF: Requires assistive device for independence and Leisure: walking with wife, going out to eat, movies  PATIENT GOALS: To be able to walk into wedding venue for granddaughter's wedding on September 21st, 2024.  NEXT MD VISIT: Shirline Frees on 07/01/2022  OBJECTIVE:   DIAGNOSTIC FINDINGS: Head CT on 04/27/2023: IMPRESSION: 1. Stable 6 mm acute on chronic left  subdural hematoma. No associated midline shift. 2. Stable 6 mm chronic right subdural hematoma. 3. No new intracranial abnormality. 4. No acute displaced fracture or traumatic listhesis of the cervical spine. 5. Persistent right apical subpleural 1.5 cm ground-glass nodular-like airspace opacity (increased in size from 2018). Adenocarcinoma is not excluded. Additional imaging evaluation or consultation with Pulmonology or Thoracic Surgery recommended.  PATIENT SURVEYS:  Eval:  LEFS 20 / 80 = 25.0 % 03/05/2023:  Lower  Extremity Functional Score: 35 / 80 = 43.8 % 04/29/2023:  Lower Extremity Functional Score: 59 / 80 = 73.8 %  COGNITION: Overall cognitive status:  wife reports occasionally pt has word finding problems and some memory impairments      SENSATION: Wife reports that sometimes left hand has "spasms"  MUSCLE LENGTH: Hamstrings: Tightness bilaterally  POSTURE: rounded shoulders, forward head, and flexed trunk    LOWER EXTREMITY ROM:  WFL  LOWER EXTREMITY MMT:  Eval: Right LE strength grossly 4-/5 Left LE strength grossly 4 to 4+/5 throughout  FUNCTIONAL TESTS:  Eval:   5 times sit to stand: 14.96 sec with UE use required Timed up and go (TUG): 13.71 sec without UE device with unsteadiness noted especially with 180 degree turn BERG:  27/56  01/24/2023: 3 minute walk:  378 ft with SPC (pt with shuffling gait pattern)  02/26/2023: Timed up and go (TUG): 11.69 sec 5 times sit to stand: 13.28 sec with UE use required 3 minute walk: 470 ft without assistive device  03/05/2023: BERG: 39/56  04/02/2023: 3 minute walk: 454 ft  04/09/2023: Timed up and go (TUG): 10.16 sec 5 times sit to stand: 15.81 sec first time, 13.94 sec second time 3 minute walk: 499 ft  04/22/2023: 6 minute walk:  955 ft  04/29/2023: 5 times sit to stand: 18.61 sec first test, 16.99 sec second time BERG: 41/56 6 minute walk: 980 ft  GAIT: Distance walked: >200 ft Assistive device utilized: Single point cane Level of assistance: SBA Comments: Pt with unsteady gait noted and requires SBA   TODAY'S TREATMENT:     DATE: 05/01/2023 Nustep level 5 x6 min with PT present to discuss status Sit to/from stand on chair with foam pad to elevate surface 2x6 without UE support Seated LAQ, marching, and hip ER with 5# on ankles 2x10 each bilat Standing with 5# on bilat ankles: hip abduction, hip extension, high marching.  2x10 each bilat Ambulation with 5# on bilat ankles down to cancer gym and back x2  with seated recovery period in between Leg Press (seat at 9) 125# 2x10   DATE: 04/29/2023 Nustep level 5 x6 min with PT present to discuss status Sit to/from stand 2x5 with UE support (no foam pad) Seated LAQ and marching with 5# on ankles 2x10 each bilat Standing with 5# on bilat ankles: hip abduction, hip extension, high marching.  2x10 each bilat Seated hamstring stretch 2x20 sec bilat Lower Extremity Functional Score: 59 / 80 = 73.8 % Leg Press (seat at 9) 125# 2x10   DATE: 04/25/2023 Nustep level 5 x6 min with PT present to discuss status Sit to/from stand on chair with foam pad to elevate surface 2x6 without UE support Seated LAQ and marching with 4# on ankles 2x10 each bilat Standing with 4# on bilat ankles: hip abduction, hip extension, high marching.  2x10 each bilat Leg Press (seat at 9) 125# 2x10 Seated hamstring stretch 2x20 sec bilat Alt LE toe taps to 2nd step (12 inches) without UE support 2x10 Step  downs from 6" step x10 bilat Standing tandem stance at barre for balance, as needed, x20 sec bilat Marching on trampoline with UE assist x2 min     PATIENT EDUCATION:  Education details: aquatic exercise technique and rationale Person educated: Patient and Spouse Education method: Programmer, multimedia, Facilities manager,  Education comprehension: verbalized understanding  HOME EXERCISE PROGRAM: Access Code: [email or web address] URL: [email or web address] Date: 01/10/2023 Prepared by: Clydie Braun Leonidus Rowand  Exercises - Sit to Stand  - 1 x daily - 7 x weekly - 2 sets - 10 reps - Standing Hip Abduction with Counter Support  - 1 x daily - 7 x weekly - 2 sets - 10 reps - Standing Hip Extension with Counter Support  - 1 x daily - 7 x weekly - 2 sets - 10 reps - Standing March with Counter Support  - 1 x daily - 7 x weekly - 2 sets - 10 reps - Heel Raises with Counter Support  - 1 x daily - 7 x weekly - 2 sets - 10 reps  ASSESSMENT:  CLINICAL IMPRESSION:  Mr Rogus presents to PT  without any new complaints of falling and similar knee pain.  Patient continues to progress with ambulation and was able to tolerate ambulation with 5# ankle weights.  Patient did require cuing during ambulation for improved foot clearance to decrease shuffling gait pattern.  Patient continues to progress with overall improved balance and improved functional strength.  OBJECTIVE IMPAIRMENTS: decreased balance, difficulty walking, decreased strength, impaired flexibility, postural dysfunction, and pain.   ACTIVITY LIMITATIONS: carrying, lifting, bending, standing, squatting, and stairs  PARTICIPATION LIMITATIONS: cleaning and community activity  PERSONAL FACTORS: Past/current experiences, Time since onset of injury/illness/exacerbation, and 3+ comorbidities: subdural hematoma, HTN, OA  are also affecting patient's functional outcome.   REHAB POTENTIAL: Good  CLINICAL DECISION MAKING: Evolving/moderate complexity  EVALUATION COMPLEXITY: Moderate   GOALS: Goals reviewed with patient? Yes  SHORT TERM GOALS: Target date: 02/01/2023 Pt will be independent with initial HEP. Baseline: Goal status: MET  2.  Pt will improve ambulation safety with least restrictive assistive device to allow him to safely ambulate distances required for granddaughter's wedding on 02/02/2023. Baseline:  Goal status: Met   LONG TERM GOALS: Target date: 06/28/2023 (Goals updated on 03/05/2023)  Pt will be independent with advanced HEP to allow for self progression post discharge. Baseline:  Goal status: Ongoing  2.  Patient will increase Lower Extremity Functional Scale to at least 50% to demonstrate improvement in functional tasks. Baseline: 25% Goal status: MET on 04/29/2023  3.  Patient will increase BERG to at least 42/56 to demonstrate decreased risk of falling. Baseline: 27/56 Goal status: Ongoing (see above)  4.  Pt will report being able to ambulate at least 20 minutes with LRAD without increased  pain and no loss of balance to allow for community ambulation. Baseline:  Goal status: Ongoing  5.  Pt will increase LE strength to at least 4+/5 to allow him to perform sit to stand without UE use and navigate a curb with LRAD safely. Baseline:  Goal status: Ongoing   PLAN:  PT FREQUENCY: 2x/week  PT DURATION: 8 weeks  PLANNED INTERVENTIONS: Therapeutic exercises, Therapeutic activity, Neuromuscular re-education, Balance training, Gait training, Patient/Family education, Self Care, Joint mobilization, Joint manipulation, Stair training, Vestibular training, Canalith repositioning, Aquatic Therapy, Dry Needling, Electrical stimulation, Spinal manipulation, Spinal mobilization, Cryotherapy, Moist heat, Taping, Traction, Ultrasound, Ionotophoresis 4mg /ml Dexamethasone, Manual therapy, and Re-evaluation  PLAN FOR NEXT SESSION: Strengthening, balance, flexibility, focus on proximal hip  and core strength, aquatics    Dixon, PT, DPT 05/01/23, 1:18 PM  [facility] [address], [address] [place], [place] [address] Phone # [phone] Fax [phone]

## 2023-05-09 NOTE — Therapy (Unsigned)
OUTPATIENT PHYSICAL THERAPY TREATMENT NOTE  Patient Name: Brandon [name] MRN: [id number] DOB:11-08-1945, 77 y.o., male Today's Date: 05/11/2023    END OF SESSION:  PT End of Session - 05/10/23 1613     Visit Number 24    Date for PT Re-Evaluation 06/28/23    Authorization Type [employer]    Authorization Time Period 04/22/2023 - 06/28/2023    Authorization - Visit Number 5    Authorization - Number of Visits 20    Progress Note Due on Visit 32    PT Start Time 1515    PT Stop Time 1555    PT Time Calculation (min) 40 min                  Past Medical History:  Diagnosis Date   Allergy    Basal cell carcinoma    GERD (gastroesophageal reflux disease)    History of kidney cancer    benign; told not cancerous   Hypertension    Past Surgical History:  Procedure Laterality Date   CATARACT EXTRACTION Bilateral    CRANIOTOMY Bilateral 12/25/2021   Procedure: CRANIOTOMY HEMATOMA EVACUATION SUBDURAL;  Surgeon: Julio Sicks, MD;  Location: [facility] OR;  Service: Neurosurgery;  Laterality: Bilateral;   HERNIA REPAIR     x2   KIDNEY SURGERY Right    Had right kidney removed.    TONSILLECTOMY     VASECTOMY     Patient Active Problem List   Diagnosis Date Noted   Cognitive and neurobehavioral dysfunction following brain injury (HCC) 02/07/2022   Gait difficulty 02/07/2022   Traumatic brain injury with loss of consciousness (HCC) 12/30/2021   SDH (subdural hematoma) (HCC) 12/24/2021   Hyperlipidemia 02/13/2018   BPH (benign prostatic hyperplasia) 01/31/2017   Heart murmur 01/20/2016   HTN (hypertension) 03/09/2012    PCP: Shirline Frees, NP  REFERRING PROVIDER: Shirline Frees, NP  REFERRING DIAG: R26.81 (ICD-10-CM) - Gait instability  THERAPY DIAG:  Muscle weakness (generalized)  Balance problem  Unsteadiness on feet  Difficulty in walking, not elsewhere classified  Abnormal posture  Rationale for Evaluation and Treatment: Rehabilitation  ONSET DATE:  Pt and wife report that frequent falls have been occurring for the past year  SUBJECTIVE:   SUBJECTIVE STATEMENT: No new complaints. Right knee still bothersome.  PERTINENT HISTORY: Subdural Hematoma, HTN, GERD, Basal Cell Carcinoma, Hx of benign kidney mass, OA  PAIN:  Are you having pain? Yes: NPRS scale: 4/10 Pain location: bilateral knees (right greater than left) Pain description: sore Aggravating factors: unknown Relieving factors: Voltaren gel  PRECAUTIONS: Fall  RED FLAGS: None   WEIGHT BEARING RESTRICTIONS: No  FALLS:  Has patient fallen in last 6 months? Yes. Number of falls 3  LIVING ENVIRONMENT: Lives with: lives with their spouse Lives in: House/apartment Stairs:  one level home, one step to enter porch Has following equipment at home: Single point cane, Environmental consultant - 2 wheeled, Environmental consultant - 4 wheeled, shower chair, and Grab bars  OCCUPATION: retired  PLOF: Requires assistive device for independence and Leisure: walking with wife, going out to eat, movies  PATIENT GOALS: To be able to walk into wedding venue for granddaughter's wedding on September 21st, 2024.  NEXT MD VISIT: Shirline Frees on 07/01/2022  OBJECTIVE:   DIAGNOSTIC FINDINGS: Head CT on 04/27/2023: IMPRESSION: 1. Stable 6 mm acute on chronic left subdural hematoma. No associated midline shift. 2. Stable 6 mm chronic right subdural hematoma. 3. No new intracranial abnormality. 4. No acute displaced fracture or  traumatic listhesis of the cervical spine. 5. Persistent right apical subpleural 1.5 cm ground-glass nodular-like airspace opacity (increased in size from 2018). Adenocarcinoma is not excluded. Additional imaging evaluation or consultation with Pulmonology or Thoracic Surgery recommended.  PATIENT SURVEYS:  Eval:  LEFS 20 / 80 = 25.0 % 03/05/2023:  Lower Extremity Functional Score: 35 / 80 = 43.8 % 04/29/2023:  Lower Extremity Functional Score: 59 / 80 = 73.8 %  COGNITION: Overall  cognitive status:  wife reports occasionally pt has word finding problems and some memory impairments      SENSATION: Wife reports that sometimes left hand has "spasms"  MUSCLE LENGTH: Hamstrings: Tightness bilaterally  POSTURE: rounded shoulders, forward head, and flexed trunk    LOWER EXTREMITY ROM:  WFL  LOWER EXTREMITY MMT:  Eval: Right LE strength grossly 4-/5 Left LE strength grossly 4 to 4+/5 throughout  FUNCTIONAL TESTS:  Eval:   5 times sit to stand: 14.96 sec with UE use required Timed up and go (TUG): 13.71 sec without UE device with unsteadiness noted especially with 180 degree turn BERG:  27/56  01/24/2023: 3 minute walk:  378 ft with SPC (pt with shuffling gait pattern)  02/26/2023: Timed up and go (TUG): 11.69 sec 5 times sit to stand: 13.28 sec with UE use required 3 minute walk: 470 ft without assistive device  03/05/2023: BERG: 39/56  04/02/2023: 3 minute walk: 454 ft  04/09/2023: Timed up and go (TUG): 10.16 sec 5 times sit to stand: 15.81 sec first time, 13.94 sec second time 3 minute walk: 499 ft  04/22/2023: 6 minute walk:  955 ft  04/29/2023: 5 times sit to stand: 18.61 sec first test, 16.99 sec second time BERG: 41/56 6 minute walk: 980 ft  GAIT: Distance walked: >200 ft Assistive device utilized: Single point cane Level of assistance: SBA Comments: Pt with unsteady gait noted and requires SBA   TODAY'S TREATMENT:     05/10/23:Pt arrives for aquatic physical therapy. Treatment took place in 3.5-5.5 feet of water. Water temperature was 91 degrees F. Pt entered the pool via stairs step to step with heavy use of the rails. Pt requires buoyancy of water for support and to offload joints with strengthening exercises.   Seated water bench with 75% submersion Pt performed seated LE AROM exercises with concurrent discussion of current status and pain assessment. 75% depth water walking with yellow noodle 10x each direction, some LOB but  pt could self correct. Hip 3 ways 15x Bil with UE assit on side of pool  High knee forward marching 6 lengths of pool holding on the yellow noodle. Tandem walking (slightly wider than heel-toe to increase success) 4 lengths holding hand floats. Static stance with small step forward and UE horizontal adb/add with green water bells 2x10 Bil then forward and back 2x10. RTLE step ups 2x15, short seated rest break d/t LE fatigue.                                                                                                        DATE: 05/01/2023 Nustep  level 5 x6 min with PT present to discuss status Sit to/from stand on chair with foam pad to elevate surface 2x6 without UE support Seated LAQ, marching, and hip ER with 5# on ankles 2x10 each bilat Standing with 5# on bilat ankles: hip abduction, hip extension, high marching.  2x10 each bilat Ambulation with 5# on bilat ankles down to cancer gym and back x2 with seated recovery period in between Leg Press (seat at 9) 125# 2x10     PATIENT EDUCATION:  Education details: aquatic exercise technique and rationale Person educated: Patient and Spouse Education method: Explanation, Facilities manager,  Education comprehension: verbalized understanding  HOME EXERCISE PROGRAM: Access Code: [email or web address] URL: [email or web address] Date: 01/10/2023 Prepared by: Clydie Braun Menke  Exercises - Sit to Stand  - 1 x daily - 7 x weekly - 2 sets - 10 reps - Standing Hip Abduction with Counter Support  - 1 x daily - 7 x weekly - 2 sets - 10 reps - Standing Hip Extension with Counter Support  - 1 x daily - 7 x weekly - 2 sets - 10 reps - Standing March with Counter Support  - 1 x daily - 7 x weekly - 2 sets - 10 reps - Heel Raises with Counter Support  - 1 x daily - 7 x weekly - 2 sets - 10 reps  ASSESSMENT:  CLINICAL IMPRESSION: Pt continues to have challenges with his balance in narrow base of support activities. Aquatic environment assists and prevents  pt falling. No increase in Rt knee pain. .  OBJECTIVE IMPAIRMENTS: decreased balance, difficulty walking, decreased strength, impaired flexibility, postural dysfunction, and pain.   ACTIVITY LIMITATIONS: carrying, lifting, bending, standing, squatting, and stairs  PARTICIPATION LIMITATIONS: cleaning and community activity  PERSONAL FACTORS: Past/current experiences, Time since onset of injury/illness/exacerbation, and 3+ comorbidities: subdural hematoma, HTN, OA  are also affecting patient's functional outcome.   REHAB POTENTIAL: Good  CLINICAL DECISION MAKING: Evolving/moderate complexity  EVALUATION COMPLEXITY: Moderate   GOALS: Goals reviewed with patient? Yes  SHORT TERM GOALS: Target date: 02/01/2023 Pt will be independent with initial HEP. Baseline: Goal status: MET  2.  Pt will improve ambulation safety with least restrictive assistive device to allow him to safely ambulate distances required for granddaughter's wedding on 02/02/2023. Baseline:  Goal status: Met   LONG TERM GOALS: Target date: 06/28/2023 (Goals updated on 03/05/2023)  Pt will be independent with advanced HEP to allow for self progression post discharge. Baseline:  Goal status: Ongoing  2.  Patient will increase Lower Extremity Functional Scale to at least 50% to demonstrate improvement in functional tasks. Baseline: 25% Goal status: MET on 04/29/2023  3.  Patient will increase BERG to at least 42/56 to demonstrate decreased risk of falling. Baseline: 27/56 Goal status: Ongoing (see above)  4.  Pt will report being able to ambulate at least 20 minutes with LRAD without increased pain and no loss of balance to allow for community ambulation. Baseline:  Goal status: Ongoing  5.  Pt will increase LE strength to at least 4+/5 to allow him to perform sit to stand without UE use and navigate a curb with LRAD safely. Baseline:  Goal status: Ongoing   PLAN:  PT FREQUENCY: 2x/week  PT DURATION: 8  weeks  PLANNED INTERVENTIONS: Therapeutic exercises, Therapeutic activity, Neuromuscular re-education, Balance training, Gait training, Patient/Family education, Self Care, Joint mobilization, Joint manipulation, Stair training, Vestibular training, Canalith repositioning, Aquatic Therapy, Dry Needling, Electrical stimulation, Spinal manipulation, Spinal mobilization, Cryotherapy, Moist heat, Taping, Traction,  Ultrasound, Ionotophoresis 4mg /ml Dexamethasone, Manual therapy, and Re-evaluation  PLAN FOR NEXT SESSION: Strengthening, balance, flexibility, focus on proximal hip and core strength, aquatics    Ane Payment, PTA 05/11/23 11:34 AM   [facility] [address], [address] [place], [place] [address] Phone # [phone] Fax [phone]

## 2023-05-10 ENCOUNTER — Encounter: Payer: Self-pay | Admitting: Physical Therapy

## 2023-05-10 ENCOUNTER — Ambulatory Visit: Payer: Medicare HMO | Admitting: Physical Therapy

## 2023-05-10 DIAGNOSIS — M6281 Muscle weakness (generalized): Secondary | ICD-10-CM

## 2023-05-10 DIAGNOSIS — R262 Difficulty in walking, not elsewhere classified: Secondary | ICD-10-CM | POA: Diagnosis not present

## 2023-05-10 DIAGNOSIS — R2681 Unsteadiness on feet: Secondary | ICD-10-CM

## 2023-05-10 DIAGNOSIS — R293 Abnormal posture: Secondary | ICD-10-CM | POA: Diagnosis not present

## 2023-05-10 DIAGNOSIS — R2689 Other abnormalities of gait and mobility: Secondary | ICD-10-CM | POA: Diagnosis not present

## 2023-05-11 ENCOUNTER — Other Ambulatory Visit: Payer: Self-pay | Admitting: Adult Health

## 2023-05-11 DIAGNOSIS — I10 Essential (primary) hypertension: Secondary | ICD-10-CM

## 2023-05-16 NOTE — Therapy (Signed)
 OUTPATIENT PHYSICAL THERAPY TREATMENT NOTE  Patient Name: Brandon [name] MRN: [id number] DOB:Oct 07, 1945, 77 y.o., male Today's Date: 05/17/2023    END OF SESSION:  PT End of Session - 05/17/23 1428     Visit Number 25    Date for PT Re-Evaluation 06/28/23    Authorization Type [employer]    Authorization Time Period 04/22/2023 - 06/28/2023    Authorization - Visit Number 6    Authorization - Number of Visits 20    Progress Note Due on Visit 32    PT Start Time 1430    PT Stop Time 1510    PT Time Calculation (min) 40 min    Activity Tolerance Patient tolerated treatment well    Behavior During Therapy WFL for tasks assessed/performed                   Past Medical History:  Diagnosis Date   Allergy    Basal cell carcinoma    GERD (gastroesophageal reflux disease)    History of kidney cancer    benign; told not cancerous   Hypertension    Past Surgical History:  Procedure Laterality Date   CATARACT EXTRACTION Bilateral    CRANIOTOMY Bilateral 12/25/2021   Procedure: CRANIOTOMY HEMATOMA EVACUATION SUBDURAL;  Surgeon: Louis Shove, MD;  Location: [facility] OR;  Service: Neurosurgery;  Laterality: Bilateral;   HERNIA REPAIR     x2   KIDNEY SURGERY Right    Had right kidney removed.    TONSILLECTOMY     VASECTOMY     Patient Active Problem List   Diagnosis Date Noted   Cognitive and neurobehavioral dysfunction following brain injury (HCC) 02/07/2022   Gait difficulty 02/07/2022   Traumatic brain injury with loss of consciousness (HCC) 12/30/2021   SDH (subdural hematoma) (HCC) 12/24/2021   Hyperlipidemia 02/13/2018   BPH (benign prostatic hyperplasia) 01/31/2017   Heart murmur 01/20/2016   HTN (hypertension) 03/09/2012    PCP: Merna Huxley, NP  REFERRING PROVIDER: Merna Huxley, NP  REFERRING DIAG: R26.81 (ICD-10-CM) - Gait instability  THERAPY DIAG:  Muscle weakness (generalized)  Balance problem  Unsteadiness on feet  Difficulty in walking,  not elsewhere classified  Abnormal posture  Rationale for Evaluation and Treatment: Rehabilitation  ONSET DATE: Pt and wife report that frequent falls have been occurring for the past year  SUBJECTIVE:   SUBJECTIVE STATEMENT: Bought a knee sleeve for my right knee and I think wearing it is helpful. No complaints after last pool session.  PERTINENT HISTORY: Subdural Hematoma, HTN, GERD, Basal Cell Carcinoma, Hx of benign kidney mass, OA  PAIN:  Are you having pain? Yes: NPRS scale: 1-2/10 Pain location: bilateral knees (right greater than left) Pain description: sore Aggravating factors: unknown Relieving factors: Voltaren  gel  PRECAUTIONS: Fall  RED FLAGS: None   WEIGHT BEARING RESTRICTIONS: No  FALLS:  Has patient fallen in last 6 months? Yes. Number of falls 3  LIVING ENVIRONMENT: Lives with: lives with their spouse Lives in: House/apartment Stairs:  one level home, one step to enter porch Has following equipment at home: Single point cane, Environmental Consultant - 2 wheeled, Environmental Consultant - 4 wheeled, shower chair, and Grab bars  OCCUPATION: retired  PLOF: Requires assistive device for independence and Leisure: walking with wife, going out to eat, movies  PATIENT GOALS: To be able to walk into wedding venue for granddaughter's wedding on September 21st, 2024.  NEXT MD VISIT: Huxley Merna on 07/01/2022  OBJECTIVE:   DIAGNOSTIC FINDINGS: Head CT on 04/27/2023:  IMPRESSION: 1. Stable 6 mm acute on chronic left subdural hematoma. No associated midline shift. 2. Stable 6 mm chronic right subdural hematoma. 3. No new intracranial abnormality. 4. No acute displaced fracture or traumatic listhesis of the cervical spine. 5. Persistent right apical subpleural 1.5 cm ground-glass nodular-like airspace opacity (increased in size from 2018). Adenocarcinoma is not excluded. Additional imaging evaluation or consultation with Pulmonology or Thoracic Surgery recommended.  PATIENT SURVEYS:   Eval:  LEFS 20 / 80 = 25.0 % 03/05/2023:  Lower Extremity Functional Score: 35 / 80 = 43.8 % 04/29/2023:  Lower Extremity Functional Score: 59 / 80 = 73.8 %  COGNITION: Overall cognitive status:  wife reports occasionally pt has word finding problems and some memory impairments      SENSATION: Wife reports that sometimes left hand has spasms  MUSCLE LENGTH: Hamstrings: Tightness bilaterally  POSTURE: rounded shoulders, forward head, and flexed trunk    LOWER EXTREMITY ROM:  WFL  LOWER EXTREMITY MMT:  Eval: Right LE strength grossly 4-/5 Left LE strength grossly 4 to 4+/5 throughout  FUNCTIONAL TESTS:  Eval:   5 times sit to stand: 14.96 sec with UE use required Timed up and go (TUG): 13.71 sec without UE device with unsteadiness noted especially with 180 degree turn BERG:  27/56  01/24/2023: 3 minute walk:  378 ft with SPC (pt with shuffling gait pattern)  02/26/2023: Timed up and go (TUG): 11.69 sec 5 times sit to stand: 13.28 sec with UE use required 3 minute walk: 470 ft without assistive device  03/05/2023: BERG: 39/56  04/02/2023: 3 minute walk: 454 ft  04/09/2023: Timed up and go (TUG): 10.16 sec 5 times sit to stand: 15.81 sec first time, 13.94 sec second time 3 minute walk: 499 ft  04/22/2023: 6 minute walk:  955 ft  04/29/2023: 5 times sit to stand: 18.61 sec first test, 16.99 sec second time BERG: 41/56 6 minute walk: 980 ft  GAIT: Distance walked: >200 ft Assistive device utilized: Single point cane Level of assistance: SBA Comments: Pt with unsteady gait noted and requires SBA   TODAY'S TREATMENT:     05/17/23:Pt arrives for aquatic physical therapy. Treatment took place in 3.5-5.5 feet of water. Water temperature was 91 degrees F. Pt entered the pool via stairs step to step with heavy use of the rails. Pt requires buoyancy of water for support and to offload joints with strengthening exercises.   Seated water bench with 75%  submersion Pt performed seated LE AROM exercises with concurrent discussion of current status and pain assessment. 75% depth water walking with yellow noodle 10x each direction, some LOB but pt could self correct. Hip 3 ways 15x Bil with UE assit on side of pool  High knee forward marching 6 lengths of pool holding on the yellow noodle. Tandem walking (slightly wider than heel-toe to increase success) 4 lengths holding hand floats. Static stance with small step forward and UE horizontal adb/add with green water bells 2x10 Bil then forward and back 2x10. RTLE step ups 2x15, short seated rest break d/t LE fatigue.                                 05/10/23:Pt arrives for aquatic physical therapy. Treatment took place in 3.5-5.5 feet of water. Water temperature was 91 degrees F. Pt entered the pool via stairs step to step with heavy use of the rails. Pt requires buoyancy of water  for support and to offload joints with strengthening exercises.   Seated water bench with 75% submersion Pt performed seated LE AROM exercises with concurrent discussion of current status and pain assessment. 75% depth water walking with yellow noodle 10x each direction, some LOB but pt could self correct. Hip 3 ways 15x Bil with UE assit on side of pool  High knee forward marching 6 lengths of pool holding on the yellow noodle. Tandem walking (slightly wider than heel-toe to increase success) 4 lengths holding hand floats. Static stance with small step forward and UE horizontal adb/add with green water bells 2x10 Bil then forward and back 2x10. RTLE step ups 2x15, short seated rest break d/t LE fatigue.                                                                                                        DATE: 05/01/2023 Nustep level 5 x6 min with PT present to discuss status Sit to/from stand on chair with foam pad to elevate surface 2x6 without UE support Seated LAQ, marching, and hip ER with 5# on ankles 2x10 each  bilat Standing with 5# on bilat ankles: hip abduction, hip extension, high marching.  2x10 each bilat Ambulation with 5# on bilat ankles down to cancer gym and back x2 with seated recovery period in between Leg Press (seat at 9) 125# 2x10     PATIENT EDUCATION:  Education details: aquatic exercise technique and rationale Person educated: Patient and Spouse Education method: Explanation, Facilities Manager,  Education comprehension: verbalized understanding  HOME EXERCISE PROGRAM: Access Code: [email or web address] URL: [email or web address] Date: 01/10/2023 Prepared by: Jarrell Menke  Exercises - Sit to Stand  - 1 x daily - 7 x weekly - 2 sets - 10 reps - Standing Hip Abduction with Counter Support  - 1 x daily - 7 x weekly - 2 sets - 10 reps - Standing Hip Extension with Counter Support  - 1 x daily - 7 x weekly - 2 sets - 10 reps - Standing March with Counter Support  - 1 x daily - 7 x weekly - 2 sets - 10 reps - Heel Raises with Counter Support  - 1 x daily - 7 x weekly - 2 sets - 10 reps  ASSESSMENT:  CLINICAL IMPRESSION: Probably pt's best session in regards to his balance and overall steadiness. Much improvement with narrow base activities.   OBJECTIVE IMPAIRMENTS: decreased balance, difficulty walking, decreased strength, impaired flexibility, postural dysfunction, and pain.   ACTIVITY LIMITATIONS: carrying, lifting, bending, standing, squatting, and stairs  PARTICIPATION LIMITATIONS: cleaning and community activity  PERSONAL FACTORS: Past/current experiences, Time since onset of injury/illness/exacerbation, and 3+ comorbidities: subdural hematoma, HTN, OA  are also affecting patient's functional outcome.   REHAB POTENTIAL: Good  CLINICAL DECISION MAKING: Evolving/moderate complexity  EVALUATION COMPLEXITY: Moderate   GOALS: Goals reviewed with patient? Yes  SHORT TERM GOALS: Target date: 02/01/2023 Pt will be independent with initial HEP. Baseline: Goal status:  MET  2.  Pt will improve ambulation safety with least restrictive assistive device to allow him to safely ambulate  distances required for granddaughter's wedding on 02/02/2023. Baseline:  Goal status: Met   LONG TERM GOALS: Target date: 06/28/2023 (Goals updated on 03/05/2023)  Pt will be independent with advanced HEP to allow for self progression post discharge. Baseline:  Goal status: Ongoing  2.  Patient will increase Lower Extremity Functional Scale to at least 50% to demonstrate improvement in functional tasks. Baseline: 25% Goal status: MET on 04/29/2023  3.  Patient will increase BERG to at least 42/56 to demonstrate decreased risk of falling. Baseline: 27/56 Goal status: Ongoing (see above)  4.  Pt will report being able to ambulate at least 20 minutes with LRAD without increased pain and no loss of balance to allow for community ambulation. Baseline:  Goal status: Ongoing  5.  Pt will increase LE strength to at least 4+/5 to allow him to perform sit to stand without UE use and navigate a curb with LRAD safely. Baseline:  Goal status: Ongoing   PLAN:  PT FREQUENCY: 2x/week  PT DURATION: 8 weeks  PLANNED INTERVENTIONS: Therapeutic exercises, Therapeutic activity, Neuromuscular re-education, Balance training, Gait training, Patient/Family education, Self Care, Joint mobilization, Joint manipulation, Stair training, Vestibular training, Canalith repositioning, Aquatic Therapy, Dry Needling, Electrical stimulation, Spinal manipulation, Spinal mobilization, Cryotherapy, Moist heat, Taping, Traction, Ultrasound, Ionotophoresis 4mg /ml Dexamethasone , Manual therapy, and Re-evaluation  PLAN FOR NEXT SESSION: Strengthening, balance, flexibility, focus on proximal hip and core strength, aquatics    Delon Darner, PTA 05/17/23 4:24 PM   [facility] [address] [place], [place] [address] Phone # [phone] Fax [phone]

## 2023-05-17 ENCOUNTER — Encounter: Payer: Self-pay | Admitting: Physical Therapy

## 2023-05-17 ENCOUNTER — Ambulatory Visit: Payer: Medicare HMO | Attending: Adult Health | Admitting: Physical Therapy

## 2023-05-17 DIAGNOSIS — R2689 Other abnormalities of gait and mobility: Secondary | ICD-10-CM | POA: Insufficient documentation

## 2023-05-17 DIAGNOSIS — M6281 Muscle weakness (generalized): Secondary | ICD-10-CM | POA: Insufficient documentation

## 2023-05-17 DIAGNOSIS — R262 Difficulty in walking, not elsewhere classified: Secondary | ICD-10-CM | POA: Diagnosis not present

## 2023-05-17 DIAGNOSIS — R293 Abnormal posture: Secondary | ICD-10-CM | POA: Diagnosis not present

## 2023-05-17 DIAGNOSIS — R2681 Unsteadiness on feet: Secondary | ICD-10-CM | POA: Diagnosis not present

## 2023-05-21 ENCOUNTER — Encounter: Payer: Self-pay | Admitting: Rehabilitative and Restorative Service Providers"

## 2023-05-21 ENCOUNTER — Ambulatory Visit: Payer: Medicare HMO | Admitting: Rehabilitative and Restorative Service Providers"

## 2023-05-21 DIAGNOSIS — R2689 Other abnormalities of gait and mobility: Secondary | ICD-10-CM | POA: Diagnosis not present

## 2023-05-21 DIAGNOSIS — M6281 Muscle weakness (generalized): Secondary | ICD-10-CM | POA: Diagnosis not present

## 2023-05-21 DIAGNOSIS — R293 Abnormal posture: Secondary | ICD-10-CM

## 2023-05-21 DIAGNOSIS — R262 Difficulty in walking, not elsewhere classified: Secondary | ICD-10-CM | POA: Diagnosis not present

## 2023-05-21 DIAGNOSIS — R2681 Unsteadiness on feet: Secondary | ICD-10-CM

## 2023-05-21 NOTE — Therapy (Signed)
 OUTPATIENT PHYSICAL THERAPY TREATMENT NOTE  Patient Name: Brandon [name] MRN: [id number] DOB:1945-10-07, 78 y.o., male Today's Date: 05/21/2023    END OF SESSION:  PT End of Session - 05/21/23 1102     Visit Number 26    Date for PT Re-Evaluation 06/28/23    Authorization Type [employer]    Authorization Time Period 04/22/2023 - 06/28/2023    Authorization - Visit Number 7    Authorization - Number of Visits 20    Progress Note Due on Visit 32    PT Start Time 1100    PT Stop Time 1140    PT Time Calculation (min) 40 min    Activity Tolerance Patient tolerated treatment well    Behavior During Therapy WFL for tasks assessed/performed                   Past Medical History:  Diagnosis Date   Allergy    Basal cell carcinoma    GERD (gastroesophageal reflux disease)    History of kidney cancer    benign; told not cancerous   Hypertension    Past Surgical History:  Procedure Laterality Date   CATARACT EXTRACTION Bilateral    CRANIOTOMY Bilateral 12/25/2021   Procedure: CRANIOTOMY HEMATOMA EVACUATION SUBDURAL;  Surgeon: Louis Shove, MD;  Location: [facility] OR;  Service: Neurosurgery;  Laterality: Bilateral;   HERNIA REPAIR     x2   KIDNEY SURGERY Right    Had right kidney removed.    TONSILLECTOMY     VASECTOMY     Patient Active Problem List   Diagnosis Date Noted   Cognitive and neurobehavioral dysfunction following brain injury (HCC) 02/07/2022   Gait difficulty 02/07/2022   Traumatic brain injury with loss of consciousness (HCC) 12/30/2021   SDH (subdural hematoma) (HCC) 12/24/2021   Hyperlipidemia 02/13/2018   BPH (benign prostatic hyperplasia) 01/31/2017   Heart murmur 01/20/2016   HTN (hypertension) 03/09/2012    PCP: Merna Huxley, NP  REFERRING PROVIDER: Merna Huxley, NP  REFERRING DIAG: R26.81 (ICD-10-CM) - Gait instability  THERAPY DIAG:  Muscle weakness (generalized)  Balance problem  Unsteadiness on feet  Difficulty in walking,  not elsewhere classified  Abnormal posture  Rationale for Evaluation and Treatment: Rehabilitation  ONSET DATE: Pt and wife report that frequent falls have been occurring for the past year  SUBJECTIVE:   SUBJECTIVE STATEMENT:  Pt reports that he was advised to wear a knee brace, states that it seems to have helped some with his knee stability.  PERTINENT HISTORY: Subdural Hematoma, HTN, GERD, Basal Cell Carcinoma, Hx of benign kidney mass, OA  PAIN:  Are you having pain? Yes: NPRS scale: 3/10 Pain location: bilateral knees (right greater than left) Pain description: sore Aggravating factors: unknown Relieving factors: Voltaren  gel  PRECAUTIONS: Fall  RED FLAGS: None   WEIGHT BEARING RESTRICTIONS: No  FALLS:  Has patient fallen in last 6 months? Yes. Number of falls 3  LIVING ENVIRONMENT: Lives with: lives with their spouse Lives in: House/apartment Stairs:  one level home, one step to enter porch Has following equipment at home: Single point cane, Environmental Consultant - 2 wheeled, Environmental Consultant - 4 wheeled, shower chair, and Grab bars  OCCUPATION: retired  PLOF: Requires assistive device for independence and Leisure: walking with wife, going out to eat, movies  PATIENT GOALS: To be able to walk into wedding venue for granddaughter's wedding on September 21st, 2024.  NEXT MD VISIT: Huxley Merna on 07/01/2022  OBJECTIVE:   DIAGNOSTIC FINDINGS: Head  CT on 04/27/2023: IMPRESSION: 1. Stable 6 mm acute on chronic left subdural hematoma. No associated midline shift. 2. Stable 6 mm chronic right subdural hematoma. 3. No new intracranial abnormality. 4. No acute displaced fracture or traumatic listhesis of the cervical spine. 5. Persistent right apical subpleural 1.5 cm ground-glass nodular-like airspace opacity (increased in size from 2018). Adenocarcinoma is not excluded. Additional imaging evaluation or consultation with Pulmonology or Thoracic Surgery recommended.  PATIENT  SURVEYS:  Eval:  LEFS 20 / 80 = 25.0 % 03/05/2023:  Lower Extremity Functional Score: 35 / 80 = 43.8 % 04/29/2023:  Lower Extremity Functional Score: 59 / 80 = 73.8 %  COGNITION: Overall cognitive status:  wife reports occasionally pt has word finding problems and some memory impairments      SENSATION: Wife reports that sometimes left hand has spasms  MUSCLE LENGTH: Hamstrings: Tightness bilaterally  POSTURE: rounded shoulders, forward head, and flexed trunk    LOWER EXTREMITY ROM:  WFL  LOWER EXTREMITY MMT:  Eval: Right LE strength grossly 4-/5 Left LE strength grossly 4 to 4+/5 throughout  FUNCTIONAL TESTS:  Eval:   5 times sit to stand: 14.96 sec with UE use required Timed up and go (TUG): 13.71 sec without UE device with unsteadiness noted especially with 180 degree turn BERG:  27/56  01/24/2023: 3 minute walk:  378 ft with SPC (pt with shuffling gait pattern)  02/26/2023: Timed up and go (TUG): 11.69 sec 5 times sit to stand: 13.28 sec with UE use required 3 minute walk: 470 ft without assistive device  03/05/2023: BERG: 39/56  04/02/2023: 3 minute walk: 454 ft  04/09/2023: Timed up and go (TUG): 10.16 sec 5 times sit to stand: 15.81 sec first time, 13.94 sec second time 3 minute walk: 499 ft  04/22/2023: 6 minute walk:  955 ft  04/29/2023: 5 times sit to stand: 18.61 sec first test, 16.99 sec second time BERG: 41/56 6 minute walk: 980 ft  GAIT: Distance walked: >200 ft Assistive device utilized: Single point cane Level of assistance: SBA Comments: Pt with unsteady gait noted and requires SBA   TODAY'S TREATMENT:     DATE: 05/21/2023 Nustep level 5 x6 min with PT present to discuss status Seated LAQ, marching, and hip ER with 5# on ankles 2x10 each bilat Sit to/from stand on chair with foam pad to elevate surface 2x6 without UE support Standing with 5# on bilat ankles: hip abduction, hip extension, high marching.  2x10 each  bilat Ambulation with 5# on bilat ankles down to cancer gym and back x2 with seated recovery period in between Leg Press (seat at 9) 125# 2x10 Retrogait with 10# cable pulley x10 Alt LE toe tap to 6 step without UE support 2x10 Standing hamstring stretch with foot on stair 2x20 sec bilat   05/17/23:Pt arrives for aquatic physical therapy. Treatment took place in 3.5-5.5 feet of water. Water temperature was 91 degrees F. Pt entered the pool via stairs step to step with heavy use of the rails. Pt requires buoyancy of water for support and to offload joints with strengthening exercises.   Seated water bench with 75% submersion Pt performed seated LE AROM exercises with concurrent discussion of current status and pain assessment. 75% depth water walking with yellow noodle 10x each direction, some LOB but pt could self correct. Hip 3 ways 15x Bil with UE assit on side of pool  High knee forward marching 6 lengths of pool holding on the yellow noodle. Tandem walking (slightly  wider than heel-toe to increase success) 4 lengths holding hand floats. Static stance with small step forward and UE horizontal adb/add with green water bells 2x10 Bil then forward and back 2x10. RTLE step ups 2x15, short seated rest break d/t LE fatigue.                                 05/10/23:Pt arrives for aquatic physical therapy. Treatment took place in 3.5-5.5 feet of water. Water temperature was 91 degrees F. Pt entered the pool via stairs step to step with heavy use of the rails. Pt requires buoyancy of water for support and to offload joints with strengthening exercises.   Seated water bench with 75% submersion Pt performed seated LE AROM exercises with concurrent discussion of current status and pain assessment. 75% depth water walking with yellow noodle 10x each direction, some LOB but pt could self correct. Hip 3 ways 15x Bil with UE assit on side of pool  High knee forward marching 6 lengths of pool holding on the yellow  noodle. Tandem walking (slightly wider than heel-toe to increase success) 4 lengths holding hand floats. Static stance with small step forward and UE horizontal adb/add with green water bells 2x10 Bil then forward and back 2x10. RTLE step ups 2x15, short seated rest break d/t LE fatigue.                                                                                                         PATIENT EDUCATION:  Education details: aquatic exercise technique and rationale Person educated: Patient and Spouse Education method: Explanation, Facilities Manager,  Education comprehension: verbalized understanding  HOME EXERCISE PROGRAM: Access Code: [email or web address] URL: [email or web address] Date: 01/10/2023 Prepared by: Jarrell Makynzi [place]  Exercises - Sit to Stand  - 1 x daily - 7 x weekly - 2 sets - 10 reps - Standing Hip Abduction with Counter Support  - 1 x daily - 7 x weekly - 2 sets - 10 reps - Standing Hip Extension with Counter Support  - 1 x daily - 7 x weekly - 2 sets - 10 reps - Standing March with Counter Support  - 1 x daily - 7 x weekly - 2 sets - 10 reps - Heel Raises with Counter Support  - 1 x daily - 7 x weekly - 2 sets - 10 reps  ASSESSMENT:  CLINICAL IMPRESSION:  Mr Jafari presents to skilled PT reporting that he has not had any new falls.  States that he is using his new knee brace sleeve to provide him more support.  Patient continues to progress with strengthening and standing tolerance.  Patient with increased tightness on bilateral hamstrings and requires assistance with standing stretch.  Patient continues to require skilled PT to progress towards goal related activities.   OBJECTIVE IMPAIRMENTS: decreased balance, difficulty walking, decreased strength, impaired flexibility, postural dysfunction, and pain.   ACTIVITY LIMITATIONS: carrying, lifting, bending, standing, squatting, and stairs  PARTICIPATION LIMITATIONS: cleaning and community activity  PERSONAL FACTORS:  Past/current  experiences, Time since onset of injury/illness/exacerbation, and 3+ comorbidities: subdural hematoma, HTN, OA  are also affecting patient's functional outcome.   REHAB POTENTIAL: Good  CLINICAL DECISION MAKING: Evolving/moderate complexity  EVALUATION COMPLEXITY: Moderate   GOALS: Goals reviewed with patient? Yes  SHORT TERM GOALS: Target date: 02/01/2023 Pt will be independent with initial HEP. Baseline: Goal status: MET  2.  Pt will improve ambulation safety with least restrictive assistive device to allow him to safely ambulate distances required for granddaughter's wedding on 02/02/2023. Baseline:  Goal status: Met   LONG TERM GOALS: Target date: 06/28/2023   Pt will be independent with advanced HEP to allow for self progression post discharge. Baseline:  Goal status: Ongoing  2.  Patient will increase Lower Extremity Functional Scale to at least 50% to demonstrate improvement in functional tasks. Baseline: 25% Goal status: MET on 04/29/2023  3.  Patient will increase BERG to at least 42/56 to demonstrate decreased risk of falling. Baseline: 27/56 Goal status: Ongoing (see above)  4.  Pt will report being able to ambulate at least 20 minutes with LRAD without increased pain and no loss of balance to allow for community ambulation. Baseline:  Goal status: Ongoing  5.  Pt will increase LE strength to at least 4+/5 to allow him to perform sit to stand without UE use and navigate a curb with LRAD safely. Baseline:  Goal status: Ongoing   PLAN:  PT FREQUENCY: 2x/week  PT DURATION: 8 weeks  PLANNED INTERVENTIONS: Therapeutic exercises, Therapeutic activity, Neuromuscular re-education, Balance training, Gait training, Patient/Family education, Self Care, Joint mobilization, Joint manipulation, Stair training, Vestibular training, Canalith repositioning, Aquatic Therapy, Dry Needling, Electrical stimulation, Spinal manipulation, Spinal mobilization,  Cryotherapy, Moist heat, Taping, Traction, Ultrasound, Ionotophoresis 4mg /ml Dexamethasone , Manual therapy, and Re-evaluation  PLAN FOR NEXT SESSION: Strengthening, balance, flexibility, focus on proximal hip and core strength, aquatics    Jarrell Laming, PT, DPT 05/21/23, 11:46 AM  [facility] [address], [address] [place], [place] [address] Phone # [phone] Fax [phone]

## 2023-05-21 NOTE — Therapy (Signed)
 OUTPATIENT PHYSICAL THERAPY TREATMENT NOTE  Patient Name: Brandon [name] MRN: [id number] DOB:08-21-1945, 78 y.o., male Today's Date: 05/22/2023    END OF SESSION:  PT End of Session - 05/22/23 0932     Visit Number 27    Date for PT Re-Evaluation 06/28/23    Authorization Type [employer]    Authorization Time Period 04/22/2023 - 06/28/2023    Authorization - Visit Number 8    Authorization - Number of Visits 20    Progress Note Due on Visit 32    PT Start Time 0931    PT Stop Time 1011    PT Time Calculation (min) 40 min    Activity Tolerance Patient tolerated treatment well    Behavior During Therapy WFL for tasks assessed/performed                    Past Medical History:  Diagnosis Date   Allergy    Basal cell carcinoma    GERD (gastroesophageal reflux disease)    History of kidney cancer    benign; told not cancerous   Hypertension    Past Surgical History:  Procedure Laterality Date   CATARACT EXTRACTION Bilateral    CRANIOTOMY Bilateral 12/25/2021   Procedure: CRANIOTOMY HEMATOMA EVACUATION SUBDURAL;  Surgeon: Louis Shove, MD;  Location: [facility] OR;  Service: Neurosurgery;  Laterality: Bilateral;   HERNIA REPAIR     x2   KIDNEY SURGERY Right    Had right kidney removed.    TONSILLECTOMY     VASECTOMY     Patient Active Problem List   Diagnosis Date Noted   Cognitive and neurobehavioral dysfunction following brain injury (HCC) 02/07/2022   Gait difficulty 02/07/2022   Traumatic brain injury with loss of consciousness (HCC) 12/30/2021   SDH (subdural hematoma) (HCC) 12/24/2021   Hyperlipidemia 02/13/2018   BPH (benign prostatic hyperplasia) 01/31/2017   Heart murmur 01/20/2016   HTN (hypertension) 03/09/2012    PCP: Merna Huxley, NP  REFERRING PROVIDER: Merna Huxley, NP  REFERRING DIAG: R26.81 (ICD-10-CM) - Gait instability  THERAPY DIAG:  Muscle weakness (generalized)  Balance problem  Unsteadiness on feet  Difficulty in  walking, not elsewhere classified  Abnormal posture  Rationale for Evaluation and Treatment: Rehabilitation  ONSET DATE: Pt and wife report that frequent falls have been occurring for the past year  SUBJECTIVE:   SUBJECTIVE STATEMENT: Good weekend, my knee is feeling good today. My knee brace helps keep it from buckling.  PERTINENT HISTORY: Subdural Hematoma, HTN, GERD, Basal Cell Carcinoma, Hx of benign kidney mass, OA  PAIN:  Are you having pain? Not today  PRECAUTIONS: Fall  RED FLAGS: None   WEIGHT BEARING RESTRICTIONS: No  FALLS:  Has patient fallen in last 6 months? Yes. Number of falls 3  LIVING ENVIRONMENT: Lives with: lives with their spouse Lives in: House/apartment Stairs:  one level home, one step to enter porch Has following equipment at home: Single point cane, Environmental Consultant - 2 wheeled, Environmental Consultant - 4 wheeled, shower chair, and Grab bars  OCCUPATION: retired  PLOF: Requires assistive device for independence and Leisure: walking with wife, going out to eat, movies  PATIENT GOALS: To be able to walk into wedding venue for granddaughter's wedding on September 21st, 2024.  NEXT MD VISIT: Huxley Merna on 07/01/2022  OBJECTIVE:   DIAGNOSTIC FINDINGS: Head CT on 04/27/2023: IMPRESSION: 1. Stable 6 mm acute on chronic left subdural hematoma. No associated midline shift. 2. Stable 6 mm chronic right subdural hematoma. 3.  No new intracranial abnormality. 4. No acute displaced fracture or traumatic listhesis of the cervical spine. 5. Persistent right apical subpleural 1.5 cm ground-glass nodular-like airspace opacity (increased in size from 2018). Adenocarcinoma is not excluded. Additional imaging evaluation or consultation with Pulmonology or Thoracic Surgery recommended.  PATIENT SURVEYS:  Eval:  LEFS 20 / 80 = 25.0 % 03/05/2023:  Lower Extremity Functional Score: 35 / 80 = 43.8 % 04/29/2023:  Lower Extremity Functional Score: 59 / 80 = 73.8  %  COGNITION: Overall cognitive status:  wife reports occasionally pt has word finding problems and some memory impairments      SENSATION: Wife reports that sometimes left hand has spasms  MUSCLE LENGTH: Hamstrings: Tightness bilaterally  POSTURE: rounded shoulders, forward head, and flexed trunk    LOWER EXTREMITY ROM:  WFL  LOWER EXTREMITY MMT:  Eval: Right LE strength grossly 4-/5 Left LE strength grossly 4 to 4+/5 throughout  FUNCTIONAL TESTS:  Eval:   5 times sit to stand: 14.96 sec with UE use required Timed up and go (TUG): 13.71 sec without UE device with unsteadiness noted especially with 180 degree turn BERG:  27/56  01/24/2023: 3 minute walk:  378 ft with SPC (pt with shuffling gait pattern)  02/26/2023: Timed up and go (TUG): 11.69 sec 5 times sit to stand: 13.28 sec with UE use required 3 minute walk: 470 ft without assistive device  03/05/2023: BERG: 39/56  04/02/2023: 3 minute walk: 454 ft  04/09/2023: Timed up and go (TUG): 10.16 sec 5 times sit to stand: 15.81 sec first time, 13.94 sec second time 3 minute walk: 499 ft  04/22/2023: 6 minute walk:  955 ft  04/29/2023: 5 times sit to stand: 18.61 sec first test, 16.99 sec second time BERG: 41/56 6 minute walk: 980 ft  GAIT: Distance walked: >200 ft Assistive device utilized: Single point cane Level of assistance: SBA Comments: Pt with unsteady gait noted and requires SBA   TODAY'S TREATMENT:    05/22/23: Pt arrives for aquatic physical therapy. Treatment took place in 3.5-5.5 feet of water. Water temperature was 92 degrees F. Pt entered the pool via stairs step to step with heavy use of the rails. Pt requires buoyancy of water for support and to offload joints with strengthening exercises.   Seated water bench with 75% submersion Pt performed seated LE AROM exercises with concurrent discussion of current status and pain assessment. 75% depth water walking with UE floats for push/pull/  10x each direction, some LOB but pt could self correct. Hip 3 ways 20x Bil with UE assit on side of pool  No UE support for heel raises 10x. High knee forward marching 6 lengths of pool holding on the yellow noodle. Tandem walking (slightly wider than heel-toe to increase success) 4 lengths holding hand floats. Static stance with small step forward and UE horizontal adb/add with green water bells 2x10 Bil then forward and back 2x10. RTLE step ups 2x15, short seated rest break d/t LE fatigue. Steps up RT 2x10 both holding rails and not holding rails. Alt toe taps to second step 2x10 no UE support.     DATE: 05/21/2023 Nustep level 5 x6 min with PT present to discuss status Seated LAQ, marching, and hip ER with 5# on ankles 2x10 each bilat Sit to/from stand on chair with foam pad to elevate surface 2x6 without UE support Standing with 5# on bilat ankles: hip abduction, hip extension, high marching.  2x10 each bilat Ambulation with 5# on bilat  ankles down to cancer gym and back x2 with seated recovery period in between Leg Press (seat at 9) 125# 2x10 Retrogait with 10# cable pulley x10 Alt LE toe tap to 6 step without UE support 2x10 Standing hamstring stretch with foot on stair 2x20 sec bilat   05/17/23:Pt arrives for aquatic physical therapy. Treatment took place in 3.5-5.5 feet of water. Water temperature was 91 degrees F. Pt entered the pool via stairs step to step with heavy use of the rails. Pt requires buoyancy of water for support and to offload joints with strengthening exercises.   Seated water bench with 75% submersion Pt performed seated LE AROM exercises with concurrent discussion of current status and pain assessment. 75% depth water walking with yellow noodle 10x each direction, some LOB but pt could self correct. Hip 3 ways 15x Bil with UE assit on side of pool  High knee forward marching 6 lengths of pool holding on the yellow noodle. Tandem walking (slightly wider than heel-toe to  increase success) 4 lengths holding hand floats. Static stance with small step forward and UE horizontal adb/add with green water bells 2x10 Bil then forward and back 2x10. RTLE step ups 2x15, short seated rest break d/t LE fatigue.                                 PATIENT EDUCATION:  Education details: aquatic exercise technique and rationale Person educated: Patient and Spouse Education method: Explanation, Facilities Manager,  Education comprehension: verbalized understanding  HOME EXERCISE PROGRAM: Access Code: [email or web address] URL: [email or web address] Date: 01/10/2023 Prepared by: Jarrell Menke  Exercises - Sit to Stand  - 1 x daily - 7 x weekly - 2 sets - 10 reps - Standing Hip Abduction with Counter Support  - 1 x daily - 7 x weekly - 2 sets - 10 reps - Standing Hip Extension with Counter Support  - 1 x daily - 7 x weekly - 2 sets - 10 reps - Standing March with Counter Support  - 1 x daily - 7 x weekly - 2 sets - 10 reps - Heel Raises with Counter Support  - 1 x daily - 7 x weekly - 2 sets - 10 reps  ASSESSMENT:  CLINICAL IMPRESSION: No knee pain or buckling with step ups. Pt will occasionally lose balance requiring his UE grabbing onto something to regain. Probably not as steady as last session. Fatigue in LE definitely a factor.  OBJECTIVE IMPAIRMENTS: decreased balance, difficulty walking, decreased strength, impaired flexibility, postural dysfunction, and pain.   ACTIVITY LIMITATIONS: carrying, lifting, bending, standing, squatting, and stairs  PARTICIPATION LIMITATIONS: cleaning and community activity  PERSONAL FACTORS: Past/current experiences, Time since onset of injury/illness/exacerbation, and 3+ comorbidities: subdural hematoma, HTN, OA  are also affecting patient's functional outcome.   REHAB POTENTIAL: Good  CLINICAL DECISION MAKING: Evolving/moderate complexity  EVALUATION COMPLEXITY: Moderate   GOALS: Goals reviewed with patient? Yes  SHORT TERM  GOALS: Target date: 02/01/2023 Pt will be independent with initial HEP. Baseline: Goal status: MET  2.  Pt will improve ambulation safety with least restrictive assistive device to allow him to safely ambulate distances required for granddaughter's wedding on 02/02/2023. Baseline:  Goal status: Met   LONG TERM GOALS: Target date: 06/28/2023   Pt will be independent with advanced HEP to allow for self progression post discharge. Baseline:  Goal status: Ongoing  2.  Patient will increase Lower Extremity Functional Scale to at  least 50% to demonstrate improvement in functional tasks. Baseline: 25% Goal status: MET on 04/29/2023  3.  Patient will increase BERG to at least 42/56 to demonstrate decreased risk of falling. Baseline: 27/56 Goal status: Ongoing (see above)  4.  Pt will report being able to ambulate at least 20 minutes with LRAD without increased pain and no loss of balance to allow for community ambulation. Baseline:  Goal status: Ongoing  5.  Pt will increase LE strength to at least 4+/5 to allow him to perform sit to stand without UE use and navigate a curb with LRAD safely. Baseline:  Goal status: Ongoing   PLAN:  PT FREQUENCY: 2x/week  PT DURATION: 8 weeks  PLANNED INTERVENTIONS: Therapeutic exercises, Therapeutic activity, Neuromuscular re-education, Balance training, Gait training, Patient/Family education, Self Care, Joint mobilization, Joint manipulation, Stair training, Vestibular training, Canalith repositioning, Aquatic Therapy, Dry Needling, Electrical stimulation, Spinal manipulation, Spinal mobilization, Cryotherapy, Moist heat, Taping, Traction, Ultrasound, Ionotophoresis 4mg /ml Dexamethasone , Manual therapy, and Re-evaluation  PLAN FOR NEXT SESSION: Strengthening, balance, flexibility, focus on proximal hip and core strength, aquatics    Delon Darner, PTA 05/22/23 10:12 AM   [facility] [address] [place], [place] [address] Phone # [phone] Fax [phone]

## 2023-05-22 ENCOUNTER — Encounter: Payer: Self-pay | Admitting: Physical Therapy

## 2023-05-22 ENCOUNTER — Ambulatory Visit: Payer: Medicare HMO | Admitting: Physical Therapy

## 2023-05-22 DIAGNOSIS — R2689 Other abnormalities of gait and mobility: Secondary | ICD-10-CM

## 2023-05-22 DIAGNOSIS — R293 Abnormal posture: Secondary | ICD-10-CM | POA: Diagnosis not present

## 2023-05-22 DIAGNOSIS — M6281 Muscle weakness (generalized): Secondary | ICD-10-CM

## 2023-05-22 DIAGNOSIS — R2681 Unsteadiness on feet: Secondary | ICD-10-CM

## 2023-05-22 DIAGNOSIS — R262 Difficulty in walking, not elsewhere classified: Secondary | ICD-10-CM | POA: Diagnosis not present

## 2023-05-24 ENCOUNTER — Ambulatory Visit: Payer: Medicare HMO | Admitting: Physical Therapy

## 2023-05-29 ENCOUNTER — Ambulatory Visit: Payer: Medicare HMO | Admitting: Rehabilitative and Restorative Service Providers"

## 2023-05-29 ENCOUNTER — Encounter: Payer: Self-pay | Admitting: Rehabilitative and Restorative Service Providers"

## 2023-05-29 DIAGNOSIS — R2689 Other abnormalities of gait and mobility: Secondary | ICD-10-CM

## 2023-05-29 DIAGNOSIS — R2681 Unsteadiness on feet: Secondary | ICD-10-CM

## 2023-05-29 DIAGNOSIS — R293 Abnormal posture: Secondary | ICD-10-CM | POA: Diagnosis not present

## 2023-05-29 DIAGNOSIS — R262 Difficulty in walking, not elsewhere classified: Secondary | ICD-10-CM

## 2023-05-29 DIAGNOSIS — M6281 Muscle weakness (generalized): Secondary | ICD-10-CM | POA: Diagnosis not present

## 2023-05-29 NOTE — Therapy (Signed)
 OUTPATIENT PHYSICAL THERAPY TREATMENT NOTE  Patient Name: Brandon [name] MRN: [id number] DOB:[birth year], 78 y.o., male Today's Date: 05/29/2023    END OF SESSION:  PT End of Session - 05/29/23 1106     Visit Number 28    Date for PT Re-Evaluation 06/28/23    Authorization Type [employer]    Authorization Time Period 04/22/2023 - 06/28/2023    Authorization - Visit Number 9    Authorization - Number of Visits 20    Progress Note Due on Visit 32    PT Start Time 1102    PT Stop Time 1140    PT Time Calculation (min) 38 min    Activity Tolerance Patient tolerated treatment well    Behavior During Therapy WFL for tasks assessed/performed                    Past Medical History:  Diagnosis Date   Allergy    Basal cell carcinoma    GERD (gastroesophageal reflux disease)    History of kidney cancer    benign; told not cancerous   Hypertension    Past Surgical History:  Procedure Laterality Date   CATARACT EXTRACTION Bilateral    CRANIOTOMY Bilateral 12/25/2021   Procedure: CRANIOTOMY HEMATOMA EVACUATION SUBDURAL;  Surgeon: Agustina Aldrich, MD;  Location: [facility] OR;  Service: Neurosurgery;  Laterality: Bilateral;   HERNIA REPAIR     x2   KIDNEY SURGERY Right    Had right kidney removed.    TONSILLECTOMY     VASECTOMY     Patient Active Problem List   Diagnosis Date Noted   Cognitive and neurobehavioral dysfunction following brain injury (HCC) 02/07/2022   Gait difficulty 02/07/2022   Traumatic brain injury with loss of consciousness (HCC) 12/30/2021   SDH (subdural hematoma) (HCC) 12/24/2021   Hyperlipidemia 02/13/2018   BPH (benign prostatic hyperplasia) 01/31/2017   Heart murmur 01/20/2016   HTN (hypertension) 03/09/2012    PCP: Alto Atta, NP  REFERRING PROVIDER: Alto Atta, NP  REFERRING DIAG: R26.81 (ICD-10-CM) - Gait instability  THERAPY DIAG:  Muscle weakness (generalized)  Balance problem  Unsteadiness on feet  Difficulty in  walking, not elsewhere classified  Rationale for Evaluation and Treatment: Rehabilitation  ONSET DATE: Pt and wife report that frequent falls have been occurring for the past year  SUBJECTIVE:   SUBJECTIVE STATEMENT:  Pt reports that aquatic session went well.  Denies any new falls.  PERTINENT HISTORY: Subdural Hematoma, HTN, GERD, Basal Cell Carcinoma, Hx of benign kidney mass, OA  PAIN:  Are you having pain? Yes: NPRS scale: 2/10 Pain location: bilateral knees (right greater than left) Pain description: sore Aggravating factors: unknown Relieving factors: Voltaren  gel  PRECAUTIONS: Fall  RED FLAGS: None   WEIGHT BEARING RESTRICTIONS: No  FALLS:  Has patient fallen in last 6 months? Yes. Number of falls 3  LIVING ENVIRONMENT: Lives with: lives with their spouse Lives in: House/apartment Stairs:  one level home, one step to enter porch Has following equipment at home: Single point cane, Environmental consultant - 2 wheeled, Environmental consultant - 4 wheeled, shower chair, and Grab bars  OCCUPATION: retired  PLOF: Requires assistive device for independence and Leisure: walking with wife, going out to eat, movies  PATIENT GOALS: To be able to walk into wedding venue for granddaughter's wedding on September 21st, 2024.  NEXT MD VISIT: Alto Atta on 07/01/2022  OBJECTIVE:   DIAGNOSTIC FINDINGS: Head CT on 04/27/2023: IMPRESSION: 1. Stable 6 mm acute on chronic left subdural  hematoma. No associated midline shift. 2. Stable 6 mm chronic right subdural hematoma. 3. No new intracranial abnormality. 4. No acute displaced fracture or traumatic listhesis of the cervical spine. 5. Persistent right apical subpleural 1.5 cm ground-glass nodular-like airspace opacity (increased in size from 2018). Adenocarcinoma is not excluded. Additional imaging evaluation or consultation with Pulmonology or Thoracic Surgery recommended.  PATIENT SURVEYS:  Eval:  LEFS 20 / 80 = 25.0 % 03/05/2023:  Lower Extremity  Functional Score: 35 / 80 = 43.8 % 04/29/2023:  Lower Extremity Functional Score: 59 / 80 = 73.8 %  COGNITION: Overall cognitive status:  wife reports occasionally pt has word finding problems and some memory impairments      SENSATION: Wife reports that sometimes left hand has "spasms"  MUSCLE LENGTH: Hamstrings: Tightness bilaterally  POSTURE: rounded shoulders, forward head, and flexed trunk    LOWER EXTREMITY ROM:  WFL  LOWER EXTREMITY MMT:  Eval: Right LE strength grossly 4-/5 Left LE strength grossly 4 to 4+/5 throughout  FUNCTIONAL TESTS:  Eval:   5 times sit to stand: 14.96 sec with UE use required Timed up and go (TUG): 13.71 sec without UE device with unsteadiness noted especially with 180 degree turn BERG:  27/56  01/24/2023: 3 minute walk:  378 ft with SPC (pt with shuffling gait pattern)  02/26/2023: Timed up and go (TUG): 11.69 sec 5 times sit to stand: 13.28 sec with UE use required 3 minute walk: 470 ft without assistive device  03/05/2023: BERG: 39/56  04/02/2023: 3 minute walk: 454 ft  04/09/2023: Timed up and go (TUG): 10.16 sec 5 times sit to stand: 15.81 sec first time, 13.94 sec second time 3 minute walk: 499 ft  04/22/2023: 6 minute walk:  955 ft  04/29/2023: 5 times sit to stand: 18.61 sec first test, 16.99 sec second time BERG: 41/56 6 minute walk: 980 ft  GAIT: Distance walked: >200 ft Assistive device utilized: Single point cane Level of assistance: SBA Comments: Pt with unsteady gait noted and requires SBA   TODAY'S TREATMENT:    DATE: 05/29/2023 Nustep level 5 x6 min with PT present to discuss status Seated LAQ, marching, and hip ER with 6# on ankles 2x10 each bilat Sit to/from stand on chair with foam pad to elevate surface 2x6 without UE support Standing with 5# on bilat ankles: hip abduction, hip extension, high marching.  2x10 each bilat Ambulation with 5# on bilat ankles down to cancer gym and back x2 with seated  recovery period in between Seated hamstring stretch 2x20 sec bilat Leg Press (seat at 9) 125# 2x10 Retrogait with 15# cable pulley x10 Alt LE toe tap to 6" step without UE support 2x10   DATE: 05/22/23:  Pt arrives for aquatic physical therapy. Treatment took place in 3.5-5.5 feet of water. Water temperature was 92 degrees F. Pt entered the pool via stairs step to step with heavy use of the rails. Pt requires buoyancy of water for support and to offload joints with strengthening exercises.   Seated water bench with 75% submersion Pt performed seated LE AROM exercises with concurrent discussion of current status and pain assessment. 75% depth water walking with UE floats for push/pull/ 10x each direction, some LOB but pt could self correct. Hip 3 ways 20x Bil with UE assit on side of pool  No UE support for heel raises 10x. High knee forward marching 6 lengths of pool holding on the yellow noodle. Tandem walking (slightly wider than heel-toe to increase success)  4 lengths holding hand floats. Static stance with small step forward and UE horizontal adb/add with green water bells 2x10 Bil then forward and back 2x10. RTLE step ups 2x15, short seated rest break d/t LE fatigue. Steps up RT 2x10 both holding rails and not holding rails. Alt toe taps to second step 2x10 no UE support.     DATE: 05/21/2023 Nustep level 5 x6 min with PT present to discuss status Seated LAQ, marching, and hip ER with 5# on ankles 2x10 each bilat Sit to/from stand on chair with foam pad to elevate surface 2x6 without UE support Standing with 5# on bilat ankles: hip abduction, hip extension, high marching.  2x10 each bilat Ambulation with 5# on bilat ankles down to cancer gym and back x2 with seated recovery period in between Leg Press (seat at 9) 125# 2x10 Retrogait with 10# cable pulley x10 Alt LE toe tap to 6" step without UE support 2x10 Standing hamstring stretch with foot on stair 2x20 sec bilat                             PATIENT EDUCATION:  Education details: aquatic exercise technique and rationale Person educated: Patient and Spouse Education method: Programmer, multimedia, Facilities manager,  Education comprehension: verbalized understanding  HOME EXERCISE PROGRAM: Access Code: [email or web address] URL: [email or web address] Date: 01/10/2023 Prepared by: Chaneta Comer Trinh Sanjose  Exercises - Sit to Stand  - 1 x daily - 7 x weekly - 2 sets - 10 reps - Standing Hip Abduction with Counter Support  - 1 x daily - 7 x weekly - 2 sets - 10 reps - Standing Hip Extension with Counter Support  - 1 x daily - 7 x weekly - 2 sets - 10 reps - Standing March with Counter Support  - 1 x daily - 7 x weekly - 2 sets - 10 reps - Heel Raises with Counter Support  - 1 x daily - 7 x weekly - 2 sets - 10 reps  ASSESSMENT:  CLINICAL IMPRESSION:  Mr Blauer presents to skilled PT reporting feeling similar to last week.  Patient continues to have minimal knee pain.  Patient able to progress up a pound for exercises today.  Patient continues with tight hamstrings.  Patient with good participation throughout session and no loss of balance noted during dry land appointment.   OBJECTIVE IMPAIRMENTS: decreased balance, difficulty walking, decreased strength, impaired flexibility, postural dysfunction, and pain.   ACTIVITY LIMITATIONS: carrying, lifting, bending, standing, squatting, and stairs  PARTICIPATION LIMITATIONS: cleaning and community activity  PERSONAL FACTORS: Past/current experiences, Time since onset of injury/illness/exacerbation, and 3+ comorbidities: subdural hematoma, HTN, OA  are also affecting patient's functional outcome.   REHAB POTENTIAL: Good  CLINICAL DECISION MAKING: Evolving/moderate complexity  EVALUATION COMPLEXITY: Moderate   GOALS: Goals reviewed with patient? Yes  SHORT TERM GOALS: Target date: 02/01/2023 Pt will be independent with initial HEP. Baseline: Goal status: MET  2.  Pt will improve ambulation  safety with least restrictive assistive device to allow him to safely ambulate distances required for granddaughter's wedding on 02/02/2023. Baseline:  Goal status: Met   LONG TERM GOALS: Target date: 06/28/2023   Pt will be independent with advanced HEP to allow for self progression post discharge. Baseline:  Goal status: Ongoing  2.  Patient will increase Lower Extremity Functional Scale to at least 50% to demonstrate improvement in functional tasks. Baseline: 25% Goal status: MET on 04/29/2023  3.  Patient will increase BERG  to at least 42/56 to demonstrate decreased risk of falling. Baseline: 27/56 Goal status: Ongoing (see above)  4.  Pt will report being able to ambulate at least 20 minutes with LRAD without increased pain and no loss of balance to allow for community ambulation. Baseline:  Goal status: Ongoing  5.  Pt will increase LE strength to at least 4+/5 to allow him to perform sit to stand without UE use and navigate a curb with LRAD safely. Baseline:  Goal status: Ongoing   PLAN:  PT FREQUENCY: 2x/week  PT DURATION: 8 weeks  PLANNED INTERVENTIONS: Therapeutic exercises, Therapeutic activity, Neuromuscular re-education, Balance training, Gait training, Patient/Family education, Self Care, Joint mobilization, Joint manipulation, Stair training, Vestibular training, Canalith repositioning, Aquatic Therapy, Dry Needling, Electrical stimulation, Spinal manipulation, Spinal mobilization, Cryotherapy, Moist heat, Taping, Traction, Ultrasound, Ionotophoresis 4mg /ml Dexamethasone , Manual therapy, and Re-evaluation  PLAN FOR NEXT SESSION: Strengthening, balance, flexibility, focus on proximal hip and core strength, aquatics    Robyne Christen, PT, DPT 05/29/23, 11:57 AM  [facility] [address], [address] [place], [place] [address] Phone # [phone] Fax [phone]

## 2023-05-30 NOTE — Therapy (Signed)
OUTPATIENT PHYSICAL THERAPY TREATMENT NOTE  Patient Name: Brandon [name] MRN: [id number] DOB:[birth year], 78 y.o., male Today's Date: 05/31/2023    END OF SESSION:  PT End of Session - 05/31/23 1510     Visit Number 29    Date for PT Re-Evaluation 06/28/23    Authorization Type [employer]    Authorization Time Period 04/22/2023 - 06/28/2023    Authorization - Number of Visits 20    Progress Note Due on Visit 32    PT Start Time 1430    PT Stop Time 1510    PT Time Calculation (min) 40 min    Activity Tolerance Patient tolerated treatment well    Behavior During Therapy WFL for tasks assessed/performed                     Past Medical History:  Diagnosis Date   Allergy    Basal cell carcinoma    GERD (gastroesophageal reflux disease)    History of kidney cancer    benign; told not cancerous   Hypertension    Past Surgical History:  Procedure Laterality Date   CATARACT EXTRACTION Bilateral    CRANIOTOMY Bilateral 12/25/2021   Procedure: CRANIOTOMY HEMATOMA EVACUATION SUBDURAL;  Surgeon: Julio Sicks, MD;  Location: [facility] OR;  Service: Neurosurgery;  Laterality: Bilateral;   HERNIA REPAIR     x2   KIDNEY SURGERY Right    Had right kidney removed.    TONSILLECTOMY     VASECTOMY     Patient Active Problem List   Diagnosis Date Noted   Cognitive and neurobehavioral dysfunction following brain injury (HCC) 02/07/2022   Gait difficulty 02/07/2022   Traumatic brain injury with loss of consciousness (HCC) 12/30/2021   SDH (subdural hematoma) (HCC) 12/24/2021   Hyperlipidemia 02/13/2018   BPH (benign prostatic hyperplasia) 01/31/2017   Heart murmur 01/20/2016   HTN (hypertension) 03/09/2012    PCP: Shirline Frees, NP  REFERRING PROVIDER: Shirline Frees, NP  REFERRING DIAG: R26.81 (ICD-10-CM) - Gait instability  THERAPY DIAG:  Muscle weakness (generalized)  Balance problem  Unsteadiness on feet  Difficulty in walking, not elsewhere  classified  Abnormal posture  Rationale for Evaluation and Treatment: Rehabilitation  ONSET DATE: Pt and wife report that frequent falls have been occurring for the past year  SUBJECTIVE:   SUBJECTIVE STATEMENT:  No real complaints today.  PERTINENT HISTORY: Subdural Hematoma, HTN, GERD, Basal Cell Carcinoma, Hx of benign kidney mass, OA  PAIN:  Are you having pain? no: NPRS scale: 0/10 Pain location:  Pain description: sore Aggravating factors: unknown Relieving factors: Voltaren gel  PRECAUTIONS: Fall  RED FLAGS: None   WEIGHT BEARING RESTRICTIONS: No  FALLS:  Has patient fallen in last 6 months? Yes. Number of falls 3  LIVING ENVIRONMENT: Lives with: lives with their spouse Lives in: House/apartment Stairs:  one level home, one step to enter porch Has following equipment at home: Single point cane, Environmental consultant - 2 wheeled, Environmental consultant - 4 wheeled, shower chair, and Grab bars  OCCUPATION: retired  PLOF: Requires assistive device for independence and Leisure: walking with wife, going out to eat, movies  PATIENT GOALS: To be able to walk into wedding venue for granddaughter's wedding on September 21st, 2024.  NEXT MD VISIT: Shirline Frees on 07/01/2022  OBJECTIVE:   DIAGNOSTIC FINDINGS: Head CT on 04/27/2023: IMPRESSION: 1. Stable 6 mm acute on chronic left subdural hematoma. No associated midline shift. 2. Stable 6 mm chronic right subdural hematoma. 3. No new intracranial  abnormality. 4. No acute displaced fracture or traumatic listhesis of the cervical spine. 5. Persistent right apical subpleural 1.5 cm ground-glass nodular-like airspace opacity (increased in size from 2018). Adenocarcinoma is not excluded. Additional imaging evaluation or consultation with Pulmonology or Thoracic Surgery recommended.  PATIENT SURVEYS:  Eval:  LEFS 20 / 80 = 25.0 % 03/05/2023:  Lower Extremity Functional Score: 35 / 80 = 43.8 % 04/29/2023:  Lower Extremity Functional Score: 59  / 80 = 73.8 %  COGNITION: Overall cognitive status:  wife reports occasionally pt has word finding problems and some memory impairments      SENSATION: Wife reports that sometimes left hand has "spasms"  MUSCLE LENGTH: Hamstrings: Tightness bilaterally  POSTURE: rounded shoulders, forward head, and flexed trunk    LOWER EXTREMITY ROM:  WFL  LOWER EXTREMITY MMT:  Eval: Right LE strength grossly 4-/5 Left LE strength grossly 4 to 4+/5 throughout  FUNCTIONAL TESTS:  Eval:   5 times sit to stand: 14.96 sec with UE use required Timed up and go (TUG): 13.71 sec without UE device with unsteadiness noted especially with 180 degree turn BERG:  27/56  01/24/2023: 3 minute walk:  378 ft with SPC (pt with shuffling gait pattern)  02/26/2023: Timed up and go (TUG): 11.69 sec 5 times sit to stand: 13.28 sec with UE use required 3 minute walk: 470 ft without assistive device  03/05/2023: BERG: 39/56  04/02/2023: 3 minute walk: 454 ft  04/09/2023: Timed up and go (TUG): 10.16 sec 5 times sit to stand: 15.81 sec first time, 13.94 sec second time 3 minute walk: 499 ft  04/22/2023: 6 minute walk:  955 ft  04/29/2023: 5 times sit to stand: 18.61 sec first test, 16.99 sec second time BERG: 41/56 6 minute walk: 980 ft  GAIT: Distance walked: >200 ft Assistive device utilized: Single point cane Level of assistance: SBA Comments: Pt with unsteady gait noted and requires SBA   TODAY'S TREATMENT:    05/31/23:Pt arrives for aquatic physical therapy. Treatment took place in 3.5-5.5 feet of water. Water temperature was 92 degrees F. Pt entered the pool via stairs step to step with heavy use of the rails. Pt requires buoyancy of water for support and to offload joints with strengthening exercises.   Seated water bench with 75% submersion Pt performed seated LE AROM exercises with concurrent discussion of current status and pain assessment. 75% depth water walking with UE floats  for push/pull/ 10x each direction, some LOB but pt could self correct. Hip 3 ways 20x Bil with UE assit on side of pool  No UE support for heel raises 10x2. High knee forward marching 6 lengths of pool holding on the yellow noodle. Tandem walking (slightly wider than heel-toe to increase success) 4 lengths holding hand floats. Static stance with small step forward and UE horizontal adb/add with green water bells 2x10 Bil then forward and back 2x10. RTLE step ups 2x15, short seated rest break d/t LE fatigue. Steps up RT 2x10 both holding rails and not holding rails. Alt toe taps to second step 2x10 no UE support.      DATE: 05/29/2023 Nustep level 5 x6 min with PT present to discuss status Seated LAQ, marching, and hip ER with 6# on ankles 2x10 each bilat Sit to/from stand on chair with foam pad to elevate surface 2x6 without UE support Standing with 5# on bilat ankles: hip abduction, hip extension, high marching.  2x10 each bilat Ambulation with 5# on bilat ankles down to  cancer gym and back x2 with seated recovery period in between Seated hamstring stretch 2x20 sec bilat Leg Press (seat at 9) 125# 2x10 Retrogait with 15# cable pulley x10 Alt LE toe tap to 6" step without UE support 2x10   DATE: 05/22/23:  Pt arrives for aquatic physical therapy. Treatment took place in 3.5-5.5 feet of water. Water temperature was 92 degrees F. Pt entered the pool via stairs step to step with heavy use of the rails. Pt requires buoyancy of water for support and to offload joints with strengthening exercises.   Seated water bench with 75% submersion Pt performed seated LE AROM exercises with concurrent discussion of current status and pain assessment. 75% depth water walking with UE floats for push/pull/ 10x each direction, some LOB but pt could self correct. Hip 3 ways 20x Bil with UE assit on side of pool  No UE support for heel raises 10x. High knee forward marching 6 lengths of pool holding on the yellow noodle.  Tandem walking (slightly wider than heel-toe to increase success) 4 lengths holding hand floats. Static stance with small step forward and UE horizontal adb/add with green water bells 2x10 Bil then forward and back 2x10. RTLE step ups 2x15, short seated rest break d/t LE fatigue. Steps up RT 2x10 both holding rails and not holding rails. Alt toe taps to second step 2x10 no UE support.      PATIENT EDUCATION:  Education details: aquatic exercise technique and rationale Person educated: Patient and Spouse Education method: Explanation, Facilities manager,  Education comprehension: verbalized understanding  HOME EXERCISE PROGRAM: Access Code: [email or web address] URL: [email or web address] Date: 01/10/2023 Prepared by: Clydie Braun Menke  Exercises - Sit to Stand  - 1 x daily - 7 x weekly - 2 sets - 10 reps - Standing Hip Abduction with Counter Support  - 1 x daily - 7 x weekly - 2 sets - 10 reps - Standing Hip Extension with Counter Support  - 1 x daily - 7 x weekly - 2 sets - 10 reps - Standing March with Counter Support  - 1 x daily - 7 x weekly - 2 sets - 10 reps - Heel Raises with Counter Support  - 1 x daily - 7 x weekly - 2 sets - 10 reps  ASSESSMENT:  CLINICAL IMPRESSION: Pt had a nice aquatic session today. He maintained his balance throughout the entire session whereas he regularly has to regain his balance. No knee pain and no excessive fatigue. Rt hip will fatigue first which seems to be what affects his balance.   OBJECTIVE IMPAIRMENTS: decreased balance, difficulty walking, decreased strength, impaired flexibility, postural dysfunction, and pain.   ACTIVITY LIMITATIONS: carrying, lifting, bending, standing, squatting, and stairs  PARTICIPATION LIMITATIONS: cleaning and community activity  PERSONAL FACTORS: Past/current experiences, Time since onset of injury/illness/exacerbation, and 3+ comorbidities: subdural hematoma, HTN, OA  are also affecting patient's functional outcome.    REHAB POTENTIAL: Good  CLINICAL DECISION MAKING: Evolving/moderate complexity  EVALUATION COMPLEXITY: Moderate   GOALS: Goals reviewed with patient? Yes  SHORT TERM GOALS: Target date: 02/01/2023 Pt will be independent with initial HEP. Baseline: Goal status: MET  2.  Pt will improve ambulation safety with least restrictive assistive device to allow him to safely ambulate distances required for granddaughter's wedding on 02/02/2023. Baseline:  Goal status: Met   LONG TERM GOALS: Target date: 06/28/2023   Pt will be independent with advanced HEP to allow for self progression post discharge. Baseline:  Goal status: Ongoing  2.  Patient will increase Lower Extremity Functional Scale to at least 50% to demonstrate improvement in functional tasks. Baseline: 25% Goal status: MET on 04/29/2023  3.  Patient will increase BERG to at least 42/56 to demonstrate decreased risk of falling. Baseline: 27/56 Goal status: Ongoing (see above)  4.  Pt will report being able to ambulate at least 20 minutes with LRAD without increased pain and no loss of balance to allow for community ambulation. Baseline:  Goal status: Ongoing  5.  Pt will increase LE strength to at least 4+/5 to allow him to perform sit to stand without UE use and navigate a curb with LRAD safely. Baseline:  Goal status: Ongoing   PLAN:  PT FREQUENCY: 2x/week  PT DURATION: 8 weeks  PLANNED INTERVENTIONS: Therapeutic exercises, Therapeutic activity, Neuromuscular re-education, Balance training, Gait training, Patient/Family education, Self Care, Joint mobilization, Joint manipulation, Stair training, Vestibular training, Canalith repositioning, Aquatic Therapy, Dry Needling, Electrical stimulation, Spinal manipulation, Spinal mobilization, Cryotherapy, Moist heat, Taping, Traction, Ultrasound, Ionotophoresis 4mg /ml Dexamethasone, Manual therapy, and Re-evaluation  PLAN FOR NEXT SESSION: Strengthening, balance,  flexibility, focus on proximal hip and core strength, aquatics    Ane Payment, PTA 05/31/23 3:12 PM   [facility] [address] [place], [place] [address] Phone # [phone] Fax [phone]

## 2023-05-31 ENCOUNTER — Ambulatory Visit: Payer: Medicare HMO | Admitting: Physical Therapy

## 2023-05-31 ENCOUNTER — Encounter: Payer: Self-pay | Admitting: Physical Therapy

## 2023-05-31 DIAGNOSIS — C44629 Squamous cell carcinoma of skin of left upper limb, including shoulder: Secondary | ICD-10-CM | POA: Diagnosis not present

## 2023-05-31 DIAGNOSIS — R262 Difficulty in walking, not elsewhere classified: Secondary | ICD-10-CM

## 2023-05-31 DIAGNOSIS — C44321 Squamous cell carcinoma of skin of nose: Secondary | ICD-10-CM | POA: Diagnosis not present

## 2023-05-31 DIAGNOSIS — D485 Neoplasm of uncertain behavior of skin: Secondary | ICD-10-CM | POA: Diagnosis not present

## 2023-05-31 DIAGNOSIS — R293 Abnormal posture: Secondary | ICD-10-CM | POA: Diagnosis not present

## 2023-05-31 DIAGNOSIS — R2689 Other abnormalities of gait and mobility: Secondary | ICD-10-CM

## 2023-05-31 DIAGNOSIS — M6281 Muscle weakness (generalized): Secondary | ICD-10-CM

## 2023-05-31 DIAGNOSIS — R2681 Unsteadiness on feet: Secondary | ICD-10-CM | POA: Diagnosis not present

## 2023-05-31 DIAGNOSIS — Z86007 Personal history of in-situ neoplasm of skin: Secondary | ICD-10-CM | POA: Diagnosis not present

## 2023-05-31 DIAGNOSIS — L821 Other seborrheic keratosis: Secondary | ICD-10-CM | POA: Diagnosis not present

## 2023-05-31 DIAGNOSIS — D229 Melanocytic nevi, unspecified: Secondary | ICD-10-CM | POA: Diagnosis not present

## 2023-05-31 DIAGNOSIS — R229 Localized swelling, mass and lump, unspecified: Secondary | ICD-10-CM | POA: Diagnosis not present

## 2023-05-31 DIAGNOSIS — L814 Other melanin hyperpigmentation: Secondary | ICD-10-CM | POA: Diagnosis not present

## 2023-05-31 DIAGNOSIS — Z08 Encounter for follow-up examination after completed treatment for malignant neoplasm: Secondary | ICD-10-CM | POA: Diagnosis not present

## 2023-06-04 ENCOUNTER — Ambulatory Visit: Payer: Medicare HMO | Admitting: Rehabilitative and Restorative Service Providers"

## 2023-06-04 ENCOUNTER — Encounter: Payer: Self-pay | Admitting: Rehabilitative and Restorative Service Providers"

## 2023-06-04 DIAGNOSIS — M6281 Muscle weakness (generalized): Secondary | ICD-10-CM

## 2023-06-04 DIAGNOSIS — R2689 Other abnormalities of gait and mobility: Secondary | ICD-10-CM | POA: Diagnosis not present

## 2023-06-04 DIAGNOSIS — R293 Abnormal posture: Secondary | ICD-10-CM | POA: Diagnosis not present

## 2023-06-04 DIAGNOSIS — R2681 Unsteadiness on feet: Secondary | ICD-10-CM | POA: Diagnosis not present

## 2023-06-04 DIAGNOSIS — R262 Difficulty in walking, not elsewhere classified: Secondary | ICD-10-CM

## 2023-06-04 NOTE — Therapy (Signed)
OUTPATIENT PHYSICAL THERAPY TREATMENT NOTE  Patient Name: Brandon [name] MRN: [id number] DOB:02-07-1946, 78 y.o., male Today's Date: 06/04/2023    END OF SESSION:  PT End of Session - 06/04/23 1150     Visit Number 30    Date for PT Re-Evaluation 06/28/23    Authorization Type [employer]    Authorization Time Period 04/22/2023 - 06/28/2023    Authorization - Visit Number 10    Authorization - Number of Visits 20    Progress Note Due on Visit 32    PT Start Time 1100    PT Stop Time 1140    PT Time Calculation (min) 40 min    Activity Tolerance Patient tolerated treatment well    Behavior During Therapy WFL for tasks assessed/performed                     Past Medical History:  Diagnosis Date   Allergy    Basal cell carcinoma    GERD (gastroesophageal reflux disease)    History of kidney cancer    benign; told not cancerous   Hypertension    Past Surgical History:  Procedure Laterality Date   CATARACT EXTRACTION Bilateral    CRANIOTOMY Bilateral 12/25/2021   Procedure: CRANIOTOMY HEMATOMA EVACUATION SUBDURAL;  Surgeon: Julio Sicks, MD;  Location: [facility] OR;  Service: Neurosurgery;  Laterality: Bilateral;   HERNIA REPAIR     x2   KIDNEY SURGERY Right    Had right kidney removed.    TONSILLECTOMY     VASECTOMY     Patient Active Problem List   Diagnosis Date Noted   Cognitive and neurobehavioral dysfunction following brain injury (HCC) 02/07/2022   Gait difficulty 02/07/2022   Traumatic brain injury with loss of consciousness (HCC) 12/30/2021   SDH (subdural hematoma) (HCC) 12/24/2021   Hyperlipidemia 02/13/2018   BPH (benign prostatic hyperplasia) 01/31/2017   Heart murmur 01/20/2016   HTN (hypertension) 03/09/2012    PCP: Shirline Frees, NP  REFERRING PROVIDER: Shirline Frees, NP  REFERRING DIAG: R26.81 (ICD-10-CM) - Gait instability  THERAPY DIAG:  Muscle weakness (generalized)  Balance problem  Unsteadiness on feet  Difficulty in  walking, not elsewhere classified  Abnormal posture  Rationale for Evaluation and Treatment: Rehabilitation  ONSET DATE: Pt and wife report that frequent falls have been occurring for the past year  SUBJECTIVE:   SUBJECTIVE STATEMENT:  No reports no new falls or complaints.  PERTINENT HISTORY: Subdural Hematoma, HTN, GERD, Basal Cell Carcinoma, Hx of benign kidney mass, OA  PAIN:  Are you having pain? no: NPRS scale: 0-2/10 Pain location: bilateral knees Pain description: sore Aggravating factors: unknown Relieving factors: Voltaren gel  PRECAUTIONS: Fall  RED FLAGS: None   WEIGHT BEARING RESTRICTIONS: No  FALLS:  Has patient fallen in last 6 months? Yes. Number of falls 3  LIVING ENVIRONMENT: Lives with: lives with their spouse Lives in: House/apartment Stairs:  one level home, one step to enter porch Has following equipment at home: Single point cane, Environmental consultant - 2 wheeled, Environmental consultant - 4 wheeled, shower chair, and Grab bars  OCCUPATION: retired  PLOF: Requires assistive device for independence and Leisure: walking with wife, going out to eat, movies  PATIENT GOALS: To be able to walk into wedding venue for granddaughter's wedding on September 21st, 2024.  NEXT MD VISIT: Shirline Frees on 07/01/2022  OBJECTIVE:   DIAGNOSTIC FINDINGS: Head CT on 04/27/2023: IMPRESSION: 1. Stable 6 mm acute on chronic left subdural hematoma. No associated midline shift.  2. Stable 6 mm chronic right subdural hematoma. 3. No new intracranial abnormality. 4. No acute displaced fracture or traumatic listhesis of the cervical spine. 5. Persistent right apical subpleural 1.5 cm ground-glass nodular-like airspace opacity (increased in size from 2018). Adenocarcinoma is not excluded. Additional imaging evaluation or consultation with Pulmonology or Thoracic Surgery recommended.  PATIENT SURVEYS:  Eval:  LEFS 20 / 80 = 25.0 % 03/05/2023:  Lower Extremity Functional Score: 35 / 80 = 43.8  % 04/29/2023:  Lower Extremity Functional Score: 59 / 80 = 73.8 %  COGNITION: Overall cognitive status:  wife reports occasionally pt has word finding problems and some memory impairments      SENSATION: Wife reports that sometimes left hand has "spasms"  MUSCLE LENGTH: Hamstrings: Tightness bilaterally  POSTURE: rounded shoulders, forward head, and flexed trunk    LOWER EXTREMITY ROM:  WFL  LOWER EXTREMITY MMT:  Eval: Right LE strength grossly 4-/5 Left LE strength grossly 4 to 4+/5 throughout  FUNCTIONAL TESTS:  Eval:   5 times sit to stand: 14.96 sec with UE use required Timed up and go (TUG): 13.71 sec without UE device with unsteadiness noted especially with 180 degree turn BERG:  27/56  01/24/2023: 3 minute walk:  378 ft with SPC (pt with shuffling gait pattern)  02/26/2023: Timed up and go (TUG): 11.69 sec 5 times sit to stand: 13.28 sec with UE use required 3 minute walk: 470 ft without assistive device  03/05/2023: BERG: 39/56  04/02/2023: 3 minute walk: 454 ft  04/09/2023: Timed up and go (TUG): 10.16 sec 5 times sit to stand: 15.81 sec first time, 13.94 sec second time 3 minute walk: 499 ft  04/22/2023: 6 minute walk:  955 ft  04/29/2023: 5 times sit to stand: 18.61 sec first test, 16.99 sec second time BERG: 41/56 6 minute walk: 980 ft  06/04/2023: 6 minute walk:  980 ft  GAIT: Distance walked: >200 ft Assistive device utilized: Single point cane Level of assistance: SBA Comments: Pt with unsteady gait noted and requires SBA   TODAY'S TREATMENT:    DATE: 06/04/2023 Nustep level 5 x6 min with PT present to discuss status Sit to/from stand on chair with foam pad to elevate surface 2x6 without UE support Seated LAQ, marching, and hip ER with 6# on ankles 2x10 each bilat Standing with 6# on bilat ankles: hip abduction, hip extension, high marching.  2x10 each bilat 6 minute walk:  980 ft Leg Press (seat at 9) 125# 2x10 Retrogait with  15# cable pulley x10 Alt LE toe tap to 2nd step without UE support 2x10   05/31/23:Pt arrives for aquatic physical therapy. Treatment took place in 3.5-5.5 feet of water. Water temperature was 92 degrees F. Pt entered the pool via stairs step to step with heavy use of the rails. Pt requires buoyancy of water for support and to offload joints with strengthening exercises.   Seated water bench with 75% submersion Pt performed seated LE AROM exercises with concurrent discussion of current status and pain assessment. 75% depth water walking with UE floats for push/pull/ 10x each direction, some LOB but pt could self correct. Hip 3 ways 20x Bil with UE assit on side of pool  No UE support for heel raises 10x2. High knee forward marching 6 lengths of pool holding on the yellow noodle. Tandem walking (slightly wider than heel-toe to increase success) 4 lengths holding hand floats. Static stance with small step forward and UE horizontal adb/add with green water bells  2x10 Bil then forward and back 2x10. RTLE step ups 2x15, short seated rest break d/t LE fatigue. Steps up RT 2x10 both holding rails and not holding rails. Alt toe taps to second step 2x10 no UE support.      DATE: 05/29/2023 Nustep level 5 x6 min with PT present to discuss status Seated LAQ, marching, and hip ER with 6# on ankles 2x10 each bilat Sit to/from stand on chair with foam pad to elevate surface 2x6 without UE support Standing with 5# on bilat ankles: hip abduction, hip extension, high marching.  2x10 each bilat Ambulation with 5# on bilat ankles down to cancer gym and back x2 with seated recovery period in between Seated hamstring stretch 2x20 sec bilat Leg Press (seat at 9) 125# 2x10 Retrogait with 15# cable pulley x10 Alt LE toe tap to 6" step without UE support 2x10    PATIENT EDUCATION:  Education details: aquatic exercise technique and rationale Person educated: Patient and Spouse Education method: Programmer, multimedia,  Facilities manager,  Education comprehension: verbalized understanding  HOME EXERCISE PROGRAM: Access Code: [email or web address] URL: [email or web address] Date: 01/10/2023 Prepared by: Clydie Braun Jalayla Chrismer  Exercises - Sit to Stand  - 1 x daily - 7 x weekly - 2 sets - 10 reps - Standing Hip Abduction with Counter Support  - 1 x daily - 7 x weekly - 2 sets - 10 reps - Standing Hip Extension with Counter Support  - 1 x daily - 7 x weekly - 2 sets - 10 reps - Standing March with Counter Support  - 1 x daily - 7 x weekly - 2 sets - 10 reps - Heel Raises with Counter Support  - 1 x daily - 7 x weekly - 2 sets - 10 reps  ASSESSMENT:  CLINICAL IMPRESSION:  Mr Stuver presents to skilled PT with no new complaints.  Patient improving with balance and able to perform to tap to the 2nd step instead of the first one.  Patient with same distance on 6 minute walk test today as last time assessed.  Patient is having less foot shuffling with ambulation during 6 minute walk test today.  Patient continues to require skilled PT to progress towards goal related activities.   OBJECTIVE IMPAIRMENTS: decreased balance, difficulty walking, decreased strength, impaired flexibility, postural dysfunction, and pain.   ACTIVITY LIMITATIONS: carrying, lifting, bending, standing, squatting, and stairs  PARTICIPATION LIMITATIONS: cleaning and community activity  PERSONAL FACTORS: Past/current experiences, Time since onset of injury/illness/exacerbation, and 3+ comorbidities: subdural hematoma, HTN, OA  are also affecting patient's functional outcome.   REHAB POTENTIAL: Good  CLINICAL DECISION MAKING: Evolving/moderate complexity  EVALUATION COMPLEXITY: Moderate   GOALS: Goals reviewed with patient? Yes  SHORT TERM GOALS: Target date: 02/01/2023 Pt will be independent with initial HEP. Baseline: Goal status: MET  2.  Pt will improve ambulation safety with least restrictive assistive device to allow him to safely  ambulate distances required for granddaughter's wedding on 02/02/2023. Baseline:  Goal status: Met   LONG TERM GOALS: Target date: 06/28/2023   Pt will be independent with advanced HEP to allow for self progression post discharge. Baseline:  Goal status: Ongoing  2.  Patient will increase Lower Extremity Functional Scale to at least 50% to demonstrate improvement in functional tasks. Baseline: 25% Goal status: MET on 04/29/2023  3.  Patient will increase BERG to at least 42/56 to demonstrate decreased risk of falling. Baseline: 27/56 Goal status: Ongoing (see above)  4.  Pt will report being able to ambulate  at least 20 minutes with LRAD without increased pain and no loss of balance to allow for community ambulation. Baseline:  Goal status: Ongoing  5.  Pt will increase LE strength to at least 4+/5 to allow him to perform sit to stand without UE use and navigate a curb with LRAD safely. Baseline:  Goal status: Ongoing   PLAN:  PT FREQUENCY: 2x/week  PT DURATION: 8 weeks  PLANNED INTERVENTIONS: Therapeutic exercises, Therapeutic activity, Neuromuscular re-education, Balance training, Gait training, Patient/Family education, Self Care, Joint mobilization, Joint manipulation, Stair training, Vestibular training, Canalith repositioning, Aquatic Therapy, Dry Needling, Electrical stimulation, Spinal manipulation, Spinal mobilization, Cryotherapy, Moist heat, Taping, Traction, Ultrasound, Ionotophoresis 4mg /ml Dexamethasone, Manual therapy, and Re-evaluation  PLAN FOR NEXT SESSION: Strengthening, balance, flexibility, focus on proximal hip and core strength, aquatics    Reather Laurence, PT, DPT 06/04/23, 1:23 PM  [facility] [address] [place], [place] [address] Phone # [phone] Fax [phone]

## 2023-06-06 NOTE — Therapy (Signed)
OUTPATIENT PHYSICAL THERAPY TREATMENT NOTE  Patient Name: Brandon [name] MRN: [id number] DOB:Jun 24, 1945, 78 y.o., male Today's Date: 06/07/2023    END OF SESSION:  PT End of Session - 06/07/23 1208     Visit Number 31    Date for PT Re-Evaluation 06/28/23    Authorization Type [employer]    Authorization Time Period 04/22/2023 - 06/28/2023    Authorization - Visit Number 12    Authorization - Number of Visits 20    Progress Note Due on Visit 32    PT Start Time 1208    PT Stop Time 1255    PT Time Calculation (min) 47 min    Activity Tolerance Patient tolerated treatment well    Behavior During Therapy WFL for tasks assessed/performed                      Past Medical History:  Diagnosis Date   Allergy    Basal cell carcinoma    GERD (gastroesophageal reflux disease)    History of kidney cancer    benign; told not cancerous   Hypertension    Past Surgical History:  Procedure Laterality Date   CATARACT EXTRACTION Bilateral    CRANIOTOMY Bilateral 12/25/2021   Procedure: CRANIOTOMY HEMATOMA EVACUATION SUBDURAL;  Surgeon: Julio Sicks, MD;  Location: [facility] OR;  Service: Neurosurgery;  Laterality: Bilateral;   HERNIA REPAIR     x2   KIDNEY SURGERY Right    Had right kidney removed.    TONSILLECTOMY     VASECTOMY     Patient Active Problem List   Diagnosis Date Noted   Cognitive and neurobehavioral dysfunction following brain injury (HCC) 02/07/2022   Gait difficulty 02/07/2022   Traumatic brain injury with loss of consciousness (HCC) 12/30/2021   SDH (subdural hematoma) (HCC) 12/24/2021   Hyperlipidemia 02/13/2018   BPH (benign prostatic hyperplasia) 01/31/2017   Heart murmur 01/20/2016   HTN (hypertension) 03/09/2012    PCP: Shirline Frees, NP  REFERRING PROVIDER: Shirline Frees, NP  REFERRING DIAG: R26.81 (ICD-10-CM) - Gait instability  THERAPY DIAG:  Muscle weakness (generalized)  Balance problem  Unsteadiness on feet  Abnormal  posture  Difficulty in walking, not elsewhere classified  Rationale for Evaluation and Treatment: Rehabilitation  ONSET DATE: Pt and wife report that frequent falls have been occurring for the past year  SUBJECTIVE:   SUBJECTIVE STATEMENT: No new complaints  PERTINENT HISTORY: Subdural Hematoma, HTN, GERD, Basal Cell Carcinoma, Hx of benign kidney mass, OA  PAIN:  Are you having pain? no: NPRS scale: 0-2/10 Pain location: bilateral knees Pain description: sore Aggravating factors: unknown Relieving factors: Voltaren gel  PRECAUTIONS: Fall  RED FLAGS: None   WEIGHT BEARING RESTRICTIONS: No  FALLS:  Has patient fallen in last 6 months? Yes. Number of falls 3  LIVING ENVIRONMENT: Lives with: lives with their spouse Lives in: House/apartment Stairs:  one level home, one step to enter porch Has following equipment at home: Single point cane, Environmental consultant - 2 wheeled, Environmental consultant - 4 wheeled, shower chair, and Grab bars  OCCUPATION: retired  PLOF: Requires assistive device for independence and Leisure: walking with wife, going out to eat, movies  PATIENT GOALS: To be able to walk into wedding venue for granddaughter's wedding on September 21st, 2024.  NEXT MD VISIT: Shirline Frees on 07/01/2022  OBJECTIVE:   DIAGNOSTIC FINDINGS: Head CT on 04/27/2023: IMPRESSION: 1. Stable 6 mm acute on chronic left subdural hematoma. No associated midline shift. 2. Stable 6 mm  chronic right subdural hematoma. 3. No new intracranial abnormality. 4. No acute displaced fracture or traumatic listhesis of the cervical spine. 5. Persistent right apical subpleural 1.5 cm ground-glass nodular-like airspace opacity (increased in size from 2018). Adenocarcinoma is not excluded. Additional imaging evaluation or consultation with Pulmonology or Thoracic Surgery recommended.  PATIENT SURVEYS:  Eval:  LEFS 20 / 80 = 25.0 % 03/05/2023:  Lower Extremity Functional Score: 35 / 80 = 43.8 % 04/29/2023:   Lower Extremity Functional Score: 59 / 80 = 73.8 %  COGNITION: Overall cognitive status:  wife reports occasionally pt has word finding problems and some memory impairments      SENSATION: Wife reports that sometimes left hand has "spasms"  MUSCLE LENGTH: Hamstrings: Tightness bilaterally  POSTURE: rounded shoulders, forward head, and flexed trunk    LOWER EXTREMITY ROM:  WFL  LOWER EXTREMITY MMT:  Eval: Right LE strength grossly 4-/5 Left LE strength grossly 4 to 4+/5 throughout  FUNCTIONAL TESTS:  Eval:   5 times sit to stand: 14.96 sec with UE use required Timed up and go (TUG): 13.71 sec without UE device with unsteadiness noted especially with 180 degree turn BERG:  27/56  01/24/2023: 3 minute walk:  378 ft with SPC (pt with shuffling gait pattern)  02/26/2023: Timed up and go (TUG): 11.69 sec 5 times sit to stand: 13.28 sec with UE use required 3 minute walk: 470 ft without assistive device  03/05/2023: BERG: 39/56  04/02/2023: 3 minute walk: 454 ft  04/09/2023: Timed up and go (TUG): 10.16 sec 5 times sit to stand: 15.81 sec first time, 13.94 sec second time 3 minute walk: 499 ft  04/22/2023: 6 minute walk:  955 ft  04/29/2023: 5 times sit to stand: 18.61 sec first test, 16.99 sec second time BERG: 41/56 6 minute walk: 980 ft  06/04/2023: 6 minute walk:  980 ft  GAIT: Distance walked: >200 ft Assistive device utilized: Single point cane Level of assistance: SBA Comments: Pt with unsteady gait noted and requires SBA   TODAY'S TREATMENT:    06/07/23:Pt arrives for aquatic physical therapy. Treatment took place in 3.5-5.5 feet of water. Water temperature was 92 degrees F. Pt entered the pool via stairs step to step with heavy use of the rails. Pt requires buoyancy of water for support and to offload joints with strengthening exercises.   Seated water bench with 75% submersion Pt performed seated LE AROM exercises with concurrent discussion of  current status and pain assessment. 75% depth water walking with UE floats for push/pull/ 10x each direction, some LOB but pt could self correct. Hip 3 ways 20x Bil with UE assit on side of pool  No UE support for heel raises 10x2. High knee forward marching 6 lengths of pool holding on the yellow noodle. Tandem walking (slightly wider than heel-toe to increase success) 4 lengths holding hand floats. Static stance with small step forward and UE horizontal adb/add with green water bells 2x10 Bil then forward and back 2x10. RTLE step ups 2x15, short seated rest break d/t LE fatigue. Steps up RT 2x10 both holding rails and not holding rails. Alt toe taps to second step 2x15 no UE support.      DATE: 06/04/2023 Nustep level 5 x6 min with PT present to discuss status Sit to/from stand on chair with foam pad to elevate surface 2x6 without UE support Seated LAQ, marching, and hip ER with 6# on ankles 2x10 each bilat Standing with 6# on bilat ankles: hip abduction,  hip extension, high marching.  2x10 each bilat 6 minute walk:  980 ft Leg Press (seat at 9) 125# 2x10 Retrogait with 15# cable pulley x10 Alt LE toe tap to 2nd step without UE support 2x10   05/31/23:Pt arrives for aquatic physical therapy. Treatment took place in 3.5-5.5 feet of water. Water temperature was 92 degrees F. Pt entered the pool via stairs step to step with heavy use of the rails. Pt requires buoyancy of water for support and to offload joints with strengthening exercises.   Seated water bench with 75% submersion Pt performed seated LE AROM exercises with concurrent discussion of current status and pain assessment. 75% depth water walking with UE floats for push/pull/ 10x each direction, some LOB but pt could self correct. Hip 3 ways 20x Bil with UE assit on side of pool  No UE support for heel raises 10x2. High knee forward marching 6 lengths of pool holding on the yellow noodle. Tandem walking (slightly wider than heel-toe to  increase success) 4 lengths holding hand floats. Static stance with small step forward and UE horizontal adb/add with green water bells 2x10 Bil then forward and back 2x10. RTLE step ups 2x15, short seated rest break d/t LE fatigue. Steps up RT 2x10 both holding rails and not holding rails. Alt toe taps to second step 2x10 no UE support.       PATIENT EDUCATION:  Education details: aquatic exercise technique and rationale Person educated: Patient and Spouse Education method: Explanation, Facilities manager,  Education comprehension: verbalized understanding  HOME EXERCISE PROGRAM: Access Code: [email or web address] URL: [email or web address] Date: 01/10/2023 Prepared by: Clydie Braun Menke  Exercises - Sit to Stand  - 1 x daily - 7 x weekly - 2 sets - 10 reps - Standing Hip Abduction with Counter Support  - 1 x daily - 7 x weekly - 2 sets - 10 reps - Standing Hip Extension with Counter Support  - 1 x daily - 7 x weekly - 2 sets - 10 reps - Standing March with Counter Support  - 1 x daily - 7 x weekly - 2 sets - 10 reps - Heel Raises with Counter Support  - 1 x daily - 7 x weekly - 2 sets - 10 reps  ASSESSMENT:  CLINICAL IMPRESSION: Pt arrives for aquatic PT treatment session with essentially no pain, maybe mild RT knee pain earlier. Main focus in the water continues to be balance and hip strength/stability. Fatigue is the main barrier, when his legs get tired he gets more unstable. Pt had major LOB during tandem walking where PTA had to assit him in regaining his balance and getting to his feet.   OBJECTIVE IMPAIRMENTS: decreased balance, difficulty walking, decreased strength, impaired flexibility, postural dysfunction, and pain.   ACTIVITY LIMITATIONS: carrying, lifting, bending, standing, squatting, and stairs  PARTICIPATION LIMITATIONS: cleaning and community activity  PERSONAL FACTORS: Past/current experiences, Time since onset of injury/illness/exacerbation, and 3+ comorbidities:  subdural hematoma, HTN, OA  are also affecting patient's functional outcome.   REHAB POTENTIAL: Good  CLINICAL DECISION MAKING: Evolving/moderate complexity  EVALUATION COMPLEXITY: Moderate   GOALS: Goals reviewed with patient? Yes  SHORT TERM GOALS: Target date: 02/01/2023 Pt will be independent with initial HEP. Baseline: Goal status: MET  2.  Pt will improve ambulation safety with least restrictive assistive device to allow him to safely ambulate distances required for granddaughter's wedding on 02/02/2023. Baseline:  Goal status: Met   LONG TERM GOALS: Target date: 06/28/2023   Pt will be independent with  advanced HEP to allow for self progression post discharge. Baseline:  Goal status: Ongoing  2.  Patient will increase Lower Extremity Functional Scale to at least 50% to demonstrate improvement in functional tasks. Baseline: 25% Goal status: MET on 04/29/2023  3.  Patient will increase BERG to at least 42/56 to demonstrate decreased risk of falling. Baseline: 27/56 Goal status: Ongoing (see above)  4.  Pt will report being able to ambulate at least 20 minutes with LRAD without increased pain and no loss of balance to allow for community ambulation. Baseline:  Goal status: Ongoing  5.  Pt will increase LE strength to at least 4+/5 to allow him to perform sit to stand without UE use and navigate a curb with LRAD safely. Baseline:  Goal status: Ongoing   PLAN:  PT FREQUENCY: 2x/week  PT DURATION: 8 weeks  PLANNED INTERVENTIONS: Therapeutic exercises, Therapeutic activity, Neuromuscular re-education, Balance training, Gait training, Patient/Family education, Self Care, Joint mobilization, Joint manipulation, Stair training, Vestibular training, Canalith repositioning, Aquatic Therapy, Dry Needling, Electrical stimulation, Spinal manipulation, Spinal mobilization, Cryotherapy, Moist heat, Taping, Traction, Ultrasound, Ionotophoresis 4mg /ml Dexamethasone, Manual  therapy, and Re-evaluation  PLAN FOR NEXT SESSION: Strengthening, balance, flexibility, focus on proximal hip and core strength, aquatics  Ane Payment, PTA 06/07/23 1:02 PM   [facility] [address], [address] [place], [place] [address] Phone # [phone] Fax [phone]

## 2023-06-07 ENCOUNTER — Encounter: Payer: Self-pay | Admitting: Physical Therapy

## 2023-06-07 ENCOUNTER — Ambulatory Visit: Payer: Medicare HMO | Admitting: Physical Therapy

## 2023-06-07 DIAGNOSIS — M6281 Muscle weakness (generalized): Secondary | ICD-10-CM

## 2023-06-07 DIAGNOSIS — R262 Difficulty in walking, not elsewhere classified: Secondary | ICD-10-CM

## 2023-06-07 DIAGNOSIS — R2681 Unsteadiness on feet: Secondary | ICD-10-CM | POA: Diagnosis not present

## 2023-06-07 DIAGNOSIS — R2689 Other abnormalities of gait and mobility: Secondary | ICD-10-CM

## 2023-06-07 DIAGNOSIS — R293 Abnormal posture: Secondary | ICD-10-CM

## 2023-06-11 ENCOUNTER — Ambulatory Visit: Payer: Medicare HMO | Admitting: Rehabilitative and Restorative Service Providers"

## 2023-06-11 ENCOUNTER — Encounter: Payer: Self-pay | Admitting: Rehabilitative and Restorative Service Providers"

## 2023-06-11 DIAGNOSIS — R293 Abnormal posture: Secondary | ICD-10-CM | POA: Diagnosis not present

## 2023-06-11 DIAGNOSIS — R2689 Other abnormalities of gait and mobility: Secondary | ICD-10-CM | POA: Diagnosis not present

## 2023-06-11 DIAGNOSIS — R2681 Unsteadiness on feet: Secondary | ICD-10-CM

## 2023-06-11 DIAGNOSIS — M6281 Muscle weakness (generalized): Secondary | ICD-10-CM

## 2023-06-11 DIAGNOSIS — R262 Difficulty in walking, not elsewhere classified: Secondary | ICD-10-CM | POA: Diagnosis not present

## 2023-06-11 NOTE — Therapy (Signed)
OUTPATIENT PHYSICAL THERAPY TREATMENT NOTE  Patient Name: Brandon [name] MRN: [id number] DOB:1945/07/15, 78 y.o., male Today's Date: 06/11/2023   Progress Note Reporting Period 04/29/2023 to 06/11/2023  See note below for Objective Data and Assessment of Progress/Goals.       END OF SESSION:  PT End of Session - 06/11/23 1130     Visit Number 32    Date for PT Re-Evaluation 06/28/23    Authorization Type [employer]    Authorization Time Period 04/22/2023 - 06/28/2023    Authorization - Visit Number 13    Authorization - Number of Visits 20    Progress Note Due on Visit 42    PT Start Time 1128    PT Stop Time 1210    PT Time Calculation (min) 42 min    Activity Tolerance Patient tolerated treatment well    Behavior During Therapy WFL for tasks assessed/performed                      Past Medical History:  Diagnosis Date   Allergy    Basal cell carcinoma    GERD (gastroesophageal reflux disease)    History of kidney cancer    benign; told not cancerous   Hypertension    Past Surgical History:  Procedure Laterality Date   CATARACT EXTRACTION Bilateral    CRANIOTOMY Bilateral 12/25/2021   Procedure: CRANIOTOMY HEMATOMA EVACUATION SUBDURAL;  Surgeon: Julio Sicks, MD;  Location: [facility] OR;  Service: Neurosurgery;  Laterality: Bilateral;   HERNIA REPAIR     x2   KIDNEY SURGERY Right    Had right kidney removed.    TONSILLECTOMY     VASECTOMY     Patient Active Problem List   Diagnosis Date Noted   Cognitive and neurobehavioral dysfunction following brain injury (HCC) 02/07/2022   Gait difficulty 02/07/2022   Traumatic brain injury with loss of consciousness (HCC) 12/30/2021   SDH (subdural hematoma) (HCC) 12/24/2021   Hyperlipidemia 02/13/2018   BPH (benign prostatic hyperplasia) 01/31/2017   Heart murmur 01/20/2016   HTN (hypertension) 03/09/2012    PCP: Shirline Frees, NP  REFERRING PROVIDER: Shirline Frees, NP  REFERRING DIAG: R26.81  (ICD-10-CM) - Gait instability  THERAPY DIAG:  Muscle weakness (generalized)  Balance problem  Unsteadiness on feet  Abnormal posture  Difficulty in walking, not elsewhere classified  Rationale for Evaluation and Treatment: Rehabilitation  ONSET DATE: Pt and wife report that frequent falls have been occurring for the past year  SUBJECTIVE:   SUBJECTIVE STATEMENT:  Pt continues to deny any new falls.  States that he still has his low level knee pain of 4/10 this morning.  PERTINENT HISTORY: Subdural Hematoma, HTN, GERD, Basal Cell Carcinoma, Hx of benign kidney mass, OA  PAIN:  Are you having pain? no: NPRS scale: 0-4/10 Pain location: bilateral knees Pain description: sore Aggravating factors: unknown Relieving factors: Voltaren gel  PRECAUTIONS: Fall  RED FLAGS: None   WEIGHT BEARING RESTRICTIONS: No  FALLS:  Has patient fallen in last 6 months? Yes. Number of falls 3  LIVING ENVIRONMENT: Lives with: lives with their spouse Lives in: House/apartment Stairs:  one level home, one step to enter porch Has following equipment at home: Single point cane, Environmental consultant - 2 wheeled, Environmental consultant - 4 wheeled, shower chair, and Grab bars  OCCUPATION: retired  PLOF: Requires assistive device for independence and Leisure: walking with wife, going out to eat, movies  PATIENT GOALS: To be able to walk into wedding venue for  granddaughter's wedding on September 21st, 2024.  NEXT MD VISIT: Shirline Frees on 07/01/2022  OBJECTIVE:   DIAGNOSTIC FINDINGS: Head CT on 04/27/2023: IMPRESSION: 1. Stable 6 mm acute on chronic left subdural hematoma. No associated midline shift. 2. Stable 6 mm chronic right subdural hematoma. 3. No new intracranial abnormality. 4. No acute displaced fracture or traumatic listhesis of the cervical spine. 5. Persistent right apical subpleural 1.5 cm ground-glass nodular-like airspace opacity (increased in size from 2018). Adenocarcinoma is not excluded.  Additional imaging evaluation or consultation with Pulmonology or Thoracic Surgery recommended.  PATIENT SURVEYS:  Eval:  LEFS 20 / 80 = 25.0 % 03/05/2023:  Lower Extremity Functional Score: 35 / 80 = 43.8 % 04/29/2023:  Lower Extremity Functional Score: 59 / 80 = 73.8 %  COGNITION: Overall cognitive status:  wife reports occasionally pt has word finding problems and some memory impairments      SENSATION: Wife reports that sometimes left hand has "spasms"  MUSCLE LENGTH: Hamstrings: Tightness bilaterally  POSTURE: rounded shoulders, forward head, and flexed trunk    LOWER EXTREMITY ROM:  WFL  LOWER EXTREMITY MMT:  Eval: Right LE strength grossly 4-/5 Left LE strength grossly 4 to 4+/5 throughout  FUNCTIONAL TESTS:  Eval:   5 times sit to stand: 14.96 sec with UE use required Timed up and go (TUG): 13.71 sec without UE device with unsteadiness noted especially with 180 degree turn BERG:  27/56  01/24/2023: 3 minute walk:  378 ft with SPC (pt with shuffling gait pattern)  02/26/2023: Timed up and go (TUG): 11.69 sec 5 times sit to stand: 13.28 sec with UE use required 3 minute walk: 470 ft without assistive device  03/05/2023: BERG: 39/56  04/02/2023: 3 minute walk: 454 ft  04/09/2023: Timed up and go (TUG): 10.16 sec 5 times sit to stand: 15.81 sec first time, 13.94 sec second time 3 minute walk: 499 ft  04/22/2023: 6 minute walk:  955 ft  04/29/2023: 5 times sit to stand: 18.61 sec first test, 16.99 sec second time BERG: 41/56 6 minute walk: 980 ft  06/04/2023: 6 minute walk:  980 ft  06/11/2023: 5 times sit to stand: 12.07 sec 6 minute walk:  1160 ft  GAIT: Distance walked: >200 ft Assistive device utilized: Single point cane Level of assistance: SBA Comments: Pt with unsteady gait noted and requires SBA   TODAY'S TREATMENT:    DATE: 06/11/2023 Nustep level 5 x6 min with PT present to discuss status  5 times sit to stand 6 minute walk:   1160 ft Seated LAQ, marching, and hip ER with 6# on ankles 2x10 each bilat Sit to/from stand on chair with foam pad to elevate surface 2x6 without UE support Standing with 6# on bilat ankles: hip abduction, hip extension, high marching.  2x10 each bilat Leg Press (seat at 9) 125# 2x10 Retrogait with 15# cable pulley x10 Alt LE toe tap to 2nd step without UE support 2x10 Standing hamstring stretch at stairs 2x20 sec bilat   06/07/23: Pt arrives for aquatic physical therapy. Treatment took place in 3.5-5.5 feet of water. Water temperature was 92 degrees F. Pt entered the pool via stairs step to step with heavy use of the rails. Pt requires buoyancy of water for support and to offload joints with strengthening exercises.   Seated water bench with 75% submersion Pt performed seated LE AROM exercises with concurrent discussion of current status and pain assessment. 75% depth water walking with UE floats for push/pull/ 10x each  direction, some LOB but pt could self correct. Hip 3 ways 20x Bil with UE assit on side of pool  No UE support for heel raises 10x2. High knee forward marching 6 lengths of pool holding on the yellow noodle. Tandem walking (slightly wider than heel-toe to increase success) 4 lengths holding hand floats. Static stance with small step forward and UE horizontal adb/add with green water bells 2x10 Bil then forward and back 2x10. RTLE step ups 2x15, short seated rest break d/t LE fatigue. Steps up RT 2x10 both holding rails and not holding rails. Alt toe taps to second step 2x15 no UE support.      DATE: 06/04/2023 Nustep level 5 x6 min with PT present to discuss status Sit to/from stand on chair with foam pad to elevate surface 2x6 without UE support Seated LAQ, marching, and hip ER with 6# on ankles 2x10 each bilat Standing with 6# on bilat ankles: hip abduction, hip extension, high marching.  2x10 each bilat 6 minute walk:  980 ft Leg Press (seat at 9) 125# 2x10 Retrogait with  15# cable pulley x10 Alt LE toe tap to 2nd step without UE support 2x10   PATIENT EDUCATION:  Education details: aquatic exercise technique and rationale Person educated: Patient and Spouse Education method: Programmer, multimedia, Facilities manager,  Education comprehension: verbalized understanding  HOME EXERCISE PROGRAM: Access Code: [email or web address] URL: [email or web address] Date: 01/10/2023 Prepared by: Clydie Braun Jadesola Poynter  Exercises - Sit to Stand  - 1 x daily - 7 x weekly - 2 sets - 10 reps - Standing Hip Abduction with Counter Support  - 1 x daily - 7 x weekly - 2 sets - 10 reps - Standing Hip Extension with Counter Support  - 1 x daily - 7 x weekly - 2 sets - 10 reps - Standing March with Counter Support  - 1 x daily - 7 x weekly - 2 sets - 10 reps - Heel Raises with Counter Support  - 1 x daily - 7 x weekly - 2 sets - 10 reps  ASSESSMENT:  CLINICAL IMPRESSION:  Mr Dirocco presents to skilled PT with continued denial of falling.  Patient able to participate in objective testing again today and with improvements on both 5 times sit to stand and 6 minute walk.  Patient continues to have tight hamstrings, but is having less shuffling noted with ambulation.  Patient continues to require brief seated recovery periods secondary to fatigue.  Patient continues to progress with increased strengthening and continues to not have any recent falls.  Patient continues to require skilled PT to progress towards goal related activities, but is starting to progress towards meeting goals for possible discharge at end of current certification.    OBJECTIVE IMPAIRMENTS: decreased balance, difficulty walking, decreased strength, impaired flexibility, postural dysfunction, and pain.   ACTIVITY LIMITATIONS: carrying, lifting, bending, standing, squatting, and stairs  PARTICIPATION LIMITATIONS: cleaning and community activity  PERSONAL FACTORS: Past/current experiences, Time since onset of  injury/illness/exacerbation, and 3+ comorbidities: subdural hematoma, HTN, OA  are also affecting patient's functional outcome.   REHAB POTENTIAL: Good  CLINICAL DECISION MAKING: Evolving/moderate complexity  EVALUATION COMPLEXITY: Moderate   GOALS: Goals reviewed with patient? Yes  SHORT TERM GOALS: Target date: 02/01/2023 Pt will be independent with initial HEP. Baseline: Goal status: MET  2.  Pt will improve ambulation safety with least restrictive assistive device to allow him to safely ambulate distances required for granddaughter's wedding on 02/02/2023. Baseline:  Goal status: Met   LONG TERM GOALS: Target  date: 06/28/2023   Pt will be independent with advanced HEP to allow for self progression post discharge. Baseline:  Goal status: Ongoing  2.  Patient will increase Lower Extremity Functional Scale to at least 50% to demonstrate improvement in functional tasks. Baseline: 25% Goal status: MET on 04/29/2023  3.  Patient will increase BERG to at least 42/56 to demonstrate decreased risk of falling. Baseline: 27/56 Goal status: Ongoing (see above)  4.  Pt will report being able to ambulate at least 20 minutes with LRAD without increased pain and no loss of balance to allow for community ambulation. Baseline:  Goal status: Ongoing  5.  Pt will increase LE strength to at least 4+/5 to allow him to perform sit to stand without UE use and navigate a curb with LRAD safely. Baseline:  Goal status: Ongoing   PLAN:  PT FREQUENCY: 2x/week  PT DURATION: 8 weeks  PLANNED INTERVENTIONS: Therapeutic exercises, Therapeutic activity, Neuromuscular re-education, Balance training, Gait training, Patient/Family education, Self Care, Joint mobilization, Joint manipulation, Stair training, Vestibular training, Canalith repositioning, Aquatic Therapy, Dry Needling, Electrical stimulation, Spinal manipulation, Spinal mobilization, Cryotherapy, Moist heat, Taping, Traction, Ultrasound,  Ionotophoresis 4mg /ml Dexamethasone, Manual therapy, and Re-evaluation  PLAN FOR NEXT SESSION: Strengthening, balance, flexibility, focus on proximal hip and core strength, aquatics    Reather Laurence, PT, DPT 06/11/23, 12:24 PM  [facility] [address], [address] [place], [place] [address] Phone # [phone] Fax [phone]

## 2023-06-14 ENCOUNTER — Encounter: Payer: Self-pay | Admitting: Physical Therapy

## 2023-06-14 ENCOUNTER — Ambulatory Visit: Payer: Medicare HMO | Admitting: Physical Therapy

## 2023-06-14 DIAGNOSIS — R262 Difficulty in walking, not elsewhere classified: Secondary | ICD-10-CM | POA: Diagnosis not present

## 2023-06-14 DIAGNOSIS — R2689 Other abnormalities of gait and mobility: Secondary | ICD-10-CM

## 2023-06-14 DIAGNOSIS — M6281 Muscle weakness (generalized): Secondary | ICD-10-CM | POA: Diagnosis not present

## 2023-06-14 DIAGNOSIS — R293 Abnormal posture: Secondary | ICD-10-CM | POA: Diagnosis not present

## 2023-06-14 DIAGNOSIS — R2681 Unsteadiness on feet: Secondary | ICD-10-CM

## 2023-06-14 NOTE — Therapy (Signed)
OUTPATIENT PHYSICAL THERAPY TREATMENT NOTE  Patient Name: Brandon [name] MRN: [id number] DOB:Dec 03, 1945, 78 y.o., male Today's Date: 06/14/2023       END OF SESSION:  PT End of Session - 06/14/23 1245     Visit Number 33    Date for PT Re-Evaluation 06/28/23    Authorization Type [employer]    Authorization Time Period 04/22/2023 - 06/28/2023    Authorization - Visit Number 14    Authorization - Number of Visits 20    Progress Note Due on Visit 42    PT Start Time 1245    PT Stop Time 1335    PT Time Calculation (min) 50 min    Activity Tolerance Patient tolerated treatment well    Behavior During Therapy WFL for tasks assessed/performed                       Past Medical History:  Diagnosis Date   Allergy    Basal cell carcinoma    GERD (gastroesophageal reflux disease)    History of kidney cancer    benign; told not cancerous   Hypertension    Past Surgical History:  Procedure Laterality Date   CATARACT EXTRACTION Bilateral    CRANIOTOMY Bilateral 12/25/2021   Procedure: CRANIOTOMY HEMATOMA EVACUATION SUBDURAL;  Surgeon: Julio Sicks, MD;  Location: [facility] OR;  Service: Neurosurgery;  Laterality: Bilateral;   HERNIA REPAIR     x2   KIDNEY SURGERY Right    Had right kidney removed.    TONSILLECTOMY     VASECTOMY     Patient Active Problem List   Diagnosis Date Noted   Cognitive and neurobehavioral dysfunction following brain injury (HCC) 02/07/2022   Gait difficulty 02/07/2022   Traumatic brain injury with loss of consciousness (HCC) 12/30/2021   SDH (subdural hematoma) (HCC) 12/24/2021   Hyperlipidemia 02/13/2018   BPH (benign prostatic hyperplasia) 01/31/2017   Heart murmur 01/20/2016   HTN (hypertension) 03/09/2012    PCP: Shirline Frees, NP  REFERRING PROVIDER: Shirline Frees, NP  REFERRING DIAG: R26.81 (ICD-10-CM) - Gait instability  THERAPY DIAG:  Muscle weakness (generalized)  Balance problem  Unsteadiness on feet  Abnormal  posture  Difficulty in walking, not elsewhere classified  Rationale for Evaluation and Treatment: Rehabilitation  ONSET DATE: Pt and wife report that frequent falls have been occurring for the past year  SUBJECTIVE:   SUBJECTIVE STATEMENT:Despite not sleeping well last night I actually feel pretty good today.   Pt continues to deny any new falls.    PERTINENT HISTORY: Subdural Hematoma, HTN, GERD, Basal Cell Carcinoma, Hx of benign kidney mass, OA  PAIN:  Are you having pain? no: NPRS scale: 0-4/10 Pain location: bilateral knees Pain description: sore Aggravating factors: unknown Relieving factors: Voltaren gel  PRECAUTIONS: Fall  RED FLAGS: None   WEIGHT BEARING RESTRICTIONS: No  FALLS:  Has patient fallen in last 6 months? Yes. Number of falls 3  LIVING ENVIRONMENT: Lives with: lives with their spouse Lives in: House/apartment Stairs:  one level home, one step to enter porch Has following equipment at home: Single point cane, Environmental consultant - 2 wheeled, Environmental consultant - 4 wheeled, shower chair, and Grab bars  OCCUPATION: retired  PLOF: Requires assistive device for independence and Leisure: walking with wife, going out to eat, movies  PATIENT GOALS: To be able to walk into wedding venue for granddaughter's wedding on September 21st, 2024.  NEXT MD VISIT: Shirline Frees on 07/01/2022  OBJECTIVE:   DIAGNOSTIC FINDINGS:  Head CT on 04/27/2023: IMPRESSION: 1. Stable 6 mm acute on chronic left subdural hematoma. No associated midline shift. 2. Stable 6 mm chronic right subdural hematoma. 3. No new intracranial abnormality. 4. No acute displaced fracture or traumatic listhesis of the cervical spine. 5. Persistent right apical subpleural 1.5 cm ground-glass nodular-like airspace opacity (increased in size from 2018). Adenocarcinoma is not excluded. Additional imaging evaluation or consultation with Pulmonology or Thoracic Surgery recommended.  PATIENT SURVEYS:  Eval:  LEFS 20  / 80 = 25.0 % 03/05/2023:  Lower Extremity Functional Score: 35 / 80 = 43.8 % 04/29/2023:  Lower Extremity Functional Score: 59 / 80 = 73.8 %  COGNITION: Overall cognitive status:  wife reports occasionally pt has word finding problems and some memory impairments      SENSATION: Wife reports that sometimes left hand has "spasms"  MUSCLE LENGTH: Hamstrings: Tightness bilaterally  POSTURE: rounded shoulders, forward head, and flexed trunk    LOWER EXTREMITY ROM:  WFL  LOWER EXTREMITY MMT:  Eval: Right LE strength grossly 4-/5 Left LE strength grossly 4 to 4+/5 throughout  FUNCTIONAL TESTS:  Eval:   5 times sit to stand: 14.96 sec with UE use required Timed up and go (TUG): 13.71 sec without UE device with unsteadiness noted especially with 180 degree turn BERG:  27/56  01/24/2023: 3 minute walk:  378 ft with SPC (pt with shuffling gait pattern)  02/26/2023: Timed up and go (TUG): 11.69 sec 5 times sit to stand: 13.28 sec with UE use required 3 minute walk: 470 ft without assistive device  03/05/2023: BERG: 39/56  04/02/2023: 3 minute walk: 454 ft  04/09/2023: Timed up and go (TUG): 10.16 sec 5 times sit to stand: 15.81 sec first time, 13.94 sec second time 3 minute walk: 499 ft  04/22/2023: 6 minute walk:  955 ft  04/29/2023: 5 times sit to stand: 18.61 sec first test, 16.99 sec second time BERG: 41/56 6 minute walk: 980 ft  06/04/2023: 6 minute walk:  980 ft  06/11/2023: 5 times sit to stand: 12.07 sec 6 minute walk:  1160 ft  GAIT: Distance walked: >200 ft Assistive device utilized: Single point cane Level of assistance: SBA Comments: Pt with unsteady gait noted and requires SBA   TODAY'S TREATMENT:    06/13/22:Pt arrives for aquatic physical therapy. Treatment took place in 3.5-5.5 feet of water. Water temperature was 92 degrees F. Pt entered the pool via stairs step to step with heavy use of the rails. Pt requires buoyancy of water for support  and to offload joints with strengthening exercises.   Seated water bench with 75% submersion Pt performed seated LE AROM exercises with concurrent discussion of current status and pain assessment. 75% depth water walking with UE floats for push/pull/ 10x each direction. Hip 3 ways 20x Bil with UE assit on side of pool  No UE support for heel raises 10x2. High knee forward marching 6in place no UE for support 2x10.  Tandem walking (slightly wider than heel-toe to increase success) 4 lengths holding hand floats. Static stance with small step forward and UE horizontal adb/add with green water bells 2x10 Bil then forward and back 2x10. RTLE step ups 2x15, short seated rest break d/t LE fatigue. Steps up RT 2x10 both holding rails and not holding rails. Alt toe taps to second step 2x15 no UE support.      DATE: 06/11/2023 Nustep level 5 x6 min with PT present to discuss status  5 times sit to stand  6 minute walk:  1160 ft Seated LAQ, marching, and hip ER with 6# on ankles 2x10 each bilat Sit to/from stand on chair with foam pad to elevate surface 2x6 without UE support Standing with 6# on bilat ankles: hip abduction, hip extension, high marching.  2x10 each bilat Leg Press (seat at 9) 125# 2x10 Retrogait with 15# cable pulley x10 Alt LE toe tap to 2nd step without UE support 2x10 Standing hamstring stretch at stairs 2x20 sec bilat   06/07/23: Pt arrives for aquatic physical therapy. Treatment took place in 3.5-5.5 feet of water. Water temperature was 92 degrees F. Pt entered the pool via stairs step to step with heavy use of the rails. Pt requires buoyancy of water for support and to offload joints with strengthening exercises.   Seated water bench with 75% submersion Pt performed seated LE AROM exercises with concurrent discussion of current status and pain assessment. 75% depth water walking with UE floats for push/pull/ 10x each direction, some LOB but pt could self correct. Hip 3 ways 20x Bil  with UE assit on side of pool  No UE support for heel raises 10x2. High knee forward marching 6 lengths of pool holding on the yellow noodle. Tandem walking (slightly wider than heel-toe to increase success) 4 lengths holding hand floats. Static stance with small step forward and UE horizontal adb/add with green water bells 2x10 Bil then forward and back 2x10. RTLE step ups 2x15, short seated rest break d/t LE fatigue. Steps up RT 2x10 both holding rails and not holding rails. Alt toe taps to second step 2x15 no UE support.       PATIENT EDUCATION:  Education details: aquatic exercise technique and rationale Person educated: Patient and Spouse Education method: Explanation, Facilities manager,  Education comprehension: verbalized understanding  HOME EXERCISE PROGRAM: Access Code: [email or web address] URL: [email or web address] Date: 01/10/2023 Prepared by: Clydie Braun Menke  Exercises - Sit to Stand  - 1 x daily - 7 x weekly - 2 sets - 10 reps - Standing Hip Abduction with Counter Support  - 1 x daily - 7 x weekly - 2 sets - 10 reps - Standing Hip Extension with Counter Support  - 1 x daily - 7 x weekly - 2 sets - 10 reps - Standing March with Counter Support  - 1 x daily - 7 x weekly - 2 sets - 10 reps - Heel Raises with Counter Support  - 1 x daily - 7 x weekly - 2 sets - 10 reps  ASSESSMENT:  CLINICAL IMPRESSION: Wife was included in todays session on the pool deck so she could see what pt was doing as we also discussed what they were thinking in terms of pt's independent aquatic exercises which is approaching. They are deciding whether to join [facility] or go to Aquatic center. Pt was much improved with his overall steadiness today compared to last session. He has mastered unassisted heel lifts and marching at this point.  OBJECTIVE IMPAIRMENTS: decreased balance, difficulty walking, decreased strength, impaired flexibility, postural dysfunction, and pain.   ACTIVITY LIMITATIONS: carrying,  lifting, bending, standing, squatting, and stairs  PARTICIPATION LIMITATIONS: cleaning and community activity  PERSONAL FACTORS: Past/current experiences, Time since onset of injury/illness/exacerbation, and 3+ comorbidities: subdural hematoma, HTN, OA  are also affecting patient's functional outcome.   REHAB POTENTIAL: Good  CLINICAL DECISION MAKING: Evolving/moderate complexity  EVALUATION COMPLEXITY: Moderate   GOALS: Goals reviewed with patient? Yes  SHORT TERM GOALS: Target date: 02/01/2023 Pt will be independent with initial  HEP. Baseline: Goal status: MET  2.  Pt will improve ambulation safety with least restrictive assistive device to allow him to safely ambulate distances required for granddaughter's wedding on 02/02/2023. Baseline:  Goal status: Met   LONG TERM GOALS: Target date: 06/28/2023   Pt will be independent with advanced HEP to allow for self progression post discharge. Baseline:  Goal status: Ongoing  2.  Patient will increase Lower Extremity Functional Scale to at least 50% to demonstrate improvement in functional tasks. Baseline: 25% Goal status: MET on 04/29/2023  3.  Patient will increase BERG to at least 42/56 to demonstrate decreased risk of falling. Baseline: 27/56 Goal status: Ongoing (see above)  4.  Pt will report being able to ambulate at least 20 minutes with LRAD without increased pain and no loss of balance to allow for community ambulation. Baseline:  Goal status: Ongoing  5.  Pt will increase LE strength to at least 4+/5 to allow him to perform sit to stand without UE use and navigate a curb with LRAD safely. Baseline:  Goal status: Ongoing   PLAN:  PT FREQUENCY: 2x/week  PT DURATION: 8 weeks  PLANNED INTERVENTIONS: Therapeutic exercises, Therapeutic activity, Neuromuscular re-education, Balance training, Gait training, Patient/Family education, Self Care, Joint mobilization, Joint manipulation, Stair training, Vestibular  training, Canalith repositioning, Aquatic Therapy, Dry Needling, Electrical stimulation, Spinal manipulation, Spinal mobilization, Cryotherapy, Moist heat, Taping, Traction, Ultrasound, Ionotophoresis 4mg /ml Dexamethasone, Manual therapy, and Re-evaluation  PLAN FOR NEXT SESSION: Strengthening, balance, flexibility, focus on proximal hip and core strength, aquatics    Ane Payment, PTA 06/14/23 3:35 PM   [facility] [address], [address] [place], [place] [address] Phone # [phone] Fax [phone]

## 2023-06-18 ENCOUNTER — Ambulatory Visit: Payer: Medicare HMO | Attending: Adult Health | Admitting: Rehabilitative and Restorative Service Providers"

## 2023-06-18 ENCOUNTER — Encounter: Payer: Self-pay | Admitting: Rehabilitative and Restorative Service Providers"

## 2023-06-18 DIAGNOSIS — R293 Abnormal posture: Secondary | ICD-10-CM | POA: Insufficient documentation

## 2023-06-18 DIAGNOSIS — R2681 Unsteadiness on feet: Secondary | ICD-10-CM | POA: Diagnosis not present

## 2023-06-18 DIAGNOSIS — R262 Difficulty in walking, not elsewhere classified: Secondary | ICD-10-CM | POA: Diagnosis not present

## 2023-06-18 DIAGNOSIS — R2689 Other abnormalities of gait and mobility: Secondary | ICD-10-CM | POA: Insufficient documentation

## 2023-06-18 DIAGNOSIS — M6281 Muscle weakness (generalized): Secondary | ICD-10-CM | POA: Insufficient documentation

## 2023-06-18 NOTE — Therapy (Signed)
 OUTPATIENT PHYSICAL THERAPY TREATMENT NOTE  Patient Name: Brandon [name] MRN: [id number] DOB:September 09, 1945, 78 y.o., male Today's Date: 06/18/2023       END OF SESSION:  PT End of Session - 06/18/23 1152     Visit Number 34    Date for PT Re-Evaluation 06/28/23    Authorization Type [employer]    Authorization Time Period 04/22/2023 - 06/28/2023    Authorization - Visit Number 15    Authorization - Number of Visits 20    Progress Note Due on Visit 42    PT Start Time 1143    PT Stop Time 1225    PT Time Calculation (min) 42 min    Activity Tolerance Patient tolerated treatment well    Behavior During Therapy WFL for tasks assessed/performed                       Past Medical History:  Diagnosis Date   Allergy    Basal cell carcinoma    GERD (gastroesophageal reflux disease)    History of kidney cancer    benign; told not cancerous   Hypertension    Past Surgical History:  Procedure Laterality Date   CATARACT EXTRACTION Bilateral    CRANIOTOMY Bilateral 12/25/2021   Procedure: CRANIOTOMY HEMATOMA EVACUATION SUBDURAL;  Surgeon: Louis Shove, MD;  Location: [facility] OR;  Service: Neurosurgery;  Laterality: Bilateral;   HERNIA REPAIR     x2   KIDNEY SURGERY Right    Had right kidney removed.    TONSILLECTOMY     VASECTOMY     Patient Active Problem List   Diagnosis Date Noted   Cognitive and neurobehavioral dysfunction following brain injury (HCC) 02/07/2022   Gait difficulty 02/07/2022   Traumatic brain injury with loss of consciousness (HCC) 12/30/2021   SDH (subdural hematoma) (HCC) 12/24/2021   Hyperlipidemia 02/13/2018   BPH (benign prostatic hyperplasia) 01/31/2017   Heart murmur 01/20/2016   HTN (hypertension) 03/09/2012    PCP: Merna Huxley, NP  REFERRING PROVIDER: Merna Huxley, NP  REFERRING DIAG: R26.81 (ICD-10-CM) - Gait instability  THERAPY DIAG:  Muscle weakness (generalized)  Balance problem  Unsteadiness on feet  Abnormal  posture  Difficulty in walking, not elsewhere classified  Rationale for Evaluation and Treatment: Rehabilitation  ONSET DATE: Pt and wife report that frequent falls have been occurring for the past year  SUBJECTIVE:   SUBJECTIVE STATEMENT:Despite not sleeping well last night I actually feel pretty good today.   Pt continues to deny any new falls.    PERTINENT HISTORY: Subdural Hematoma, HTN, GERD, Basal Cell Carcinoma, Hx of benign kidney mass, OA  PAIN:  Are you having pain? no: NPRS scale: 0-4/10 Pain location: bilateral knees Pain description: sore Aggravating factors: unknown Relieving factors: Voltaren  gel  PRECAUTIONS: Fall  RED FLAGS: None   WEIGHT BEARING RESTRICTIONS: No  FALLS:  Has patient fallen in last 6 months? Yes. Number of falls 3  LIVING ENVIRONMENT: Lives with: lives with their spouse Lives in: House/apartment Stairs:  one level home, one step to enter porch Has following equipment at home: Single point cane, Environmental Consultant - 2 wheeled, Environmental Consultant - 4 wheeled, shower chair, and Grab bars  OCCUPATION: retired  PLOF: Requires assistive device for independence and Leisure: walking with wife, going out to eat, movies  PATIENT GOALS: To be able to walk into wedding venue for granddaughter's wedding on September 21st, 2024.  NEXT MD VISIT: Huxley Merna on 07/01/2022  OBJECTIVE:   DIAGNOSTIC FINDINGS:  Head CT on 04/27/2023: IMPRESSION: 1. Stable 6 mm acute on chronic left subdural hematoma. No associated midline shift. 2. Stable 6 mm chronic right subdural hematoma. 3. No new intracranial abnormality. 4. No acute displaced fracture or traumatic listhesis of the cervical spine. 5. Persistent right apical subpleural 1.5 cm ground-glass nodular-like airspace opacity (increased in size from 2018). Adenocarcinoma is not excluded. Additional imaging evaluation or consultation with Pulmonology or Thoracic Surgery recommended.  PATIENT SURVEYS:  Eval:  LEFS 20  / 80 = 25.0 % 03/05/2023:  Lower Extremity Functional Score: 35 / 80 = 43.8 % 04/29/2023:  Lower Extremity Functional Score: 59 / 80 = 73.8 %  COGNITION: Overall cognitive status:  wife reports occasionally pt has word finding problems and some memory impairments      SENSATION: Wife reports that sometimes left hand has spasms  MUSCLE LENGTH: Hamstrings: Tightness bilaterally  POSTURE: rounded shoulders, forward head, and flexed trunk    LOWER EXTREMITY ROM:  WFL  LOWER EXTREMITY MMT:  Eval: Right LE strength grossly 4-/5 Left LE strength grossly 4 to 4+/5 throughout  FUNCTIONAL TESTS:  Eval:   5 times sit to stand: 14.96 sec with UE use required Timed up and go (TUG): 13.71 sec without UE device with unsteadiness noted especially with 180 degree turn BERG:  27/56  01/24/2023: 3 minute walk:  378 ft with SPC (pt with shuffling gait pattern)  02/26/2023: Timed up and go (TUG): 11.69 sec 5 times sit to stand: 13.28 sec with UE use required 3 minute walk: 470 ft without assistive device  03/05/2023: BERG: 39/56  04/02/2023: 3 minute walk: 454 ft  04/09/2023: Timed up and go (TUG): 10.16 sec 5 times sit to stand: 15.81 sec first time, 13.94 sec second time 3 minute walk: 499 ft  04/22/2023: 6 minute walk:  955 ft  04/29/2023: 5 times sit to stand: 18.61 sec first test, 16.99 sec second time BERG: 41/56 6 minute walk: 980 ft  06/04/2023: 6 minute walk:  980 ft  06/11/2023: 5 times sit to stand: 12.07 sec 6 minute walk:  1160 ft  GAIT: Distance walked: >200 ft Assistive device utilized: Single point cane Level of assistance: SBA Comments: Pt with unsteady gait noted and requires SBA   TODAY'S TREATMENT:    DATE: 06/18/2023 Nustep level 5 x6 min with PT present to discuss status  Sit to/from stand on chair with foam pad to elevate surface 2x6 without UE support Seated LAQ, marching, and hip ER with 6# on ankles 2x10 each bilat Standing with 6# on  bilat ankles: hip abduction, hip extension, high marching.  2x10 each bilat Ambulation down to cancer gym and back with 6# x2 with seated recovery period in between Leg Press (seat at 9) 130# 2x10 Alt LE toe tap to 2nd step without UE support 2x10 Standing hamstring stretch at stairs 2x20 sec bilat Ambulation outside on sidewalks in front of clinic to assess functional balance and ability to ambulate over various surfaces in the community   06/13/22:Pt arrives for aquatic physical therapy. Treatment took place in 3.5-5.5 feet of water. Water temperature was 92 degrees F. Pt entered the pool via stairs step to step with heavy use of the rails. Pt requires buoyancy of water for support and to offload joints with strengthening exercises.   Seated water bench with 75% submersion Pt performed seated LE AROM exercises with concurrent discussion of current status and pain assessment. 75% depth water walking with UE floats for push/pull/ 10x each  direction. Hip 3 ways 20x Bil with UE assit on side of pool  No UE support for heel raises 10x2. High knee forward marching 6in place no UE for support 2x10.  Tandem walking (slightly wider than heel-toe to increase success) 4 lengths holding hand floats. Static stance with small step forward and UE horizontal adb/add with green water bells 2x10 Bil then forward and back 2x10. RTLE step ups 2x15, short seated rest break d/t LE fatigue. Steps up RT 2x10 both holding rails and not holding rails. Alt toe taps to second step 2x15 no UE support.      DATE: 06/11/2023 Nustep level 5 x6 min with PT present to discuss status  5 times sit to stand 6 minute walk:  1160 ft Seated LAQ, marching, and hip ER with 6# on ankles 2x10 each bilat Sit to/from stand on chair with foam pad to elevate surface 2x6 without UE support Standing with 6# on bilat ankles: hip abduction, hip extension, high marching.  2x10 each bilat Leg Press (seat at 9) 125# 2x10 Retrogait with 15# cable  pulley x10 Alt LE toe tap to 2nd step without UE support 2x10 Standing hamstring stretch at stairs 2x20 sec bilat    PATIENT EDUCATION:  Education details: aquatic exercise technique and rationale Person educated: Patient and Spouse Education method: Programmer, Multimedia, Facilities Manager,  Education comprehension: verbalized understanding  HOME EXERCISE PROGRAM: Access Code: [email or web address] URL: [email or web address] Date: 01/10/2023 Prepared by: Jarrell Kristianna Saperstein  Exercises - Sit to Stand  - 1 x daily - 7 x weekly - 2 sets - 10 reps - Standing Hip Abduction with Counter Support  - 1 x daily - 7 x weekly - 2 sets - 10 reps - Standing Hip Extension with Counter Support  - 1 x daily - 7 x weekly - 2 sets - 10 reps - Standing March with Counter Support  - 1 x daily - 7 x weekly - 2 sets - 10 reps - Heel Raises with Counter Support  - 1 x daily - 7 x weekly - 2 sets - 10 reps  ASSESSMENT:  CLINICAL IMPRESSION:  Brandon [name] is progressing well with goal related activities and is on track for discharge at end of current certification next week.  Patient able to ambulate outside over unlevel surface without a loss of balance and did not have any shuffling feet.  Patient continues with good participation throughout session.  Patient continues to require skilled PT to progress towards goal related activities.  OBJECTIVE IMPAIRMENTS: decreased balance, difficulty walking, decreased strength, impaired flexibility, postural dysfunction, and pain.   ACTIVITY LIMITATIONS: carrying, lifting, bending, standing, squatting, and stairs  PARTICIPATION LIMITATIONS: cleaning and community activity  PERSONAL FACTORS: Past/current experiences, Time since onset of injury/illness/exacerbation, and 3+ comorbidities: subdural hematoma, HTN, OA  are also affecting patient's functional outcome.   REHAB POTENTIAL: Good  CLINICAL DECISION MAKING: Evolving/moderate complexity  EVALUATION COMPLEXITY:  Moderate   GOALS: Goals reviewed with patient? Yes  SHORT TERM GOALS: Target date: 02/01/2023 Pt will be independent with initial HEP. Baseline: Goal status: MET  2.  Pt will improve ambulation safety with least restrictive assistive device to allow him to safely ambulate distances required for granddaughter's wedding on 02/02/2023. Baseline:  Goal status: Met   LONG TERM GOALS: Target date: 06/28/2023   Pt will be independent with advanced HEP to allow for self progression post discharge. Baseline:  Goal status: Met on 06/18/23  2.  Patient will increase Lower Extremity Functional Scale to at least  50% to demonstrate improvement in functional tasks. Baseline: 25% Goal status: MET on 04/29/2023  3.  Patient will increase BERG to at least 42/56 to demonstrate decreased risk of falling. Baseline: 27/56 Goal status: Ongoing (see above)  4.  Pt will report being able to ambulate at least 20 minutes with LRAD without increased pain and no loss of balance to allow for community ambulation. Baseline:  Goal status: Ongoing  5.  Pt will increase LE strength to at least 4+/5 to allow him to perform sit to stand without UE use and navigate a curb with LRAD safely. Baseline:  Goal status: Ongoing   PLAN:  PT FREQUENCY: 2x/week  PT DURATION: 8 weeks  PLANNED INTERVENTIONS: Therapeutic exercises, Therapeutic activity, Neuromuscular re-education, Balance training, Gait training, Patient/Family education, Self Care, Joint mobilization, Joint manipulation, Stair training, Vestibular training, Canalith repositioning, Aquatic Therapy, Dry Needling, Electrical stimulation, Spinal manipulation, Spinal mobilization, Cryotherapy, Moist heat, Taping, Traction, Ultrasound, Ionotophoresis 4mg /ml Dexamethasone , Manual therapy, and Re-evaluation  PLAN FOR NEXT SESSION: Strengthening, balance, flexibility, focus on proximal hip and core strength, aquatics.  Discharge next week.    Jarrell Laming, PT,  DPT 06/18/23, 12:30 PM  [facility] [address], [address] [place], [place] [address] Phone # [phone] Fax [phone]

## 2023-06-21 ENCOUNTER — Ambulatory Visit: Payer: Medicare HMO | Admitting: Physical Therapy

## 2023-06-21 ENCOUNTER — Encounter: Payer: Self-pay | Admitting: Physical Therapy

## 2023-06-21 DIAGNOSIS — R2681 Unsteadiness on feet: Secondary | ICD-10-CM | POA: Diagnosis not present

## 2023-06-21 DIAGNOSIS — R2689 Other abnormalities of gait and mobility: Secondary | ICD-10-CM

## 2023-06-21 DIAGNOSIS — R262 Difficulty in walking, not elsewhere classified: Secondary | ICD-10-CM | POA: Diagnosis not present

## 2023-06-21 DIAGNOSIS — M6281 Muscle weakness (generalized): Secondary | ICD-10-CM | POA: Diagnosis not present

## 2023-06-21 DIAGNOSIS — R293 Abnormal posture: Secondary | ICD-10-CM | POA: Diagnosis not present

## 2023-06-21 NOTE — Therapy (Signed)
 OUTPATIENT PHYSICAL THERAPY TREATMENT NOTE  Patient Name: Brandon [name] MRN: [id number] DOB:03-24-1946, 78 y.o., male Today's Date: 06/21/2023       END OF SESSION:  PT End of Session - 06/21/23 1220     Visit Number 35    Date for PT Re-Evaluation 06/28/23    Authorization Type [employer]    Authorization Time Period 04/22/2023 - 06/28/2023    Authorization - Visit Number 16    Authorization - Number of Visits 20    Progress Note Due on Visit 42    PT Start Time 1220    PT Stop Time 1300    PT Time Calculation (min) 40 min    Activity Tolerance Patient tolerated treatment well    Behavior During Therapy WFL for tasks assessed/performed                        Past Medical History:  Diagnosis Date   Allergy    Basal cell carcinoma    GERD (gastroesophageal reflux disease)    History of kidney cancer    benign; told not cancerous   Hypertension    Past Surgical History:  Procedure Laterality Date   CATARACT EXTRACTION Bilateral    CRANIOTOMY Bilateral 12/25/2021   Procedure: CRANIOTOMY HEMATOMA EVACUATION SUBDURAL;  Surgeon: Louis Shove, MD;  Location: [facility] OR;  Service: Neurosurgery;  Laterality: Bilateral;   HERNIA REPAIR     x2   KIDNEY SURGERY Right    Had right kidney removed.    TONSILLECTOMY     VASECTOMY     Patient Active Problem List   Diagnosis Date Noted   Cognitive and neurobehavioral dysfunction following brain injury (HCC) 02/07/2022   Gait difficulty 02/07/2022   Traumatic brain injury with loss of consciousness (HCC) 12/30/2021   SDH (subdural hematoma) (HCC) 12/24/2021   Hyperlipidemia 02/13/2018   BPH (benign prostatic hyperplasia) 01/31/2017   Heart murmur 01/20/2016   HTN (hypertension) 03/09/2012    PCP: Merna Huxley, NP  REFERRING PROVIDER: Merna Huxley, NP  REFERRING DIAG: R26.81 (ICD-10-CM) - Gait instability  THERAPY DIAG:  Muscle weakness (generalized)  Balance problem  Unsteadiness on  feet  Difficulty in walking, not elsewhere classified  Rationale for Evaluation and Treatment: Rehabilitation  ONSET DATE: Pt and wife report that frequent falls have been occurring for the past year  SUBJECTIVE:   SUBJECTIVE STATEMENT: Had a good week.   Pt continues to deny any new falls.    PERTINENT HISTORY: Subdural Hematoma, HTN, GERD, Basal Cell Carcinoma, Hx of benign kidney mass, OA  PAIN:  Are you having pain? no: NPRS scale: 0-4/10 Pain location: bilateral knees Pain description: sore Aggravating factors: unknown Relieving factors: Voltaren  gel  PRECAUTIONS: Fall  RED FLAGS: None   WEIGHT BEARING RESTRICTIONS: No  FALLS:  Has patient fallen in last 6 months? Yes. Number of falls 3  LIVING ENVIRONMENT: Lives with: lives with their spouse Lives in: House/apartment Stairs:  one level home, one step to enter porch Has following equipment at home: Single point cane, Environmental Consultant - 2 wheeled, Environmental Consultant - 4 wheeled, shower chair, and Grab bars  OCCUPATION: retired  PLOF: Requires assistive device for independence and Leisure: walking with wife, going out to eat, movies  PATIENT GOALS: To be able to walk into wedding venue for granddaughter's wedding on September 21st, 2024.  NEXT MD VISIT: Huxley Merna on 07/01/2022  OBJECTIVE:   DIAGNOSTIC FINDINGS: Head CT on 04/27/2023: IMPRESSION: 1. Stable 6 mm  acute on chronic left subdural hematoma. No associated midline shift. 2. Stable 6 mm chronic right subdural hematoma. 3. No new intracranial abnormality. 4. No acute displaced fracture or traumatic listhesis of the cervical spine. 5. Persistent right apical subpleural 1.5 cm ground-glass nodular-like airspace opacity (increased in size from 2018). Adenocarcinoma is not excluded. Additional imaging evaluation or consultation with Pulmonology or Thoracic Surgery recommended.  PATIENT SURVEYS:  Eval:  LEFS 20 / 80 = 25.0 % 03/05/2023:  Lower Extremity Functional  Score: 35 / 80 = 43.8 % 04/29/2023:  Lower Extremity Functional Score: 59 / 80 = 73.8 %  COGNITION: Overall cognitive status:  wife reports occasionally pt has word finding problems and some memory impairments      SENSATION: Wife reports that sometimes left hand has spasms  MUSCLE LENGTH: Hamstrings: Tightness bilaterally  POSTURE: rounded shoulders, forward head, and flexed trunk    LOWER EXTREMITY ROM:  WFL  LOWER EXTREMITY MMT:  Eval: Right LE strength grossly 4-/5 Left LE strength grossly 4 to 4+/5 throughout  FUNCTIONAL TESTS:  Eval:   5 times sit to stand: 14.96 sec with UE use required Timed up and go (TUG): 13.71 sec without UE device with unsteadiness noted especially with 180 degree turn BERG:  27/56  01/24/2023: 3 minute walk:  378 ft with SPC (pt with shuffling gait pattern)  02/26/2023: Timed up and go (TUG): 11.69 sec 5 times sit to stand: 13.28 sec with UE use required 3 minute walk: 470 ft without assistive device  03/05/2023: BERG: 39/56  04/02/2023: 3 minute walk: 454 ft  04/09/2023: Timed up and go (TUG): 10.16 sec 5 times sit to stand: 15.81 sec first time, 13.94 sec second time 3 minute walk: 499 ft  04/22/2023: 6 minute walk:  955 ft  04/29/2023: 5 times sit to stand: 18.61 sec first test, 16.99 sec second time BERG: 41/56 6 minute walk: 980 ft  06/04/2023: 6 minute walk:  980 ft  06/11/2023: 5 times sit to stand: 12.07 sec 6 minute walk:  1160 ft  GAIT: Distance walked: >200 ft Assistive device utilized: Single point cane Level of assistance: SBA Comments: Pt with unsteady gait noted and requires SBA   TODAY'S TREATMENT:    06/21/23:Pt arrives for aquatic physical therapy. Treatment took place in 3.5-5.5 feet of water. Water temperature was 91 degrees F. Pt entered the pool via stairs step to step with heavy use of the rails. Pt requires buoyancy of water for support and to offload joints with strengthening exercises.    Seated water bench with 75% submersion Pt performed seated LE AROM exercises with concurrent discussion of current status and pain assessment. 75% depth water walking with UE floats for push/pull/ 10x each direction. Hip 3 ways 20x Bil with UE assit on side of pool  No UE support for heel raises 10x2. High knee forward marching 6in place no UE for support 2x10.  Tandem walking (slightly wider than heel-toe to increase success) 4 lengths holding hand floats. Static stance with small step forward and UE horizontal adb/add with green water bells 2x10 Bil then forward and back 2x10. RTLE step ups 2x15, short seated rest break d/t LE fatigue. Steps up RT 2x10 both holding rails and not holding rails. Alt toe taps to second step 2x15 no UE support. Marching 10x, then tapping opposite knee with hand.   DATE: 06/18/2023 Nustep level 5 x6 min with PT present to discuss status  Sit to/from stand on chair with foam pad to  elevate surface 2x6 without UE support Seated LAQ, marching, and hip ER with 6# on ankles 2x10 each bilat Standing with 6# on bilat ankles: hip abduction, hip extension, high marching.  2x10 each bilat Ambulation down to cancer gym and back with 6# x2 with seated recovery period in between Leg Press (seat at 9) 130# 2x10 Alt LE toe tap to 2nd step without UE support 2x10 Standing hamstring stretch at stairs 2x20 sec bilat Ambulation outside on sidewalks in front of clinic to assess functional balance and ability to ambulate over various surfaces in the community   06/13/22:Pt arrives for aquatic physical therapy. Treatment took place in 3.5-5.5 feet of water. Water temperature was 92 degrees F. Pt entered the pool via stairs step to step with heavy use of the rails. Pt requires buoyancy of water for support and to offload joints with strengthening exercises.   Seated water bench with 75% submersion Pt performed seated LE AROM exercises with concurrent discussion of current status and pain  assessment. 75% depth water walking with UE floats for push/pull/ 10x each direction. Hip 3 ways 20x Bil with UE assit on side of pool  No UE support for heel raises 10x2. High knee forward marching 6in place no UE for support 2x10.  Tandem walking (slightly wider than heel-toe to increase success) 4 lengths holding hand floats. Static stance with small step forward and UE horizontal adb/add with green water bells 2x10 Bil then forward and back 2x10. RTLE step ups 2x15, short seated rest break d/t LE fatigue. Steps up RT 2x10 both holding rails and not holding rails. Alt toe taps to second step 2x15 no UE support.      PATIENT EDUCATION:  Education details: aquatic exercise technique and rationale Person educated: Patient and Spouse Education method: Explanation, Facilities Manager,  Education comprehension: verbalized understanding  HOME EXERCISE PROGRAM: Access Code: [email or web address] URL: [email or web address] Date: 01/10/2023 Prepared by: Jarrell Menke  Exercises - Sit to Stand  - 1 x daily - 7 x weekly - 2 sets - 10 reps - Standing Hip Abduction with Counter Support  - 1 x daily - 7 x weekly - 2 sets - 10 reps - Standing Hip Extension with Counter Support  - 1 x daily - 7 x weekly - 2 sets - 10 reps - Standing March with Counter Support  - 1 x daily - 7 x weekly - 2 sets - 10 reps - Heel Raises with Counter Support  - 1 x daily - 7 x weekly - 2 sets - 10 reps  ASSESSMENT:  CLINICAL IMPRESSION: Pt and wife in discussion of what they plan to do regarding aquatic exercise post discharge next week. They are deciding between joining [facility] and the [facility]. Difficulty with tandem walking today but pt is safe enough to do independently upon DC next week.  OBJECTIVE IMPAIRMENTS: decreased balance, difficulty walking, decreased strength, impaired flexibility, postural dysfunction, and pain.   ACTIVITY LIMITATIONS: carrying, lifting, bending, standing, squatting, and stairs  PARTICIPATION  LIMITATIONS: cleaning and community activity  PERSONAL FACTORS: Past/current experiences, Time since onset of injury/illness/exacerbation, and 3+ comorbidities: subdural hematoma, HTN, OA  are also affecting patient's functional outcome.   REHAB POTENTIAL: Good  CLINICAL DECISION MAKING: Evolving/moderate complexity  EVALUATION COMPLEXITY: Moderate   GOALS: Goals reviewed with patient? Yes  SHORT TERM GOALS: Target date: 02/01/2023 Pt will be independent with initial HEP. Baseline: Goal status: MET  2.  Pt will improve ambulation safety with least restrictive assistive device to allow him  to safely ambulate distances required for granddaughter's wedding on 02/02/2023. Baseline:  Goal status: Met   LONG TERM GOALS: Target date: 06/28/2023   Pt will be independent with advanced HEP to allow for self progression post discharge. Baseline:  Goal status: Met on 06/18/23  2.  Patient will increase Lower Extremity Functional Scale to at least 50% to demonstrate improvement in functional tasks. Baseline: 25% Goal status: MET on 04/29/2023  3.  Patient will increase BERG to at least 42/56 to demonstrate decreased risk of falling. Baseline: 27/56 Goal status: Ongoing (see above)  4.  Pt will report being able to ambulate at least 20 minutes with LRAD without increased pain and no loss of balance to allow for community ambulation. Baseline:  Goal status: Ongoing  5.  Pt will increase LE strength to at least 4+/5 to allow him to perform sit to stand without UE use and navigate a curb with LRAD safely. Baseline:  Goal status: Ongoing   PLAN:  PT FREQUENCY: 2x/week  PT DURATION: 8 weeks  PLANNED INTERVENTIONS: Therapeutic exercises, Therapeutic activity, Neuromuscular re-education, Balance training, Gait training, Patient/Family education, Self Care, Joint mobilization, Joint manipulation, Stair training, Vestibular training, Canalith repositioning, Aquatic Therapy, Dry Needling,  Electrical stimulation, Spinal manipulation, Spinal mobilization, Cryotherapy, Moist heat, Taping, Traction, Ultrasound, Ionotophoresis 4mg /ml Dexamethasone , Manual therapy, and Re-evaluation  PLAN FOR NEXT SESSION: Strengthening, balance, flexibility, focus on proximal hip and core strength, aquatics.  Discharge next week.    Delon Darner, PTA 06/21/23 2:34 PM   [facility] [address] [place], [place] [address] Phone # [phone] Fax [phone]

## 2023-06-24 ENCOUNTER — Encounter: Payer: Self-pay | Admitting: Rehabilitative and Restorative Service Providers"

## 2023-06-24 ENCOUNTER — Ambulatory Visit: Payer: Medicare HMO | Admitting: Rehabilitative and Restorative Service Providers"

## 2023-06-24 DIAGNOSIS — M6281 Muscle weakness (generalized): Secondary | ICD-10-CM

## 2023-06-24 DIAGNOSIS — R2689 Other abnormalities of gait and mobility: Secondary | ICD-10-CM | POA: Diagnosis not present

## 2023-06-24 DIAGNOSIS — R293 Abnormal posture: Secondary | ICD-10-CM | POA: Diagnosis not present

## 2023-06-24 DIAGNOSIS — R262 Difficulty in walking, not elsewhere classified: Secondary | ICD-10-CM | POA: Diagnosis not present

## 2023-06-24 DIAGNOSIS — R2681 Unsteadiness on feet: Secondary | ICD-10-CM

## 2023-06-24 NOTE — Therapy (Signed)
 OUTPATIENT PHYSICAL THERAPY TREATMENT NOTE  Patient Name: Brandon [name] MRN: [id number] DOB:Sep 09, 1945, 78 y.o., male Today's Date: 06/24/2023    END OF SESSION:  PT End of Session - 06/24/23 1101     Visit Number 36    Date for PT Re-Evaluation 06/28/23    Authorization Type [employer]    Authorization Time Period 04/22/2023 - 06/28/2023    Authorization - Visit Number 17    Authorization - Number of Visits 20    Progress Note Due on Visit 42    PT Start Time 1058    PT Stop Time 1140    PT Time Calculation (min) 42 min    Activity Tolerance Patient tolerated treatment well    Behavior During Therapy WFL for tasks assessed/performed                        Past Medical History:  Diagnosis Date   Allergy    Basal cell carcinoma    GERD (gastroesophageal reflux disease)    History of kidney cancer    benign; told not cancerous   Hypertension    Past Surgical History:  Procedure Laterality Date   CATARACT EXTRACTION Bilateral    CRANIOTOMY Bilateral 12/25/2021   Procedure: CRANIOTOMY HEMATOMA EVACUATION SUBDURAL;  Surgeon: Agustina Aldrich, MD;  Location: [facility] OR;  Service: Neurosurgery;  Laterality: Bilateral;   HERNIA REPAIR     x2   KIDNEY SURGERY Right    Had right kidney removed.    TONSILLECTOMY     VASECTOMY     Patient Active Problem List   Diagnosis Date Noted   Cognitive and neurobehavioral dysfunction following brain injury (HCC) 02/07/2022   Gait difficulty 02/07/2022   Traumatic brain injury with loss of consciousness (HCC) 12/30/2021   SDH (subdural hematoma) (HCC) 12/24/2021   Hyperlipidemia 02/13/2018   BPH (benign prostatic hyperplasia) 01/31/2017   Heart murmur 01/20/2016   HTN (hypertension) 03/09/2012    PCP: Alto Atta, NP  REFERRING PROVIDER: Alto Atta, NP  REFERRING DIAG: R26.81 (ICD-10-CM) - Gait instability  THERAPY DIAG:  Muscle weakness (generalized)  Balance problem  Unsteadiness on feet  Difficulty  in walking, not elsewhere classified  Abnormal posture  Rationale for Evaluation and Treatment: Rehabilitation  ONSET DATE: Pt and wife report that frequent falls have been occurring for the past year  SUBJECTIVE:   SUBJECTIVE STATEMENT: Had a good week.   Pt continues to deny any new falls.    PERTINENT HISTORY: Subdural Hematoma, HTN, GERD, Basal Cell Carcinoma, Hx of benign kidney mass, OA  PAIN:  Are you having pain? no: NPRS scale: 0-4/10 Pain location: bilateral knees Pain description: sore Aggravating factors: unknown Relieving factors: Voltaren  gel  PRECAUTIONS: Fall  RED FLAGS: None   WEIGHT BEARING RESTRICTIONS: No  FALLS:  Has patient fallen in last 6 months? Yes. Number of falls 3  LIVING ENVIRONMENT: Lives with: lives with their spouse Lives in: House/apartment Stairs:  one level home, one step to enter porch Has following equipment at home: Single point cane, Environmental consultant - 2 wheeled, Environmental consultant - 4 wheeled, shower chair, and Grab bars  OCCUPATION: retired  PLOF: Requires assistive device for independence and Leisure: walking with wife, going out to eat, movies  PATIENT GOALS: To be able to walk into wedding venue for granddaughter's wedding on September 21st, 2024.  NEXT MD VISIT: Alto Atta on 07/01/2022  OBJECTIVE:   DIAGNOSTIC FINDINGS: Head CT on 04/27/2023: IMPRESSION: 1. Stable 6 mm  acute on chronic left subdural hematoma. No associated midline shift. 2. Stable 6 mm chronic right subdural hematoma. 3. No new intracranial abnormality. 4. No acute displaced fracture or traumatic listhesis of the cervical spine. 5. Persistent right apical subpleural 1.5 cm ground-glass nodular-like airspace opacity (increased in size from 2018). Adenocarcinoma is not excluded. Additional imaging evaluation or consultation with Pulmonology or Thoracic Surgery recommended.  PATIENT SURVEYS:  Eval:  LEFS 20 / 80 = 25.0 % 03/05/2023:  Lower Extremity Functional  Score: 35 / 80 = 43.8 % 04/29/2023:  Lower Extremity Functional Score: 59 / 80 = 73.8 %  COGNITION: Overall cognitive status:  wife reports occasionally pt has word finding problems and some memory impairments      SENSATION: Wife reports that sometimes left hand has "spasms"  MUSCLE LENGTH: Hamstrings: Tightness bilaterally  POSTURE: rounded shoulders, forward head, and flexed trunk    LOWER EXTREMITY ROM:  WFL  LOWER EXTREMITY MMT:  Eval: Right LE strength grossly 4-/5 Left LE strength grossly 4 to 4+/5 throughout  06/24/2023: Right LE strength of 5-/5 Left LE strength of 4+/5  FUNCTIONAL TESTS:  Eval:   5 times sit to stand: 14.96 sec with UE use required Timed up and go (TUG): 13.71 sec without UE device with unsteadiness noted especially with 180 degree turn BERG:  27/56  01/24/2023: 3 minute walk:  378 ft with SPC (pt with shuffling gait pattern)  02/26/2023: Timed up and go (TUG): 11.69 sec 5 times sit to stand: 13.28 sec with UE use required 3 minute walk: 470 ft without assistive device  03/05/2023: BERG: 39/56  04/02/2023: 3 minute walk: 454 ft  04/09/2023: Timed up and go (TUG): 10.16 sec 5 times sit to stand: 15.81 sec first time, 13.94 sec second time 3 minute walk: 499 ft  04/22/2023: 6 minute walk:  955 ft  04/29/2023: 5 times sit to stand: 18.61 sec first test, 16.99 sec second time BERG: 41/56 6 minute walk: 980 ft  06/04/2023: 6 minute walk:  980 ft  06/11/2023: 5 times sit to stand: 12.07 sec 6 minute walk:  1160 ft  06/24/2023: BERG 47/56  GAIT: Distance walked: >200 ft Assistive device utilized: Single point cane Level of assistance: SBA Comments: Pt with unsteady gait noted and requires SBA   TODAY'S TREATMENT:    DATE: 06/24/2023 Nustep level 5 x6 min with PT present to discuss status BERG 47/56 Sit to/from stand from PT mat x7 (without UE use) Seated LAQ, marching, and hip ER with 6# on ankles 2x10 each  bilat Standing with 6# on bilat ankles: hip abduction, hip extension, high marching.  2x10 each bilat Ambulation down to cancer gym and back with 6# x2 with seated recovery period in between Seated hamstring stretch 2x20 sec bilat   06/21/23:Pt arrives for aquatic physical therapy. Treatment took place in 3.5-5.5 feet of water. Water temperature was 91 degrees F. Pt entered the pool via stairs step to step with heavy use of the rails. Pt requires buoyancy of water for support and to offload joints with strengthening exercises.   Seated water bench with 75% submersion Pt performed seated LE AROM exercises with concurrent discussion of current status and pain assessment. 75% depth water walking with UE floats for push/pull/ 10x each direction. Hip 3 ways 20x Bil with UE assit on side of pool  No UE support for heel raises 10x2. High knee forward marching 6in place no UE for support 2x10.  Tandem walking (slightly wider than heel-toe  to increase success) 4 lengths holding hand floats. Static stance with small step forward and UE horizontal adb/add with green water bells 2x10 Bil then forward and back 2x10. RTLE step ups 2x15, short seated rest break d/t LE fatigue. Steps up RT 2x10 both holding rails and not holding rails. Alt toe taps to second step 2x15 no UE support. Marching 10x, then tapping opposite knee with hand.   DATE: 06/18/2023 Nustep level 5 x6 min with PT present to discuss status  Sit to/from stand on chair with foam pad to elevate surface 2x6 without UE support Seated LAQ, marching, and hip ER with 6# on ankles 2x10 each bilat Standing with 6# on bilat ankles: hip abduction, hip extension, high marching.  2x10 each bilat Ambulation down to cancer gym and back with 6# x2 with seated recovery period in between Leg Press (seat at 9) 130# 2x10 Alt LE toe tap to 2nd step without UE support 2x10 Standing hamstring stretch at stairs 2x20 sec bilat Ambulation outside on sidewalks in front of  clinic to assess functional balance and ability to ambulate over various surfaces in the community    PATIENT EDUCATION:  Education details: aquatic exercise technique and rationale Person educated: Patient and Spouse Education method: Programmer, multimedia, Facilities manager,  Education comprehension: verbalized understanding  HOME EXERCISE PROGRAM: Access Code: [email or web address] URL: [email or web address] Date: 06/24/2023 Prepared by: Chaneta Comer Jolita Haefner  Exercises - Sit to Stand  - 1 x daily - 7 x weekly - 2 sets - 10 reps - Standing Hip Abduction with Counter Support  - 1 x daily - 7 x weekly - 2 sets - 10 reps - Standing Hip Extension with Counter Support  - 1 x daily - 7 x weekly - 2 sets - 10 reps - Standing March with Counter Support  - 1 x daily - 7 x weekly - 2 sets - 10 reps - Heel Raises with Counter Support  - 1 x daily - 7 x weekly - 2 sets - 10 reps - Standing Tandem Balance with Counter Support  - 1 x daily - 7 x weekly - 2 reps - 20 sec hold - Seated Hamstring Stretch  - 1 x daily - 7 x weekly - 2 reps - 20 sec hold  ASSESSMENT:  CLINICAL IMPRESSION:  Mr Paxson presents to skilled PT reporting that he is doing well and has not had any falls.  Patient with improved score noted on BERG and improved LE strength.  Patient reports that he has been able to walk out in the community without any falls.  Patient is looking to join a gym to progress with exercises after discharge later this week.  Educated patient about the importance of continued HEP following discharge and he and his wife verbalize understanding.  Patient to be discharged next aquatics PT session to continue with independent HEP.  OBJECTIVE IMPAIRMENTS: decreased balance, difficulty walking, decreased strength, impaired flexibility, postural dysfunction, and pain.   ACTIVITY LIMITATIONS: carrying, lifting, bending, standing, squatting, and stairs  PARTICIPATION LIMITATIONS: cleaning and community activity  PERSONAL  FACTORS: Past/current experiences, Time since onset of injury/illness/exacerbation, and 3+ comorbidities: subdural hematoma, HTN, OA  are also affecting patient's functional outcome.   REHAB POTENTIAL: Good  CLINICAL DECISION MAKING: Evolving/moderate complexity  EVALUATION COMPLEXITY: Moderate   GOALS: Goals reviewed with patient? Yes  SHORT TERM GOALS: Target date: 02/01/2023 Pt will be independent with initial HEP. Baseline: Goal status: MET  2.  Pt will improve ambulation safety with least restrictive assistive device  to allow him to safely ambulate distances required for granddaughter's wedding on 02/02/2023. Baseline:  Goal status: Met   LONG TERM GOALS: Target date: 06/28/2023   Pt will be independent with advanced HEP to allow for self progression post discharge. Baseline:  Goal status: Met on 06/18/23  2.  Patient will increase Lower Extremity Functional Scale to at least 50% to demonstrate improvement in functional tasks. Baseline: 25% Goal status: MET on 04/29/2023  3.  Patient will increase BERG to at least 42/56 to demonstrate decreased risk of falling. Baseline: 27/56 Goal status: Met on 06/24/23  4.  Pt will report being able to ambulate at least 20 minutes with LRAD without increased pain and no loss of balance to allow for community ambulation. Baseline:  Goal status: Ongoing  5.  Pt will increase LE strength to at least 4+/5 to allow him to perform sit to stand without UE use and navigate a curb with LRAD safely. Baseline:  Goal status: Met on 06/24/23   PLAN:  PT FREQUENCY: 2x/week  PT DURATION: 8 weeks  PLANNED INTERVENTIONS: Therapeutic exercises, Therapeutic activity, Neuromuscular re-education, Balance training, Gait training, Patient/Family education, Self Care, Joint mobilization, Joint manipulation, Stair training, Vestibular training, Canalith repositioning, Aquatic Therapy, Dry Needling, Electrical stimulation, Spinal manipulation, Spinal  mobilization, Cryotherapy, Moist heat, Taping, Traction, Ultrasound, Ionotophoresis 4mg /ml Dexamethasone , Manual therapy, and Re-evaluation  PLAN FOR NEXT SESSION: Discharge next visit at pool.    Robyne Christen, PT, DPT 06/24/23, 12:42 PM  [facility] [address], [address] [place], [place] [address] Phone # [phone] Fax [phone]

## 2023-06-26 ENCOUNTER — Encounter: Payer: Self-pay | Admitting: Physical Therapy

## 2023-06-26 ENCOUNTER — Ambulatory Visit: Payer: Medicare HMO | Admitting: Physical Therapy

## 2023-06-26 DIAGNOSIS — R2689 Other abnormalities of gait and mobility: Secondary | ICD-10-CM | POA: Diagnosis not present

## 2023-06-26 DIAGNOSIS — R262 Difficulty in walking, not elsewhere classified: Secondary | ICD-10-CM | POA: Diagnosis not present

## 2023-06-26 DIAGNOSIS — R293 Abnormal posture: Secondary | ICD-10-CM

## 2023-06-26 DIAGNOSIS — R2681 Unsteadiness on feet: Secondary | ICD-10-CM | POA: Diagnosis not present

## 2023-06-26 DIAGNOSIS — M6281 Muscle weakness (generalized): Secondary | ICD-10-CM | POA: Diagnosis not present

## 2023-06-26 NOTE — Therapy (Addendum)
OUTPATIENT PHYSICAL THERAPY TREATMENT NOTE AND DISCHARGE SUMMARY  Patient Name: Brandon [name] MRN: [id number] DOB:1945/08/31, 78 y.o., male Today's Date: 06/26/2023    END OF SESSION:  PT End of Session - 06/26/23 1149     Visit Number 37    Date for PT Re-Evaluation 06/28/23    Authorization Type [employer]    Authorization Time Period 04/22/2023 - 06/28/2023    Authorization - Visit Number 18    Authorization - Number of Visits 20    Progress Note Due on Visit 42    PT Start Time 1149    PT Stop Time 1223    PT Time Calculation (min) 34 min    Activity Tolerance Patient tolerated treatment well    Behavior During Therapy WFL for tasks assessed/performed                         Past Medical History:  Diagnosis Date   Allergy    Basal cell carcinoma    GERD (gastroesophageal reflux disease)    History of kidney cancer    benign; told not cancerous   Hypertension    Past Surgical History:  Procedure Laterality Date   CATARACT EXTRACTION Bilateral    CRANIOTOMY Bilateral 12/25/2021   Procedure: CRANIOTOMY HEMATOMA EVACUATION SUBDURAL;  Surgeon: Julio Sicks, MD;  Location: [facility] OR;  Service: Neurosurgery;  Laterality: Bilateral;   HERNIA REPAIR     x2   KIDNEY SURGERY Right    Had right kidney removed.    TONSILLECTOMY     VASECTOMY     Patient Active Problem List   Diagnosis Date Noted   Cognitive and neurobehavioral dysfunction following brain injury (HCC) 02/07/2022   Gait difficulty 02/07/2022   Traumatic brain injury with loss of consciousness (HCC) 12/30/2021   SDH (subdural hematoma) (HCC) 12/24/2021   Hyperlipidemia 02/13/2018   BPH (benign prostatic hyperplasia) 01/31/2017   Heart murmur 01/20/2016   HTN (hypertension) 03/09/2012    PCP: Shirline Frees, NP  REFERRING PROVIDER: Shirline Frees, NP  REFERRING DIAG: R26.81 (ICD-10-CM) - Gait instability  THERAPY DIAG:  Muscle weakness (generalized)  Balance problem  Unsteadiness  on feet  Difficulty in walking, not elsewhere classified  Abnormal posture  Rationale for Evaluation and Treatment: Rehabilitation  ONSET DATE: Pt and wife report that frequent falls have been occurring for the past year  SUBJECTIVE:   SUBJECTIVE STATEMENT: Ready for discharge but understands he can return if he regresses. Wife also verbally understands.   Pt continues to deny any new falls.    PERTINENT HISTORY: Subdural Hematoma, HTN, GERD, Basal Cell Carcinoma, Hx of benign kidney mass, OA  PAIN:  Are you having pain? no: NPRS scale: 0-4/10 Pain location: bilateral knees Pain description: sore Aggravating factors: unknown Relieving factors: Voltaren gel  PRECAUTIONS: Fall  RED FLAGS: None   WEIGHT BEARING RESTRICTIONS: No  FALLS:  Has patient fallen in last 6 months? Yes. Number of falls 3  LIVING ENVIRONMENT: Lives with: lives with their spouse Lives in: House/apartment Stairs:  one level home, one step to enter porch Has following equipment at home: Single point cane, Environmental consultant - 2 wheeled, Environmental consultant - 4 wheeled, shower chair, and Grab bars  OCCUPATION: retired  PLOF: Requires assistive device for independence and Leisure: walking with wife, going out to eat, movies  PATIENT GOALS: To be able to walk into wedding venue for granddaughter's wedding on September 21st, 2024.  NEXT MD VISIT: Shirline Frees on 07/01/2022  OBJECTIVE:   DIAGNOSTIC FINDINGS: Head CT on 04/27/2023: IMPRESSION: 1. Stable 6 mm acute on chronic left subdural hematoma. No associated midline shift. 2. Stable 6 mm chronic right subdural hematoma. 3. No new intracranial abnormality. 4. No acute displaced fracture or traumatic listhesis of the cervical spine. 5. Persistent right apical subpleural 1.5 cm ground-glass nodular-like airspace opacity (increased in size from 2018). Adenocarcinoma is not excluded. Additional imaging evaluation or consultation with Pulmonology or Thoracic Surgery  recommended.  PATIENT SURVEYS:  Eval:  LEFS 20 / 80 = 25.0 % 03/05/2023:  Lower Extremity Functional Score: 35 / 80 = 43.8 % 04/29/2023:  Lower Extremity Functional Score: 59 / 80 = 73.8 %  COGNITION: Overall cognitive status:  wife reports occasionally pt has word finding problems and some memory impairments      SENSATION: Wife reports that sometimes left hand has "spasms"  MUSCLE LENGTH: Hamstrings: Tightness bilaterally  POSTURE: rounded shoulders, forward head, and flexed trunk    LOWER EXTREMITY ROM:  WFL  LOWER EXTREMITY MMT:  Eval: Right LE strength grossly 4-/5 Left LE strength grossly 4 to 4+/5 throughout  06/24/2023: Right LE strength of 5-/5 Left LE strength of 4+/5  FUNCTIONAL TESTS:  Eval:   5 times sit to stand: 14.96 sec with UE use required Timed up and go (TUG): 13.71 sec without UE device with unsteadiness noted especially with 180 degree turn BERG:  27/56  01/24/2023: 3 minute walk:  378 ft with SPC (pt with shuffling gait pattern)  02/26/2023: Timed up and go (TUG): 11.69 sec 5 times sit to stand: 13.28 sec with UE use required 3 minute walk: 470 ft without assistive device  03/05/2023: BERG: 39/56  04/02/2023: 3 minute walk: 454 ft  04/09/2023: Timed up and go (TUG): 10.16 sec 5 times sit to stand: 15.81 sec first time, 13.94 sec second time 3 minute walk: 499 ft  04/22/2023: 6 minute walk:  955 ft  04/29/2023: 5 times sit to stand: 18.61 sec first test, 16.99 sec second time BERG: 41/56 6 minute walk: 980 ft  06/04/2023: 6 minute walk:  980 ft  06/11/2023: 5 times sit to stand: 12.07 sec 6 minute walk:  1160 ft  06/24/2023: BERG 47/56  GAIT: Distance walked: >200 ft Assistive device utilized: Single point cane Level of assistance: SBA Comments: Pt with unsteady gait noted and requires SBA   TODAY'S TREATMENT:    06/26/23:Pt arrives for aquatic physical therapy. Treatment took place in 3.5-5.5 feet of water. Water  temperature was 92 degrees F. Pt entered the pool via stairs step to step with heavy use of the rails. Pt requires buoyancy of water for support and to offload joints with strengthening exercises.   Seated water bench with 75% submersion Pt performed seated LE AROM exercises with concurrent discussion of current status and pain assessment. 75% depth water walking with UE floats for push/pull/ 10x each direction. Hip 3 ways 20x Bil with UE assit on side of pool  No UE support for heel raises 10x2. High knee forward marching 6in place no UE for support 2x10.  Tandem walking (slightly wider than heel-toe to increase success) 4 lengths holding hand floats. Static stance with small step forward and UE horizontal adb/add with green water bells 2x10 Bil then forward and back 2x10. RTLE step ups 2x15, short seated rest break d/t LE fatigue. Steps up RT 2x10 both holding rails and not holding rails. Alt toe taps to second step 2x15 no UE support. Marching 10x, then  tapping opposite knee with hand.   DATE: 06/24/2023 Nustep level 5 x6 min with PT present to discuss status BERG 47/56 Sit to/from stand from PT mat x7 (without UE use) Seated LAQ, marching, and hip ER with 6# on ankles 2x10 each bilat Standing with 6# on bilat ankles: hip abduction, hip extension, high marching.  2x10 each bilat Ambulation down to cancer gym and back with 6# x2 with seated recovery period in between Seated hamstring stretch 2x20 sec bilat   06/21/23:Pt arrives for aquatic physical therapy. Treatment took place in 3.5-5.5 feet of water. Water temperature was 91 degrees F. Pt entered the pool via stairs step to step with heavy use of the rails. Pt requires buoyancy of water for support and to offload joints with strengthening exercises.   Seated water bench with 75% submersion Pt performed seated LE AROM exercises with concurrent discussion of current status and pain assessment. 75% depth water walking with UE floats for push/pull/  10x each direction. Hip 3 ways 20x Bil with UE assit on side of pool  No UE support for heel raises 10x2. High knee forward marching 6in place no UE for support 2x10.  Tandem walking (slightly wider than heel-toe to increase success) 4 lengths holding hand floats. Static stance with small step forward and UE horizontal adb/add with green water bells 2x10 Bil then forward and back 2x10. RTLE step ups 2x15, short seated rest break d/t LE fatigue. Steps up RT 2x10 both holding rails and not holding rails. Alt toe taps to second step 2x15 no UE support. Marching 10x, then tapping opposite knee with hand.    PATIENT EDUCATION:  Education details: aquatic exercise technique and rationale Person educated: Patient and Spouse Education method: Explanation, Facilities manager,  Education comprehension: verbalized understanding  HOME EXERCISE PROGRAM: Access Code: [email or web address] URL: [email or web address] Date: 06/24/2023 Prepared by: Clydie Braun Menke  Exercises - Sit to Stand  - 1 x daily - 7 x weekly - 2 sets - 10 reps - Standing Hip Abduction with Counter Support  - 1 x daily - 7 x weekly - 2 sets - 10 reps - Standing Hip Extension with Counter Support  - 1 x daily - 7 x weekly - 2 sets - 10 reps - Standing March with Counter Support  - 1 x daily - 7 x weekly - 2 sets - 10 reps - Heel Raises with Counter Support  - 1 x daily - 7 x weekly - 2 sets - 10 reps - Standing Tandem Balance with Counter Support  - 1 x daily - 7 x weekly - 2 reps - 20 sec hold - Seated Hamstring Stretch  - 1 x daily - 7 x weekly - 2 reps - 20 sec hold  ASSESSMENT:  CLINICAL IMPRESSION: Pt is independent ,along with his wife, in aquatic exercises that address his balance and LE strength. Pt not always steady with his balance exercises but is safe performing them while in the pool. Pt and wife understand they can return to PT if pt regresses.  OBJECTIVE IMPAIRMENTS: decreased balance, difficulty walking, decreased strength,  impaired flexibility, postural dysfunction, and pain.   ACTIVITY LIMITATIONS: carrying, lifting, bending, standing, squatting, and stairs  PARTICIPATION LIMITATIONS: cleaning and community activity  PERSONAL FACTORS: Past/current experiences, Time since onset of injury/illness/exacerbation, and 3+ comorbidities: subdural hematoma, HTN, OA  are also affecting patient's functional outcome.   REHAB POTENTIAL: Good  CLINICAL DECISION MAKING: Evolving/moderate complexity  EVALUATION COMPLEXITY: Moderate   GOALS: Goals reviewed with patient?  Yes  SHORT TERM GOALS: Target date: 02/01/2023 Pt will be independent with initial HEP. Baseline: Goal status: MET  2.  Pt will improve ambulation safety with least restrictive assistive device to allow him to safely ambulate distances required for granddaughter's wedding on 02/02/2023. Baseline:  Goal status: Met   LONG TERM GOALS: Target date: 06/28/2023   Pt will be independent with advanced HEP to allow for self progression post discharge. Baseline:  Goal status: Met on 06/18/23  2.  Patient will increase Lower Extremity Functional Scale to at least 50% to demonstrate improvement in functional tasks. Baseline: 25% Goal status: MET on 04/29/2023  3.  Patient will increase BERG to at least 42/56 to demonstrate decreased risk of falling. Baseline: 27/56 Goal status: Met on 06/24/23  4.  Pt will report being able to ambulate at least 20 minutes with LRAD without increased pain and no loss of balance to allow for community ambulation. Baseline:  Goal status: MET  5.  Pt will increase LE strength to at least 4+/5 to allow him to perform sit to stand without UE use and navigate a curb with LRAD safely. Baseline:  Goal status: Met on 06/24/23   PLAN:  PT FREQUENCY: 2x/week  PT DURATION: 8 weeks  PLANNED INTERVENTIONS: Therapeutic exercises, Therapeutic activity, Neuromuscular re-education, Balance training, Gait training, Patient/Family  education, Self Care, Joint mobilization, Joint manipulation, Stair training, Vestibular training, Canalith repositioning, Aquatic Therapy, Dry Needling, Electrical stimulation, Spinal manipulation, Spinal mobilization, Cryotherapy, Moist heat, Taping, Traction, Ultrasound, Ionotophoresis 4mg /ml Dexamethasone, Manual therapy, and Re-evaluation   Ane Payment, PTA 06/26/23 12:18 PM     [facility] [address], [address] [place], [place] [address] Phone # [phone] Fax [phone]   PHYSICAL THERAPY DISCHARGE SUMMARY  See above for summary.  Patient agrees to discharge. Patient goals were met. Patient is being discharged due to meeting the stated rehab goals.  Clydie Braun Menke, PT, DPT 06/26/23, 12:30 PM

## 2023-07-02 ENCOUNTER — Ambulatory Visit: Payer: Medicare HMO

## 2023-07-02 ENCOUNTER — Ambulatory Visit (INDEPENDENT_AMBULATORY_CARE_PROVIDER_SITE_OTHER): Payer: Medicare HMO | Admitting: Adult Health

## 2023-07-02 ENCOUNTER — Encounter: Payer: Self-pay | Admitting: Adult Health

## 2023-07-02 VITALS — BP 120/80 | HR 68 | Temp 98.1°F | Ht 78.0 in | Wt 248.0 lb

## 2023-07-02 DIAGNOSIS — M1711 Unilateral primary osteoarthritis, right knee: Secondary | ICD-10-CM | POA: Diagnosis not present

## 2023-07-02 DIAGNOSIS — E1169 Type 2 diabetes mellitus with other specified complication: Secondary | ICD-10-CM

## 2023-07-02 DIAGNOSIS — I10 Essential (primary) hypertension: Secondary | ICD-10-CM

## 2023-07-02 DIAGNOSIS — Z7984 Long term (current) use of oral hypoglycemic drugs: Secondary | ICD-10-CM | POA: Diagnosis not present

## 2023-07-02 DIAGNOSIS — M25561 Pain in right knee: Secondary | ICD-10-CM

## 2023-07-02 DIAGNOSIS — G8929 Other chronic pain: Secondary | ICD-10-CM | POA: Diagnosis not present

## 2023-07-02 DIAGNOSIS — M11261 Other chondrocalcinosis, right knee: Secondary | ICD-10-CM | POA: Diagnosis not present

## 2023-07-02 LAB — POCT GLYCOSYLATED HEMOGLOBIN (HGB A1C): Hemoglobin A1C: 7 % — AB (ref 4.0–5.6)

## 2023-07-02 MED ORDER — METFORMIN HCL ER 500 MG PO TB24: 500.0000 mg | ORAL_TABLET | Freq: Every day | ORAL | 0 refills | Status: DC

## 2023-07-02 NOTE — Progress Notes (Signed)
Subjective:    Patient ID: Brandon [name], male    DOB: 1945/12/22, 78 y.o.   MRN: [id number]  HPI 78 year old male who  has a past medical history of Allergy, Basal cell carcinoma, GERD (gastroesophageal reflux disease), History of kidney cancer, and Hypertension.  Presents to the office today for follow-up regarding diabetes and hypertension  Diabetes Mellitus - diet controlled. He has been snacking on sweets and carbs. Lab Results  Component Value Date   HGBA1C 6.5 12/26/2022   HGBA1C 6.6 (H) 10/20/2021   HGBA1C 6.5 06/30/2020   Wt Readings from Last 3 Encounters:  07/02/23 248 lb (112.5 kg)  04/10/23 246 lb 11.2 oz (111.9 kg)  02/07/23 243 lb (110.2 kg)   HTN - managed with lisinopril/hydrochlorothiazide-25 mg daily.  He denies dizziness, lightheadedness, chest pain, or shortness of breath Lab Results  Component Value Date   CHOL 104 12/26/2022   HDL 36.50 (L) 12/26/2022   LDLCALC 60 12/26/2022   TRIG 36.0 12/26/2022   CHOLHDL 3 12/26/2022   Chronic right knee pain - pain seems to be getting worse. Last Xray showed Chondrocalcinosis in 2023. He will have swelling at times. Voltaren gel helps but does not resolve the pain. He feels like his knee gives out at times. Denies popping or clicking sensation.   Review of Systems See HPI   Past Medical History:  Diagnosis Date   Allergy    Basal cell carcinoma    GERD (gastroesophageal reflux disease)    History of kidney cancer    benign; told not cancerous   Hypertension     Social History   Socioeconomic History   Marital status: Married    Spouse name: Not on file   Number of children: Not on file   Years of education: Not on file   Highest education level: Bachelor's degree (e.g., BA, AB, BS)  Occupational History   Not on file  Tobacco Use   Smoking status: Former   Smokeless tobacco: Never  Vaping Use   Vaping status: Never Used  Substance and Sexual Activity   Alcohol use: Not Currently    Comment:  "Beer every once in a while"   Drug use: No   Sexual activity: Not Currently  Other Topics Concern   Not on file  Social History Narrative   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   Married    Three children (Two in [place] and [place])    Right handed   Social Drivers of Health   Financial Resource Strain: Low Risk  (04/10/2023)   Overall Financial Resource Strain (CARDIA)    Difficulty of Paying Living Expenses: Not hard at all  Food Insecurity: No Food Insecurity (04/10/2023)   Hunger Vital Sign    Worried About Running Out of Food in the Last Year: Never true    Ran Out of Food in the Last Year: Never true  Transportation Needs: No Transportation Needs (04/10/2023)   PRAPARE - Administrator, Civil Service (Medical): No    Lack of Transportation (Non-Medical): No  Physical Activity: Sufficiently Active (04/10/2023)   Exercise Vital Sign    Days of Exercise per Week: 7 days    Minutes of Exercise per Session: 40 min  Stress: No Stress Concern Present (04/10/2023)   [employer] of Occupational Health - Occupational Stress Questionnaire    Feeling of Stress : Not at all  Social Connections: Socially Integrated (04/10/2023)   Social Connection and  Isolation Panel [NHANES]    Frequency of Communication with Friends and Family: More than three times a week    Frequency of Social Gatherings with Friends and Family: More than three times a week    Attends Religious Services: More than 4 times per year    Active Member of [employer] or Organizations: Yes    Attends Banker Meetings: More than 4 times per year    Marital Status: Married  Catering manager Violence: Not At Risk (04/10/2023)   Humiliation, Afraid, Rape, and Kick questionnaire    Fear of Current or Ex-Partner: No    Emotionally Abused: No    Physically Abused: No    Sexually Abused: No    Past Surgical History:  Procedure Laterality Date   CATARACT EXTRACTION Bilateral    CRANIOTOMY Bilateral  12/25/2021   Procedure: CRANIOTOMY HEMATOMA EVACUATION SUBDURAL;  Surgeon: Julio Sicks, MD;  Location: [facility] OR;  Service: Neurosurgery;  Laterality: Bilateral;   HERNIA REPAIR     x2   KIDNEY SURGERY Right    Had right kidney removed.    TONSILLECTOMY     VASECTOMY      Family History  Problem Relation Age of Onset   Cancer Mother        Breast    Alzheimer's disease Mother    Cancer Father        Lung    Colon cancer Neg Hx     No Known Allergies  Current Outpatient Medications on File Prior to Visit  Medication Sig Dispense Refill   acetaminophen (TYLENOL) 500 MG tablet Take 1 tablet (500 mg total) by mouth 2 (two) times daily as needed for mild pain. 30 tablet 0   atorvastatin (LIPITOR) 20 MG tablet TAKE 1 TABLET BY MOUTH EVERY DAY 90 tablet 0   B Complex Vitamins (VITAMIN B COMPLEX) TABS Take by mouth daily. 30 tablet 0   butalbital-acetaminophen-caffeine (FIORICET) 50-325-40 MG tablet Take 1 tablet by mouth every 6 (six) hours as needed for headache. 10 tablet 0   cetirizine (ZYRTEC) 10 MG tablet Take 10 mg by mouth daily.     Cholecalciferol 25 MCG (1000 UT) tablet Take 1 tablet (1,000 Units total) by mouth daily. 30 tablet 0   diclofenac Sodium (VOLTAREN) 1 % GEL Apply 4 g topically 3 (three) times daily. 100 g 0   finasteride (PROSCAR) 5 MG tablet Take 1 tablet (5 mg total) by mouth daily. 90 tablet 0   latanoprost (XALATAN) 0.005 % ophthalmic solution Place 1 drop into both eyes at bedtime.     lisinopril-hydrochlorothiazide (ZESTORETIC) 20-25 MG tablet TAKE 1 TABLET BY MOUTH EVERY DAY 90 tablet 0   tamsulosin (FLOMAX) 0.4 MG CAPS capsule TAKE 1 CAPSULE BY MOUTH TWICE A DAY 180 capsule 0   traZODone (DESYREL) 100 MG tablet Take 1 tablet (100 mg total) by mouth at bedtime. 30 tablet 0   vitamin C (ASCORBIC ACID) 500 MG tablet Take 500 mg by mouth daily.     No current facility-administered medications on file prior to visit.    BP 120/80   Pulse 68   Temp 98.1 F (36.7  C) (Oral)   Ht 6\' 6"  (1.981 m)   Wt 248 lb (112.5 kg)   SpO2 97%   BMI 28.66 kg/m       Objective:   Physical Exam Vitals and nursing note reviewed.  Constitutional:      Appearance: Normal appearance. He is obese.  Cardiovascular:  Rate and Rhythm: Normal rate and regular rhythm.     Pulses: Normal pulses.     Heart sounds: Normal heart sounds.  Pulmonary:     Effort: Pulmonary effort is normal.     Breath sounds: Normal breath sounds.  Skin:    General: Skin is warm and dry.  Neurological:     General: No focal deficit present.     Mental Status: He is alert and oriented to person, place, and time.  Psychiatric:        Mood and Affect: Mood normal.        Behavior: Behavior normal.        Thought Content: Thought content normal.        Judgment: Judgment normal.        Assessment & Plan:  1. Type 2 diabetes mellitus with other specified complication, without long-term current use of insulin (HCC) (Primary)  - POC HgB A1c- 7.0. Will start him on Metformin 500 mg ER daily  - Cut back on sweets and carbs  - Follow up in 3 months  - metFORMIN (GLUCOPHAGE-XR) 500 MG 24 hr tablet; Take 1 tablet (500 mg total) by mouth daily with breakfast.  Dispense: 90 tablet; Refill: 0  2. Essential hypertension - Well controlled. No change in medication   3. Chronic pain of right knee - DG Knee 1-2 Views Right; Future - Consider steroid injection or referral to ortho   Shirline Frees, NP

## 2023-07-02 NOTE — Patient Instructions (Signed)
Health Maintenance Due  Topic Date Due   COVID-19 Vaccine (6 - 2024-25 season) 03/14/2023       04/10/2023    8:59 AM 12/26/2022    7:54 AM 02/07/2022    1:35 PM  Depression screen PHQ 2/9  Decreased Interest 0 0 0  Down, Depressed, Hopeless 0 0 0  PHQ - 2 Score 0 0 0  Altered sleeping  0 0  Tired, decreased energy  0 0  Change in appetite  0 0  Feeling bad or failure about yourself   0 0  Trouble concentrating  0 0  Moving slowly or fidgety/restless  0 1  Suicidal thoughts   0  PHQ-9 Score  0 1  Difficult doing work/chores  Not difficult at all Not difficult at all

## 2023-07-03 DIAGNOSIS — C44321 Squamous cell carcinoma of skin of nose: Secondary | ICD-10-CM | POA: Diagnosis not present

## 2023-07-16 ENCOUNTER — Encounter: Payer: Self-pay | Admitting: Adult Health

## 2023-07-24 ENCOUNTER — Ambulatory Visit (INDEPENDENT_AMBULATORY_CARE_PROVIDER_SITE_OTHER): Admitting: Adult Health

## 2023-07-24 ENCOUNTER — Encounter: Payer: Self-pay | Admitting: Adult Health

## 2023-07-24 VITALS — BP 124/60 | HR 75 | Temp 98.0°F | Wt 248.0 lb

## 2023-07-24 DIAGNOSIS — G8929 Other chronic pain: Secondary | ICD-10-CM

## 2023-07-24 DIAGNOSIS — R42 Dizziness and giddiness: Secondary | ICD-10-CM | POA: Diagnosis not present

## 2023-07-24 DIAGNOSIS — M25561 Pain in right knee: Secondary | ICD-10-CM

## 2023-07-24 MED ORDER — METHYLPREDNISOLONE ACETATE 80 MG/ML IJ SUSP
80.0000 mg | Freq: Once | INTRAMUSCULAR | Status: AC
Start: 2023-07-24 — End: 2023-07-24
  Administered 2023-07-24: 80 mg via INTRAMUSCULAR

## 2023-07-24 MED ORDER — MECLIZINE HCL 25 MG PO TABS: 25.0000 mg | ORAL_TABLET | Freq: Three times a day (TID) | ORAL | 0 refills | Status: AC | PRN

## 2023-07-24 NOTE — Progress Notes (Signed)
 Subjective:    Patient ID: Brandon [name], male    DOB: 1945/11/11, 78 y.o.   MRN: [id number]  Knee Pain     78 year old male who  has a past medical history of Allergy, Basal cell carcinoma, GERD (gastroesophageal reflux disease), History of kidney cancer, and Hypertension.  He presents to the office today for Chronic right knee pain. He was seen about 3 weeks ago as the  pain seemed to be getting worse. Last Xray showed Chondrocalcinosis in 2023. He will have swelling at times. Voltaren gel helps but does not resolve the pain. He feels like his knee gives out at times. Denies popping or clicking sensation.   We updated his xray which showed  Some progression of degenerative arthritis of the right knee primarily involving the medial and lateral joint spaces with associated chondrocalcinosis.  Today he would like to try a cortisone injection to see if this helps with his pain   Additionally, he reports that 2-3 days ago he was sitting in his chair readings and started to feel dizzy. When he got up out of the chair the dizziness continued and became worse. He felt like the world was spinning around him. The next day his symptoms were still present but milder. He has not had any symptoms today. He does have a remote history of vertigo and felt like this was similar to when he had his last bout.   Review of Systems See HPI   Past Medical History:  Diagnosis Date   Allergy    Basal cell carcinoma    GERD (gastroesophageal reflux disease)    History of kidney cancer    benign; told not cancerous   Hypertension     Social History   Socioeconomic History   Marital status: Married    Spouse name: Not on file   Number of children: Not on file   Years of education: Not on file   Highest education level: Bachelor's degree (e.g., BA, AB, BS)  Occupational History   Not on file  Tobacco Use   Smoking status: Former   Smokeless tobacco: Never  Vaping Use   Vaping status: Never Used   Substance and Sexual Activity   Alcohol use: Not Currently    Comment: "Beer every once in a while"   Drug use: No   Sexual activity: Not Currently  Other Topics Concern   Not on file  Social History Narrative   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   Married    Three children ([place] in [place]    Right handed   Social Drivers of Health   Financial Resource Strain: Low Risk  (04/10/2023)   Overall Financial Resource Strain (CARDIA)    Difficulty of Paying Living Expenses: Not hard at all  Food Insecurity: No Food Insecurity (04/10/2023)   Hunger Vital Sign    Worried About Running Out of Food in the Last Year: Never true    Ran Out of Food in the Last Year: Never true  Transportation Needs: No Transportation Needs (04/10/2023)   PRAPARE - Administrator, Civil Service (Medical): No    Lack of Transportation (Non-Medical): No  Physical Activity: Sufficiently Active (04/10/2023)   Exercise Vital Sign    Days of Exercise per Week: 7 days    Minutes of Exercise per Session: 40 min  Stress: No Stress Concern Present (04/10/2023)   [employer] of Occupational Health - Occupational Stress Questionnaire  Feeling of Stress : Not at all  Social Connections: Socially Integrated (04/10/2023)   Social Connection and Isolation Panel [NHANES]    Frequency of Communication with Friends and Family: More than three times a week    Frequency of Social Gatherings with Friends and Family: More than three times a week    Attends Religious Services: More than 4 times per year    Active Member of [employer] or Organizations: Yes    Attends Banker Meetings: More than 4 times per year    Marital Status: Married  Catering manager Violence: Not At Risk (04/10/2023)   Humiliation, Afraid, Rape, and Kick questionnaire    Fear of Current or Ex-Partner: No    Emotionally Abused: No    Physically Abused: No    Sexually Abused: No    Past Surgical History:  Procedure  Laterality Date   CATARACT EXTRACTION Bilateral    CRANIOTOMY Bilateral 12/25/2021   Procedure: CRANIOTOMY HEMATOMA EVACUATION SUBDURAL;  Surgeon: Julio Sicks, MD;  Location: [facility] OR;  Service: Neurosurgery;  Laterality: Bilateral;   HERNIA REPAIR     x2   KIDNEY SURGERY Right    Had right kidney removed.    TONSILLECTOMY     VASECTOMY      Family History  Problem Relation Age of Onset   Cancer Mother        Breast    Alzheimer's disease Mother    Cancer Father        Lung    Colon cancer Neg Hx     No Known Allergies  Current Outpatient Medications on File Prior to Visit  Medication Sig Dispense Refill   acetaminophen (TYLENOL) 500 MG tablet Take 1 tablet (500 mg total) by mouth 2 (two) times daily as needed for mild pain. 30 tablet 0   atorvastatin (LIPITOR) 20 MG tablet TAKE 1 TABLET BY MOUTH EVERY DAY 90 tablet 0   B Complex Vitamins (VITAMIN B COMPLEX) TABS Take by mouth daily. 30 tablet 0   butalbital-acetaminophen-caffeine (FIORICET) 50-325-40 MG tablet Take 1 tablet by mouth every 6 (six) hours as needed for headache. 10 tablet 0   cetirizine (ZYRTEC) 10 MG tablet Take 10 mg by mouth daily.     Cholecalciferol 25 MCG (1000 UT) tablet Take 1 tablet (1,000 Units total) by mouth daily. 30 tablet 0   diclofenac Sodium (VOLTAREN) 1 % GEL Apply 4 g topically 3 (three) times daily. 100 g 0   finasteride (PROSCAR) 5 MG tablet Take 1 tablet (5 mg total) by mouth daily. 90 tablet 0   latanoprost (XALATAN) 0.005 % ophthalmic solution Place 1 drop into both eyes at bedtime.     lisinopril-hydrochlorothiazide (ZESTORETIC) 20-25 MG tablet TAKE 1 TABLET BY MOUTH EVERY DAY 90 tablet 0   metFORMIN (GLUCOPHAGE-XR) 500 MG 24 hr tablet Take 1 tablet (500 mg total) by mouth daily with breakfast. 90 tablet 0   tamsulosin (FLOMAX) 0.4 MG CAPS capsule TAKE 1 CAPSULE BY MOUTH TWICE A DAY 180 capsule 0   traZODone (DESYREL) 100 MG tablet Take 1 tablet (100 mg total) by mouth at bedtime. 30 tablet 0    vitamin C (ASCORBIC ACID) 500 MG tablet Take 500 mg by mouth daily.     No current facility-administered medications on file prior to visit.    BP 124/60   Pulse 75   Temp 98 F (36.7 C) (Oral)   Wt 248 lb (112.5 kg)   SpO2 97%   BMI 28.66 kg/m  Objective:   Physical Exam Vitals and nursing note reviewed.  Constitutional:      Appearance: Normal appearance.  Eyes:     Extraocular Movements: Extraocular movements intact.     Right eye: No nystagmus.     Left eye: No nystagmus.     Conjunctiva/sclera: Conjunctivae normal.     Pupils: Pupils are equal, round, and reactive to light.  Cardiovascular:     Rate and Rhythm: Normal rate and regular rhythm.     Pulses: Normal pulses.     Heart sounds: Normal heart sounds.  Pulmonary:     Effort: Pulmonary effort is normal.     Breath sounds: Normal breath sounds.  Skin:    General: Skin is warm and dry.     Capillary Refill: Capillary refill takes less than 2 seconds.  Neurological:     General: No focal deficit present.     Mental Status: He is alert and oriented to person, place, and time.  Psychiatric:        Mood and Affect: Mood normal.        Behavior: Behavior normal.        Thought Content: Thought content normal.        Judgment: Judgment normal.       Assessment & Plan:  1. Chronic pain of right knee (Primary) Discussed risks and benefits of corticosteroid injection and patient consented.  After prepping skin with betadine, injected 80 mg depomedrol and 2 cc of plain xylocaine with 22 gauge one and one half inch needle using anterolateral approach and pt tolerated well.  - methylPREDNISolone acetate (DEPO-MEDROL) injection 80 mg  2. Dizziness - Likely vertigo. No neuro deficits today. Will send in Meclizine to have on hand.  - Follow up as needed  - meclizine (ANTIVERT) 25 MG tablet; Take 1 tablet (25 mg total) by mouth 3 (three) times daily as needed.  Dispense: 30 tablet; Refill: 0  Shirline Frees,  NP  Time spent with patient today was 32 minutes which consisted of chart review, discussing chronic knee pain and vertigo, work up, treatment answering questions and documentation.

## 2023-07-25 ENCOUNTER — Ambulatory Visit: Payer: Self-pay | Admitting: Adult Health

## 2023-07-25 ENCOUNTER — Encounter: Payer: Self-pay | Admitting: Adult Health

## 2023-07-25 NOTE — Telephone Encounter (Signed)
 Copied from CRM [id number]. Topic: Clinical - Red Word Triage >> Jul 25, 2023 10:00 AM Truddie Crumble wrote: Reason for CRM: patient wife called stating the doctor was supposed to have [facility] but the medication is not there and the patient fell again last night because he was dizzy Reason for Disposition  [1] MODERATE dizziness (e.g., interferes with normal activities) AND [2] has been evaluated by doctor (or NP/PA) for this    He fell again last night.    The medicine ordered from his OV yesterday is not at the pharmacy.  Answer Assessment - Initial Assessment Questions 1. DESCRIPTION: "Describe your dizziness."     Wife calling in.   He saw [facility] yesterday.   He has been falling and getting dizzy.   He fell last night again.   This week he got real dizzy on Sunday.  He fell onto the sofa, not on the floor.   Kandee Keen was made aware of this.   Kandee Keen thinks this is vertigo    He had this 14-16 years ago.   Kandee Keen was to call in a medicine for the vertigo. 2. LIGHTHEADED: "Do you feel lightheaded?" (e.g., somewhat faint, woozy, weak upon standing)     No 3. VERTIGO: "Do you feel like either you or the room is spinning or tilting?" (i.e. vertigo)     Vertigo I heard him fall last night.   He did not hit his head.   No bleeding or injuries.   Me and his son got him up.    He has a bad knee so Kandee Keen gave hinm a shot in it yesterday.   [facility] and [facility].   4. SEVERITY: "How bad is it?"  "Do you feel like you are going to faint?" "Can you stand and walk?"   - MILD: Feels slightly dizzy, but walking normally.   - MODERATE: Feels unsteady when walking, but not falling; interferes with normal activities (e.g., school, work).   - SEVERE: Unable to walk without falling, or requires assistance to walk without falling; feels like passing out now.      Moderately dizzy. 5. ONSET:  "When did the dizziness begin?"     He saw Shirline Frees  yesterday 6. AGGRAVATING FACTORS: "Does anything make it worse?" (e.g., standing, change in head position)     Getting up from his recliner. 7. HEART RATE: "Can you tell me your heart rate?" "How many beats in 15 seconds?"  (Note: not all patients can do this)       Not asked 8. CAUSE: "What do you think is causing the dizziness?"     Vertigo per [facility] 9. RECURRENT SYMPTOM: "Have you had dizziness before?" If Yes, ask: "When was the last time?" "What happened that time?"     Yes   Vertigo  10. OTHER SYMPTOMS: "Do you have any other symptoms?" (e.g., fever, chest pain, vomiting, diarrhea, bleeding)       Just the dizziness from the vertigo 11. PREGNANCY: "Is there any chance you are pregnant?" "When was your last menstrual period?"       N/A  Protocols used: Dizziness - Lightheadedness-A-AH  Chief Complaint: Pt fell again last night.   Saw Cory Nafziger yesterday for vertigo.   Medicine not at pharmacy this morning.    While on the phone with me someone from the office called on the other line.  [facility] has been [facility] in [facility].   She's going to get it now.   Meclizine.   Symptoms: dizziness and fell again last night.   No injuries Frequency: Just once last night Pertinent Negatives: Patient denies injuries Disposition: [] ED /[] Urgent Care (no appt availability in office) / [] Appointment(In office/virtual)/ []  [facility]/ [] Home Care/ [] Refused Recommended Disposition /[] [facility]/ [x]  Follow-up with PCP Additional Notes: She is going to [facility].   She got a call on the other line while I held.   I sent this message to Kandee Keen to make him aware pt had fallen again.

## 2023-07-25 NOTE — Telephone Encounter (Signed)
 This has been taking care of. Pt spouse notified that this was refilled yesterday. Called pharmacy and they confirmed that the Rx was ready for pick up. Pt spouse advised of update.

## 2023-08-01 DIAGNOSIS — L578 Other skin changes due to chronic exposure to nonionizing radiation: Secondary | ICD-10-CM | POA: Diagnosis not present

## 2023-08-01 DIAGNOSIS — L570 Actinic keratosis: Secondary | ICD-10-CM | POA: Diagnosis not present

## 2023-08-12 ENCOUNTER — Other Ambulatory Visit: Payer: Self-pay | Admitting: Adult Health

## 2023-08-12 DIAGNOSIS — I10 Essential (primary) hypertension: Secondary | ICD-10-CM

## 2023-09-03 ENCOUNTER — Ambulatory Visit: Admitting: Adult Health

## 2023-10-03 ENCOUNTER — Encounter: Payer: Self-pay | Admitting: Adult Health

## 2023-10-03 ENCOUNTER — Ambulatory Visit (INDEPENDENT_AMBULATORY_CARE_PROVIDER_SITE_OTHER): Admitting: Adult Health

## 2023-10-03 VITALS — BP 102/70 | HR 85 | Temp 98.5°F | Ht 78.0 in | Wt 249.0 lb

## 2023-10-03 DIAGNOSIS — Z7984 Long term (current) use of oral hypoglycemic drugs: Secondary | ICD-10-CM

## 2023-10-03 DIAGNOSIS — G8929 Other chronic pain: Secondary | ICD-10-CM

## 2023-10-03 DIAGNOSIS — M25561 Pain in right knee: Secondary | ICD-10-CM

## 2023-10-03 DIAGNOSIS — E119 Type 2 diabetes mellitus without complications: Secondary | ICD-10-CM

## 2023-10-03 DIAGNOSIS — I10 Essential (primary) hypertension: Secondary | ICD-10-CM

## 2023-10-03 LAB — POCT GLYCOSYLATED HEMOGLOBIN (HGB A1C): Hemoglobin A1C: 7 % — AB (ref 4.0–5.6)

## 2023-10-03 MED ORDER — METFORMIN HCL ER 500 MG PO TB24
500.0000 mg | ORAL_TABLET | Freq: Every day | ORAL | 1 refills | Status: AC
Start: 2023-10-03 — End: ?

## 2023-10-03 NOTE — Patient Instructions (Signed)
 Health Maintenance Due  Topic Date Due   COVID-19 Vaccine (6 - Pfizer risk 2024-25 season) 07/17/2023       04/10/2023    8:59 AM 12/26/2022    7:54 AM 02/07/2022    1:35 PM  Depression screen PHQ 2/9  Decreased Interest 0 0 0  Down, Depressed, Hopeless 0 0 0  PHQ - 2 Score 0 0 0  Altered sleeping  0 0  Tired, decreased energy  0 0  Change in appetite  0 0  Feeling bad or failure about yourself   0 0  Trouble concentrating  0 0  Moving slowly or fidgety/restless  0 1  Suicidal thoughts   0  PHQ-9 Score  0 1  Difficult doing work/chores  Not difficult at all Not difficult at all

## 2023-10-03 NOTE — Progress Notes (Signed)
 Subjective:    Patient ID: Brandon [name], male    DOB: 04-Sep-1945, 78 y.o.   MRN: [id number]  HPI 78 year old male who  has a past medical history of Allergy, Basal cell carcinoma, GERD (gastroesophageal reflux disease), History of kidney cancer, and Hypertension.  He presents to the office today for follow up regarding DM/HTN/chronic right knee pain  Diabetes Mellitus - maintained on metformin  500 mg BID.  He has been snacking on sweets and carbs. Lab Results  Component Value Date   HGBA1C 7.0 (A) 10/03/2023   HGBA1C 7.0 (A) 07/02/2023   HGBA1C 6.5 12/26/2022   Wt Readings from Last 3 Encounters:  10/03/23 249 lb (112.9 kg)  07/24/23 248 lb (112.5 kg)  07/02/23 248 lb (112.5 kg)   HTN - managed with lisinopril /hydrochlorothiazide -25 mg daily.  He denies dizziness, lightheadedness, chest pain, or shortness of breath BP Readings from Last 3 Encounters:  10/03/23 102/70  07/24/23 124/60  07/02/23 120/80   Chronic right knee pain -  most recent xray in 06/2023 showed some progression of degenerative arthritis of the right knee primarily involving the medial and lateral joint spaces with associated chondrocalcinosis. About two month ago preformed a steroid injection in the hopes of reliving some of the discomfort . He reports today that he did get good relief for about 5 weeks but that a week ago the pain started to come back.   Review of Systems See HPI   Past Medical History:  Diagnosis Date   Allergy    Basal cell carcinoma    GERD (gastroesophageal reflux disease)    History of kidney cancer    benign; told not cancerous   Hypertension     Social History   Socioeconomic History   Marital status: Married    Spouse name: Not on file   Number of children: Not on file   Years of education: Not on file   Highest education level: Bachelor's degree (e.g., BA, AB, BS)  Occupational History   Not on file  Tobacco Use   Smoking status: Former   Smokeless tobacco: Never   Vaping Use   Vaping status: Never Used  Substance and Sexual Activity   Alcohol use: Not Currently    Comment: "Beer every once in a while"   Drug use: No   Sexual activity: Not Currently  Other Topics Concern   Not on file  Social History Narrative   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   Married    Three children ([place]    Right handed   Social Drivers of Health   Financial Resource Strain: Low Risk  (04/10/2023)   Overall Financial Resource Strain (CARDIA)    Difficulty of Paying Living Expenses: Not hard at all  Food Insecurity: No Food Insecurity (04/10/2023)   Hunger Vital Sign    Worried About Running Out of Food in the Last Year: Never true    Ran Out of Food in the Last Year: Never true  Transportation Needs: No Transportation Needs (04/10/2023)   PRAPARE - Administrator, Civil Service (Medical): No    Lack of Transportation (Non-Medical): No  Physical Activity: Sufficiently Active (04/10/2023)   Exercise Vital Sign    Days of Exercise per Week: 7 days    Minutes of Exercise per Session: 40 min  Stress: No Stress Concern Present (04/10/2023)   [employer] of Occupational Health - Occupational Stress Questionnaire  Feeling of Stress : Not at all  Social Connections: Socially Integrated (04/10/2023)   Social Connection and Isolation Panel [NHANES]    Frequency of Communication with Friends and Family: More than three times a week    Frequency of Social Gatherings with Friends and Family: More than three times a week    Attends Religious Services: More than 4 times per year    Active Member of [employer] or Organizations: Yes    Attends Banker Meetings: More than 4 times per year    Marital Status: Married  Catering manager Violence: Not At Risk (04/10/2023)   Humiliation, Afraid, Rape, and Kick questionnaire    Fear of Current or Ex-Partner: No    Emotionally Abused: No    Physically Abused: No    Sexually Abused: No     Past Surgical History:  Procedure Laterality Date   CATARACT EXTRACTION Bilateral    CRANIOTOMY Bilateral 12/25/2021   Procedure: CRANIOTOMY HEMATOMA EVACUATION SUBDURAL;  Surgeon: Agustina Aldrich, MD;  Location: [facility] OR;  Service: Neurosurgery;  Laterality: Bilateral;   HERNIA REPAIR     x2   KIDNEY SURGERY Right    Had right kidney removed.    TONSILLECTOMY     VASECTOMY      Family History  Problem Relation Age of Onset   Cancer Mother        Breast    Alzheimer's disease Mother    Cancer Father        Lung    Colon cancer Neg Hx     No Known Allergies  Current Outpatient Medications on File Prior to Visit  Medication Sig Dispense Refill   acetaminophen  (TYLENOL ) 500 MG tablet Take 1 tablet (500 mg total) by mouth 2 (two) times daily as needed for mild pain. 30 tablet 0   atorvastatin  (LIPITOR) 20 MG tablet TAKE 1 TABLET BY MOUTH EVERY DAY 90 tablet 0   B Complex Vitamins (VITAMIN B COMPLEX ) TABS Take by mouth daily. 30 tablet 0   butalbital -acetaminophen -caffeine  (FIORICET ) 50-325-40 MG tablet Take 1 tablet by mouth every 6 (six) hours as needed for headache. 10 tablet 0   cetirizine (ZYRTEC) 10 MG tablet Take 10 mg by mouth daily.     Cholecalciferol  25 MCG (1000 UT) tablet Take 1 tablet (1,000 Units total) by mouth daily. 30 tablet 0   diclofenac  Sodium (VOLTAREN ) 1 % GEL Apply 4 g topically 3 (three) times daily. 100 g 0   finasteride  (PROSCAR ) 5 MG tablet Take 1 tablet (5 mg total) by mouth daily. 90 tablet 0   fluorouracil (EFUDEX) 5 % cream Apply topically 2 (two) times daily.     latanoprost  (XALATAN ) 0.005 % ophthalmic solution Place 1 drop into both eyes at bedtime.     lisinopril -hydrochlorothiazide  (ZESTORETIC ) 20-25 MG tablet TAKE 1 TABLET BY MOUTH EVERY DAY 90 tablet 0   meclizine  (ANTIVERT ) 25 MG tablet Take 1 tablet (25 mg total) by mouth 3 (three) times daily as needed. 30 tablet 0   tamsulosin  (FLOMAX ) 0.4 MG CAPS capsule TAKE 1 CAPSULE BY MOUTH TWICE A DAY  180 capsule 0   traZODone  (DESYREL ) 100 MG tablet Take 1 tablet (100 mg total) by mouth at bedtime. 30 tablet 0   vitamin C (ASCORBIC ACID) 500 MG tablet Take 500 mg by mouth daily.     No current facility-administered medications on file prior to visit.    BP 102/70   Pulse 85   Temp 98.5 F (36.9 C) (Oral)  Ht 6\' 6"  (1.981 m)   Wt 249 lb (112.9 kg)   SpO2 95%   BMI 28.77 kg/m       Objective:   Physical Exam Vitals and nursing note reviewed.  Constitutional:      Appearance: Normal appearance. He is obese.  Cardiovascular:     Rate and Rhythm: Normal rate and regular rhythm.     Pulses: Normal pulses.     Heart sounds: Normal heart sounds.  Pulmonary:     Effort: Pulmonary effort is normal.     Breath sounds: Normal breath sounds.  Skin:    General: Skin is warm and dry.  Neurological:     General: No focal deficit present.     Mental Status: He is alert and oriented to person, place, and time.  Psychiatric:        Mood and Affect: Mood normal.        Behavior: Behavior normal.        Thought Content: Thought content normal.        Judgment: Judgment normal.        Assessment & Plan:  1. Diabetes mellitus treated with oral medication (HCC) (Primary)  - POC HgB A1c- 7.0  - metFORMIN  (GLUCOPHAGE -XR) 500 MG 24 hr tablet; Take 1 tablet (500 mg total) by mouth daily with breakfast.  Dispense: 90 tablet; Refill: 1 - Work on reducing snacking  - Follow up in three months   2. Essential hypertension -  well controlled. No change in medication   3. Chronic pain of right knee  - AMB referral to orthopedics

## 2023-10-15 ENCOUNTER — Ambulatory Visit: Admitting: Physician Assistant

## 2023-10-15 ENCOUNTER — Telehealth: Payer: Self-pay

## 2023-10-15 DIAGNOSIS — M1711 Unilateral primary osteoarthritis, right knee: Secondary | ICD-10-CM | POA: Diagnosis not present

## 2023-10-15 MED ORDER — HYALURONAN 88 MG/4ML IX SOSY
88.0000 mg | PREFILLED_SYRINGE | INTRA_ARTICULAR | Status: AC | PRN
  Administered 2023-10-15: 88 mg via INTRA_ARTICULAR

## 2023-10-15 NOTE — Progress Notes (Signed)
 Office Visit Note   Patient: Brandon [name]           Date of Birth: 1946/01/13           MRN: [id number] Visit Date: 10/15/2023              Requested by: Alto Atta, NP [address] [place],  [place] [address] PCP: Alto Atta, NP   Assessment & Plan: Visit Diagnoses:  1. Unilateral primary osteoarthritis, right knee     Plan: Patient is a pleasant 78 year old gentleman who has a history of osteoarthritis and pseudogout of his right knee.  He was getting steroid injections.  It was suggested to him that he might be eligible for a viscosupplementation.  He is not a surgical candidate at this time.  Follow-Up Instructions: Return if symptoms worsen or fail to improve.   Orders:  No orders of the defined types were placed in this encounter.  No orders of the defined types were placed in this encounter.     Procedures: Large Joint Inj: R knee on 10/15/2023 11:24 AM Indications: pain and diagnostic evaluation Details: 22 G 1.5 in needle, anteromedial approach  Arthrogram: No  Medications: 88 mg Hyaluronan 88 MG/4ML Outcome: tolerated well, no immediate complications Procedure, treatment alternatives, risks and benefits explained, specific risks discussed. Consent was given by the patient. Immediately prior to procedure a time out was called to verify the correct patient, procedure, equipment, support staff and site/side marked as required. Patient was prepped and draped in the usual sterile fashion.       Clinical Data: No additional findings.   Subjective: Right knee pain  HPI pleasant 78 year old gentleman comes in today complaining of right knee pain.  He has had steroid injections done elsewhere before but was told he be a good candidate for viscosupplementation  Review of Systems  All other systems reviewed and are negative.    Objective: Vital Signs: There were no vitals taken for this visit.  Physical Exam Constitutional:       Appearance: Normal appearance.  Pulmonary:     Effort: Pulmonary effort is normal.  Skin:    General: Skin is warm and dry.  Neurological:     General: No focal deficit present.     Mental Status: He is alert and oriented to person, place, and time.  Psychiatric:        Mood and Affect: Mood normal.        Behavior: Behavior normal.    Ortho Exam Right knee no erythema compartments are soft and compressible he is neurovascularly intact no evidence of cellulitis he has crepitus with range of motion Specialty Comments:  No specialty comments available.  Imaging: No results found.   PMFS History: Patient Active Problem List   Diagnosis Date Noted   Unilateral primary osteoarthritis, right knee 10/15/2023   Cognitive and neurobehavioral dysfunction following brain injury (HCC) 02/07/2022   Gait difficulty 02/07/2022   Traumatic brain injury with loss of consciousness (HCC) 12/30/2021   SDH (subdural hematoma) (HCC) 12/24/2021   Hyperlipidemia 02/13/2018   BPH (benign prostatic hyperplasia) 01/31/2017   Heart murmur 01/20/2016   HTN (hypertension) 03/09/2012   Past Medical History:  Diagnosis Date   Allergy    Basal cell carcinoma    GERD (gastroesophageal reflux disease)    History of kidney cancer    benign; told not cancerous   Hypertension     Family History  Problem Relation Age of Onset   Cancer  Mother        Breast    Alzheimer's disease Mother    Cancer Father        Lung    Colon cancer Neg Hx     Past Surgical History:  Procedure Laterality Date   CATARACT EXTRACTION Bilateral    CRANIOTOMY Bilateral 12/25/2021   Procedure: CRANIOTOMY HEMATOMA EVACUATION SUBDURAL;  Surgeon: Agustina Aldrich, MD;  Location: [facility] OR;  Service: Neurosurgery;  Laterality: Bilateral;   HERNIA REPAIR     x2   KIDNEY SURGERY Right    Had right kidney removed.    TONSILLECTOMY     VASECTOMY     Social History   Occupational History   Not on file  Tobacco Use   Smoking  status: Former   Smokeless tobacco: Never  Vaping Use   Vaping status: Never Used  Substance and Sexual Activity   Alcohol use: Not Currently    Comment: "Beer every once in a while"   Drug use: No   Sexual activity: Not Currently

## 2023-10-15 NOTE — Telephone Encounter (Signed)
 VOB submitted for Monovisc, right knee

## 2023-10-16 ENCOUNTER — Other Ambulatory Visit: Payer: Self-pay

## 2023-10-16 DIAGNOSIS — M1711 Unilateral primary osteoarthritis, right knee: Secondary | ICD-10-CM

## 2023-10-22 ENCOUNTER — Emergency Department (HOSPITAL_BASED_OUTPATIENT_CLINIC_OR_DEPARTMENT_OTHER): Admitting: Radiology

## 2023-10-22 ENCOUNTER — Emergency Department (HOSPITAL_BASED_OUTPATIENT_CLINIC_OR_DEPARTMENT_OTHER)
Admission: EM | Admit: 2023-10-22 | Discharge: 2023-10-23 | Disposition: A | Discharge status: Home / Self Care | Attending: Emergency Medicine | Admitting: Emergency Medicine

## 2023-10-22 ENCOUNTER — Encounter (HOSPITAL_BASED_OUTPATIENT_CLINIC_OR_DEPARTMENT_OTHER): Payer: Self-pay

## 2023-10-22 ENCOUNTER — Emergency Department (HOSPITAL_BASED_OUTPATIENT_CLINIC_OR_DEPARTMENT_OTHER)

## 2023-10-22 DIAGNOSIS — Z85828 Personal history of other malignant neoplasm of skin: Secondary | ICD-10-CM | POA: Insufficient documentation

## 2023-10-22 DIAGNOSIS — M79601 Pain in right arm: Secondary | ICD-10-CM | POA: Diagnosis not present

## 2023-10-22 DIAGNOSIS — M25511 Pain in right shoulder: Secondary | ICD-10-CM | POA: Diagnosis not present

## 2023-10-22 DIAGNOSIS — S59901A Unspecified injury of right elbow, initial encounter: Secondary | ICD-10-CM | POA: Diagnosis not present

## 2023-10-22 DIAGNOSIS — I672 Cerebral atherosclerosis: Secondary | ICD-10-CM | POA: Diagnosis not present

## 2023-10-22 DIAGNOSIS — S0990XA Unspecified injury of head, initial encounter: Secondary | ICD-10-CM | POA: Insufficient documentation

## 2023-10-22 DIAGNOSIS — I10 Essential (primary) hypertension: Secondary | ICD-10-CM | POA: Insufficient documentation

## 2023-10-22 DIAGNOSIS — M5030 Other cervical disc degeneration, unspecified cervical region: Secondary | ICD-10-CM | POA: Diagnosis not present

## 2023-10-22 DIAGNOSIS — Z85528 Personal history of other malignant neoplasm of kidney: Secondary | ICD-10-CM | POA: Diagnosis not present

## 2023-10-22 DIAGNOSIS — Y92009 Unspecified place in unspecified non-institutional (private) residence as the place of occurrence of the external cause: Secondary | ICD-10-CM

## 2023-10-22 DIAGNOSIS — M19011 Primary osteoarthritis, right shoulder: Secondary | ICD-10-CM | POA: Diagnosis not present

## 2023-10-22 DIAGNOSIS — Z043 Encounter for examination and observation following other accident: Secondary | ICD-10-CM | POA: Diagnosis not present

## 2023-10-22 DIAGNOSIS — Z79899 Other long term (current) drug therapy: Secondary | ICD-10-CM | POA: Diagnosis not present

## 2023-10-22 DIAGNOSIS — W01198A Fall on same level from slipping, tripping and stumbling with subsequent striking against other object, initial encounter: Secondary | ICD-10-CM | POA: Insufficient documentation

## 2023-10-22 NOTE — ED Triage Notes (Signed)
 Pt presents w/ family, for fall tonight at 8pm. Reports he fell back, hitting head on nightstand. Significant history of falls, hx brain bleed x2 years ago from fall. Evaluated by EMS, recommended to come to [facility] by EMS and PCP. Pt CA&Ox4 in triage, PERRLA. Dizziness precipitated fall, resolved now, denies nausea

## 2023-10-22 NOTE — ED Notes (Signed)
 Pt stuck head posteriorly, skin intact, hematoma noted. Pt also c/o R shoulder pain

## 2023-10-22 NOTE — ED Notes (Signed)
 Pt also noted to have hematoma, abrasion to R elbow

## 2023-10-23 ENCOUNTER — Telehealth: Payer: Self-pay | Admitting: *Deleted

## 2023-10-23 NOTE — Transitions of Care (Post Inpatient/ED Visit) (Signed)
 10/23/2023  Name: Brandon [name] MRN: [id number] DOB: [birth year]  Today's TOC FU Call Status: Today's TOC FU Call Status:: Successful TOC FU Call Completed TOC FU Call Complete Date: 10/23/23 Patient's Name and Date of Birth confirmed.  Transition Care Management Follow-up Telephone Call Date of Discharge: 10/22/23 Discharge Facility: [facility] ([facility]) Type of Discharge: Emergency Department Reason for ED Visit: Other: (fall) How have you been since you were released from the hospital?: Better Any questions or concerns?: No  Items Reviewed: Did you receive and understand the discharge instructions provided?: Yes Medications obtained,verified, and reconciled?: Yes (Medications Reviewed) Any new allergies since your discharge?: No Dietary orders reviewed?: Yes Do you have support at home?: Yes People in Home [RPT]: spouse  Medications Reviewed Today: Medications Reviewed Today     Reviewed by Adron Albino, CMA (Certified Medical Assistant) on 10/23/23 at 1148  Med List Status: <None>   Medication Order Taking? Sig Documenting Provider Last Dose Status Informant  acetaminophen  (TYLENOL ) 500 MG tablet [id number] Yes Take 1 tablet (500 mg total) by mouth 2 (two) times daily as needed for mild pain. Sterling Eisenmenger, PA-C Taking Active   atorvastatin  (LIPITOR) 20 MG tablet [id number] Yes TAKE 1 TABLET BY MOUTH EVERY DAY Nafziger, Randel Buss, NP Taking Active   B Complex Vitamins (VITAMIN B COMPLEX ) TABS [id number] Yes Take by mouth daily. Sterling Eisenmenger, PA-C Taking Active   butalbital -acetaminophen -caffeine  (FIORICET ) 50-325-40 MG tablet [id number] Yes Take 1 tablet by mouth every 6 (six) hours as needed for headache. Sterling Eisenmenger, PA-C Taking Active   cetirizine (ZYRTEC) 10 MG tablet [id number] Yes Take 10 mg by mouth daily. [provider] Taking Active Spouse/Significant Other  Cholecalciferol  25 MCG (1000 UT) tablet [id number] Yes Take 1 tablet (1,000 Units  total) by mouth daily. Sterling Eisenmenger, PA-C Taking Active   diclofenac  Sodium (VOLTAREN ) 1 % GEL [id number] Yes Apply 4 g topically 3 (three) times daily. Sterling Eisenmenger, PA-C Taking Active   finasteride  (PROSCAR ) 5 MG tablet [id number] Yes Take 1 tablet (5 mg total) by mouth daily. Nafziger, Randel Buss, NP Taking Active   fluorouracil (EFUDEX) 5 % cream [id number] Yes Apply topically 2 (two) times daily. [provider] Taking Active   latanoprost  (XALATAN ) 0.005 % ophthalmic solution [id number] Yes Place 1 drop into both eyes at bedtime. [provider] Taking Active Spouse/Significant Other  lisinopril -hydrochlorothiazide  (ZESTORETIC ) 20-25 MG tablet [id number] Yes TAKE 1 TABLET BY MOUTH EVERY DAY Nafziger, Randel Buss, NP Taking Active   meclizine  (ANTIVERT ) 25 MG tablet [id number] Yes Take 1 tablet (25 mg total) by mouth 3 (three) times daily as needed. Nafziger, Randel Buss, NP Taking Active   metFORMIN  (GLUCOPHAGE -XR) 500 MG 24 hr tablet [id number] Yes Take 1 tablet (500 mg total) by mouth daily with breakfast. Alto Atta, NP Taking Active   tamsulosin  (FLOMAX ) 0.4 MG CAPS capsule [id number] Yes TAKE 1 CAPSULE BY MOUTH TWICE A DAY Nafziger, Cory, NP Taking Active   traZODone  (DESYREL ) 100 MG tablet [id number] Yes Take 1 tablet (100 mg total) by mouth at bedtime. Sterling Eisenmenger, PA-C Taking Active   vitamin C (ASCORBIC ACID) 500 MG tablet [id number] Yes Take 500 mg by mouth daily. [provider] Taking Active Spouse/Significant Other            Home Care and Equipment/Supplies: Were Home Health Services Ordered?: No Any new equipment or medical supplies ordered?: No  Functional Questionnaire: Do you need assistance with bathing/showering or dressing?: No Do you need assistance with  meal preparation?: No Do you need assistance with eating?: No Do you have difficulty maintaining continence: No Do you need assistance with getting out of bed/getting out of a chair/moving?:  No Do you have difficulty managing or taking your medications?: No  Follow up appointments reviewed: PCP Follow-up appointment confirmed?: Yes Date of PCP follow-up appointment?: 10/24/23 Follow-up Provider: [facility] Follow-up appointment confirmed?: No Do you need transportation to your follow-up appointment?: No Do you understand care options if your condition(s) worsen?: Yes-patient verbalized understanding    SIGNATURE: Nicolina Barrios CMA

## 2023-10-23 NOTE — ED Provider Notes (Signed)
 [place] EMERGENCY DEPARTMENT AT [facility] Provider Note  CSN: [id number] Arrival date & time: 10/22/23 2102  Chief Complaint(s) Fall  HPI Brandon [name] is a 78 y.o. male    The history is provided by the patient.  Fall This is a new problem. The current episode started 3 to 5 hours ago. Episode frequency: once.   Lost his balance while trying to sit and bed. Hit the back of his head on night stand No LOC. No AC  Past Medical History Past Medical History:  Diagnosis Date   Allergy    Basal cell carcinoma    GERD (gastroesophageal reflux disease)    History of kidney cancer    benign; told not cancerous   Hypertension    Patient Active Problem List   Diagnosis Date Noted   Unilateral primary osteoarthritis, right knee 10/15/2023   Cognitive and neurobehavioral dysfunction following brain injury (HCC) 02/07/2022   Gait difficulty 02/07/2022   Traumatic brain injury with loss of consciousness (HCC) 12/30/2021   SDH (subdural hematoma) (HCC) 12/24/2021   Hyperlipidemia 02/13/2018   BPH (benign prostatic hyperplasia) 01/31/2017   Heart murmur 01/20/2016   HTN (hypertension) 03/09/2012   Home Medication(s) Prior to Admission medications   Medication Sig Start Date End Date Taking? Authorizing Provider  acetaminophen  (TYLENOL ) 500 MG tablet Take 1 tablet (500 mg total) by mouth 2 (two) times daily as needed for mild pain. 01/22/22   Angiulli, Everlyn Hockey, PA-C  atorvastatin  (LIPITOR) 20 MG tablet TAKE 1 TABLET BY MOUTH EVERY DAY 08/13/23   Nafziger, Randel Buss, NP  B Complex Vitamins (VITAMIN B COMPLEX ) TABS Take by mouth daily. 01/22/22   Angiulli, Everlyn Hockey, PA-C  butalbital -acetaminophen -caffeine  (FIORICET ) 50-325-40 MG tablet Take 1 tablet by mouth every 6 (six) hours as needed for headache. 01/22/22   Angiulli, Everlyn Hockey, PA-C  cetirizine (ZYRTEC) 10 MG tablet Take 10 mg by mouth daily.    [provider]  Cholecalciferol  25 MCG (1000 UT) tablet Take 1 tablet  (1,000 Units total) by mouth daily. 01/22/22   Angiulli, Daniel J, PA-C  diclofenac  Sodium (VOLTAREN ) 1 % GEL Apply 4 g topically 3 (three) times daily. 01/22/22   Angiulli, Everlyn Hockey, PA-C  finasteride  (PROSCAR ) 5 MG tablet Take 1 tablet (5 mg total) by mouth daily. 01/26/22   Nafziger, Randel Buss, NP  fluorouracil (EFUDEX) 5 % cream Apply topically 2 (two) times daily. 08/01/23   [provider]  latanoprost  (XALATAN ) 0.005 % ophthalmic solution Place 1 drop into both eyes at bedtime. 10/02/21   [provider]  lisinopril -hydrochlorothiazide  (ZESTORETIC ) 20-25 MG tablet TAKE 1 TABLET BY MOUTH EVERY DAY 08/13/23   Nafziger, Randel Buss, NP  meclizine  (ANTIVERT ) 25 MG tablet Take 1 tablet (25 mg total) by mouth 3 (three) times daily as needed. 07/24/23   Nafziger, Randel Buss, NP  metFORMIN  (GLUCOPHAGE -XR) 500 MG 24 hr tablet Take 1 tablet (500 mg total) by mouth daily with breakfast. 10/03/23   Nafziger, Randel Buss, NP  tamsulosin  (FLOMAX ) 0.4 MG CAPS capsule TAKE 1 CAPSULE BY MOUTH TWICE A DAY 08/13/23   Nafziger, Randel Buss, NP  traZODone  (DESYREL ) 100 MG tablet Take 1 tablet (100 mg total) by mouth at bedtime. 01/22/22   Angiulli, Everlyn Hockey, PA-C  vitamin C (ASCORBIC ACID) 500 MG tablet Take 500 mg by mouth daily. 12/20/16   [provider]  Allergies Patient has no known allergies.  Review of Systems Review of Systems As noted in HPI  Physical Exam Vital Signs  I have reviewed the triage vital signs BP (!) 157/87   Pulse 61   Temp 99.1 F (37.3 C) (Oral)   Resp 15   Ht 6' 6 (1.981 m)   Wt 113.4 kg   SpO2 96%   BMI 28.89 kg/m   Physical Exam Constitutional:      General: He is not in acute distress.    Appearance: He is well-developed. He is not diaphoretic.  HENT:     Head: Normocephalic.     Right Ear: External ear normal.     Left Ear: External ear normal.   Eyes:     General: No scleral icterus.       Right eye: No discharge.        Left eye: No discharge.     Conjunctiva/sclera: Conjunctivae normal.     Pupils: Pupils are equal, round, and reactive to light.  Cardiovascular:     Rate and Rhythm: Regular rhythm.     Pulses:          Radial pulses are 2+ on the right side and 2+ on the left side.       Dorsalis pedis pulses are 2+ on the right side and 2+ on the left side.     Heart sounds: Normal heart sounds. No murmur heard.    No friction rub. No gallop.  Pulmonary:     Effort: Pulmonary effort is normal. No respiratory distress.     Breath sounds: Normal breath sounds. No stridor.  Abdominal:     General: There is no distension.     Palpations: Abdomen is soft.     Tenderness: There is no abdominal tenderness.  Musculoskeletal:     Cervical back: Normal range of motion and neck supple. No bony tenderness.     Thoracic back: No bony tenderness.     Lumbar back: No bony tenderness.     Comments: Clavicle stable. Chest stable to AP/Lat compression. Pelvis stable to Lat compression. No obvious extremity deformity. No chest or abdominal wall contusion.  Skin:    General: Skin is warm.  Neurological:     Mental Status: He is alert and oriented to person, place, and time.     GCS: GCS eye subscore is 4. GCS verbal subscore is 5. GCS motor subscore is 6.     Comments: Moving all extremities      ED Results and Treatments Labs (all labs ordered are listed, but only abnormal results are displayed) Labs Reviewed - No data to display                                                                                                                       EKG  EKG Interpretation Date/Time:    Ventricular Rate:    PR Interval:    QRS Duration:    QT Interval:  QTC Calculation:   R Axis:      Text Interpretation:         Radiology DG Elbow Complete Right Result Date: 10/22/2023 CLINICAL DATA:  Brandon [name] and injured the right  elbow. EXAM: RIGHT ELBOW - COMPLETE 3+ VIEW COMPARISON:  None Available. FINDINGS: Four views are provided. There is no evidence of fracture, dislocation, or joint effusion. There is no evidence of arthropathy or other focal bone abnormality. Mild soft tissue swelling is seen medially. IMPRESSION: Soft tissue swelling without evidence of fractures. Electronically Signed   By: Denman Fischer M.D.   On: 10/22/2023 21:57   DG Shoulder Right Result Date: 10/22/2023 CLINICAL DATA:  Status post fall with right shoulder pain EXAM: RIGHT SHOULDER - 3 VIEW COMPARISON:  None Available. FINDINGS: There is no evidence of fracture or dislocation. Mild degenerative changes of the glenohumeral and acromioclavicular joints. Soft tissues are unremarkable. IMPRESSION: No acute fracture or dislocation. Electronically Signed   By: Limin  Xu M.D.   On: 10/22/2023 21:55   CT Cervical Spine Wo Contrast Result Date: 10/22/2023 CLINICAL DATA:  Fall EXAM: CT CERVICAL SPINE WITHOUT CONTRAST TECHNIQUE: Multidetector CT imaging of the cervical spine was performed without intravenous contrast. Multiplanar CT image reconstructions were also generated. RADIATION DOSE REDUCTION: This exam was performed according to the departmental dose-optimization program which includes automated exposure control, adjustment of the mA and/or kV according to patient size and/or use of iterative reconstruction technique. COMPARISON:  04/26/2022 FINDINGS: Alignment: No subluxation. Skull base and vertebrae: No acute fracture. No primary bone lesion or focal pathologic process. Soft tissues and spinal canal: No prevertebral fluid or swelling. No visible canal hematoma. Disc levels: Diffuse degenerative disc disease and facet disease. No disc herniation. Upper chest: Persistent ground-glass density in the right apex measuring up to 11 mm compared to 15 mm previously most compatible with a benign process given the stability or close to 2 years. Other: None  IMPRESSION: Diffuse degenerative disc and facet disease. No acute bony abnormality. Electronically Signed   By: Janeece Mechanic M.D.   On: 10/22/2023 21:51   CT Head Wo Contrast Result Date: 10/22/2023 CLINICAL DATA:  Fall EXAM: CT HEAD WITHOUT CONTRAST TECHNIQUE: Contiguous axial images were obtained from the base of the skull through the vertex without intravenous contrast. RADIATION DOSE REDUCTION: This exam was performed according to the departmental dose-optimization program which includes automated exposure control, adjustment of the mA and/or kV according to patient size and/or use of iterative reconstruction technique. COMPARISON:  04/26/2022 FINDINGS: Brain: Stable chronic low-density subdural fluid collections measuring approximately 6 mm bilaterally, unchanged. There is atrophy and chronic small vessel disease changes. No acute hemorrhage or infarct. No hydrocephalus. Vascular: No hyperdense vessel or unexpected calcification. Skull: No acute calvarial abnormality.  Bilateral burr holes noted. Sinuses/Orbits: No acute findings Other: None IMPRESSION: Atrophy, chronic microvascular disease. No acute intracranial abnormality. Stable small bilateral chronic subdural hematomas. Electronically Signed   By: Janeece Mechanic M.D.   On: 10/22/2023 21:49    Medications Ordered in ED Medications - No data to display Procedures Procedures  (including critical care time) Medical Decision Making / ED Course   Medical Decision Making Amount and/or Complexity of Data Reviewed Radiology: ordered and independent interpretation performed. Decision-making details documented in ED Course.    Mechanical fall DDx considered including traumatic injury from fall. CT head and cervical spine negative for any acute injuries.  Patient does have stable chronic subdural hematomas. X-ray of the right shoulder and right elbow  without acute injuries.  No other injuries noted on exam requiring imaging.     Final  Clinical Impression(s) / ED Diagnoses Final diagnoses:  Fall in home, initial encounter  Minor head injury, initial encounter  Right arm pain   The patient appears reasonably screened and/or stabilized for discharge and I doubt any other medical condition or other [facility] requiring further screening, evaluation, or treatment in the ED at this time. I have discussed the findings, Dx and Tx plan with the patient/family who expressed understanding and agree(s) with the plan. Discharge instructions discussed at length. The patient/family was given strict return precautions who verbalized understanding of the instructions. No further questions at time of discharge.  Disposition: Discharge  Condition: Good  ED Discharge Orders     None       Follow Up: Alto Atta, NP [address] [place] [address] [phone]  Call  to schedule an appointment for close follow up    This chart was dictated using voice recognition software.  Despite best efforts to proofread,  errors can occur which can change the documentation meaning.    Lindle Rhea, MD 10/23/23 [phone]

## 2023-10-24 ENCOUNTER — Ambulatory Visit: Admitting: Adult Health

## 2023-10-24 ENCOUNTER — Encounter: Payer: Self-pay | Admitting: Adult Health

## 2023-10-24 VITALS — BP 130/82 | HR 60 | Temp 97.7°F | Ht 78.0 in | Wt 244.8 lb

## 2023-10-24 DIAGNOSIS — L98499 Non-pressure chronic ulcer of skin of other sites with unspecified severity: Secondary | ICD-10-CM

## 2023-10-24 DIAGNOSIS — S0990XD Unspecified injury of head, subsequent encounter: Secondary | ICD-10-CM | POA: Diagnosis not present

## 2023-10-24 DIAGNOSIS — R2681 Unsteadiness on feet: Secondary | ICD-10-CM | POA: Diagnosis not present

## 2023-10-24 NOTE — Progress Notes (Signed)
 Subjective:    Patient ID: Brandon [name], male    DOB: 05-01-1946, 78 y.o.   MRN: [id number]  HPI 78 year old male who  has a past medical history of Allergy, Basal cell carcinoma, GERD (gastroesophageal reflux disease), History of kidney cancer, and Hypertension.  He presents to the office today for follow-up after being seen in the emergency room 2 days ago for a fall.  He reportedly lost his balance while trying to sit on his bed and hit the back of his head on a nightstand.  He had no LOC.  ER CT of the head and cervical spine negative for any acute injuries.  He does have stable chronic subdural hematomas.  X-ray of the right shoulder and right elbow without acute injuries  He is feeling well overall and has not had any headaches, blurred vision or falls since being in the emergency room.  Apparently he reports that he developed a blister on the bottom of his right great toe a few weeks ago, the blister healed but he continues to have pain with walking     Review of Systems See HPI   Past Medical History:  Diagnosis Date   Allergy    Basal cell carcinoma    GERD (gastroesophageal reflux disease)    History of kidney cancer    benign; told not cancerous   Hypertension     Social History   Socioeconomic History   Marital status: Married    Spouse name: Not on file   Number of children: Not on file   Years of education: Not on file   Highest education level: Bachelor's degree (e.g., BA, AB, BS)  Occupational History   Not on file  Tobacco Use   Smoking status: Former   Smokeless tobacco: Never  Vaping Use   Vaping status: Never Used  Substance and Sexual Activity   Alcohol use: Not Currently    Comment: Beer every once in a while   Drug use: No   Sexual activity: Not Currently  Other Topics Concern   Not on file  Social History Narrative   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   Married    Three children (Two in [place]    Right handed   Social  Drivers of Health   Financial Resource Strain: Low Risk  (04/10/2023)   Overall Financial Resource Strain (CARDIA)    Difficulty of Paying Living Expenses: Not hard at all  Food Insecurity: No Food Insecurity (04/10/2023)   Hunger Vital Sign    Worried About Running Out of Food in the Last Year: Never true    Ran Out of Food in the Last Year: Never true  Transportation Needs: No Transportation Needs (04/10/2023)   PRAPARE - Administrator, Civil Service (Medical): No    Lack of Transportation (Non-Medical): No  Physical Activity: Sufficiently Active (04/10/2023)   Exercise Vital Sign    Days of Exercise per Week: 7 days    Minutes of Exercise per Session: 40 min  Stress: No Stress Concern Present (04/10/2023)   [employer] of Occupational Health - Occupational Stress Questionnaire    Feeling of Stress : Not at all  Social Connections: Socially Integrated (04/10/2023)   Social Connection and Isolation Panel    Frequency of Communication with Friends and Family: More than three times a week    Frequency of Social Gatherings with Friends and Family: More than three times a week  Attends Religious Services: More than 4 times per year    Active Member of Clubs or Organizations: Yes    Attends Banker Meetings: More than 4 times per year    Marital Status: Married  Catering manager Violence: Not At Risk (04/10/2023)   Humiliation, Afraid, Rape, and Kick questionnaire    Fear of Current or Ex-Partner: No    Emotionally Abused: No    Physically Abused: No    Sexually Abused: No    Past Surgical History:  Procedure Laterality Date   CATARACT EXTRACTION Bilateral    CRANIOTOMY Bilateral 12/25/2021   Procedure: CRANIOTOMY HEMATOMA EVACUATION SUBDURAL;  Surgeon: Agustina Aldrich, MD;  Location: [facility] OR;  Service: Neurosurgery;  Laterality: Bilateral;   HERNIA REPAIR     x2   KIDNEY SURGERY Right    Had right kidney removed.    TONSILLECTOMY     VASECTOMY       Family History  Problem Relation Age of Onset   Cancer Mother        Breast    Alzheimer's disease Mother    Cancer Father        Lung    Colon cancer Neg Hx     No Known Allergies  Current Outpatient Medications on File Prior to Visit  Medication Sig Dispense Refill   acetaminophen  (TYLENOL ) 500 MG tablet Take 1 tablet (500 mg total) by mouth 2 (two) times daily as needed for mild pain. 30 tablet 0   atorvastatin  (LIPITOR) 20 MG tablet TAKE 1 TABLET BY MOUTH EVERY DAY 90 tablet 0   B Complex Vitamins (VITAMIN B COMPLEX ) TABS Take by mouth daily. 30 tablet 0   butalbital -acetaminophen -caffeine  (FIORICET ) 50-325-40 MG tablet Take 1 tablet by mouth every 6 (six) hours as needed for headache. 10 tablet 0   cetirizine (ZYRTEC) 10 MG tablet Take 10 mg by mouth daily.     Cholecalciferol  25 MCG (1000 UT) tablet Take 1 tablet (1,000 Units total) by mouth daily. 30 tablet 0   diclofenac  Sodium (VOLTAREN ) 1 % GEL Apply 4 g topically 3 (three) times daily. 100 g 0   finasteride  (PROSCAR ) 5 MG tablet Take 1 tablet (5 mg total) by mouth daily. 90 tablet 0   fluorouracil (EFUDEX) 5 % cream Apply topically 2 (two) times daily.     latanoprost  (XALATAN ) 0.005 % ophthalmic solution Place 1 drop into both eyes at bedtime.     lisinopril -hydrochlorothiazide  (ZESTORETIC ) 20-25 MG tablet TAKE 1 TABLET BY MOUTH EVERY DAY 90 tablet 0   meclizine  (ANTIVERT ) 25 MG tablet Take 1 tablet (25 mg total) by mouth 3 (three) times daily as needed. 30 tablet 0   metFORMIN  (GLUCOPHAGE -XR) 500 MG 24 hr tablet Take 1 tablet (500 mg total) by mouth daily with breakfast. 90 tablet 1   tamsulosin  (FLOMAX ) 0.4 MG CAPS capsule TAKE 1 CAPSULE BY MOUTH TWICE A DAY 180 capsule 0   traZODone  (DESYREL ) 100 MG tablet Take 1 tablet (100 mg total) by mouth at bedtime. 30 tablet 0   vitamin C (ASCORBIC ACID) 500 MG tablet Take 500 mg by mouth daily.     No current facility-administered medications on file prior to visit.     BP 130/82 (BP Location: Left Arm, Cuff Size: Normal)   Pulse 60   Temp 97.7 F (36.5 C) (Oral)   Ht 6' 6 (1.981 m)   Wt 244 lb 12.8 oz (111 kg)   SpO2 98%   BMI 28.29 kg/m  Objective:   Physical Exam Vitals and nursing note reviewed.  Constitutional:      Appearance: Normal appearance.   Musculoskeletal:       Feet:  Feet:     Right foot:     Skin integrity: Callus present.     Comments: Out-toeing noted to right foot when ambulating   Skin:    General: Skin is warm and dry.   Neurological:     General: No focal deficit present.     Mental Status: He is alert and oriented to person, place, and time.     Gait: Gait abnormal (using cane to walk).   Psychiatric:        Mood and Affect: Mood normal.        Behavior: Behavior normal.        Thought Content: Thought content normal.        Judgment: Judgment normal.           Assessment & Plan:  1. Gait instability (Primary) - Reviewed his ER notes, imaging, and labs.  Reviewed findings with the patient and his wife.  Encouraged to always use his cane with ambulation.  Falls precautions reviewed.  Follow-up as needed  2. Minor head injury, subsequent encounter - Resolved  3. Callous ulcer, unspecified ulcer stage (HCC)  - Ambulatory referral to Podiatry   Time spent with patient today was 32 minutes which consisted of chart review, discussing  gait instability, head injury and callous work up, treatment answering questions and documentation.

## 2023-10-29 ENCOUNTER — Encounter: Payer: Self-pay | Admitting: Podiatry

## 2023-10-29 ENCOUNTER — Ambulatory Visit: Admitting: Podiatry

## 2023-10-29 DIAGNOSIS — E11621 Type 2 diabetes mellitus with foot ulcer: Secondary | ICD-10-CM | POA: Diagnosis not present

## 2023-10-29 DIAGNOSIS — M76821 Posterior tibial tendinitis, right leg: Secondary | ICD-10-CM

## 2023-10-29 DIAGNOSIS — R262 Difficulty in walking, not elsewhere classified: Secondary | ICD-10-CM | POA: Diagnosis not present

## 2023-10-29 DIAGNOSIS — L97512 Non-pressure chronic ulcer of other part of right foot with fat layer exposed: Secondary | ICD-10-CM | POA: Diagnosis not present

## 2023-10-29 MED ORDER — MUPIROCIN 2 % EX OINT: TOPICAL_OINTMENT | CUTANEOUS | 1 refills | Status: DC

## 2023-10-29 NOTE — Progress Notes (Unsigned)
 Chief Complaint  Patient presents with   Callouses    Rm20 Painful callous right hallux plantar/ recently dx diabetic/3 months/ pain with pressure and walking/soaks and triple antibiotic ointment for treatment   HPI: 78 y.o. male with some loss of hearing, presents today with concern of a wound on the plantar aspect of the right great toe, which began as a blister approximately 1 week ago.  His wife popped the blister and now there is an opening to the area.  He also notes that his arch has collapsed over the past few months.  He does have some discomfort along the inner portion of the midfoot.  He notes that he has bad knees and gets cortisone injections from his orthopedic surgeon.  He was recently diagnosed with diabetes.  His last A1c 4 weeks ago was 7.0  Past Medical History:  Diagnosis Date   Allergy    Basal cell carcinoma    GERD (gastroesophageal reflux disease)    History of kidney cancer    benign; told not cancerous   Hypertension    Past Surgical History:  Procedure Laterality Date   CATARACT EXTRACTION Bilateral    CRANIOTOMY Bilateral 12/25/2021   Procedure: CRANIOTOMY HEMATOMA EVACUATION SUBDURAL;  Surgeon: Agustina Aldrich, MD;  Location: [facility] OR;  Service: Neurosurgery;  Laterality: Bilateral;   HERNIA REPAIR     x2   KIDNEY SURGERY Right    Had right kidney removed.    TONSILLECTOMY     VASECTOMY     No Known Allergies Smoker?: former smoker   PHYSICAL EXAM: General: The patient is alert and oriented x3 in no acute distress.  Dermatology: Skin is warm, dry and supple bilateral lower extremities. Interspaces are clear of maceration and debris.      Wound 1:  Location: Right plantar hallux        Depth: Full-thickness to subcutaneous tissue        Wound Border: Hyperkeratotic        Wound Base: Granular        Drainage: Minimal serosanguineous drainage       Odor?:  None        Surrounding Tissue: No erythema or edema        Infected?:  No         Necrosis?:  No        Pain?:  Minimal to none        Tunneling: No       Dimensions (cm): 0.5 x 0.4 x 0.3 cm  Vascular: Pedal pulses are palpable  Musculoskeletal Exam: There is pain on palpation along the posterior tibial tendon from the inferior area of the medial malleolus to its insertion into the navicular.  There is a flattened arch on the right foot.  No gaps or nodules are noted within the posterior tibial tendon.  There is some pain with resisted supination of the foot along the posterior tibial tendon.     Latest Ref Rng & Units 10/03/2023    9:13 AM 07/02/2023    7:41 AM 12/26/2022    8:25 AM  Hemoglobin A1C  Hemoglobin-A1c 4.0 - 5.6 % 7.0  7.0  6.5    ASSESSMENT / PLAN OF CARE: 1. Posterior tibial tendinitis, right   2. Difficulty walking   3. Type 2 diabetes mellitus with foot ulcer (CODE) (HCC)   4. Chronic ulcer of right great toe with fat layer exposed (HCC)      Meds ordered this  encounter  Medications   mupirocin ointment (BACTROBAN) 2 %    Sig: Apply to toe wound and cover with small piece of gauze/bandaid.  Change once daily    Dispense:  22 g    Refill:  1   The ulceration was sharply debrided of hyperkeratotic and devitalized soft tissue with sterile #312 blade to the level of subcutaneous tissue.  Hemostasis obtained.  Antibiotic ointment and DSD applied.  Reviewed off-loading with patient.  Reviewed daily dressing changes with patient.  Prescription for mupirocin ointment 2% sent to his pharmacy to apply once daily to the wound followed by a gauze pad.  He may put a Band-Aid over the gauze pad or just use gauze to wrap around the toe.  Try to stay off the foot as much as possible until the area heals.  He was fitted for a prefabricated AFO/speed brace to the right foot today.  Proof of delivery and ABN reviewed and signed by patient via motion MD.  Will reevaluate of the midfoot next visit with x-rays to the area since there is some midfoot collapse  noted.  Discussed risks / concerns regarding ulcer with patient and possible sequelae if left untreated.  Stressed importance of infection prevention at home. Short-term goals are: prevent infection, off-load ulcer, heal ulcer Long-term goals are:  prevent recurrence, prevent amputation.   At the end of the appointment he did request nail trimmed today.  It will be rescheduled for his next visit.  Return in about 3 weeks (around 11/19/2023) for recheck R hallux wound, nail trim, eval. PTTD right foot (30 minute spot please).   Joe Murders, DPM, FACFAS [facility]     [address] [place], [place] [address]                Office [phone]  Fax [phone]

## 2023-10-31 ENCOUNTER — Encounter: Payer: Self-pay | Admitting: Podiatry

## 2023-10-31 DIAGNOSIS — L97511 Non-pressure chronic ulcer of other part of right foot limited to breakdown of skin: Secondary | ICD-10-CM | POA: Insufficient documentation

## 2023-10-31 DIAGNOSIS — M76821 Posterior tibial tendinitis, right leg: Secondary | ICD-10-CM | POA: Insufficient documentation

## 2023-10-31 DIAGNOSIS — E11621 Type 2 diabetes mellitus with foot ulcer: Secondary | ICD-10-CM | POA: Insufficient documentation

## 2023-11-25 ENCOUNTER — Ambulatory Visit: Admitting: Podiatry

## 2023-11-25 ENCOUNTER — Ambulatory Visit (INDEPENDENT_AMBULATORY_CARE_PROVIDER_SITE_OTHER)

## 2023-11-25 DIAGNOSIS — M79675 Pain in left toe(s): Secondary | ICD-10-CM

## 2023-11-25 DIAGNOSIS — M76821 Posterior tibial tendinitis, right leg: Secondary | ICD-10-CM

## 2023-11-25 DIAGNOSIS — B351 Tinea unguium: Secondary | ICD-10-CM

## 2023-11-25 DIAGNOSIS — L97511 Non-pressure chronic ulcer of other part of right foot limited to breakdown of skin: Secondary | ICD-10-CM | POA: Diagnosis not present

## 2023-11-25 DIAGNOSIS — E11621 Type 2 diabetes mellitus with foot ulcer: Secondary | ICD-10-CM | POA: Diagnosis not present

## 2023-11-25 DIAGNOSIS — M79674 Pain in right toe(s): Secondary | ICD-10-CM

## 2023-11-25 MED ORDER — CICLOPIROX 8 % EX SOLN
Freq: Every day | CUTANEOUS | 11 refills | Status: DC
Start: 2023-11-25 — End: 2023-12-27

## 2023-11-25 NOTE — Progress Notes (Signed)
 Chief Complaint  Patient presents with   Posterior tibial tendinitis, right    Pt stated that he is doing okay has some minor discomfort, he would like his nails trimmed today as well   HPI: 78 y.o. male presents today for recheck of plantar right hallux ulceration.  There is a Band-Aid covering the wound today.  They have been applying the antibiotic ointment to the area.  They are unsure if it is improving.  His wife states that he often bangs his toes and there are several toes with cracked nails and dried blood present.  He is also here for follow-up of right posterior tibial tendinitis.  He has been wearing his brace.  He is not sure if he is doing any better.  Plan for x-ray today.  He is also here for debridement of his toenails.  He notes pain with the toenails in shoe gear.  His wife would like him to start treating the thickness and discoloration of the toenails.  Past Medical History:  Diagnosis Date   Allergy    Basal cell carcinoma    GERD (gastroesophageal reflux disease)    History of kidney cancer    benign; told not cancerous   Hypertension    Past Surgical History:  Procedure Laterality Date   CATARACT EXTRACTION Bilateral    CRANIOTOMY Bilateral 12/25/2021   Procedure: CRANIOTOMY HEMATOMA EVACUATION SUBDURAL;  Surgeon: Louis Shove, MD;  Location: [facility] OR;  Service: Neurosurgery;  Laterality: Bilateral;   HERNIA REPAIR     x2   KIDNEY SURGERY Right    Had right kidney removed.    TONSILLECTOMY     VASECTOMY     No Known Allergies   Physical Exam: Palpable pedal pulses.  No edema is appreciated.  There is complete collapse of the right midfoot which is not manually reducible with manipulation.  There is no pain on palpation of the distal posterior tibial tendon but there is some discomfort of the posterior tibial tendon as it courses around and superior to the medial malleolus.  No gaps or nodules are noted within the tendon.  The nails x 10 are 3 mm thick, with  subungual debris, distal onycholysis, pain with compression and elongation.  There are several lesser toenails that are cracked with dried blood present but they are stable.  The wound on the plantar aspect of the right hallux IPJ is partial-thickness depth today, measuring 0.1 x 0.2 x 0.1 cm, with granular base, minimal hyperkeratosis along the rim.  Without erythema or edema.  Radiographic Exam (right foot, 3 weightbearing views, 11/25/2023):  Normal osseous mineralization.  Decreased calcaneal inclination angle and severely increased talar declination angle on lateral view.  Joint space narrowing throughout the midtarsal and tarsometatarsal joints.  No fracture seen  Assessment/Plan of Care: 1. Posterior tibial tendinitis, right   2. Pain due to onychomycosis of toenails of both feet   3. Type 2 diabetes mellitus with foot ulcer (CODE) (HCC)   4. Chronic ulcer of right great toe, limited to breakdown of skin (HCC)      Meds ordered this encounter  Medications   ciclopirox  (PENLAC ) 8 % solution    Sig: Apply topically at bedtime. Apply thin layer over nail. Apply daily over previous coat. Remove weekly with polish remover.    Dispense:  6.6 mL    Refill:  11   DG FOOT COMPLETE RIGHT AMB REFERRAL TO PHYSICAL THERAPY  The mycotic nails were debrided x 10 using sterile nail  nippers and a power debriding bur to decrease girth and length.  Will follow-up in approximately 3 months for next appointment.  Reviewed clinical and radiographic findings with the patient.  Recommend referral to physical therapy to increase posterior tibial tendon strength on the right and decrease pain.  He may need to proceed to a Richie brace or custom orthotic in the future, or even an [place]  brace.  Will see how much improvement physical therapy provides.  Prescription sent in for ciclopirox  lacquer.  They will apply a thin coat to the toenails once daily and then remove the buildup of the lacquer in 1 week.  They  will then resume daily application and continue this for 10 months to 12 months.  The ulceration on the plantar aspect of the right hallux IPJ was shaved with sterile #312 blade to the level of dermis.  A small amount of bleeding was achieved.  Hemostasis obtained.  Antibiotic ointment and a dry sterile dressing was applied.  They will continue to changes daily.  Encouraged them not to use Band-Aids and to at least have the primary covering be a piece of gauze if they are going to use a Band-Aid.  This should be healed in 1 week.  If there is no more drainage at that time, they can discontinue all instructions.  Awanda CHARM Imperial, DPM, FACFAS [facility]     [address] [place], [place] [address]                Office [phone]  Fax [phone]

## 2023-12-02 NOTE — Progress Notes (Signed)
   12/02/2023  Patient ID: Brandon [name], male   DOB: [birth year], 78 y.o.   MRN: [id number]  Pharmacy Quality Measure Review  This patient is appearing on a report for being at risk of failing the adherence measure for diabetes medications this calendar year.   Medication: Metformin  Last fill date: 11/11/23 for 90 day supply  Insurance report was not up to date. No action needed at this time.   [name], PharmD Clinical Pharmacist [phone]

## 2023-12-03 NOTE — Therapy (Signed)
 OUTPATIENT PHYSICAL THERAPY LOWER EXTREMITY EVALUATION   Patient Name: Brandon [name] MRN: [id number] DOB:[birth year], 78 y.o., male Today's Date: 12/04/2023  END OF SESSION:  PT End of Session - 12/04/23 1040     Visit Number 1    Number of Visits 7    Date for PT Re-Evaluation 01/15/24    Authorization Type [employer] medicare    Authorization Time Period auth tbd    Progress Note Due on Visit 10    PT Start Time 1041    PT Stop Time 1130    PT Time Calculation (min) 49 min          Past Medical History:  Diagnosis Date   Allergy    Basal cell carcinoma    GERD (gastroesophageal reflux disease)    History of kidney cancer    benign; told not cancerous   Hypertension    Past Surgical History:  Procedure Laterality Date   CATARACT EXTRACTION Bilateral    CRANIOTOMY Bilateral 12/25/2021   Procedure: CRANIOTOMY HEMATOMA EVACUATION SUBDURAL;  Surgeon: Louis Shove, MD;  Location: [facility] OR;  Service: Neurosurgery;  Laterality: Bilateral;   HERNIA REPAIR     x2   KIDNEY SURGERY Right    Had right kidney removed.    TONSILLECTOMY     VASECTOMY     Patient Active Problem List   Diagnosis Date Noted   Chronic ulcer of right great toe, limited to breakdown of skin (HCC) 10/31/2023   Type 2 diabetes mellitus with foot ulcer (CODE) (HCC) 10/31/2023   Posterior tibial tendinitis, right 10/31/2023   Unilateral primary osteoarthritis, right knee 10/15/2023   Cognitive and neurobehavioral dysfunction following brain injury (HCC) 02/07/2022   Gait difficulty 02/07/2022   Traumatic brain injury with loss of consciousness (HCC) 12/30/2021   SDH (subdural hematoma) (HCC) 12/24/2021   Hyperlipidemia 02/13/2018   BPH (benign prostatic hyperplasia) 01/31/2017   Heart murmur 01/20/2016   HTN (hypertension) 03/09/2012    PCP: Merna Huxley, NP  REFERRING PROVIDER: Loel Awanda BIRCH, DPM  REFERRING DIAG: [phone] (ICD-10-CM) - Posterior tibial tendinitis, right  THERAPY DIAG:   Difficulty in walking, not elsewhere classified  Muscle weakness (generalized)  Rationale for Evaluation and Treatment: Rehabilitation  ONSET DATE: last few months   SUBJECTIVE:   SUBJECTIVE STATEMENT: Accompanied by spouse with pt consent. She does assist throughout with history as well. Arrives w/ ankle brace, knee band, and SPC. Notes over past few months R foot has started turning out more and arch has collapsed. Did have a wound on hallux which wife states has mostly healed at this point, today is obscured by gauze/bandage. Denies any N/T, some swelling which he states is chronic. Denies any significant issues with pain but does feel like his foot positioning is affecting his balance.  PERTINENT HISTORY: hx of basal cell carcinoma, HTN, GERD, hx craniotomy, brain injury, DM2 w/ foot ulcer, post tib tendinitis  PAIN:  Denies any pain in foot/ankle  PRECAUTIONS: fall risk, foot wound (R hallux IPJ, healing well per pt spouse)  RED FLAGS: None   WEIGHT BEARING RESTRICTIONS: No  FALLS:  Has patient fallen in last 6 months? Yes. Number of falls 2-3 which they attribute to vertigo and ongoing balance issues  LIVING ENVIRONMENT: Using cane, lives w/ spouse 1 story home, 1 STE  OCCUPATION: retired - enjoys reading, playing bridge, hanging out with family  PLOF: Independent  PATIENT GOALS: work on IT trainer   NEXT MD VISIT: TBD  OBJECTIVE:  Note: Objective measures were completed at Evaluation unless otherwise noted.  DIAGNOSTIC FINDINGS:  R foot XR 11/25/23 per podiatry note: Normal osseous mineralization.  Decreased calcaneal inclination angle and severely increased talar declination angle on lateral view.  Joint space narrowing throughout the midtarsal and tarsometatarsal joints.  No fracture seen   PATIENT SURVEYS:  LEFS: 57/80   COGNITION: Overall cognitive status: grossly WFL although full assessment limited by  [facility]      EDEMA/INSPECTION:  Mild swelling noted about ankle joint. No significant erythema present. Brace is initially donned inverted with some bunching about medial aspect   POSTURE: noted reduction in R arch height, tendency to hold in ER with limited ability to correct   LOWER EXTREMITY ROM:  ROM Right eval Left eval  Knee flexion    Knee extension    Ankle dorsiflexion 8 deg 10 deg  Ankle plantarflexion    Ankle inversion 15 deg  18 deg  Ankle eversion 30 deg 20 deg   (Blank rows = not tested) (Key: WFL = within functional limits not formally assessed, * = concordant pain, s = stiffness/stretching sensation, NT = not tested) Comments:   LOWER EXTREMITY MMT:  MMT Right eval Left eval  Knee flexion    Knee extension    Ankle dorsiflexion 4- 4+  Ankle plantarflexion    Ankle inversion 4- 5  Ankle eversion 4+ 5   (Blank rows = not tested) (Key: WFL = within functional limits not formally assessed, * = concordant pain, s = stiffness/stretching sensation, NT = not tested) Comment:    FUNCTIONAL TESTS:  TUG: 13sec no AD mild instability   GAIT: Distance walked: within clinic Assistive device utilized: Single point cane Level of assistance: Modified independence Comments: R foot ER, increased trunk sway, limited truncal rotation                                                                                                                                 TREATMENT DATE:  [facility] Adult PT Treatment:                                                DATE: 12/04/23 Therapeutic Exercise: HEP practice + education/handout (encouraged toe scrunches w/o towel)   Self Care: Assist with donning brace w/ concurrent education on appropriate fit and sequencing, monitoring skin integrity, rationale for interventions, relevant anatomy/physiology as pertains to mechanics and PT POC   PATIENT EDUCATION:  Education details: Pt education on PT impairments, prognosis, and POC.  Informed consent. Rationale for interventions, safe/appropriate HEP performance Person educated: Patient and spouse Education method: Explanation, Demonstration, Tactile cues, Verbal cues Education comprehension: verbalized understanding, returned demonstration, verbal cues required, tactile cues required, and needs further education    HOME EXERCISE PROGRAM: Access Code: [email or web address] URL: [email or web address] Date: 12/04/2023  Prepared by: Alm Jenny  Exercises - Ankle Inversion Eversion Towel Slide  - 2-3 x daily - 1 sets - 8-12 reps - Towel Scrunches  - 2-3 x daily - 1 sets - 8-12 reps  ASSESSMENT:  CLINICAL IMPRESSION: Patient is a pleasant 78 y.o. gentleman who was seen today for physical therapy evaluation and treatment for posterior tibialis issues. He denies any significant pain, but they endorse gradual increase in instability and altered mechanics over past few months. Extensive fall history but reports increased difficulty with balance since onset. On exam he demonstrates reduced ankle ROM/strength on affected side and altered mechanics w/ gait/transfers. No red flags on exam today, education on improving fit with brace and monitoring skin integrity. No adverse events. Recommend trial of skilled PT to address aforementioned deficits with aim of improving functional tolerance and reducing pain with typical activities. Pt departs today's session in no acute distress, all voiced concerns/questions addressed appropriately from PT perspective.     OBJECTIVE IMPAIRMENTS: Abnormal gait, decreased activity tolerance, decreased balance, decreased endurance, decreased mobility, difficulty walking, decreased ROM, decreased strength, impaired perceived functional ability, improper body mechanics, and postural dysfunction.   ACTIVITY LIMITATIONS: carrying, lifting, standing, stairs, transfers, and locomotion level  PARTICIPATION LIMITATIONS: community activity  PERSONAL FACTORS:  Age, Time since onset of injury/illness/exacerbation, and 3+ comorbidities:  hx of basal cell carcinoma, HTN, GERD, hx craniotomy, brain injury, DM2 w/ foot ulcer, post tib tendinitis are also affecting patient's functional outcome.   REHAB POTENTIAL: Fair given comorbidities and chronicity  CLINICAL DECISION MAKING: Evolving/moderate complexity  EVALUATION COMPLEXITY: Moderate   GOALS:   SHORT TERM GOALS: Target date: 01/01/2024  Pt will demonstrate appropriate understanding and performance of initially prescribed HEP in order to facilitate improved independence with management of symptoms.  Baseline: HEP established  Goal status: INITIAL   2. Pt will report at least 25% improvement in overall pain levels over past week in order to facilitate improved tolerance to typical daily activities.   Baseline: see ROM chart above  Goal status: INITIAL    LONG TERM GOALS: Target date: 01/15/2024  Pt will be able to perform TUG in less than or equal to 11 sec in order to indicate reduced risk of falling (cutoff score for fall risk 13.5 sec in community dwelling older adults per [facility] et al, 2000)  Baseline: 13sec no AD, mild instability  Goal status: INITIAL    2.  Pt will demonstrate at least 4+/5 ankle MMT in tested groups for improved functional strength. Baseline: see MMT chart above Goal status: INITIAL  3.  Pt will demonstrate appropriate performance of final prescribed HEP in order to facilitate improved self-management of symptoms post-discharge.   Baseline: initial HEP prescribed  Goal status: INITIAL    4.  Pt will demonstrate grossly symmetrical ankle inversion/DF AROM in order to facilitate improved functional mobility and walking mechanics. Baseline: see ROM chart above Goal status: INITIAL   5. Pt will improve score at least 65/80 on LEFS in order to indicate improved perception of function d/t LE.   Baseline: 57/80  Goal status: INITIAL   PLAN:  PT FREQUENCY:  1x/week  PT DURATION: 6 weeks  PLANNED INTERVENTIONS: 97164- PT Re-evaluation, 97750- Physical Performance Testing, 97110-Therapeutic exercises, 97530- Therapeutic activity, [id number]- Neuromuscular re-education, 97535- Self Care, 02859- Manual therapy, [id number]- Gait training, [phone] (1-2 muscles), 20561 (3+ muscles)- Dry Needling, Patient/Family education, Balance training, Stair training, Taping, Joint mobilization, Cryotherapy, and Moist heat  PLAN FOR NEXT SESSION: Review/update HEP PRN.  Work on [employer] exercises as appropriate with emphasis on foot intrinsic/extrinsic activation, arch stability, post tib activation. Symptom modification strategies as indicated/appropriate.    Alm DELENA Jenny PT, DPT 12/04/2023 1:47 PM   [employer] shara:  Referring diagnosis? F23.178 (ICD-10-CM) - Posterior tibial tendinitis, right   Treatment diagnosis? (if different than referring diagnosis) Difficulty in walking, not elsewhere classified   Muscle weakness (generalized) What was this (referring dx) caused by? []  Surgery []  Fall [x]  Ongoing issue []  Arthritis []  Other: ____________  Laterality: [x]  Rt []  Lt []  Both  Check all possible CPT codes:  *CHOOSE 10 OR LESS*    See Planned Interventions listed in the Plan section of the Evaluation.

## 2023-12-04 ENCOUNTER — Other Ambulatory Visit: Payer: Self-pay

## 2023-12-04 ENCOUNTER — Encounter: Payer: Self-pay | Admitting: Physical Therapy

## 2023-12-04 ENCOUNTER — Ambulatory Visit: Attending: Podiatry | Admitting: Physical Therapy

## 2023-12-04 DIAGNOSIS — R262 Difficulty in walking, not elsewhere classified: Secondary | ICD-10-CM | POA: Diagnosis not present

## 2023-12-04 DIAGNOSIS — M6281 Muscle weakness (generalized): Secondary | ICD-10-CM | POA: Diagnosis not present

## 2023-12-04 DIAGNOSIS — M76821 Posterior tibial tendinitis, right leg: Secondary | ICD-10-CM | POA: Insufficient documentation

## 2023-12-11 ENCOUNTER — Ambulatory Visit

## 2023-12-11 DIAGNOSIS — R262 Difficulty in walking, not elsewhere classified: Secondary | ICD-10-CM | POA: Diagnosis not present

## 2023-12-11 DIAGNOSIS — M6281 Muscle weakness (generalized): Secondary | ICD-10-CM

## 2023-12-11 DIAGNOSIS — M76821 Posterior tibial tendinitis, right leg: Secondary | ICD-10-CM | POA: Diagnosis not present

## 2023-12-11 NOTE — Therapy (Signed)
 OUTPATIENT PHYSICAL THERAPY TREATMENT NOTE   Patient Name: Brandon [name] MRN: [id number] DOB:1946/04/21, 78 y.o., male Today's Date: 12/11/2023  END OF SESSION:  PT End of Session - 12/11/23 1117     Visit Number 2    Number of Visits 7    Date for PT Re-Evaluation 01/15/24    Authorization Type [employer]    Authorization Time Period 6 visits approved    Authorization - Visit Number 1    Authorization - Number of Visits 6    Progress Note Due on Visit 10    PT Start Time 1130    PT Stop Time 1208    PT Time Calculation (min) 38 min    Activity Tolerance Patient tolerated treatment well    Behavior During Therapy WFL for tasks assessed/performed         Past Medical History:  Diagnosis Date   Allergy    Basal cell carcinoma    GERD (gastroesophageal reflux disease)    History of kidney cancer    benign; told not cancerous   Hypertension    Past Surgical History:  Procedure Laterality Date   CATARACT EXTRACTION Bilateral    CRANIOTOMY Bilateral 12/25/2021   Procedure: CRANIOTOMY HEMATOMA EVACUATION SUBDURAL;  Surgeon: Louis Shove, MD;  Location: [facility] OR;  Service: Neurosurgery;  Laterality: Bilateral;   HERNIA REPAIR     x2   KIDNEY SURGERY Right    Had right kidney removed.    TONSILLECTOMY     VASECTOMY     Patient Active Problem List   Diagnosis Date Noted   Chronic ulcer of right great toe, limited to breakdown of skin (HCC) 10/31/2023   Type 2 diabetes mellitus with foot ulcer (CODE) (HCC) 10/31/2023   Posterior tibial tendinitis, right 10/31/2023   Unilateral primary osteoarthritis, right knee 10/15/2023   Cognitive and neurobehavioral dysfunction following brain injury (HCC) 02/07/2022   Gait difficulty 02/07/2022   Traumatic brain injury with loss of consciousness (HCC) 12/30/2021   SDH (subdural hematoma) (HCC) 12/24/2021   Hyperlipidemia 02/13/2018   BPH (benign prostatic hyperplasia) 01/31/2017   Heart murmur 01/20/2016   HTN (hypertension)  03/09/2012    PCP: Merna Huxley, NP  REFERRING PROVIDER: Loel Awanda BIRCH, DPM  REFERRING DIAG: [phone] (ICD-10-CM) - Posterior tibial tendinitis, right  THERAPY DIAG:  Difficulty in walking, not elsewhere classified  Muscle weakness (generalized)  Rationale for Evaluation and Treatment: Rehabilitation  ONSET DATE: last few months   SUBJECTIVE:   SUBJECTIVE STATEMENT: Accompanied by spouse with patient consent, she aids with subjective due to patient being [facility]. Patient reports some Rt knee pain today, no new falls since last session.   EVAL: Accompanied by spouse with pt consent. She does assist throughout with history as well. Arrives w/ ankle brace, knee band, and SPC. Notes over past few months R foot has started turning out more and arch has collapsed. Did have a wound on hallux which wife states has mostly healed at this point, today is obscured by gauze/bandage. Denies any N/T, some swelling which he states is chronic. Denies any significant issues with pain but does feel like his foot positioning is affecting his balance.  PERTINENT HISTORY: hx of basal cell carcinoma, HTN, GERD, hx craniotomy, brain injury, DM2 w/ foot ulcer, post tib tendinitis  PAIN:  Denies any pain in foot/ankle  PRECAUTIONS: fall risk, foot wound (R hallux IPJ, healing well per pt spouse)  RED FLAGS: None   WEIGHT BEARING RESTRICTIONS: No  FALLS:  Has patient fallen in last 6 months? Yes. Number of falls 2-3 which they attribute to vertigo and ongoing balance issues  LIVING ENVIRONMENT: Using cane, lives w/ spouse 1 story home, 1 STE  OCCUPATION: retired - enjoys reading, playing bridge, hanging out with family  PLOF: Independent  PATIENT GOALS: work on IT trainer   NEXT MD VISIT: TBD  OBJECTIVE:  Note: Objective measures were completed at Evaluation unless otherwise noted.  DIAGNOSTIC FINDINGS:  R foot XR 11/25/23 per podiatry note: Normal osseous  mineralization.  Decreased calcaneal inclination angle and severely increased talar declination angle on lateral view.  Joint space narrowing throughout the midtarsal and tarsometatarsal joints.  No fracture seen   PATIENT SURVEYS:  LEFS: 57/80   COGNITION: Overall cognitive status: grossly WFL although full assessment limited by [facility]     EDEMA/INSPECTION:  Mild swelling noted about ankle joint. No significant erythema present. Brace is initially donned inverted with some bunching about medial aspect   POSTURE: noted reduction in R arch height, tendency to hold in ER with limited ability to correct   LOWER EXTREMITY ROM:  ROM Right eval Left eval  Knee flexion    Knee extension    Ankle dorsiflexion 8 deg 10 deg  Ankle plantarflexion    Ankle inversion 15 deg  18 deg  Ankle eversion 30 deg 20 deg   (Blank rows = not tested) (Key: WFL = within functional limits not formally assessed, * = concordant pain, s = stiffness/stretching sensation, NT = not tested) Comments:   LOWER EXTREMITY MMT:  MMT Right eval Left eval  Knee flexion    Knee extension    Ankle dorsiflexion 4- 4+  Ankle plantarflexion    Ankle inversion 4- 5  Ankle eversion 4+ 5   (Blank rows = not tested) (Key: WFL = within functional limits not formally assessed, * = concordant pain, s = stiffness/stretching sensation, NT = not tested) Comment:    FUNCTIONAL TESTS:  TUG: 13sec no AD mild instability   GAIT: Distance walked: within clinic Assistive device utilized: Single point cane Level of assistance: Modified independence Comments: R foot ER, increased trunk sway, limited truncal rotation                                                                                                                                TREATMENT DATE:  [facility] Adult PT Treatment:                                                DATE: 12/11/23 Therapeutic Exercise: Nustep level 5 x 5 mins while gathering subjective info  and planning session with patient Neuromuscular re-ed: Seated heel raises 2x10 Seated toe raises 2x10 Seated heel raises on 2 step with 5# KB above knee 2x10 BIL Seated inv/ev towel  slides x20 ea Seated towel scrunches 2x1' BIL Toe yoga x1' Standing alternating cone taps with UE support x30 Therapeutic Activity: Standing heel raises x10 with UE support STS with light UE push 2x10   [facility] Adult PT Treatment:                                                DATE: 12/04/23 Therapeutic Exercise: HEP practice + education/handout (encouraged toe scrunches w/o towel)   Self Care: Assist with donning brace w/ concurrent education on appropriate fit and sequencing, monitoring skin integrity, rationale for interventions, relevant anatomy/physiology as pertains to mechanics and PT POC   PATIENT EDUCATION:  Education details: Pt education on PT impairments, prognosis, and POC. Informed consent. Rationale for interventions, safe/appropriate HEP performance Person educated: Patient and spouse Education method: Explanation, Demonstration, Tactile cues, Verbal cues Education comprehension: verbalized understanding, returned demonstration, verbal cues required, tactile cues required, and needs further education    HOME EXERCISE PROGRAM: Access Code: [email or web address] URL: [email or web address] Date: 12/04/2023 Prepared by: Alm Jenny  Exercises - Ankle Inversion Eversion Towel Slide  - 2-3 x daily - 1 sets - 8-12 reps - Towel Scrunches  - 2-3 x daily - 1 sets - 8-12 reps  ASSESSMENT:  CLINICAL IMPRESSION: Patient presents to first follow up PT session reporting some Rt knee pain, no new falls, overall doing well. Session today focused on foot intrinsic strengthening, balance tasks, and distal LE strengthening. He tolerates all exercises well, needs some cues for form throughout. Patient continues to benefit from skilled PT services and should be progressed as able to improve functional  independence.   EVAL: Patient is a pleasant 78 y.o. gentleman who was seen today for physical therapy evaluation and treatment for posterior tibialis issues. He denies any significant pain, but they endorse gradual increase in instability and altered mechanics over past few months. Extensive fall history but reports increased difficulty with balance since onset. On exam he demonstrates reduced ankle ROM/strength on affected side and altered mechanics w/ gait/transfers. No red flags on exam today, education on improving fit with brace and monitoring skin integrity. No adverse events. Recommend trial of skilled PT to address aforementioned deficits with aim of improving functional tolerance and reducing pain with typical activities. Pt departs today's session in no acute distress, all voiced concerns/questions addressed appropriately from PT perspective.     OBJECTIVE IMPAIRMENTS: Abnormal gait, decreased activity tolerance, decreased balance, decreased endurance, decreased mobility, difficulty walking, decreased ROM, decreased strength, impaired perceived functional ability, improper body mechanics, and postural dysfunction.   ACTIVITY LIMITATIONS: carrying, lifting, standing, stairs, transfers, and locomotion level  PARTICIPATION LIMITATIONS: community activity  PERSONAL FACTORS: Age, Time since onset of injury/illness/exacerbation, and 3+ comorbidities:  hx of basal cell carcinoma, HTN, GERD, hx craniotomy, brain injury, DM2 w/ foot ulcer, post tib tendinitis are also affecting patient's functional outcome.   REHAB POTENTIAL: Fair given comorbidities and chronicity  CLINICAL DECISION MAKING: Evolving/moderate complexity  EVALUATION COMPLEXITY: Moderate   GOALS:   SHORT TERM GOALS: Target date: 01/01/2024  Pt will demonstrate appropriate understanding and performance of initially prescribed HEP in order to facilitate improved independence with management of symptoms.  Baseline: HEP  established  Goal status: INITIAL   2. Pt will report at least 25% improvement in overall pain levels over past week in order to facilitate improved tolerance to typical daily activities.  Baseline: see ROM chart above  Goal status: INITIAL    LONG TERM GOALS: Target date: 01/15/2024  Pt will be able to perform TUG in less than or equal to 11 sec in order to indicate reduced risk of falling (cutoff score for fall risk 13.5 sec in community dwelling older adults per [facility] et al, 2000)  Baseline: 13sec no AD, mild instability  Goal status: INITIAL    2.  Pt will demonstrate at least 4+/5 ankle MMT in tested groups for improved functional strength. Baseline: see MMT chart above Goal status: INITIAL  3.  Pt will demonstrate appropriate performance of final prescribed HEP in order to facilitate improved self-management of symptoms post-discharge.   Baseline: initial HEP prescribed  Goal status: INITIAL    4.  Pt will demonstrate grossly symmetrical ankle inversion/DF AROM in order to facilitate improved functional mobility and walking mechanics. Baseline: see ROM chart above Goal status: INITIAL   5. Pt will improve score at least 65/80 on LEFS in order to indicate improved perception of function d/t LE.   Baseline: 57/80  Goal status: INITIAL   PLAN:  PT FREQUENCY: 1x/week  PT DURATION: 6 weeks  PLANNED INTERVENTIONS: 97164- PT Re-evaluation, 97750- Physical Performance Testing, 97110-Therapeutic exercises, 97530- Therapeutic activity, [id number]- Neuromuscular re-education, 97535- Self Care, 02859- Manual therapy, [id number]- Gait training, [phone] (1-2 muscles), [id number] (3+ muscles)- Dry Needling, Patient/Family education, Balance training, Stair training, Taping, Joint mobilization, Cryotherapy, and Moist heat  PLAN FOR NEXT SESSION: Review/update HEP PRN. Work on [employer] exercises as appropriate with emphasis on foot intrinsic/extrinsic activation, arch stability, post tib  activation. Symptom modification strategies as indicated/appropriate.    Corean Pouch PTA  12/11/2023 12:08 PM

## 2023-12-16 NOTE — Therapy (Incomplete)
 OUTPATIENT PHYSICAL THERAPY TREATMENT NOTE   Patient Name: Brandon [name] MRN: [id number] DOB:1945/08/20, 78 y.o., male Today's Date: 12/16/2023  END OF SESSION:   Past Medical History:  Diagnosis Date   Allergy    Basal cell carcinoma    GERD (gastroesophageal reflux disease)    History of kidney cancer    benign; told not cancerous   Hypertension    Past Surgical History:  Procedure Laterality Date   CATARACT EXTRACTION Bilateral    CRANIOTOMY Bilateral 12/25/2021   Procedure: CRANIOTOMY HEMATOMA EVACUATION SUBDURAL;  Surgeon: Louis Shove, MD;  Location: [facility] OR;  Service: Neurosurgery;  Laterality: Bilateral;   HERNIA REPAIR     x2   KIDNEY SURGERY Right    Had right kidney removed.    TONSILLECTOMY     VASECTOMY     Patient Active Problem List   Diagnosis Date Noted   Chronic ulcer of right great toe, limited to breakdown of skin (HCC) 10/31/2023   Type 2 diabetes mellitus with foot ulcer (CODE) (HCC) 10/31/2023   Posterior tibial tendinitis, right 10/31/2023   Unilateral primary osteoarthritis, right knee 10/15/2023   Cognitive and neurobehavioral dysfunction following brain injury (HCC) 02/07/2022   Gait difficulty 02/07/2022   Traumatic brain injury with loss of consciousness (HCC) 12/30/2021   SDH (subdural hematoma) (HCC) 12/24/2021   Hyperlipidemia 02/13/2018   BPH (benign prostatic hyperplasia) 01/31/2017   Heart murmur 01/20/2016   HTN (hypertension) 03/09/2012    PCP: Merna Huxley, NP  REFERRING PROVIDER: Loel Awanda BIRCH, DPM  REFERRING DIAG: [phone] (ICD-10-CM) - Posterior tibial tendinitis, right  THERAPY DIAG:  No diagnosis found.  Rationale for Evaluation and Treatment: Rehabilitation  ONSET DATE: last few months   SUBJECTIVE:   SUBJECTIVE STATEMENT: 12/16/2023: ***  *** Accompanied by spouse with patient consent, she aids with subjective due to patient being [facility] Patient reports some Rt knee pain today, no new falls since last session.    EVAL: Accompanied by spouse with pt consent. She does assist throughout with history as well. Arrives w/ ankle brace, knee band, and SPC. Notes over past few months R foot has started turning out more and arch has collapsed. Did have a wound on hallux which wife states has mostly healed at this point, today is obscured by gauze/bandage. Denies any N/T, some swelling which he states is chronic. Denies any significant issues with pain but does feel like his foot positioning is affecting his balance.  PERTINENT HISTORY: hx of basal cell carcinoma, HTN, GERD, hx craniotomy, brain injury, DM2 w/ foot ulcer, post tib tendinitis  PAIN:  Denies any pain in foot/ankle  PRECAUTIONS: fall risk, foot wound (R hallux IPJ, healing well per pt spouse)  RED FLAGS: None   WEIGHT BEARING RESTRICTIONS: No  FALLS:  Has patient fallen in last 6 months? Yes. Number of falls 2-3 which they attribute to vertigo and ongoing balance issues  LIVING ENVIRONMENT: Using cane, lives w/ spouse 1 story home, 1 STE  OCCUPATION: retired - enjoys reading, playing bridge, hanging out with family  PLOF: Independent  PATIENT GOALS: work on IT trainer   NEXT MD VISIT: TBD  OBJECTIVE:  Note: Objective measures were completed at Evaluation unless otherwise noted.  DIAGNOSTIC FINDINGS:  R foot XR 11/25/23 per podiatry note: Normal osseous mineralization.  Decreased calcaneal inclination angle and severely increased talar declination angle on lateral view.  Joint space narrowing throughout the midtarsal and tarsometatarsal joints.  No fracture seen   PATIENT SURVEYS:  LEFS: 57/80   COGNITION: Overall cognitive status: grossly WFL although full assessment limited by [facility]     EDEMA/INSPECTION:  Mild swelling noted about ankle joint. No significant erythema present. Brace is initially donned inverted with some bunching about medial aspect   POSTURE: noted reduction in R arch height, tendency  to hold in ER with limited ability to correct   LOWER EXTREMITY ROM:  ROM Right eval Left eval  Knee flexion    Knee extension    Ankle dorsiflexion 8 deg 10 deg  Ankle plantarflexion    Ankle inversion 15 deg  18 deg  Ankle eversion 30 deg 20 deg   (Blank rows = not tested) (Key: WFL = within functional limits not formally assessed, * = concordant pain, s = stiffness/stretching sensation, NT = not tested) Comments:   LOWER EXTREMITY MMT:  MMT Right eval Left eval  Knee flexion    Knee extension    Ankle dorsiflexion 4- 4+  Ankle plantarflexion    Ankle inversion 4- 5  Ankle eversion 4+ 5   (Blank rows = not tested) (Key: WFL = within functional limits not formally assessed, * = concordant pain, s = stiffness/stretching sensation, NT = not tested) Comment:    FUNCTIONAL TESTS:  TUG: 13sec no AD mild instability   GAIT: Distance walked: within clinic Assistive device utilized: Single point cane Level of assistance: Modified independence Comments: R foot ER, increased trunk sway, limited truncal rotation                                                                                                                                TREATMENT DATE:  [facility] Adult PT Treatment:                                                DATE: 12/17/23 Therapeutic Exercise: *** Manual Therapy: *** Neuromuscular re-ed: *** Therapeutic Activity: *** Modalities: *** Self Care: ***    [facility] Adult PT Treatment:                                                DATE: 12/11/23 Therapeutic Exercise: Nustep level 5 x 5 mins while gathering subjective info and planning session with patient Neuromuscular re-ed: Seated heel raises 2x10 Seated toe raises 2x10 Seated heel raises on 2 step with 5# KB above knee 2x10 BIL Seated inv/ev towel slides x20 ea Seated towel scrunches 2x1' BIL Toe yoga x1' Standing alternating cone taps with UE support x30 Therapeutic Activity: Standing heel  raises x10 with UE support STS with light UE push 2x10   [facility] Adult PT Treatment:  DATE: 12/04/23 Therapeutic Exercise: HEP practice + education/handout (encouraged toe scrunches w/o towel)   Self Care: Assist with donning brace w/ concurrent education on appropriate fit and sequencing, monitoring skin integrity, rationale for interventions, relevant anatomy/physiology as pertains to mechanics and PT POC   PATIENT EDUCATION:  Education details: rationale for interventions, HEP  Person educated: Patient Education method: Explanation, Demonstration, Tactile cues, Verbal cues Education comprehension: verbalized understanding, returned demonstration, verbal cues required, tactile cues required, and needs further education     HOME EXERCISE PROGRAM: Access Code: [email or web address] URL: [email or web address] Date: 12/04/2023 Prepared by: Alm Jenny  Exercises - Ankle Inversion Eversion Towel Slide  - 2-3 x daily - 1 sets - 8-12 reps - Towel Scrunches  - 2-3 x daily - 1 sets - 8-12 reps  ASSESSMENT:  CLINICAL IMPRESSION: 12/16/2023: ***  *** Patient presents to first follow up PT session reporting some Rt knee pain, no new falls, overall doing well. Session today focused on foot intrinsic strengthening, balance tasks, and distal LE strengthening. He tolerates all exercises well, needs some cues for form throughout. Patient continues to benefit from skilled PT services and should be progressed as able to improve functional independence.   EVAL: Patient is a pleasant 78 y.o. gentleman who was seen today for physical therapy evaluation and treatment for posterior tibialis issues. He denies any significant pain, but they endorse gradual increase in instability and altered mechanics over past few months. Extensive fall history but reports increased difficulty with balance since onset. On exam he demonstrates reduced ankle ROM/strength on  affected side and altered mechanics w/ gait/transfers. No red flags on exam today, education on improving fit with brace and monitoring skin integrity. No adverse events. Recommend trial of skilled PT to address aforementioned deficits with aim of improving functional tolerance and reducing pain with typical activities. Pt departs today's session in no acute distress, all voiced concerns/questions addressed appropriately from PT perspective.     OBJECTIVE IMPAIRMENTS: Abnormal gait, decreased activity tolerance, decreased balance, decreased endurance, decreased mobility, difficulty walking, decreased ROM, decreased strength, impaired perceived functional ability, improper body mechanics, and postural dysfunction.   ACTIVITY LIMITATIONS: carrying, lifting, standing, stairs, transfers, and locomotion level  PARTICIPATION LIMITATIONS: community activity  PERSONAL FACTORS: Age, Time since onset of injury/illness/exacerbation, and 3+ comorbidities:  hx of basal cell carcinoma, HTN, GERD, hx craniotomy, brain injury, DM2 w/ foot ulcer, post tib tendinitis are also affecting patient's functional outcome.   REHAB POTENTIAL: Fair given comorbidities and chronicity  CLINICAL DECISION MAKING: Evolving/moderate complexity  EVALUATION COMPLEXITY: Moderate   GOALS:   SHORT TERM GOALS: Target date: 01/01/2024  Pt will demonstrate appropriate understanding and performance of initially prescribed HEP in order to facilitate improved independence with management of symptoms.  Baseline: HEP established  Goal status: INITIAL   2. Pt will report at least 25% improvement in overall pain levels over past week in order to facilitate improved tolerance to typical daily activities.   Baseline: see ROM chart above  Goal status: INITIAL    LONG TERM GOALS: Target date: 01/15/2024  Pt will be able to perform TUG in less than or equal to 11 sec in order to indicate reduced risk of falling (cutoff score for fall risk  13.5 sec in community dwelling older adults per [facility] et al, 2000)  Baseline: 13sec no AD, mild instability  Goal status: INITIAL    2.  Pt will demonstrate at least 4+/5 ankle MMT in tested groups for improved functional strength. Baseline: see MMT  chart above Goal status: INITIAL  3.  Pt will demonstrate appropriate performance of final prescribed HEP in order to facilitate improved self-management of symptoms post-discharge.   Baseline: initial HEP prescribed  Goal status: INITIAL    4.  Pt will demonstrate grossly symmetrical ankle inversion/DF AROM in order to facilitate improved functional mobility and walking mechanics. Baseline: see ROM chart above Goal status: INITIAL   5. Pt will improve score at least 65/80 on LEFS in order to indicate improved perception of function d/t LE.   Baseline: 57/80  Goal status: INITIAL   PLAN:  PT FREQUENCY: 1x/week  PT DURATION: 6 weeks  PLANNED INTERVENTIONS: 97164- PT Re-evaluation, 97750- Physical Performance Testing, 97110-Therapeutic exercises, 97530- Therapeutic activity, [id number]- Neuromuscular re-education, 97535- Self Care, 02859- Manual therapy, [id number]- Gait training, [phone] (1-2 muscles), [id number] (3+ muscles)- Dry Needling, Patient/Family education, Balance training, Stair training, Taping, Joint mobilization, Cryotherapy, and Moist heat  PLAN FOR NEXT SESSION: Review/update HEP PRN. Work on [employer] exercises as appropriate with emphasis on foot intrinsic/extrinsic activation, arch stability, post tib activation. Symptom modification strategies as indicated/appropriate.    Alm DELENA Jenny PT, DPT 12/16/2023 1:39 PM

## 2023-12-17 ENCOUNTER — Ambulatory Visit: Admitting: Physical Therapy

## 2023-12-25 ENCOUNTER — Ambulatory Visit: Attending: Podiatry

## 2023-12-25 DIAGNOSIS — R262 Difficulty in walking, not elsewhere classified: Secondary | ICD-10-CM | POA: Diagnosis not present

## 2023-12-25 DIAGNOSIS — M6281 Muscle weakness (generalized): Secondary | ICD-10-CM | POA: Insufficient documentation

## 2023-12-25 DIAGNOSIS — R2681 Unsteadiness on feet: Secondary | ICD-10-CM | POA: Insufficient documentation

## 2023-12-25 DIAGNOSIS — R2689 Other abnormalities of gait and mobility: Secondary | ICD-10-CM | POA: Insufficient documentation

## 2023-12-25 NOTE — Therapy (Signed)
 OUTPATIENT PHYSICAL THERAPY TREATMENT NOTE   Patient Name: Brandon [name] MRN: [id number] DOB:1946/01/26, 78 y.o., male Today's Date: 12/25/2023  END OF SESSION:  PT End of Session - 12/25/23 1043     Visit Number 3    Number of Visits 7    Date for PT Re-Evaluation 01/15/24    Authorization Type humana medicare    Authorization Time Period 6 visits approved 12/04/23-03/03/14    Authorization - Visit Number 2    Authorization - Number of Visits 6    Progress Note Due on Visit 10    PT Start Time 1045    PT Stop Time 1123    PT Time Calculation (min) 38 min    Activity Tolerance Patient tolerated treatment well    Behavior During Therapy WFL for tasks assessed/performed          Past Medical History:  Diagnosis Date   Allergy    Basal cell carcinoma    GERD (gastroesophageal reflux disease)    History of kidney cancer    benign; told not cancerous   Hypertension    Past Surgical History:  Procedure Laterality Date   CATARACT EXTRACTION Bilateral    CRANIOTOMY Bilateral 12/25/2021   Procedure: CRANIOTOMY HEMATOMA EVACUATION SUBDURAL;  Surgeon: Louis Shove, MD;  Location: [facility] OR;  Service: Neurosurgery;  Laterality: Bilateral;   HERNIA REPAIR     x2   KIDNEY SURGERY Right    Had right kidney removed.    TONSILLECTOMY     VASECTOMY     Patient Active Problem List   Diagnosis Date Noted   Chronic ulcer of right great toe, limited to breakdown of skin (HCC) 10/31/2023   Type 2 diabetes mellitus with foot ulcer (CODE) (HCC) 10/31/2023   Posterior tibial tendinitis, right 10/31/2023   Unilateral primary osteoarthritis, right knee 10/15/2023   Cognitive and neurobehavioral dysfunction following brain injury (HCC) 02/07/2022   Gait difficulty 02/07/2022   Traumatic brain injury with loss of consciousness (HCC) 12/30/2021   SDH (subdural hematoma) (HCC) 12/24/2021   Hyperlipidemia 02/13/2018   BPH (benign prostatic hyperplasia) 01/31/2017   Heart murmur 01/20/2016    HTN (hypertension) 03/09/2012    PCP: Merna Huxley, NP  REFERRING PROVIDER: Loel Awanda BIRCH, DPM  REFERRING DIAG: [phone] (ICD-10-CM) - Posterior tibial tendinitis, right  THERAPY DIAG:  Difficulty in walking, not elsewhere classified  Muscle weakness (generalized)  Rationale for Evaluation and Treatment: Rehabilitation  ONSET DATE: last few months   SUBJECTIVE:   SUBJECTIVE STATEMENT: Patient reports no pain today.  EVAL: Accompanied by spouse with pt consent. She does assist throughout with history as well. Arrives w/ ankle brace, knee band, and SPC. Notes over past few months R foot has started turning out more and arch has collapsed. Did have a wound on hallux which wife states has mostly healed at this point, today is obscured by gauze/bandage. Denies any N/T, some swelling which he states is chronic. Denies any significant issues with pain but does feel like his foot positioning is affecting his balance.  PERTINENT HISTORY: hx of basal cell carcinoma, HTN, GERD, hx craniotomy, brain injury, DM2 w/ foot ulcer, post tib tendinitis  PAIN:  Denies any pain in foot/ankle  PRECAUTIONS: fall risk, foot wound (R hallux IPJ, healing well per pt spouse)  RED FLAGS: None   WEIGHT BEARING RESTRICTIONS: No  FALLS:  Has patient fallen in last 6 months? Yes. Number of falls 2-3 which they attribute to vertigo and ongoing balance issues  LIVING ENVIRONMENT: Using cane, lives w/ spouse 1 story home, 1 STE  OCCUPATION: retired - enjoys reading, playing bridge, hanging out with family  PLOF: Independent  PATIENT GOALS: work on IT trainer   NEXT MD VISIT: TBD  OBJECTIVE:  Note: Objective measures were completed at Evaluation unless otherwise noted.  DIAGNOSTIC FINDINGS:  R foot XR 11/25/23 per podiatry note: Normal osseous mineralization.  Decreased calcaneal inclination angle and severely increased talar declination angle on lateral view.  Joint  space narrowing throughout the midtarsal and tarsometatarsal joints.  No fracture seen   PATIENT SURVEYS:  LEFS: 57/80   COGNITION: Overall cognitive status: grossly WFL although full assessment limited by [facility]     EDEMA/INSPECTION:  Mild swelling noted about ankle joint. No significant erythema present. Brace is initially donned inverted with some bunching about medial aspect   POSTURE: noted reduction in R arch height, tendency to hold in ER with limited ability to correct   LOWER EXTREMITY ROM:  ROM Right eval Left eval  Knee flexion    Knee extension    Ankle dorsiflexion 8 deg 10 deg  Ankle plantarflexion    Ankle inversion 15 deg  18 deg  Ankle eversion 30 deg 20 deg   (Blank rows = not tested) (Key: WFL = within functional limits not formally assessed, * = concordant pain, s = stiffness/stretching sensation, NT = not tested) Comments:   LOWER EXTREMITY MMT:  MMT Right eval Left eval  Knee flexion    Knee extension    Ankle dorsiflexion 4- 4+  Ankle plantarflexion    Ankle inversion 4- 5  Ankle eversion 4+ 5   (Blank rows = not tested) (Key: WFL = within functional limits not formally assessed, * = concordant pain, s = stiffness/stretching sensation, NT = not tested) Comment:   EDEMA MEASUREMENT: 12/25/23: Lt 29cm, 31cm  FUNCTIONAL TESTS:  TUG: 13sec no AD mild instability   GAIT: Distance walked: within clinic Assistive device utilized: Single point cane Level of assistance: Modified independence Comments: R foot ER, increased trunk sway, limited truncal rotation                                                                                                                                TREATMENT DATE:  [facility] Adult PT Treatment:                                                DATE: 12/25/23 Therapeutic Exercise: Nustep level 5 x 5 mins while gathering subjective info and planning session with patient Neuromuscular re-ed: Seated heel raises  2x10 Seated toe raises 2x10 Seated heel raises on 2 step with 5# KB above knee 2x10 BIL Seated inv/ev towel slides x20 ea Seated towel scrunches 2x1' BIL Toe yoga x1' Standing alternating cone taps with  UE support 2x30 Therapeutic Activity: Standing heel raises 2x10 with UE support STS with light UE push 2x10   [facility] Adult PT Treatment:                                                DATE: 12/11/23 Therapeutic Exercise: Nustep level 5 x 5 mins while gathering subjective info and planning session with patient Neuromuscular re-ed: Seated heel raises 2x10 Seated toe raises 2x10 Seated heel raises on 2 step with 5# KB above knee 2x10 BIL Seated inv/ev towel slides x20 ea Seated towel scrunches 2x1' BIL Toe yoga x1' Standing alternating cone taps with UE support x30 Therapeutic Activity: Standing heel raises x10 with UE support STS with light UE push 2x10   PATIENT EDUCATION:  Education details: rationale for interventions, HEP  Person educated: Patient Education method: Explanation, Demonstration, Tactile cues, Verbal cues Education comprehension: verbalized understanding, returned demonstration, verbal cues required, tactile cues required, and needs further education     HOME EXERCISE PROGRAM: Access Code: [email or web address] URL: [email or web address] Date: 12/04/2023 Prepared by: Alm Jenny  Exercises - Ankle Inversion Eversion Towel Slide  - 2-3 x daily - 1 sets - 8-12 reps - Towel Scrunches  - 2-3 x daily - 1 sets - 8-12 reps  ASSESSMENT:  CLINICAL IMPRESSION: Patient presents to PT reporting no current pain in his foot/ankle, does have some knee pain. Session today continued to focused on balance tasks to improve proprioception and decrease fall risk, LE and foot intrinsic strengthening. Patient was able to tolerate all prescribed exercises with no adverse effects. Patient continues to benefit from skilled PT services and should be progressed as able to improve  functional independence.   EVAL: Patient is a pleasant 78 y.o. gentleman who was seen today for physical therapy evaluation and treatment for posterior tibialis issues. He denies any significant pain, but they endorse gradual increase in instability and altered mechanics over past few months. Extensive fall history but reports increased difficulty with balance since onset. On exam he demonstrates reduced ankle ROM/strength on affected side and altered mechanics w/ gait/transfers. No red flags on exam today, education on improving fit with brace and monitoring skin integrity. No adverse events. Recommend trial of skilled PT to address aforementioned deficits with aim of improving functional tolerance and reducing pain with typical activities. Pt departs today's session in no acute distress, all voiced concerns/questions addressed appropriately from PT perspective.     OBJECTIVE IMPAIRMENTS: Abnormal gait, decreased activity tolerance, decreased balance, decreased endurance, decreased mobility, difficulty walking, decreased ROM, decreased strength, impaired perceived functional ability, improper body mechanics, and postural dysfunction.   ACTIVITY LIMITATIONS: carrying, lifting, standing, stairs, transfers, and locomotion level  PARTICIPATION LIMITATIONS: community activity  PERSONAL FACTORS: Age, Time since onset of injury/illness/exacerbation, and 3+ comorbidities:  hx of basal cell carcinoma, HTN, GERD, hx craniotomy, brain injury, DM2 w/ foot ulcer, post tib tendinitis are also affecting patient's functional outcome.   REHAB POTENTIAL: Fair given comorbidities and chronicity  CLINICAL DECISION MAKING: Evolving/moderate complexity  EVALUATION COMPLEXITY: Moderate   GOALS:   SHORT TERM GOALS: Target date: 01/01/2024  Pt will demonstrate appropriate understanding and performance of initially prescribed HEP in order to facilitate improved independence with management of symptoms.  Baseline: HEP  established  Goal status: MET Pt reports adherence 12/25/23   2. Pt will report at least 25% improvement in  overall pain levels over past week in order to facilitate improved tolerance to typical daily activities.   Baseline: see ROM chart above  Goal status: INITIAL    LONG TERM GOALS: Target date: 01/15/2024  Pt will be able to perform TUG in less than or equal to 11 sec in order to indicate reduced risk of falling (cutoff score for fall risk 13.5 sec in community dwelling older adults per [facility] et al, 2000)  Baseline: 13sec no AD, mild instability  Goal status: INITIAL    2.  Pt will demonstrate at least 4+/5 ankle MMT in tested groups for improved functional strength. Baseline: see MMT chart above Goal status: INITIAL  3.  Pt will demonstrate appropriate performance of final prescribed HEP in order to facilitate improved self-management of symptoms post-discharge.   Baseline: initial HEP prescribed  Goal status: INITIAL    4.  Pt will demonstrate grossly symmetrical ankle inversion/DF AROM in order to facilitate improved functional mobility and walking mechanics. Baseline: see ROM chart above Goal status: INITIAL   5. Pt will improve score at least 65/80 on LEFS in order to indicate improved perception of function d/t LE.   Baseline: 57/80  Goal status: INITIAL   PLAN:  PT FREQUENCY: 1x/week  PT DURATION: 6 weeks  PLANNED INTERVENTIONS: 97164- PT Re-evaluation, 97750- Physical Performance Testing, 97110-Therapeutic exercises, 97530- Therapeutic activity, [id number]- Neuromuscular re-education, 97535- Self Care, 02859- Manual therapy, [id number]- Gait training, [phone] (1-2 muscles), 20561 (3+ muscles)- Dry Needling, Patient/Family education, Balance training, Stair training, Taping, Joint mobilization, Cryotherapy, and Moist heat  PLAN FOR NEXT SESSION: Review/update HEP PRN. Work on [employer] exercises as appropriate with emphasis on foot intrinsic/extrinsic activation, arch  stability, post tib activation. Symptom modification strategies as indicated/appropriate.    Corean Pouch PTA  12/25/2023 11:23 AM

## 2023-12-26 ENCOUNTER — Other Ambulatory Visit: Payer: Self-pay | Admitting: Adult Health

## 2023-12-27 ENCOUNTER — Ambulatory Visit: Payer: Self-pay | Admitting: Adult Health

## 2023-12-27 ENCOUNTER — Ambulatory Visit (INDEPENDENT_AMBULATORY_CARE_PROVIDER_SITE_OTHER): Payer: Medicare HMO | Admitting: Adult Health

## 2023-12-27 ENCOUNTER — Encounter: Payer: Self-pay | Admitting: Adult Health

## 2023-12-27 VITALS — BP 120/80 | HR 56 | Temp 97.9°F | Ht 78.0 in | Wt 243.0 lb

## 2023-12-27 DIAGNOSIS — E782 Mixed hyperlipidemia: Secondary | ICD-10-CM

## 2023-12-27 DIAGNOSIS — Z0000 Encounter for general adult medical examination without abnormal findings: Secondary | ICD-10-CM | POA: Diagnosis not present

## 2023-12-27 DIAGNOSIS — E119 Type 2 diabetes mellitus without complications: Secondary | ICD-10-CM

## 2023-12-27 DIAGNOSIS — C61 Malignant neoplasm of prostate: Secondary | ICD-10-CM

## 2023-12-27 DIAGNOSIS — I10 Essential (primary) hypertension: Secondary | ICD-10-CM

## 2023-12-27 DIAGNOSIS — Z7984 Long term (current) use of oral hypoglycemic drugs: Secondary | ICD-10-CM | POA: Diagnosis not present

## 2023-12-27 LAB — COMPREHENSIVE METABOLIC PANEL WITH GFR
ALT: 12 U/L (ref 0–53)
AST: 15 U/L (ref 0–37)
Albumin: 3.9 g/dL (ref 3.5–5.2)
Alkaline Phosphatase: 50 U/L (ref 39–117)
BUN: 12 mg/dL (ref 6–23)
CO2: 28 meq/L (ref 19–32)
Calcium: 8.9 mg/dL (ref 8.4–10.5)
Chloride: 99 meq/L (ref 96–112)
Creatinine, Ser: 0.84 mg/dL (ref 0.40–1.50)
GFR: 82.32 mL/min (ref >60.00)
Glucose, Bld: 129 mg/dL — ABNORMAL HIGH (ref 70–99)
Potassium: 3.3 meq/L — ABNORMAL LOW (ref 3.5–5.1)
Sodium: 135 meq/L (ref 135–145)
Total Bilirubin: 1.3 mg/dL — ABNORMAL HIGH (ref 0.2–1.2)
Total Protein: 6.5 g/dL (ref 6.0–8.3)

## 2023-12-27 LAB — PSA: PSA: 6.31 ng/mL — ABNORMAL HIGH (ref 0.10–4.00)

## 2023-12-27 LAB — LIPID PANEL
Cholesterol: 90 mg/dL (ref 0–200)
HDL: 37 mg/dL — ABNORMAL LOW (ref >39.00)
LDL Cholesterol: 43 mg/dL (ref 0–99)
NonHDL: 53.27
Total CHOL/HDL Ratio: 2
Triglycerides: 51 mg/dL (ref 0.0–149.0)
VLDL: 10.2 mg/dL (ref 0.0–40.0)

## 2023-12-27 LAB — CBC
HCT: 42 % (ref 39.0–52.0)
Hemoglobin: 14.4 g/dL (ref 13.0–17.0)
MCHC: 34.4 g/dL (ref 30.0–36.0)
MCV: 87 fl (ref 78.0–100.0)
Platelets: 141 K/uL — ABNORMAL LOW (ref 150.0–400.0)
RBC: 4.83 Mil/uL (ref 4.22–5.81)
RDW: 13.4 % (ref 11.5–15.5)
WBC: 4.9 K/uL (ref 4.0–10.5)

## 2023-12-27 LAB — HEMOGLOBIN A1C: Hgb A1c MFr Bld: 7.1 % — ABNORMAL HIGH (ref 4.6–6.5)

## 2023-12-27 LAB — MICROALBUMIN / CREATININE URINE RATIO
Creatinine,U: 403.7 mg/dL
Microalb Creat Ratio: 30.2 mg/g — ABNORMAL HIGH (ref 0.0–30.0)
Microalb, Ur: 12.2 mg/dL — ABNORMAL HIGH (ref 0.0–1.9)

## 2023-12-27 LAB — TSH: TSH: 1.24 u[IU]/mL (ref 0.35–5.50)

## 2023-12-27 MED ORDER — ATORVASTATIN CALCIUM 20 MG PO TABS: 20.0000 mg | ORAL_TABLET | Freq: Every day | ORAL | 3 refills | Status: DC

## 2023-12-27 MED ORDER — LISINOPRIL-HYDROCHLOROTHIAZIDE 20-25 MG PO TABS: 1.0000 | ORAL_TABLET | Freq: Every day | ORAL | 3 refills | Status: AC

## 2023-12-27 NOTE — Progress Notes (Signed)
 Subjective:    Patient ID: Brandon [name], male    DOB: 1945-07-22, 78 y.o.   MRN: [id number]  HPI Patient presents for yearly preventative medicine examination. He is a pleasant 78 year old male who  has a past medical history of Allergy, Basal cell carcinoma, GERD (gastroesophageal reflux disease), History of kidney cancer, and Hypertension.  Diabetes Mellitus - maintained on metformin  500 mg BID.  He has been snacking on sweets and carbs. Lab Results  Component Value Date   HGBA1C 7.0 (A) 10/03/2023   HGBA1C 7.0 (A) 07/02/2023   HGBA1C 6.5 12/26/2022   Wt Readings from Last 3 Encounters:  12/27/23 243 lb (110.2 kg)  10/24/23 244 lb 12.8 oz (111 kg)  10/22/23 250 lb (113.4 kg)   HTN - managed with lisinopril /hydrochlorothiazide -25 mg daily.  He denies dizziness, lightheadedness, chest pain, or shortness of breath BP Readings from Last 3 Encounters:  12/27/23 120/80  10/24/23 130/82  10/22/23 (!) 157/87   Hyperlipidemia-managed with Lipitor 20 mg daily.  He denies myalgia or fatigue Lab Results  Component Value Date   CHOL 104 12/26/2022   HDL 36.50 (L) 12/26/2022   LDLCALC 60 12/26/2022   TRIG 36.0 12/26/2022   CHOLHDL 3 12/26/2022   Prostate cancer-Is no longer being seen by Urology due to slow growing nature of his prostate cancer.  He stopped Flomax  and Proscar  as he found it not to be beneficial   All immunizations and health maintenance protocols were reviewed with the patient and needed orders were placed.  Appropriate screening laboratory values were ordered for the patient including screening of hyperlipidemia, renal function and hepatic function. If indicated by BPH, a PSA was ordered.  Medication reconciliation,  past medical history, social history, problem list and allergies were reviewed in detail with the patient  Goals were established with regard to weight loss, exercise, and  diet in compliance with medications  He has no acute complaints today    Review of Systems  Constitutional: Negative.   HENT: Negative.    Eyes: Negative.   Respiratory: Negative.    Cardiovascular: Negative.   Gastrointestinal: Negative.   Endocrine: Negative.   Genitourinary: Negative.   Musculoskeletal:  Positive for gait problem.  Skin: Negative.   Allergic/Immunologic: Negative.   Hematological: Negative.   Psychiatric/Behavioral: Negative.    All other systems reviewed and are negative.  Past Medical History:  Diagnosis Date   Allergy    Basal cell carcinoma    GERD (gastroesophageal reflux disease)    History of kidney cancer    benign; told not cancerous   Hypertension     Social History   Socioeconomic History   Marital status: Married    Spouse name: Not on file   Number of children: Not on file   Years of education: Not on file   Highest education level: Bachelor's degree (e.g., BA, AB, BS)  Occupational History   Not on file  Tobacco Use   Smoking status: Former   Smokeless tobacco: Never  Vaping Use   Vaping status: Never Used  Substance and Sexual Activity   Alcohol use: Not Currently    Comment: Beer every once in a while   Drug use: No   Sexual activity: Not Currently  Other Topics Concern   Not on file  Social History Narrative   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   Married    Three children (Two in [place] and [place]    Right handed  Social Drivers of Corporate investment banker Strain: Low Risk  (04/10/2023)   Overall Financial Resource Strain (CARDIA)    Difficulty of Paying Living Expenses: Not hard at all  Food Insecurity: No Food Insecurity (04/10/2023)   Hunger Vital Sign    Worried About Running Out of Food in the Last Year: Never true    Ran Out of Food in the Last Year: Never true  Transportation Needs: No Transportation Needs (04/10/2023)   PRAPARE - Administrator, Civil Service (Medical): No    Lack of Transportation (Non-Medical): No  Physical Activity: Sufficiently Active  (04/10/2023)   Exercise Vital Sign    Days of Exercise per Week: 7 days    Minutes of Exercise per Session: 40 min  Stress: No Stress Concern Present (04/10/2023)   [employer] of Occupational Health - Occupational Stress Questionnaire    Feeling of Stress : Not at all  Social Connections: Socially Integrated (04/10/2023)   Social Connection and Isolation Panel    Frequency of Communication with Friends and Family: More than three times a week    Frequency of Social Gatherings with Friends and Family: More than three times a week    Attends Religious Services: More than 4 times per year    Active Member of [employer] or Organizations: Yes    Attends Banker Meetings: More than 4 times per year    Marital Status: Married  Catering manager Violence: Not At Risk (04/10/2023)   Humiliation, Afraid, Rape, and Kick questionnaire    Fear of Current or Ex-Partner: No    Emotionally Abused: No    Physically Abused: No    Sexually Abused: No    Past Surgical History:  Procedure Laterality Date   CATARACT EXTRACTION Bilateral    CRANIOTOMY Bilateral 12/25/2021   Procedure: CRANIOTOMY HEMATOMA EVACUATION SUBDURAL;  Surgeon: Louis Shove, MD;  Location: [facility] OR;  Service: Neurosurgery;  Laterality: Bilateral;   HERNIA REPAIR     x2   KIDNEY SURGERY Right    Had right kidney removed.    TONSILLECTOMY     VASECTOMY      Family History  Problem Relation Age of Onset   Cancer Mother        Breast    Alzheimer's disease Mother    Cancer Father        Lung    Colon cancer Neg Hx     No Known Allergies  Current Outpatient Medications on File Prior to Visit  Medication Sig Dispense Refill   acetaminophen  (TYLENOL ) 500 MG tablet Take 1 tablet (500 mg total) by mouth 2 (two) times daily as needed for mild pain. 30 tablet 0   atorvastatin  (LIPITOR) 20 MG tablet TAKE 1 TABLET BY MOUTH EVERY DAY 90 tablet 0   cetirizine (ZYRTEC) 10 MG tablet Take 10 mg by mouth daily.      Cholecalciferol  25 MCG (1000 UT) tablet Take 1 tablet (1,000 Units total) by mouth daily. 30 tablet 0   diclofenac  Sodium (VOLTAREN ) 1 % GEL Apply 4 g topically 3 (three) times daily. 100 g 0   fluorouracil (EFUDEX) 5 % cream Apply topically 2 (two) times daily.     lisinopril -hydrochlorothiazide  (ZESTORETIC ) 20-25 MG tablet TAKE 1 TABLET BY MOUTH EVERY DAY 90 tablet 0   meclizine  (ANTIVERT ) 25 MG tablet Take 1 tablet (25 mg total) by mouth 3 (three) times daily as needed. 30 tablet 0   metFORMIN  (GLUCOPHAGE -XR) 500 MG 24 hr tablet  Take 1 tablet (500 mg total) by mouth daily with breakfast. 90 tablet 1   latanoprost  (XALATAN ) 0.005 % ophthalmic solution Place 1 drop into both eyes at bedtime. (Patient not taking: Reported on 12/27/2023)     No current facility-administered medications on file prior to visit.    BP 120/80   Pulse (!) 56   Temp 97.9 F (36.6 C) (Oral)   Ht 6' 6 (1.981 m)   Wt 243 lb (110.2 kg)   SpO2 95%   BMI 28.08 kg/m       Objective:   Physical Exam Vitals and nursing note reviewed.  Constitutional:      General: He is not in acute distress.    Appearance: Normal appearance. He is not ill-appearing.  HENT:     Head: Normocephalic and atraumatic.     Right Ear: Tympanic membrane, ear canal and external ear normal. There is no impacted cerumen.     Left Ear: Tympanic membrane, ear canal and external ear normal. There is no impacted cerumen.     Nose: Nose normal. No congestion or rhinorrhea.     Mouth/Throat:     Mouth: Mucous membranes are moist.     Pharynx: Oropharynx is clear.  Eyes:     Extraocular Movements: Extraocular movements intact.     Conjunctiva/sclera: Conjunctivae normal.     Pupils: Pupils are equal, round, and reactive to light.  Neck:     Vascular: No carotid bruit.  Cardiovascular:     Rate and Rhythm: Normal rate and regular rhythm.     Pulses: Normal pulses.     Heart sounds: Murmur heard.     No friction rub. No gallop.   Pulmonary:     Effort: Pulmonary effort is normal.     Breath sounds: Normal breath sounds.  Abdominal:     General: Abdomen is flat. Bowel sounds are normal. There is no distension.     Palpations: Abdomen is soft. There is no mass.     Tenderness: There is no abdominal tenderness. There is no guarding or rebound.     Hernia: No hernia is present.  Musculoskeletal:        General: Normal range of motion.     Cervical back: Normal range of motion and neck supple.  Lymphadenopathy:     Cervical: No cervical adenopathy.  Skin:    General: Skin is warm and dry.     Capillary Refill: Capillary refill takes less than 2 seconds.  Neurological:     General: No focal deficit present.     Mental Status: He is alert and oriented to person, place, and time.  Psychiatric:        Mood and Affect: Mood normal.        Behavior: Behavior normal.        Thought Content: Thought content normal.        Judgment: Judgment normal.        Assessment & Plan:  1. Routine general medical examination at a health care facility (Primary) Today patient counseled on age appropriate routine health concerns for screening and prevention, each reviewed and up to date or declined. Immunizations reviewed and up to date or declined. Labs ordered and reviewed. Risk factors for depression reviewed and negative. Hearing function and visual acuity are intact. ADLs screened and addressed as needed. Functional ability and level of safety reviewed and appropriate. Education, counseling and referrals performed based on assessed risks today. Patient provided with a copy of  personalized plan for preventive services. - Follow up in one year or sooner if needed - eat healthy and exercise   2. Diabetes mellitus treated with oral medication (HCC) - Consider increase in Metformin   - Likely follow up in 3 months  - Lipid panel; Future - TSH; Future - CBC; Future - Comprehensive metabolic panel with GFR; Future - Hemoglobin  A1c; Future - Microalbumin/Creatinine Ratio, Urine; Future  3. Essential hypertension - Well controlled. No change in medication  - Lipid panel; Future - TSH; Future - CBC; Future - Comprehensive metabolic panel with GFR; Future - lisinopril -hydrochlorothiazide  (ZESTORETIC ) 20-25 MG tablet; Take 1 tablet by mouth daily.  Dispense: 90 tablet; Refill: 3  4. Mixed hyperlipidemia - Continue with Lipitor 20 mg daily.  - Lipid panel; Future - TSH; Future - CBC; Future - Comprehensive metabolic panel with GFR; Future - atorvastatin  (LIPITOR) 20 MG tablet; Take 1 tablet (20 mg total) by mouth daily.  Dispense: 90 tablet; Refill: 3  5. Prostate cancer (HCC)  - PSA; Future  Darleene Shape, NP

## 2023-12-27 NOTE — Patient Instructions (Signed)
 It was great seeing you today   We will follow up with you regarding your lab work   Please let me know if you need anything

## 2023-12-30 ENCOUNTER — Ambulatory Visit: Admitting: Podiatry

## 2023-12-31 ENCOUNTER — Telehealth: Payer: Self-pay

## 2023-12-31 DIAGNOSIS — I10 Essential (primary) hypertension: Secondary | ICD-10-CM

## 2023-12-31 NOTE — Telephone Encounter (Unsigned)
 Copied from CRM #[id number]. Topic: Clinical - Prescription Issue >> Dec 30, 2023  2:07 PM Mesmerise C wrote: Reason for CRM: Alan from [facility] advised didn't receive prescription for lisinopril  they sent a request for it can be faxed to [phone]

## 2024-01-01 ENCOUNTER — Ambulatory Visit

## 2024-01-01 ENCOUNTER — Telehealth: Payer: Self-pay | Admitting: Adult Health

## 2024-01-01 DIAGNOSIS — M6281 Muscle weakness (generalized): Secondary | ICD-10-CM | POA: Diagnosis not present

## 2024-01-01 DIAGNOSIS — R2681 Unsteadiness on feet: Secondary | ICD-10-CM | POA: Diagnosis not present

## 2024-01-01 DIAGNOSIS — R2689 Other abnormalities of gait and mobility: Secondary | ICD-10-CM | POA: Diagnosis not present

## 2024-01-01 DIAGNOSIS — R262 Difficulty in walking, not elsewhere classified: Secondary | ICD-10-CM

## 2024-01-01 NOTE — Telephone Encounter (Signed)
 Patients wife came in regarding a message that was left yesterday regarding patients labs, wife stated message was cut off, I said I could send a message back, but everyone is at lunch right now and she stated she figured as much, I did tell her a message was left yesterday and she wanted to know if she was called as she is his wife, I stated I was not sure what number was called and she turned and left, so I am not sure if she wants to be called back or not.

## 2024-01-01 NOTE — Therapy (Signed)
 OUTPATIENT PHYSICAL THERAPY TREATMENT NOTE   Patient Name: Brandon [name] MRN: [id number] DOB:15-Dec-1945, 78 y.o., male Today's Date: 01/01/2024  END OF SESSION:  PT End of Session - 01/01/24 0956     Visit Number 4    Number of Visits 7    Date for PT Re-Evaluation 01/15/24    Authorization Type [employer]    Authorization Time Period 6 visits approved 12/04/23-03/03/14    Authorization - Visit Number 3    Authorization - Number of Visits 6    Progress Note Due on Visit 10    PT Start Time 1000    PT Stop Time 1030    PT Time Calculation (min) 30 min    Activity Tolerance Patient tolerated treatment well    Behavior During Therapy WFL for tasks assessed/performed           Past Medical History:  Diagnosis Date   Allergy    Basal cell carcinoma    GERD (gastroesophageal reflux disease)    History of kidney cancer    benign; told not cancerous   Hypertension    Past Surgical History:  Procedure Laterality Date   CATARACT EXTRACTION Bilateral    CRANIOTOMY Bilateral 12/25/2021   Procedure: CRANIOTOMY HEMATOMA EVACUATION SUBDURAL;  Surgeon: Louis Shove, MD;  Location: [facility] OR;  Service: Neurosurgery;  Laterality: Bilateral;   HERNIA REPAIR     x2   KIDNEY SURGERY Right    Had right kidney removed.    TONSILLECTOMY     VASECTOMY     Patient Active Problem List   Diagnosis Date Noted   Chronic ulcer of right great toe, limited to breakdown of skin (HCC) 10/31/2023   Type 2 diabetes mellitus with foot ulcer (CODE) (HCC) 10/31/2023   Posterior tibial tendinitis, right 10/31/2023   Unilateral primary osteoarthritis, right knee 10/15/2023   Cognitive and neurobehavioral dysfunction following brain injury (HCC) 02/07/2022   Gait difficulty 02/07/2022   Traumatic brain injury with loss of consciousness (HCC) 12/30/2021   SDH (subdural hematoma) (HCC) 12/24/2021   Hyperlipidemia 02/13/2018   BPH (benign prostatic hyperplasia) 01/31/2017   Heart murmur 01/20/2016    HTN (hypertension) 03/09/2012    PCP: Merna Huxley, NP  REFERRING PROVIDER: Loel Awanda BIRCH, DPM  REFERRING DIAG: [phone] (ICD-10-CM) - Posterior tibial tendinitis, right  THERAPY DIAG:  Difficulty in walking, not elsewhere classified  Muscle weakness (generalized)  Unsteadiness on feet  Balance problem  Rationale for Evaluation and Treatment: Rehabilitation  ONSET DATE: last few months   SUBJECTIVE:   SUBJECTIVE STATEMENT: Patient reports no current pain. Patients wife states blister on foot has opened back up, they have not noticed any improvements with PT.  EVAL: Accompanied by spouse with pt consent. She does assist throughout with history as well. Arrives w/ ankle brace, knee band, and SPC. Notes over past few months R foot has started turning out more and arch has collapsed. Did have a wound on hallux which wife states has mostly healed at this point, today is obscured by gauze/bandage. Denies any N/T, some swelling which he states is chronic. Denies any significant issues with pain but does feel like his foot positioning is affecting his balance.  PERTINENT HISTORY: hx of basal cell carcinoma, HTN, GERD, hx craniotomy, brain injury, DM2 w/ foot ulcer, post tib tendinitis  PAIN:  Denies any pain in foot/ankle  PRECAUTIONS: fall risk, foot wound (R hallux IPJ, healing well per pt spouse)  RED FLAGS: None   WEIGHT BEARING RESTRICTIONS:  No  FALLS:  Has patient fallen in last 6 months? Yes. Number of falls 2-3 which they attribute to vertigo and ongoing balance issues  LIVING ENVIRONMENT: Using cane, lives w/ spouse 1 story home, 1 STE  OCCUPATION: retired - enjoys reading, playing bridge, hanging out with family  PLOF: Independent  PATIENT GOALS: work on IT trainer   NEXT MD VISIT: TBD  OBJECTIVE:  Note: Objective measures were completed at Evaluation unless otherwise noted.  DIAGNOSTIC FINDINGS:  R foot XR 11/25/23 per podiatry  note: Normal osseous mineralization.  Decreased calcaneal inclination angle and severely increased talar declination angle on lateral view.  Joint space narrowing throughout the midtarsal and tarsometatarsal joints.  No fracture seen   PATIENT SURVEYS:  LEFS: 57/80   COGNITION: Overall cognitive status: grossly WFL although full assessment limited by [facility]     EDEMA/INSPECTION:  Mild swelling noted about ankle joint. No significant erythema present. Brace is initially donned inverted with some bunching about medial aspect   POSTURE: noted reduction in R arch height, tendency to hold in ER with limited ability to correct   LOWER EXTREMITY ROM:  ROM Right eval Left eval Right 01/01/24 Left 01/01/24  Knee flexion      Knee extension      Ankle dorsiflexion 8 deg 10 deg 8 deg   Ankle plantarflexion      Ankle inversion 15 deg  18 deg    Ankle eversion 30 deg 20 deg     (Blank rows = not tested) (Key: WFL = within functional limits not formally assessed, * = concordant pain, s = stiffness/stretching sensation, NT = not tested) Comments:   LOWER EXTREMITY MMT:  MMT Right eval Left eval Right 01/01/24 Left 01/01/24  Knee flexion      Knee extension      Ankle dorsiflexion 4- 4+ NT NT  Ankle plantarflexion      Ankle inversion 4- 5 NT NT  Ankle eversion 4+ 5 NT NT   (Blank rows = not tested) (Key: WFL = within functional limits not formally assessed, * = concordant pain, s = stiffness/stretching sensation, NT = not tested) Comment:   EDEMA MEASUREMENT: 12/25/23: Lt 29cm, 31cm  FUNCTIONAL TESTS:  TUG: 13sec no AD mild instability   GAIT: Distance walked: within clinic Assistive device utilized: Single point cane Level of assistance: Modified independence Comments: R foot ER, increased trunk sway, limited truncal rotation                                                                                                                                TREATMENT DATE:   [facility] Adult PT Treatment:                                                DATE: 01/01/24  Therapeutic Exercise: Nustep level 5 x 5 mins while gathering subjective info and planning session with patient Therapeutic Activity: TUG 13 seconds MMT and goniometric measurements Discussion of goals, lack of progress with PT, returning to MD HEP update and review  [facility] Adult PT Treatment:                                                DATE: 12/25/23 Therapeutic Exercise: Nustep level 5 x 5 mins while gathering subjective info and planning session with patient Neuromuscular re-ed: Seated heel raises 2x10 Seated toe raises 2x10 Seated heel raises on 2 step with 5# KB above knee 2x10 BIL Seated inv/ev towel slides x20 ea Seated towel scrunches 2x1' BIL Toe yoga x1' Standing alternating cone taps with UE support 2x30 Therapeutic Activity: Standing heel raises 2x10 with UE support STS with light UE push 2x10   [facility] Adult PT Treatment:                                                DATE: 12/11/23 Therapeutic Exercise: Nustep level 5 x 5 mins while gathering subjective info and planning session with patient Neuromuscular re-ed: Seated heel raises 2x10 Seated toe raises 2x10 Seated heel raises on 2 step with 5# KB above knee 2x10 BIL Seated inv/ev towel slides x20 ea Seated towel scrunches 2x1' BIL Toe yoga x1' Standing alternating cone taps with UE support x30 Therapeutic Activity: Standing heel raises x10 with UE support STS with light UE push 2x10   PATIENT EDUCATION:  Education details: rationale for interventions, HEP  Person educated: Patient Education method: Explanation, Demonstration, Tactile cues, Verbal cues Education comprehension: verbalized understanding, returned demonstration, verbal cues required, tactile cues required, and needs further education     HOME EXERCISE PROGRAM: Access Code: [email or web address] URL: [email or web address] Date: 01/01/2024 Prepared by:  Corean Pouch  Program Notes DON'T USE THE LEG WITH TOWEL SLIDES, JUST MOVE THE ANKLE  Exercises - Ankle Inversion Eversion Towel Slide  - 2-3 x daily - 1 sets - 8-12 reps - Towel Scrunches  - 2-3 x daily - 1 sets - 8-12 reps - Seated Heel Toe Raises  - 2-3 x daily - 8-12 reps - Sit to Stand with Counter Support  - 2-3 x daily - 8-12 reps  ASSESSMENT:  CLINICAL IMPRESSION: Patient presents to PT reporting no current pain in his foot/ankle. Re-administered TUG and LEFS this session with patient making no or minimal improvements from evaluation. His DF ROM remains the same from evaluation. Unable to measure eversion/inversion as patient was unable to perform the motion without externally rotating the LE, even with manual feedback. When ASO brace was removed, notable increase in skin redness on medial side of ankle. Advised that patient wear a sock between brace and foot to reduce friction rubbing. After speaking with patient and wife about lack of progress thus far in PT, plus the return of the open wound on his big toe, we decided to pause PT at this time and return to MD for further evaluation.  EVAL: Patient is a pleasant 78 y.o. gentleman who was seen today for physical therapy evaluation and treatment for posterior tibialis issues. He denies any significant pain, but they endorse gradual increase in  instability and altered mechanics over past few months. Extensive fall history but reports increased difficulty with balance since onset. On exam he demonstrates reduced ankle ROM/strength on affected side and altered mechanics w/ gait/transfers. No red flags on exam today, education on improving fit with brace and monitoring skin integrity. No adverse events. Recommend trial of skilled PT to address aforementioned deficits with aim of improving functional tolerance and reducing pain with typical activities. Pt departs today's session in no acute distress, all voiced concerns/questions addressed  appropriately from PT perspective.     OBJECTIVE IMPAIRMENTS: Abnormal gait, decreased activity tolerance, decreased balance, decreased endurance, decreased mobility, difficulty walking, decreased ROM, decreased strength, impaired perceived functional ability, improper body mechanics, and postural dysfunction.   ACTIVITY LIMITATIONS: carrying, lifting, standing, stairs, transfers, and locomotion level  PARTICIPATION LIMITATIONS: community activity  PERSONAL FACTORS: Age, Time since onset of injury/illness/exacerbation, and 3+ comorbidities:  hx of basal cell carcinoma, HTN, GERD, hx craniotomy, brain injury, DM2 w/ foot ulcer, post tib tendinitis are also affecting patient's functional outcome.   REHAB POTENTIAL: Fair given comorbidities and chronicity  CLINICAL DECISION MAKING: Evolving/moderate complexity  EVALUATION COMPLEXITY: Moderate   GOALS:   SHORT TERM GOALS: Target date: 01/01/2024  Pt will demonstrate appropriate understanding and performance of initially prescribed HEP in order to facilitate improved independence with management of symptoms.  Baseline: HEP established  Goal status: MET Pt reports adherence 12/25/23   2. Pt will report at least 25% improvement in overall pain levels over past week in order to facilitate improved tolerance to typical daily activities.   Baseline: see NPS chart above  Goal status: MET 01/01/24: Patient reports no pain   LONG TERM GOALS: Target date: 01/15/2024  Pt will be able to perform TUG in less than or equal to 11 sec in order to indicate reduced risk of falling (cutoff score for fall risk 13.5 sec in community dwelling older adults per [facility] et al, 2000)  Baseline: 13sec no AD, mild instability  Goal status: NOT MET 01/01/24: 13 seconds    2.  Pt will demonstrate at least 4+/5 ankle MMT in tested groups for improved functional strength. Baseline: see MMT chart above Goal status: NOT MET See above chart and DC assessment  3.   Pt will demonstrate appropriate performance of final prescribed HEP in order to facilitate improved self-management of symptoms post-discharge.   Baseline: initial HEP prescribed  Goal status: MET 01/01/24: Provided, patient demos understanding    4.  Pt will demonstrate grossly symmetrical ankle inversion/DF AROM in order to facilitate improved functional mobility and walking mechanics. Baseline: see ROM chart above Goal status: NOT MET 01/01/24: See above chart and assessment    5. Pt will improve score at least 65/80 on LEFS in order to indicate improved perception of function d/t LE.   Baseline: 57/80  Goal status: NOT MET  01/01/24: 58/80   PLAN:  PT FREQUENCY: 1x/week  PT DURATION: 6 weeks  PLANNED INTERVENTIONS: 97164- PT Re-evaluation, 97750- Physical Performance Testing, 97110-Therapeutic exercises, 97530- Therapeutic activity, [id number]- Neuromuscular re-education, 97535- Self Care, 02859- Manual therapy, [id number]- Gait training, [phone] (1-2 muscles), 20561 (3+ muscles)- Dry Needling, Patient/Family education, Balance training, Stair training, Taping, Joint mobilization, Cryotherapy, and Moist heat  PLAN FOR NEXT SESSION: Review/update HEP PRN. Work on [employer] exercises as appropriate with emphasis on foot intrinsic/extrinsic activation, arch stability, post tib activation. Symptom modification strategies as indicated/appropriate.    Corean Pouch PTA  01/01/2024 1:50 PM

## 2024-01-01 NOTE — Progress Notes (Signed)
 Erroneous encounter

## 2024-01-02 NOTE — Telephone Encounter (Signed)
 Spoke to pt spouse and she advised that she wants the medication to go to [facility] only. No further action needed.

## 2024-01-06 ENCOUNTER — Ambulatory Visit: Admitting: Podiatry

## 2024-01-06 ENCOUNTER — Encounter: Payer: Self-pay | Admitting: Podiatry

## 2024-01-06 DIAGNOSIS — L97511 Non-pressure chronic ulcer of other part of right foot limited to breakdown of skin: Secondary | ICD-10-CM

## 2024-01-06 DIAGNOSIS — L03115 Cellulitis of right lower limb: Secondary | ICD-10-CM | POA: Diagnosis not present

## 2024-01-06 DIAGNOSIS — E11621 Type 2 diabetes mellitus with foot ulcer: Secondary | ICD-10-CM

## 2024-01-06 MED ORDER — DOXYCYCLINE HYCLATE 100 MG PO CAPS: 100.0000 mg | ORAL_CAPSULE | Freq: Two times a day (BID) | ORAL | 0 refills | Status: AC

## 2024-01-06 NOTE — Progress Notes (Unsigned)
 Chief Complaint  Patient presents with   Foot Pain    Had Blister developed beside wound/callus on R hallux plantar.  Diabetic A1c 7.1  no anti coag   HPI: 78 y.o. male presenting today with concern of a blister that has developed next to a thick callus underneath the right great toe.  Patient is not sure how this started but he does have a history of developing an ulcer in this area.  They do not recall seeing any significant drainage.  Denies fever, night sweats, nausea/vomiting  Past Medical History:  Diagnosis Date   Allergy    Basal cell carcinoma    GERD (gastroesophageal reflux disease)    History of kidney cancer    benign; told not cancerous   Hypertension    Past Surgical History:  Procedure Laterality Date   CATARACT EXTRACTION Bilateral    CRANIOTOMY Bilateral 12/25/2021   Procedure: CRANIOTOMY HEMATOMA EVACUATION SUBDURAL;  Surgeon: Louis Shove, MD;  Location: [facility] OR;  Service: Neurosurgery;  Laterality: Bilateral;   HERNIA REPAIR     x2   KIDNEY SURGERY Right    Had right kidney removed.    TONSILLECTOMY     VASECTOMY     No Known Allergies   PHYSICAL EXAM: General: The patient is alert and oriented x3 in no acute distress.  Dermatology: Skin is warm, dry and supple bilateral lower extremities. Interspaces are clear of maceration and debris.      Wound 1:  Location: Plantar right hallux IPJ area        Depth: Partial-thickness to dermis        Wound Border: Small, drained blister on the medial aspect of the ulceration with maceration        Wound Base: Macerated and granular        Drainage: Serosanguineous, small amount       Odor?:  None        Surrounding Tissue: No calor or edema but there is mild erythema locally        Infected?:  Possible early signs of cellulitis        Necrosis?:  No        Pain?:  No        Tunneling: None       Dimensions (cm): 1.0 x 0.6 x 0.2 cm (postdebridement)  Vascular: Pedal pulses are palpable  Neurological: Light  touch sensation diminished     Latest Ref Rng & Units 12/27/2023    9:38 AM 10/03/2023    9:13 AM 07/02/2023    7:41 AM  Hemoglobin A1C  Hemoglobin-A1c 4.6 - 6.5 % 7.1  7.0  7.0    ASSESSMENT / PLAN OF CARE: 1. Type 2 diabetes mellitus with foot ulcer (CODE) (HCC)   2. Chronic ulcer of right great toe, limited to breakdown of skin (HCC)   3. Cellulitis of right foot     Meds ordered this encounter  Medications   doxycycline  (VIBRAMYCIN ) 100 MG capsule    Sig: Take 1 capsule (100 mg total) by mouth 2 (two) times daily for 7 days.    Dispense:  14 capsule    Refill:  0   The ulceration was sharply debrided of hyperkeratotic and devitalized soft tissue with sterile #312 blade to the level of dermis.  Hemostasis obtained.  Antibiotic ointment and DSD applied.  Reviewed off-loading with patient.  Will continue with his old surgical shoe  Reviewed daily dressing changes with patient.  Apply antibiotic  ointment and gauze dressing daily.  Prescription doxycycline  100 mg 1 tablet p.o. twice daily x 7 days was sent to his pharmacy for the mild cellulitis.  Discussed risks / concerns regarding ulcer with patient and possible sequelae if left untreated.  Stressed importance of infection prevention at home. Short-term goals are: prevent/resolve infection, off-load ulcer, heal ulcer Long-term goals are:  prevent recurrence, prevent amputation.   Return in about 2 weeks (around 01/20/2024) for f/u ulcer.   Awanda CHARM Imperial, DPM, FACFAS [facility]     [address] [place], [place] [address]                Office [phone]  Fax [phone]

## 2024-01-27 ENCOUNTER — Ambulatory Visit: Admitting: Podiatry

## 2024-01-27 DIAGNOSIS — E11621 Type 2 diabetes mellitus with foot ulcer: Secondary | ICD-10-CM

## 2024-01-27 NOTE — Progress Notes (Unsigned)
 Chief Complaint  Patient presents with   Foot Ulcer    Right foot great toe plantar hallux. 1 pain. Using topical ointment.   HPI: 78 y.o. male presenting today for follow-up of plantar right hallux ulcer.  He is not sure if it is improving.  He and his wife still see a dark spot where the wound is.  He is not having significant pain to the area.  He is going to physical therapy for the right ankle/posterior tibial tendon dysfunction and does not feel he is having much improvement.  Past Medical History:  Diagnosis Date   Allergy    Basal cell carcinoma    GERD (gastroesophageal reflux disease)    History of kidney cancer    benign; told not cancerous   Hypertension    Past Surgical History:  Procedure Laterality Date   CATARACT EXTRACTION Bilateral    CRANIOTOMY Bilateral 12/25/2021   Procedure: CRANIOTOMY HEMATOMA EVACUATION SUBDURAL;  Surgeon: Louis Shove, MD;  Location: [facility] OR;  Service: Neurosurgery;  Laterality: Bilateral;   HERNIA REPAIR     x2   KIDNEY SURGERY Right    Had right kidney removed.    TONSILLECTOMY     VASECTOMY     No Known Allergies   PHYSICAL EXAM: General: The patient is alert and oriented x3 in no acute distress.  Dermatology: Skin is warm, dry and supple bilateral lower extremities. Interspaces are clear of maceration and debris.      Wound 1:  Location: Plantar right hallux        Depth: Closed        Wound Border: Hyperkeratotic        Wound Base: N/A        Drainage: None       Odor?:  None        Surrounding Tissue: No surrounding erythema or edema        Infected?:  No        Necrosis?:  No        Pain?:  No        Tunneling: No       Dimensions (cm): N/A   Vascular: Pedal pulses are palpable     Latest Ref Rng & Units 12/27/2023    9:38 AM 10/03/2023    9:13 AM 07/02/2023    7:41 AM  Hemoglobin A1C  Hemoglobin-A1c 4.6 - 6.5 % 7.1  7.0  7.0    ASSESSMENT / PLAN OF CARE: 1. Type 2 diabetes mellitus with foot ulcer (CODE)  (HCC)     The the hemorrhagic hyperkeratotic tissue was sharply debrided of hyperkeratotic and devitalized soft tissue with sterile #312 blade to the level of epidermis.  No underlying ulcer was revealed.  Hemostasis obtained.  Antibiotic ointment and DSD applied.  Reviewed off-loading with patient.  Patient can discontinue all dressings after today for this toe.  He needs to keep a close eye on the area for any signs of recurrence.  Patient informed that the skin appears intact after debridement of the hemorrhagic callus.  Discussed risks / concerns regarding ulcer with patient and possible sequelae if left untreated.  Stressed importance of infection prevention at home. Short-term goals are: prevent infection, off-load ulcer, heal ulcer Long-term goals are:  prevent recurrence, prevent amputation.   He is scheduled next week for nail care.  He would also like to discuss the right ankle issue at that time as well.  He feels that physical therapy  has not provided significant improvement and the prefab AFO brace is not providing enough support.   Awanda CHARM Imperial, DPM, FACFAS [facility]     [address] [place], [place] [address]                Office [phone]  Fax [phone]

## 2024-01-29 ENCOUNTER — Other Ambulatory Visit (HOSPITAL_COMMUNITY): Payer: Self-pay

## 2024-02-03 ENCOUNTER — Ambulatory Visit (INDEPENDENT_AMBULATORY_CARE_PROVIDER_SITE_OTHER)

## 2024-02-03 ENCOUNTER — Ambulatory Visit

## 2024-02-03 DIAGNOSIS — M79674 Pain in right toe(s): Secondary | ICD-10-CM | POA: Diagnosis not present

## 2024-02-03 DIAGNOSIS — M79675 Pain in left toe(s): Secondary | ICD-10-CM | POA: Diagnosis not present

## 2024-02-03 DIAGNOSIS — M76821 Posterior tibial tendinitis, right leg: Secondary | ICD-10-CM

## 2024-02-03 DIAGNOSIS — B351 Tinea unguium: Secondary | ICD-10-CM

## 2024-02-03 DIAGNOSIS — L97511 Non-pressure chronic ulcer of other part of right foot limited to breakdown of skin: Secondary | ICD-10-CM | POA: Diagnosis not present

## 2024-02-03 NOTE — Progress Notes (Signed)
 Chief Complaint  Patient presents with   [facility]     Pt stated that he is here to have his nails trimmed down    HPI: 78 y.o. male presenting today for follow-up of plantar right hallux ulcer.  He is not sure if it is improving.  He and his wife still see a dark spot where the wound is.  He is not having significant pain to the area.  He is going to physical therapy for the right ankle/posterior tibial tendon dysfunction and does not feel he is having much improvement.  Past Medical History:  Diagnosis Date   Allergy    Basal cell carcinoma    GERD (gastroesophageal reflux disease)    History of kidney cancer    benign; told not cancerous   Hypertension    Past Surgical History:  Procedure Laterality Date   CATARACT EXTRACTION Bilateral    CRANIOTOMY Bilateral 12/25/2021   Procedure: CRANIOTOMY HEMATOMA EVACUATION SUBDURAL;  Surgeon: Louis Shove, MD;  Location: [facility] OR;  Service: Neurosurgery;  Laterality: Bilateral;   HERNIA REPAIR     x2   KIDNEY SURGERY Right    Had right kidney removed.    TONSILLECTOMY     VASECTOMY     No Known Allergies   PHYSICAL EXAM: General: The patient is alert and oriented x3 in no acute distress.  Dermatology: Skin is warm, dry and supple bilateral lower extremities. Interspaces are clear of maceration and debris.  Nails 1 through 5 bilaterally are discolored, crumbly, dystrophic, thickened with subungual debris. Significant improvement to the wound underneath the right hallux.  Hyperkeratotic tissue present.  No signs of remaining ulcer deep to this, mild subdermal hematoma was noted.  Musculoskeletal: Right foot has significant posterior tibial tendinitis with increased pressure at the navicular tuberosity.  Minimal pain to palpation of the posterior tibial tendinitis, likely secondary to decreased neurologic sensation.  Deformity is mostly reducible.  Tri-Lock brace not holding him in rectus position.  Collapse of the medial longitudinal arch upon  standing.   Vascular: Pedal pulses are palpable  Radiographs: 3 weightbearing views of the right ankle were taken today.  These show a congruent valgus ankle deformity with adequate joint space remaining.  The lateral image demonstrates complete collapse of the medial longitudinal arch, centered at the TN and Mar[place] joints.  Minor arthritic development of the subtalar joint.     Latest Ref Rng & Units 12/27/2023    9:38 AM 10/03/2023    9:13 AM 07/02/2023    7:41 AM  Hemoglobin A1C  Hemoglobin-A1c 4.6 - 6.5 % 7.1  7.0  7.0    ASSESSMENT / PLAN OF CARE: 1. Posterior tibial tendinitis, right   2. Pain due to onychomycosis of toenails of both feet   3. Chronic ulcer of right great toe, limited to breakdown of skin (HCC)     The the hemorrhagic hyperkeratotic tissue was sharply debrided of hyperkeratotic and devitalized soft tissue with sterile #312 blade to the level of epidermis.  No underlying ulcer was revealed.  Hemostasis obtained.  Antibiotic ointment and DSD applied.  Reviewed off-loading with patient.  Patient can discontinue all dressings after today for this toe.  He needs to keep a close eye on the area for any signs of recurrence.  Patient informed that the skin appears intact after debridement of the hemorrhagic callus.  Discussed risks / concerns regarding ulcer with patient and possible sequelae if left untreated.  Stressed importance of infection prevention  at home. Short-term goals are: prevent infection, off-load ulcer, heal ulcer Long-term goals are:  prevent recurrence, prevent amputation.   Nails 1 through 5 bilaterally were sharply debrided with nail nippers without complication.  Patient tolerated this well and expressed satisfaction following the procedure.  For the patient's right foot and ankle posterior tibial tendinitis, I do believe that he needs more support than the Tri-Lock brace is offering him.  Due to difficult to reduce deformity, I do believe that he would be  best served by an [place]  brace.  He and his wife expressed understanding of this and will book an appointment with Trish for an [place]  brace to assist in his posterior tibial tendinitis, helping to hold him in a rectus position. I believe this will help to offload the medial hallux ulcer.   RTC for appointment with Trish, and 3 months for [facility]  Prentice Ovens, DPM [facility]     [address] [place], [place] [address]                Office [phone]  Fax [phone]

## 2024-02-12 ENCOUNTER — Ambulatory Visit: Admitting: Family Medicine

## 2024-02-12 VITALS — BP 126/74 | HR 73 | Ht 78.0 in | Wt 236.0 lb

## 2024-02-12 DIAGNOSIS — R201 Hypoesthesia of skin: Secondary | ICD-10-CM

## 2024-02-12 DIAGNOSIS — R269 Unspecified abnormalities of gait and mobility: Secondary | ICD-10-CM | POA: Diagnosis not present

## 2024-02-12 DIAGNOSIS — R296 Repeated falls: Secondary | ICD-10-CM | POA: Diagnosis not present

## 2024-02-12 NOTE — Progress Notes (Signed)
   LILLETTE Ileana Collet, PhD, LAT, ATC acting as a scribe for Artist Lloyd, MD.  Brandon [name] is a 78 y.o. male who presents to [facility] at [facility] today for recurrent falls. He notes hx of a brain bleed Aug 2023 resulting in craniotomy. He c/o cont'd balance issues that varies day-to-day. R foot deformity.   Treatments tried: extensive PT, cane  Pertinent review of systems: No fevers or chills  Relevant historical information: Diabetes.  History of subdural hematoma.  Impaired balance.   Exam:  BP 126/74   Pulse 73   Ht 6' 6 (1.981 m)   Wt 236 lb (107 kg)   SpO2 97%   BMI 27.27 kg/m  General: Well Developed, well nourished, and in no acute distress.   MSK: Right foot ankle pronation and subluxation is present.  Patient does have intact lower extremity strength. Reflexes are mildly diminished.  Sensation is mildly diminished.  Patient has absent monofilament sensation both feet.  Pulses and capillary fill are intact.  Gait: Patient does not do wall touching or furniture touching when he rotates while standing.  Otherwise he has a reasonably fluent gait.    Lab and Radiology Results No results found for this or any previous visit (from the past 72 hours). No results found.     Assessment and Plan: 78 y.o. male with impaired gait and impaired balance following brain injury from subdural hematoma few years ago.  Additionally it is complicated by his ankle deformity and presumed neuropathy lower extremities.  Plan to refer to neuro PT/rehab for work with gait and balance.  Additionally will arrange for nerve conduction study to clarify impaired sensation in his lower extremities.  Recheck after we get the nerve conduction study back.   PDMP not reviewed this encounter. Orders Placed This Encounter  Procedures   Ambulatory referral to Physical Therapy    Referral Priority:   Routine    Referral Type:   Physical Medicine    Referral Reason:    Specialty Services Required    Requested Specialty:   Physical Therapy    Number of Visits Requested:   1   NCV with EMG(electromyography)    Standing Status:   Future    Expiration Date:   02/11/2025    Where should this test be performed?:   [facility]   No orders of the defined types were placed in this encounter.    Discussed warning signs or symptoms. Please see discharge instructions. Patient expresses understanding.   The above documentation has been reviewed and is accurate and complete Artist Lloyd, M.D.

## 2024-02-12 NOTE — Patient Instructions (Addendum)
 Thank you for coming in today.   A referral for physical therapy has been submitted. A representative from the physical therapy office will contact you to coordinate scheduling after confirming your benefits with your insurance provider. If you do not hear from the physical therapy office within the next 1-2 weeks, please let us  know.   We've placed an order for a nerve conduction study with [facility].   See you back to review results in person.

## 2024-02-18 NOTE — Progress Notes (Signed)
   02/18/2024  Patient ID: Brandon [name], male   DOB: [birth year], 78 y.o.   MRN: [id number]  Pharmacy Quality Measure Review  This patient is appearing on a report for being at risk of failing the adherence measure for diabetes medications this calendar year.   Medication: Metformin  Last fill date: 02/17/24 for 90 day supply  Insurance report was not up to date. No action needed at this time.   [name], PharmD Clinical Pharmacist [phone]

## 2024-02-20 ENCOUNTER — Ambulatory Visit

## 2024-02-20 DIAGNOSIS — L97511 Non-pressure chronic ulcer of other part of right foot limited to breakdown of skin: Secondary | ICD-10-CM

## 2024-02-20 DIAGNOSIS — M2141 Flat foot [pes planus] (acquired), right foot: Secondary | ICD-10-CM

## 2024-02-20 DIAGNOSIS — M76821 Posterior tibial tendinitis, right leg: Secondary | ICD-10-CM

## 2024-02-20 DIAGNOSIS — E11621 Type 2 diabetes mellitus with foot ulcer: Secondary | ICD-10-CM

## 2024-02-25 ENCOUNTER — Ambulatory Visit: Admitting: Podiatry

## 2024-02-26 ENCOUNTER — Other Ambulatory Visit: Payer: Self-pay | Admitting: Adult Health

## 2024-02-26 DIAGNOSIS — E782 Mixed hyperlipidemia: Secondary | ICD-10-CM

## 2024-02-26 MED ORDER — ATORVASTATIN CALCIUM 20 MG PO TABS: 20.0000 mg | ORAL_TABLET | Freq: Every day | ORAL | 3 refills | Status: AC

## 2024-03-06 NOTE — Progress Notes (Signed)
 Patient was seen and casted for right [place]  brace  Patient is diabetic and has ulcerated area at medial great toe PTT and arch collapse w over pronation causing added pressure to wound area  Brace will help correct and offload wound area, stabilize ankle and decrease over pronation and help to promote healing of wound and increase mobility, while reducing risk of injury by fall  [employer] active no ded / 20% coins   Brace will be ordered and fit when in

## 2024-03-16 ENCOUNTER — Encounter: Payer: Self-pay | Admitting: Radiology

## 2024-03-25 ENCOUNTER — Emergency Department (HOSPITAL_BASED_OUTPATIENT_CLINIC_OR_DEPARTMENT_OTHER)
Admission: EM | Admit: 2024-03-25 | Discharge: 2024-03-25 | Disposition: A | Discharge status: Home / Self Care | Attending: Emergency Medicine | Admitting: Emergency Medicine

## 2024-03-25 ENCOUNTER — Other Ambulatory Visit: Payer: Self-pay

## 2024-03-25 ENCOUNTER — Encounter (HOSPITAL_BASED_OUTPATIENT_CLINIC_OR_DEPARTMENT_OTHER): Payer: Self-pay | Admitting: Emergency Medicine

## 2024-03-25 DIAGNOSIS — Z8546 Personal history of malignant neoplasm of prostate: Secondary | ICD-10-CM | POA: Insufficient documentation

## 2024-03-25 DIAGNOSIS — E1165 Type 2 diabetes mellitus with hyperglycemia: Secondary | ICD-10-CM | POA: Diagnosis not present

## 2024-03-25 DIAGNOSIS — I10 Essential (primary) hypertension: Secondary | ICD-10-CM | POA: Diagnosis not present

## 2024-03-25 DIAGNOSIS — R339 Retention of urine, unspecified: Secondary | ICD-10-CM | POA: Diagnosis not present

## 2024-03-25 LAB — BASIC METABOLIC PANEL WITH GFR
Anion gap: 9 (ref 5–15)
BUN: 15 mg/dL (ref 8–23)
CO2: 27 mmol/L (ref 22–32)
Calcium: 9.2 mg/dL (ref 8.9–10.3)
Chloride: 103 mmol/L (ref 98–111)
Creatinine, Ser: 0.83 mg/dL (ref 0.61–1.24)
GFR, Estimated: >60 mL/min (ref >60)
Glucose, Bld: 177 mg/dL — ABNORMAL HIGH (ref 70–99)
Potassium: 3.3 mmol/L — ABNORMAL LOW (ref 3.5–5.1)
Sodium: 139 mmol/L (ref 135–145)

## 2024-03-25 LAB — URINALYSIS, W/ REFLEX TO CULTURE (INFECTION SUSPECTED)
Bacteria, UA: NONE SEEN
Bilirubin Urine: NEGATIVE
Glucose, UA: NEGATIVE mg/dL
Ketones, ur: NEGATIVE mg/dL
Leukocytes,Ua: NEGATIVE
Nitrite: NEGATIVE
Protein, ur: NEGATIVE mg/dL
Specific Gravity, Urine: 1.013 (ref 1.005–1.030)
pH: 6.5 (ref 5.0–8.0)

## 2024-03-25 LAB — CBC WITH DIFFERENTIAL/PLATELET
Abs Immature Granulocytes: 0.02 K/uL (ref 0.00–0.07)
Basophils Absolute: 0.1 K/uL (ref 0.0–0.1)
Basophils Relative: 1 %
Eosinophils Absolute: 0.1 K/uL (ref 0.0–0.5)
Eosinophils Relative: 3 %
HCT: 40.3 % (ref 39.0–52.0)
Hemoglobin: 14.2 g/dL (ref 13.0–17.0)
Immature Granulocytes: 0 %
Lymphocytes Relative: 16 %
Lymphs Abs: 0.9 K/uL (ref 0.7–4.0)
MCH: 30.6 pg (ref 26.0–34.0)
MCHC: 35.2 g/dL (ref 30.0–36.0)
MCV: 86.9 fL (ref 80.0–100.0)
Monocytes Absolute: 0.4 K/uL (ref 0.1–1.0)
Monocytes Relative: 8 %
Neutro Abs: 4.1 K/uL (ref 1.7–7.7)
Neutrophils Relative %: 72 %
Platelets: 130 K/uL — ABNORMAL LOW (ref 150–400)
RBC: 4.64 MIL/uL (ref 4.22–5.81)
RDW: 13.1 % (ref 11.5–15.5)
WBC: 5.7 K/uL (ref 4.0–10.5)
nRBC: 0 % (ref 0.0–0.2)

## 2024-03-25 MED ORDER — TAMSULOSIN HCL 0.4 MG PO CAPS: 0.4000 mg | ORAL_CAPSULE | Freq: Every day | ORAL | 0 refills | Status: AC

## 2024-03-25 NOTE — ED Notes (Signed)
 Pt instructed on how to change the drainage bags from the leg bag to the standard drainage bag. Pt and wife were there for demonstration and was allowed time to ask questions if needed.

## 2024-03-25 NOTE — ED Provider Notes (Signed)
 [place] EMERGENCY DEPARTMENT AT [facility] Provider Note   CSN: [id number] Arrival date & time: 03/25/24  9191     Patient presents with: Urinary Retention   Brandon [name] is a 78 y.o. male.   Patient is a 78 year old male who presents with difficulty urinating.  He woke up about 2 AM this morning with inability to urinate.  He tried multiple times.  He has had some increased pressure in his lower abdomen.  He has a history of prostate cancer that is being managed conservatively by Dr. Alvaro with [facility].  He said he had this happen 1 time in the past a few years ago.  He denies any fevers.  No nausea or vomiting.       Prior to Admission medications   Medication Sig Start Date End Date Taking? Authorizing Provider  tamsulosin  (FLOMAX ) 0.4 MG CAPS capsule Take 1 capsule (0.4 mg total) by mouth daily. 03/25/24  Yes Lenor Hollering, MD  atorvastatin  (LIPITOR) 20 MG tablet Take 1 tablet (20 mg total) by mouth daily. 02/26/24   Nafziger, Darleene, NP  cetirizine (ZYRTEC) 10 MG tablet Take 10 mg by mouth daily.    [provider]  Cholecalciferol  25 MCG (1000 UT) tablet Take 1 tablet (1,000 Units total) by mouth daily. 01/22/22   Angiulli, Toribio PARAS, PA-C  diclofenac  Sodium (VOLTAREN ) 1 % GEL Apply 4 g topically 3 (three) times daily. 01/22/22   Angiulli, Toribio PARAS, PA-C  fluorouracil (EFUDEX) 5 % cream Apply topically 2 (two) times daily. 08/01/23   [provider]  lisinopril -hydrochlorothiazide  (ZESTORETIC ) 20-25 MG tablet Take 1 tablet by mouth daily. 12/27/23   Nafziger, Darleene, NP  meclizine  (ANTIVERT ) 25 MG tablet Take 1 tablet (25 mg total) by mouth 3 (three) times daily as needed. 07/24/23   Nafziger, Darleene, NP  metFORMIN  (GLUCOPHAGE -XR) 500 MG 24 hr tablet Take 1 tablet (500 mg total) by mouth daily with breakfast. 10/03/23   Merna Darleene, NP    Allergies: Patient has no known allergies.    Review of Systems  Constitutional:  Negative for fever.   Gastrointestinal:  Positive for abdominal pain. Negative for nausea and vomiting.  Genitourinary:  Positive for decreased urine volume, difficulty urinating and frequency. Negative for flank pain, penile pain, scrotal swelling and testicular pain.  Skin:  Negative for wound.  Neurological:  Negative for headaches.    Updated Vital Signs BP (!) 146/77   Pulse 63   Temp 98.7 F (37.1 C) (Oral)   Resp 18   SpO2 93%   Physical Exam Constitutional:      Appearance: He is well-developed.  HENT:     Head: Normocephalic and atraumatic.  Eyes:     Pupils: Pupils are equal, round, and reactive to light.  Cardiovascular:     Rate and Rhythm: Normal rate and regular rhythm.     Heart sounds: Normal heart sounds.  Pulmonary:     Effort: Pulmonary effort is normal. No respiratory distress.     Breath sounds: Normal breath sounds. No wheezing or rales.  Chest:     Chest wall: No tenderness.  Abdominal:     General: Bowel sounds are normal.     Palpations: Abdomen is soft.     Tenderness: There is no abdominal tenderness. There is no guarding or rebound.  Musculoskeletal:        General: Normal range of motion.     Cervical back: Normal range of motion and neck supple.  Lymphadenopathy:  Cervical: No cervical adenopathy.  Skin:    General: Skin is warm and dry.     Findings: No rash.  Neurological:     Mental Status: He is alert and oriented to person, place, and time.     (all labs ordered are listed, but only abnormal results are displayed) Labs Reviewed  URINALYSIS, W/ REFLEX TO CULTURE (INFECTION SUSPECTED) - Abnormal; Notable for the following components:      Result Value   Hgb urine dipstick SMALL (*)    All other components within normal limits  BASIC METABOLIC PANEL WITH GFR - Abnormal; Notable for the following components:   Potassium 3.3 (*)    Glucose, Bld 177 (*)    All other components within normal limits  CBC WITH DIFFERENTIAL/PLATELET - Abnormal; Notable  for the following components:   Platelets 130 (*)    All other components within normal limits    EKG: None  Radiology: No results found.   Procedures   Medications Ordered in the ED - No data to display                                  Medical Decision Making Amount and/or Complexity of Data Reviewed Labs: ordered.  Risk Prescription drug management.   This patient presents to the ED for concern of difficulty urinating, this involves an extensive number of treatment options, and is a complaint that carries with it a high risk of complications and morbidity.  I considered the following differential and admission for this acute, potentially life threatening condition.  The differential diagnosis includes urinary retention, prostatitis, UTI, acute renal failure  MDM:    Patient is a 78 year old who presents with some difficulty emptying his bladder since early this morning.  He is not febrile.  Labs reviewed and are nonconcerning.  His glucose is mildly elevated but he does have a history of diabetes.  Advised him to monitor this.  Urine is not concerning with for infection.  Bladder scan showed about 400 cc of urine.  He had a Foley catheter placed that resolved his symptoms.  It is draining well.  He was discharged home in good condition.  Will start him back on Flomax .  He was previously on it but did not think it was working so he stopped it.  He has an appointment next week with urology for a scheduled follow-up.  Return precautions were given.  (Labs, imaging, consults)  Labs: I Ordered, and personally interpreted labs.  The pertinent results include: Urine not concerning for infection, mild hyperglycemia  Imaging Studies ordered: I ordered imaging studies including   I independently visualized and interpreted imaging. I agree with the radiologist interpretation  Additional history obtained from wife at bedside.  External records from outside source obtained and  reviewed including history  Cardiac Monitoring: The patient was not maintained on a cardiac monitor.  If on the cardiac monitor, I personally viewed and interpreted the cardiac monitored which showed an underlying rhythm of:    Reevaluation: After the interventions noted above, I reevaluated the patient and found that they have :improved  Social Determinants of Health:    Disposition: Discharged to home  Co morbidities that complicate the patient evaluation  Past Medical History:  Diagnosis Date   Allergy    Basal cell carcinoma    GERD (gastroesophageal reflux disease)    History of kidney cancer    benign; told  not cancerous   Hypertension      Medicines Meds ordered this encounter  Medications   tamsulosin  (FLOMAX ) 0.4 MG CAPS capsule    Sig: Take 1 capsule (0.4 mg total) by mouth daily.    Dispense:  20 capsule    Refill:  0    I have reviewed the patients home medicines and have made adjustments as needed  Problem List / ED Course: Problem List Items Addressed This Visit   None Visit Diagnoses       Urinary retention    -  Primary                Final diagnoses:  Urinary retention    ED Discharge Orders          Ordered    tamsulosin  (FLOMAX ) 0.4 MG CAPS capsule  Daily        03/25/24 0957               Lenor Hollering, MD 03/25/24 1000

## 2024-03-25 NOTE — ED Triage Notes (Signed)
 Reports urinary rentention since 0300 this morning. Hx of similar episode two years ago. Has prostate cancer.

## 2024-03-25 NOTE — Discharge Instructions (Signed)
 Follow-up with Dr. Alvaro next week as scheduled.  Return to the emergency room if you have any worsening symptoms.

## 2024-03-31 DIAGNOSIS — C61 Malignant neoplasm of prostate: Secondary | ICD-10-CM | POA: Diagnosis not present

## 2024-03-31 DIAGNOSIS — R339 Retention of urine, unspecified: Secondary | ICD-10-CM | POA: Diagnosis not present

## 2024-03-31 NOTE — Progress Notes (Signed)
   03/31/2024  Patient ID: Brandon [name], male   DOB: 11/27/1945, 78 y.o.   MRN: [id number]  Pharmacy Quality Measure Review  This patient is appearing on a report for being at risk of failing the adherence measure for cholesterol (statin) and hypertension (ACEi/ARB) medications this calendar year.   Medication: Lisinopril -HCTZ Last fill date: 03/26/24 for 90 day supply  Medication: Atorvastatin  Last fill date: 03/04/24 for 90 day supply   Insurance report was not up to date. No action needed at this time.   [name], PharmD Clinical Pharmacist [phone]

## 2024-04-06 ENCOUNTER — Emergency Department (HOSPITAL_BASED_OUTPATIENT_CLINIC_OR_DEPARTMENT_OTHER)
Admission: EM | Admit: 2024-04-06 | Discharge: 2024-04-06 | Disposition: A | Discharge status: Home / Self Care | Attending: Emergency Medicine | Admitting: Emergency Medicine

## 2024-04-06 ENCOUNTER — Encounter (HOSPITAL_BASED_OUTPATIENT_CLINIC_OR_DEPARTMENT_OTHER): Payer: Self-pay

## 2024-04-06 ENCOUNTER — Emergency Department (HOSPITAL_BASED_OUTPATIENT_CLINIC_OR_DEPARTMENT_OTHER)
Admission: EM | Admit: 2024-04-06 | Discharge: 2024-04-06 | Disposition: A | Discharge status: Home / Self Care | Source: Home / Self Care | Attending: Emergency Medicine | Admitting: Emergency Medicine

## 2024-04-06 ENCOUNTER — Other Ambulatory Visit: Payer: Self-pay

## 2024-04-06 DIAGNOSIS — Z85528 Personal history of other malignant neoplasm of kidney: Secondary | ICD-10-CM | POA: Diagnosis not present

## 2024-04-06 DIAGNOSIS — Z79899 Other long term (current) drug therapy: Secondary | ICD-10-CM | POA: Insufficient documentation

## 2024-04-06 DIAGNOSIS — Y732 Prosthetic and other implants, materials and accessory gastroenterology and urology devices associated with adverse incidents: Secondary | ICD-10-CM | POA: Insufficient documentation

## 2024-04-06 DIAGNOSIS — I10 Essential (primary) hypertension: Secondary | ICD-10-CM | POA: Diagnosis not present

## 2024-04-06 DIAGNOSIS — Z85828 Personal history of other malignant neoplasm of skin: Secondary | ICD-10-CM | POA: Diagnosis not present

## 2024-04-06 DIAGNOSIS — T83098A Other mechanical complication of other indwelling urethral catheter, initial encounter: Secondary | ICD-10-CM | POA: Diagnosis not present

## 2024-04-06 DIAGNOSIS — T83091A Other mechanical complication of indwelling urethral catheter, initial encounter: Secondary | ICD-10-CM | POA: Diagnosis not present

## 2024-04-06 DIAGNOSIS — R319 Hematuria, unspecified: Secondary | ICD-10-CM | POA: Insufficient documentation

## 2024-04-06 DIAGNOSIS — R310 Gross hematuria: Secondary | ICD-10-CM | POA: Diagnosis not present

## 2024-04-06 LAB — CBC
HCT: 39.6 % (ref 39.0–52.0)
Hemoglobin: 13.7 g/dL (ref 13.0–17.0)
MCH: 30 pg (ref 26.0–34.0)
MCHC: 34.6 g/dL (ref 30.0–36.0)
MCV: 86.8 fL (ref 80.0–100.0)
Platelets: 203 K/uL (ref 150–400)
RBC: 4.56 MIL/uL (ref 4.22–5.81)
RDW: 12.7 % (ref 11.5–15.5)
WBC: 10.9 K/uL — ABNORMAL HIGH (ref 4.0–10.5)
nRBC: 0 % (ref 0.0–0.2)

## 2024-04-06 LAB — CBC WITH DIFFERENTIAL/PLATELET
Abs Immature Granulocytes: 0.02 K/uL (ref 0.00–0.07)
Basophils Absolute: 0.1 K/uL (ref 0.0–0.1)
Basophils Relative: 1 %
Eosinophils Absolute: 0.1 K/uL (ref 0.0–0.5)
Eosinophils Relative: 1 %
HCT: 40 % (ref 39.0–52.0)
Hemoglobin: 14 g/dL (ref 13.0–17.0)
Immature Granulocytes: 0 %
Lymphocytes Relative: 12 %
Lymphs Abs: 0.7 K/uL (ref 0.7–4.0)
MCH: 30.6 pg (ref 26.0–34.0)
MCHC: 35 g/dL (ref 30.0–36.0)
MCV: 87.5 fL (ref 80.0–100.0)
Monocytes Absolute: 0.3 K/uL (ref 0.1–1.0)
Monocytes Relative: 6 %
Neutro Abs: 4.8 K/uL (ref 1.7–7.7)
Neutrophils Relative %: 80 %
Platelets: 177 K/uL (ref 150–400)
RBC: 4.57 MIL/uL (ref 4.22–5.81)
RDW: 12.8 % (ref 11.5–15.5)
WBC: 5.9 K/uL (ref 4.0–10.5)
nRBC: 0 % (ref 0.0–0.2)

## 2024-04-06 LAB — BASIC METABOLIC PANEL WITH GFR
Anion gap: 11 (ref 5–15)
Anion gap: 8 (ref 5–15)
BUN: 20 mg/dL (ref 8–23)
BUN: 15 mg/dL (ref 8–23)
CO2: 27 mmol/L (ref 22–32)
CO2: 27 mmol/L (ref 22–32)
Calcium: 10.2 mg/dL (ref 8.9–10.3)
Calcium: 10.2 mg/dL (ref 8.9–10.3)
Chloride: 101 mmol/L (ref 98–111)
Chloride: 104 mmol/L (ref 98–111)
Creatinine, Ser: 0.91 mg/dL (ref 0.61–1.24)
Creatinine, Ser: 0.86 mg/dL (ref 0.61–1.24)
GFR, Estimated: >60 mL/min (ref >60)
GFR, Estimated: >60 mL/min (ref >60)
Glucose, Bld: 217 mg/dL — ABNORMAL HIGH (ref 70–99)
Glucose, Bld: 166 mg/dL — ABNORMAL HIGH (ref 70–99)
Potassium: 3.8 mmol/L (ref 3.5–5.1)
Potassium: 3.8 mmol/L (ref 3.5–5.1)
Sodium: 139 mmol/L (ref 135–145)
Sodium: 140 mmol/L (ref 135–145)

## 2024-04-06 MED ORDER — SODIUM CHLORIDE 0.9 % IR SOLN
1000.0000 mL | Status: DC
  Administered 2024-04-06: 1000 mL
  Filled 2024-04-06: qty 1000

## 2024-04-06 NOTE — ED Triage Notes (Signed)
 Pt to exam 13 c/o Urinay Catheter leaking x 1 day. Pt had Cath placed 1 week ago and states having bloody urine daily. Bladder scanner . Pt has Hx of prostate cancer. VSS NAD PT on room air, denies fever or chills.

## 2024-04-06 NOTE — ED Notes (Signed)
 Pt d/c instructions, catheter care, medications, and follow-up care reviewed with pt and wife. Pt and wife verbalized understanding and had no further questions at time of d/c. Pt CA&Ox4, ambulatory, and in NAD at time of d/c

## 2024-04-06 NOTE — ED Provider Notes (Signed)
 [place] EMERGENCY DEPARTMENT AT [facility] Provider Note   CSN: [id number] Arrival date & time: 04/06/24  9749     Patient presents with: Urinary Catheter problem   Brandon [name] is a 78 y.o. male.   HPI     This is an 78 year old male who presents with concern for urinary catheter blockage.  Patient had catheter placed for urinary retention on 11/12.  He followed up with urology and was told he would need to keep his catheter until early December.  He has noted bloody urine since the time of catheter placement.  He is not on any blood thinners.  Over the last 24 hours had decreased urinary output and leakage around the catheter.  States that he has had leakage into his briefs.  Prior to Admission medications   Medication Sig Start Date End Date Taking? Authorizing Provider  atorvastatin  (LIPITOR) 20 MG tablet Take 1 tablet (20 mg total) by mouth daily. 02/26/24   Nafziger, Darleene, NP  cetirizine (ZYRTEC) 10 MG tablet Take 10 mg by mouth daily.    [provider]  Cholecalciferol  25 MCG (1000 UT) tablet Take 1 tablet (1,000 Units total) by mouth daily. 01/22/22   Angiulli, Daniel J, PA-C  diclofenac  Sodium (VOLTAREN ) 1 % GEL Apply 4 g topically 3 (three) times daily. 01/22/22   Angiulli, Toribio PARAS, PA-C  fluorouracil (EFUDEX) 5 % cream Apply topically 2 (two) times daily. 08/01/23   [provider]  lisinopril -hydrochlorothiazide  (ZESTORETIC ) 20-25 MG tablet Take 1 tablet by mouth daily. 12/27/23   Nafziger, Darleene, NP  meclizine  (ANTIVERT ) 25 MG tablet Take 1 tablet (25 mg total) by mouth 3 (three) times daily as needed. 07/24/23   Nafziger, Darleene, NP  metFORMIN  (GLUCOPHAGE -XR) 500 MG 24 hr tablet Take 1 tablet (500 mg total) by mouth daily with breakfast. 10/03/23   Nafziger, Darleene, NP  tamsulosin  (FLOMAX ) 0.4 MG CAPS capsule Take 1 capsule (0.4 mg total) by mouth daily. 03/25/24   Lenor Hollering, MD    Allergies: Patient has no known allergies.    Review of  Systems  Constitutional:  Negative for fever.  Respiratory:  Negative for shortness of breath.   Cardiovascular:  Negative for chest pain.  Genitourinary:  Positive for hematuria.  All other systems reviewed and are negative.   Updated Vital Signs BP (!) 172/105 (BP Location: Right Arm)   Pulse 82   Temp 98 F (36.7 C) (Oral)   Resp 17   Ht 1.981 m (6' 6)   Wt 107 kg   SpO2 96%   BMI 27.26 kg/m   Physical Exam Vitals and nursing note reviewed.  Constitutional:      Appearance: He is well-developed. He is not ill-appearing.  HENT:     Head: Normocephalic and atraumatic.  Eyes:     Pupils: Pupils are equal, round, and reactive to light.  Cardiovascular:     Rate and Rhythm: Normal rate and regular rhythm.  Pulmonary:     Effort: Pulmonary effort is normal. No respiratory distress.  Abdominal:     Palpations: Abdomen is soft.     Tenderness: There is no abdominal tenderness. There is no rebound.  Genitourinary:    Comments: Foley catheter in place, grossly bloody urine noted in the bag Musculoskeletal:     Cervical back: Neck supple.  Lymphadenopathy:     Cervical: No cervical adenopathy.  Skin:    General: Skin is warm and dry.  Neurological:     Mental Status:  He is alert and oriented to person, place, and time.  Psychiatric:        Mood and Affect: Mood normal.     (all labs ordered are listed, but only abnormal results are displayed) Labs Reviewed  BASIC METABOLIC PANEL WITH GFR - Abnormal; Notable for the following components:      Result Value   Glucose, Bld 217 (*)    All other components within normal limits  CBC WITH DIFFERENTIAL/PLATELET    EKG: None  Radiology: No results found.   Procedures   Medications Ordered in the ED - No data to display  Clinical Course as of 04/06/24 0553  [facility] Apr 06, 2024  9658 16 French catheter replaced.  Patient noted to have blood clot.  Initially drained well.  However became clogged again.  Requested  three-way catheter placement with a larger catheter. [CH]    Clinical Course User Index [CH] Jessicalynn Deshong, Charmaine FALCON, MD                                 Medical Decision Making Amount and/or Complexity of Data Reviewed Labs: ordered.   This patient presents to the ED for concern of Foley catheter concern, this involves an extensive number of treatment options, and is a complaint that carries with it a high risk of complications and morbidity.  I considered the following differential and admission for this acute, potentially life threatening condition.  The differential diagnosis includes blockage, gross hematuria, clot  MDM:    This is a 78 year old male who presents with concern for catheter malfunction.  Reports that he is urinating around the catheter and it has not been draining.  States that since the catheter was placed he has had fairly gross hematuria.  Has seen urology since that time and catheter was to remain in place until December.  He has some retained urine in his bladder.  Unable to irrigate Foley.  See clinical course above.  Initially was replaced with a 16 French catheter that again became blocked.  Requested a three-way catheter placed.  This was irrigated with a liter of saline with good improvement of the patient's symptoms and good evacuation of the bladder.  Labs obtained.  No anemia and otherwise reassuring.  On recheck, urine in bladder remains grossly blood-tinged but no large clots.  Will have him follow-up closely with urology.  (Labs, imaging, consults)  Labs: I Ordered, and personally interpreted labs.  The pertinent results include: CBC, BMP  Imaging Studies ordered: I ordered imaging studies including none I independently visualized and interpreted imaging. I agree with the radiologist interpretation  Additional history obtained from chart review.  External records from outside source obtained and reviewed including prior evaluations  Cardiac Monitoring: The  patient was not maintained on a cardiac monitor.  If on the cardiac monitor, I personally viewed and interpreted the cardiac monitored which showed an underlying rhythm of:  N/A Reevaluation: After the interventions noted above, I reevaluated the patient and found that they have :resolved  Social Determinants of Health:  lives independently  Disposition: Discharge  Co morbidities that complicate the patient evaluation  Past Medical History:  Diagnosis Date   Allergy    Basal cell carcinoma    GERD (gastroesophageal reflux disease)    History of kidney cancer    benign; told not cancerous   Hypertension      Medicines No orders of the defined types  were placed in this encounter.   I have reviewed the patients home medicines and have made adjustments as needed  Problem List / ED Course: Problem List Items Addressed This Visit   None Visit Diagnoses       Gross hematuria    -  Primary     Obstruction of urinary catheter, initial encounter                    Final diagnoses:  Gross hematuria  Obstruction of urinary catheter, initial encounter    ED Discharge Orders     None          Bari Charmaine FALCON, MD 04/06/24 [phone]

## 2024-04-06 NOTE — ED Notes (Signed)
 Bladder irrigation completed using 1L NS with strict sterile techniques. PT tolerated well.

## 2024-04-06 NOTE — ED Notes (Signed)
 Pt tolerating bladder irrigation appropriately at this time. Urine is running clear to pink tinged with some blood clots still passing

## 2024-04-06 NOTE — Discharge Instructions (Signed)
 You were seen today for blocked Foley catheter by blood.  Follow-up closely with urology.  Keep Foley catheter in place.  If you note that it is no longer draining, you will need to be reseen.

## 2024-04-06 NOTE — ED Provider Notes (Signed)
 [place] EMERGENCY DEPARTMENT AT [facility] Provider Note   CSN: [id number] Arrival date & time: 04/06/24  1707     Patient presents with: Hematuria and Urinary Retention   Brandon [name] is a 78 y.o. male.    Hematuria     Patient has a history of acid reflux, hypertension basal cell carcinoma, traumatic brain injury indwelling Foley catheter.  Patient had an episode of urinary tension on November 12.  He had a catheter placed.  Patient followed up with urology and they were going to continue to monitor.  He presented back to the emergency room earlier this morning.  Patient noted that his catheter was clogged.  He is not on any blood thinners.  Patient had his catheter irrigated and the urine started to drain again.  Patient has continued to notice bloody drainage from the catheter.  He was concerned that it was starting to clog up again so he came to the ED.  He is not having fevers.  No vomiting  Prior to Admission medications   Medication Sig Start Date End Date Taking? Authorizing Provider  atorvastatin  (LIPITOR) 20 MG tablet Take 1 tablet (20 mg total) by mouth daily. 02/26/24   Nafziger, Darleene, NP  cetirizine (ZYRTEC) 10 MG tablet Take 10 mg by mouth daily.    [provider]  Cholecalciferol  25 MCG (1000 UT) tablet Take 1 tablet (1,000 Units total) by mouth daily. 01/22/22   Angiulli, Daniel J, PA-C  diclofenac  Sodium (VOLTAREN ) 1 % GEL Apply 4 g topically 3 (three) times daily. 01/22/22   Angiulli, Toribio PARAS, PA-C  fluorouracil (EFUDEX) 5 % cream Apply topically 2 (two) times daily. 08/01/23   [provider]  lisinopril -hydrochlorothiazide  (ZESTORETIC ) 20-25 MG tablet Take 1 tablet by mouth daily. 12/27/23   Nafziger, Darleene, NP  meclizine  (ANTIVERT ) 25 MG tablet Take 1 tablet (25 mg total) by mouth 3 (three) times daily as needed. 07/24/23   Nafziger, Darleene, NP  metFORMIN  (GLUCOPHAGE -XR) 500 MG 24 hr tablet Take 1 tablet (500 mg total) by mouth daily with  breakfast. 10/03/23   Nafziger, Darleene, NP  tamsulosin  (FLOMAX ) 0.4 MG CAPS capsule Take 1 capsule (0.4 mg total) by mouth daily. 03/25/24   Lenor Hollering, MD    Allergies: Patient has no known allergies.    Review of Systems  Genitourinary:  Positive for hematuria.    Updated Vital Signs BP 133/67   Pulse 78   Temp 98 F (36.7 C)   Resp 20   SpO2 100%   Physical Exam Vitals and nursing note reviewed.  Constitutional:      Appearance: He is well-developed.  HENT:     Head: Normocephalic and atraumatic.     Right Ear: External ear normal.     Left Ear: External ear normal.  Eyes:     General: No scleral icterus.       Right eye: No discharge.        Left eye: No discharge.     Conjunctiva/sclera: Conjunctivae normal.  Neck:     Trachea: No tracheal deviation.  Cardiovascular:     Rate and Rhythm: Normal rate and regular rhythm.  Pulmonary:     Effort: Pulmonary effort is normal. No respiratory distress.     Breath sounds: Normal breath sounds. No stridor. No wheezing or rales.  Abdominal:     General: Bowel sounds are normal. There is no distension.     Palpations: Abdomen is soft.     Tenderness:  There is no abdominal tenderness. There is no guarding or rebound.  Genitourinary:    Comments: Foley catheter bag shows blood-tinged urine, patient had 2 urinals at the bedside full of urine that was from the patient's Foley catheter bag Musculoskeletal:        General: No tenderness or deformity.     Cervical back: Neck supple.  Skin:    General: Skin is warm and dry.     Findings: No rash.  Neurological:     General: No focal deficit present.     Mental Status: He is alert.     Cranial Nerves: No cranial nerve deficit, dysarthria or facial asymmetry.     Sensory: No sensory deficit.     Motor: No abnormal muscle tone or seizure activity.     Coordination: Coordination normal.  Psychiatric:        Mood and Affect: Mood normal.     (all labs ordered are listed,  but only abnormal results are displayed) Labs Reviewed  CBC - Abnormal; Notable for the following components:      Result Value   WBC 10.9 (*)    All other components within normal limits  BASIC METABOLIC PANEL WITH GFR - Abnormal; Notable for the following components:   Glucose, Bld 166 (*)    All other components within normal limits    EKG: None  Radiology: No results found.   Procedures   Medications Ordered in the ED  sodium chloride  irrigation 0.9 % 1,000 mL (0 mLs Irrigation Stopped 04/06/24 1933)    Clinical Course as of 04/06/24 2029  Mon Apr 06, 2024  1904 CBC(!) Hemoglobin stable no anemia [JK]    Clinical Course User Index [JK] Randol Simmonds, MD                                 Medical Decision Making Problems Addressed: Hematuria, unspecified type: acute illness or injury that poses a threat to life or bodily functions  Amount and/or Complexity of Data Reviewed Labs: ordered. Decision-making details documented in ED Course.  Risk Prescription drug management.   Patient presented to the ED for evaluation of recurrent hematuria.  Patient started to notice some clots in the catheter where it was not draining properly.  Patient was irrigated here in the ED.  It cleared and the catheter is flowing easily.  Patient does not have any evidence of anemia.  His kidney function is stable.  No findings to suggest infection.  He appears appropriate for discharge with close outpatient follow-up with his urologist     Final diagnoses:  Hematuria, unspecified type    ED Discharge Orders     None          Randol Simmonds, MD 04/06/24 2029

## 2024-04-06 NOTE — Discharge Instructions (Signed)
 Continue to follow-up with your urologist as planned.  Return to the ER if you have recurrent issues with the catheter not draining properly, or if you start experiencing fevers chills or other concerning symptoms

## 2024-04-06 NOTE — ED Notes (Signed)
 Irrigation complete, pt tolerating appropriately, urine flow still noted in catheter

## 2024-04-06 NOTE — ED Triage Notes (Signed)
 Pt c/o hematuria w catheter, seen last night for same, just keeps clogging up w blood. Advises catheter has not drained at all for awhile, but unsure of time.

## 2024-04-13 ENCOUNTER — Ambulatory Visit: Payer: Medicare HMO

## 2024-04-13 VITALS — BP 122/64 | HR 100 | Temp 98.2°F | Ht 78.0 in | Wt 227.7 lb

## 2024-04-13 DIAGNOSIS — Z0000 Encounter for general adult medical examination without abnormal findings: Secondary | ICD-10-CM

## 2024-04-13 NOTE — Patient Instructions (Addendum)
 Brandon [name],  Thank you for taking the time for your Medicare Wellness Visit. I appreciate your continued commitment to your health goals. Please review the care plan we discussed, and feel free to reach out if I can assist you further.  Please note that Annual Wellness Visits do not include a physical exam. Some assessments may be limited, especially if the visit was conducted virtually. If needed, we may recommend an in-person follow-up with your provider.  Ongoing Care Seeing your primary care provider every 3 to 6 months helps us  monitor your health and provide consistent, personalized care.   Referrals If a referral was made during today's visit and you haven't received any updates within two weeks, please contact the referred provider directly to check on the status.  Recommended Screenings:  Health Maintenance  Topic Date Due   Eye exam for diabetics  07/29/2020   COVID-19 Vaccine (10 - 2025-26 season) 04/01/2024   Hemoglobin A1C  06/28/2024   Yearly kidney health urinalysis for diabetes  12/26/2024   Complete foot exam   12/26/2024   Yearly kidney function blood test for diabetes  04/06/2025   Medicare Annual Wellness Visit  04/13/2025   DTaP/Tdap/Td vaccine (3 - Td or Tdap) 12/25/2031   Pneumococcal Vaccine for age over 28  Completed   Flu Shot  Completed   Zoster (Shingles) Vaccine  Completed   Meningitis B Vaccine  Aged Out   Colon Cancer Screening  Discontinued   Hepatitis C Screening  Discontinued       04/13/2024    8:35 AM  Advanced Directives  Does Patient Have a Medical Advance Directive? No  Would patient like information on creating a medical advance directive? No - Patient declined    Vision: Annual vision screenings are recommended for early detection of glaucoma, cataracts, and diabetic retinopathy. These exams can also reveal signs of chronic conditions such as diabetes and high blood pressure.  Dental: Annual dental screenings help detect early signs of  oral cancer, gum disease, and other conditions linked to overall health, including heart disease and diabetes.  Please see the attached documents for additional preventive care recommendations.

## 2024-04-13 NOTE — Progress Notes (Signed)
 Chief Complaint  Patient presents with   Medicare Wellness     Subjective:   Brandon [name] is a 78 y.o. male who presents for a Medicare Annual Wellness Visit.  Allergies (verified) Patient has no known allergies.   History: Past Medical History:  Diagnosis Date   Allergy    Basal cell carcinoma    GERD (gastroesophageal reflux disease)    History of kidney cancer    benign; told not cancerous   Hypertension    Past Surgical History:  Procedure Laterality Date   CATARACT EXTRACTION Bilateral    CRANIOTOMY Bilateral 12/25/2021   Procedure: CRANIOTOMY HEMATOMA EVACUATION SUBDURAL;  Surgeon: Louis Shove, MD;  Location: [facility] OR;  Service: Neurosurgery;  Laterality: Bilateral;   HERNIA REPAIR     x2   KIDNEY SURGERY Right    Had right kidney removed.    TONSILLECTOMY     VASECTOMY     Family History  Problem Relation Age of Onset   Cancer Mother        Breast    Alzheimer's disease Mother    Cancer Father        Lung    Colon cancer Neg Hx    Social History   Occupational History   Not on file  Tobacco Use   Smoking status: Former   Smokeless tobacco: Never  Vaping Use   Vaping status: Never Used  Substance and Sexual Activity   Alcohol use: Not Currently    Comment: Beer every once in a while   Drug use: No   Sexual activity: Not Currently   Tobacco Counseling Counseling given: No  SDOH Screenings   Food Insecurity: No Food Insecurity (04/13/2024)  Housing: Unknown (04/13/2024)  Transportation Needs: No Transportation Needs (04/13/2024)  Utilities: Not At Risk (04/13/2024)  Alcohol Screen: Low Risk  (04/10/2023)  Depression (PHQ2-9): Low Risk  (04/13/2024)  Financial Resource Strain: Low Risk  (04/10/2023)  Physical Activity: Inactive (04/13/2024)  Social Connections: Moderately Isolated (04/13/2024)  Stress: No Stress Concern Present (04/13/2024)  Tobacco Use: Medium Risk (04/13/2024)  Health Literacy: Adequate Health Literacy (04/13/2024)   See  flowsheets for full screening details  Depression Screen PHQ 2 & 9 Depression Scale- Over the past 2 weeks, how often have you been bothered by any of the following problems? Little interest or pleasure in doing things: 0 Feeling down, depressed, or hopeless (PHQ Adolescent also includes...irritable): 0 PHQ-2 Total Score: 0     Goals Addressed               This Visit's Progress     Increase physical activity (pt-stated)         Visit info / Clinical Intake: Medicare Wellness Visit Type:: Subsequent Annual Wellness Visit Persons participating in visit:: patient Medicare Wellness Visit Mode:: In-person (required for WTM) Information given by:: patient Interpreter Needed?: No Pre-visit prep was completed: no AWV questionnaire completed by patient prior to visit?: no Living arrangements:: lives with spouse/significant other Patient's Overall Health Status Rating: very good Typical amount of pain: none Does pain affect daily life?: no Are you currently prescribed opioids?: no  Dietary Habits and Nutritional Risks How many meals a day?: 3 Eats fruit and vegetables daily?: yes Most meals are obtained by: preparing own meals In the last 2 weeks, have you had any of the following?: none Diabetic:: (!) yes Any non-healing wounds?: no How often do you check your BS?: as needed Would you like to be referred to a Nutritionist  or for Diabetic Management? : no  Functional Status Activities of Daily Living (to include ambulation/medication): Independent Ambulation: Independent with device- listed below Home Assistive Devices/Equipment: Eyeglasses; Walker (specify Type); Other (Comment) (Hearing Aids) Medication Administration: Independent Home Management: Independent Manage your own finances?: yes Primary transportation is: family/friends (Wife drives) Concerns about vision?: no *vision screening is required for WTM* Concerns about hearing?: (!) yes Uses hearing aids?: (!)  yes Hear whispered voice?: (!) no *in-person visit only* (Wears Hearing Aids)  Fall Screening Falls in the past year?: 1 Number of falls in past year: 0 Was there an injury with Fall?: 0 (Followed by medical attention) Fall Risk Category Calculator: 1 Patient Fall Risk Level: Low Fall Risk  Fall Risk Patient at Risk for Falls Due to: Mental status change Fall risk Follow up: Falls prevention discussed  Home and Transportation Safety: All rugs have non-skid backing?: yes All stairs or steps have railings?: N/A, no stairs Grab bars in the bathtub or shower?: yes Have non-skid surface in bathtub or shower?: yes Good home lighting?: yes Regular seat belt use?: yes Hospital stays in the last year:: no  Cognitive Assessment Difficulty concentrating, remembering, or making decisions? : no Will 6CIT or Mini Cog be Completed: no 6CIT or Mini Cog Declined: patient alert, oriented, able to answer questions appropriately and recall recent events  Advance Directives (For Healthcare) Does Patient Have a Medical Advance Directive?: No Would patient like information on creating a medical advance directive?: No - Patient declined  Reviewed/Updated  Reviewed/Updated: Reviewed All (Medical, Surgical, Family, Medications, Allergies, Care Teams, Patient Goals)        Objective:    Today's Vitals   04/13/24 0833  BP: 122/64  Pulse: 100  Temp: 98.2 F (36.8 C)  TempSrc: Oral  SpO2: 94%  Weight: 227 lb 11.2 oz (103.3 kg)  Height: 6' 6 (1.981 m)   Body mass index is 26.31 kg/m.  Current Medications (verified) Outpatient Encounter Medications as of 04/13/2024  Medication Sig   atorvastatin  (LIPITOR) 20 MG tablet Take 1 tablet (20 mg total) by mouth daily.   cetirizine (ZYRTEC) 10 MG tablet Take 10 mg by mouth daily.   Cholecalciferol  25 MCG (1000 UT) tablet Take 1 tablet (1,000 Units total) by mouth daily.   diclofenac  Sodium (VOLTAREN ) 1 % GEL Apply 4 g topically 3 (three) times  daily.   fluorouracil (EFUDEX) 5 % cream Apply topically 2 (two) times daily.   lisinopril -hydrochlorothiazide  (ZESTORETIC ) 20-25 MG tablet Take 1 tablet by mouth daily.   meclizine  (ANTIVERT ) 25 MG tablet Take 1 tablet (25 mg total) by mouth 3 (three) times daily as needed.   metFORMIN  (GLUCOPHAGE -XR) 500 MG 24 hr tablet Take 1 tablet (500 mg total) by mouth daily with breakfast.   tamsulosin  (FLOMAX ) 0.4 MG CAPS capsule Take 1 capsule (0.4 mg total) by mouth daily.   No facility-administered encounter medications on file as of 04/13/2024.   Hearing/Vision screen Hearing Screening - Comments:: Wears Hearing Aids Vision Screening - Comments:: Wears rx glasses - up to date with routine eye exams with  [facility] Immunizations and Health Maintenance Health Maintenance  Topic Date Due   OPHTHALMOLOGY EXAM  07/29/2020   COVID-19 Vaccine (10 - 2025-26 season) 04/01/2024   HEMOGLOBIN A1C  06/28/2024   Diabetic kidney evaluation - Urine ACR  12/26/2024   FOOT EXAM  12/26/2024   Diabetic kidney evaluation - eGFR measurement  04/06/2025   Medicare Annual Wellness (AWV)  04/13/2025   DTaP/Tdap/Td (3 -  Td or Tdap) 12/25/2031   Pneumococcal Vaccine: 50+ Years  Completed   Influenza Vaccine  Completed   Zoster Vaccines- Shingrix  Completed   Meningococcal B Vaccine  Aged Out   Colonoscopy  Discontinued   Hepatitis C Screening  Discontinued        Assessment/Plan:  This is a routine wellness examination for [facility].  Patient Care Team: Merna Huxley, NP as PCP - General (Family Medicine) Alvaro Ricardo KATHEE Raddle., MD as Consulting Physician (Urology)  I have personally reviewed and noted the following in the patient's chart:   Medical and social history Use of alcohol, tobacco or illicit drugs  Current medications and supplements including opioid prescriptions. Functional ability and status Nutritional status Physical activity Advanced directives List of other  physicians Hospitalizations, surgeries, and ER visits in previous 12 months Vitals Screenings to include cognitive, depression, and falls Referrals and appointments  No orders of the defined types were placed in this encounter.  In addition, I have reviewed and discussed with patient certain preventive protocols, quality metrics, and best practice recommendations. A written personalized care plan for preventive services as well as general preventive health recommendations were provided to patient.   Rojelio LELON Blush, LPN   [id number]/12/[id number]   Return in 1 year on 04/19/25  After Visit Summary: (In Person-Declined) Patient declined AVS at this time.  Nurse Notes: None

## 2024-04-15 DIAGNOSIS — R3914 Feeling of incomplete bladder emptying: Secondary | ICD-10-CM | POA: Diagnosis not present

## 2024-04-15 DIAGNOSIS — N401 Enlarged prostate with lower urinary tract symptoms: Secondary | ICD-10-CM | POA: Diagnosis not present

## 2025-04-19 ENCOUNTER — Ambulatory Visit
